# Patient Record
Sex: Female | Born: 1940 | Race: White | Hispanic: No | Smoking: Never smoker
Health system: Southern US, Community
[De-identification: ages and names within clinical notes are randomized; demographics above are authoritative.]

## PROBLEM LIST (undated history)

## (undated) DIAGNOSIS — Z170 Estrogen receptor positive status [ER+]: Principal | ICD-10-CM

## (undated) DIAGNOSIS — Z9189 Other specified personal risk factors, not elsewhere classified: Secondary | ICD-10-CM

## (undated) DIAGNOSIS — I639 Cerebral infarction, unspecified: Secondary | ICD-10-CM

## (undated) DIAGNOSIS — C50212 Malignant neoplasm of upper-inner quadrant of left female breast: Secondary | ICD-10-CM

## (undated) DIAGNOSIS — I10 Essential (primary) hypertension: Secondary | ICD-10-CM

## (undated) DIAGNOSIS — M1990 Unspecified osteoarthritis, unspecified site: Secondary | ICD-10-CM

## (undated) DIAGNOSIS — K219 Gastro-esophageal reflux disease without esophagitis: Secondary | ICD-10-CM

## (undated) DIAGNOSIS — E785 Hyperlipidemia, unspecified: Secondary | ICD-10-CM

## (undated) HISTORY — DX: Hyperlipidemia, unspecified: E78.5

## (undated) HISTORY — DX: Unspecified osteoarthritis, unspecified site: M19.90

## (undated) HISTORY — DX: Essential (primary) hypertension: I10

## (undated) HISTORY — DX: Other specified personal risk factors, not elsewhere classified: Z91.89

## (undated) HISTORY — DX: Cerebral infarction, unspecified: I63.9

## (undated) HISTORY — DX: Estrogen receptor positive status (ER+): Z17.0

## (undated) HISTORY — DX: Malignant neoplasm of upper-inner quadrant of left female breast: C50.212

---

## 1979-11-27 HISTORY — PX: CHOLECYSTECTOMY: SHX55

## 1998-11-26 LAB — HM MAMMOGRAPHY: HM Mammogram: NEGATIVE

## 2010-02-18 DIAGNOSIS — Z8673 Personal history of transient ischemic attack (TIA), and cerebral infarction without residual deficits: Secondary | ICD-10-CM | POA: Insufficient documentation

## 2010-02-20 ENCOUNTER — Encounter: Payer: Self-pay | Admitting: Internal Medicine

## 2010-02-20 ENCOUNTER — Inpatient Hospital Stay (HOSPITAL_COMMUNITY): Admission: EM | Admit: 2010-02-20 | Discharge: 2010-02-24 | Payer: Self-pay | Admitting: Emergency Medicine

## 2010-02-20 LAB — CONVERTED CEMR LAB: TSH: 1.943 microintl units/mL

## 2010-02-21 ENCOUNTER — Encounter (INDEPENDENT_AMBULATORY_CARE_PROVIDER_SITE_OTHER): Payer: Self-pay | Admitting: Internal Medicine

## 2010-02-21 ENCOUNTER — Ambulatory Visit: Payer: Self-pay | Admitting: Vascular Surgery

## 2010-02-21 ENCOUNTER — Encounter: Payer: Self-pay | Admitting: Internal Medicine

## 2010-02-21 LAB — CONVERTED CEMR LAB
BUN: 20 mg/dL
CO2: 28 meq/L
Calcium: 8.8 mg/dL
Chloride: 102 meq/L
Cholesterol: 295 mg/dL
Creatinine, Ser: 1.33 mg/dL
Glucose, Bld: 104 mg/dL
HCT: 36.9 %
HDL: 54 mg/dL
Hemoglobin: 13 g/dL
LDL Cholesterol: 203 mg/dL
MCV: 100.2 fL
Platelets: 172 10*3/uL
Potassium: 3.7 meq/L
RBC: 3.68 M/uL
RDW: 35.2 %
Sodium: 137 meq/L
Triglyceride fasting, serum: 188 mg/dL
WBC: 5.2 10*3/uL

## 2010-02-24 ENCOUNTER — Telehealth (INDEPENDENT_AMBULATORY_CARE_PROVIDER_SITE_OTHER): Payer: Self-pay | Admitting: *Deleted

## 2010-02-24 ENCOUNTER — Encounter: Payer: Self-pay | Admitting: Internal Medicine

## 2010-02-24 DIAGNOSIS — I639 Cerebral infarction, unspecified: Secondary | ICD-10-CM

## 2010-02-24 HISTORY — DX: Cerebral infarction, unspecified: I63.9

## 2010-02-24 LAB — CONVERTED CEMR LAB
ALT: 31 units/L
AST: 27 units/L
Albumin: 3.2 g/dL
Alkaline Phosphatase: 134 units/L
Total Bilirubin: 0.7 mg/dL
Total Protein: 6.2 g/dL

## 2010-03-01 ENCOUNTER — Encounter: Payer: Self-pay | Admitting: Interventional Radiology

## 2010-03-03 ENCOUNTER — Encounter: Payer: Self-pay | Admitting: Internal Medicine

## 2010-03-20 ENCOUNTER — Encounter: Payer: Self-pay | Admitting: Internal Medicine

## 2010-03-20 DIAGNOSIS — I10 Essential (primary) hypertension: Secondary | ICD-10-CM | POA: Insufficient documentation

## 2010-03-20 DIAGNOSIS — E785 Hyperlipidemia, unspecified: Secondary | ICD-10-CM | POA: Insufficient documentation

## 2010-03-21 ENCOUNTER — Ambulatory Visit: Payer: Self-pay | Admitting: Internal Medicine

## 2010-03-21 DIAGNOSIS — Z9189 Other specified personal risk factors, not elsewhere classified: Secondary | ICD-10-CM

## 2010-03-21 DIAGNOSIS — M129 Arthropathy, unspecified: Secondary | ICD-10-CM | POA: Insufficient documentation

## 2010-03-21 DIAGNOSIS — K279 Peptic ulcer, site unspecified, unspecified as acute or chronic, without hemorrhage or perforation: Secondary | ICD-10-CM | POA: Insufficient documentation

## 2010-03-21 HISTORY — DX: Other specified personal risk factors, not elsewhere classified: Z91.89

## 2010-04-21 ENCOUNTER — Ambulatory Visit: Payer: Self-pay | Admitting: Internal Medicine

## 2010-04-21 LAB — CONVERTED CEMR LAB
ALT: 132 units/L — ABNORMAL HIGH (ref 0–35)
AST: 93 units/L — ABNORMAL HIGH (ref 0–37)
Albumin: 3.8 g/dL (ref 3.5–5.2)
Alkaline Phosphatase: 185 units/L — ABNORMAL HIGH (ref 39–117)
Bilirubin, Direct: 0.1 mg/dL (ref 0.0–0.3)
Cholesterol: 224 mg/dL — ABNORMAL HIGH (ref 0–200)
Direct LDL: 69.9 mg/dL
HDL: 68.6 mg/dL (ref >39.00)
Total Bilirubin: 0.5 mg/dL (ref 0.3–1.2)
Total CHOL/HDL Ratio: 3
Total Protein: 6.8 g/dL (ref 6.0–8.3)
Triglycerides: 222 mg/dL — ABNORMAL HIGH (ref 0.0–149.0)
VLDL: 44.4 mg/dL — ABNORMAL HIGH (ref 0.0–40.0)

## 2010-05-11 ENCOUNTER — Ambulatory Visit: Payer: Self-pay | Admitting: Internal Medicine

## 2010-05-11 DIAGNOSIS — R740 Nonspecific elevation of levels of transaminase and lactic acid dehydrogenase [LDH]: Secondary | ICD-10-CM

## 2010-05-11 DIAGNOSIS — R7402 Elevation of levels of lactic acid dehydrogenase (LDH): Secondary | ICD-10-CM | POA: Insufficient documentation

## 2010-05-11 LAB — CONVERTED CEMR LAB
ALT: 77 units/L — ABNORMAL HIGH (ref 0–35)
AST: 46 units/L — ABNORMAL HIGH (ref 0–37)
Albumin: 4.2 g/dL (ref 3.5–5.2)
Alkaline Phosphatase: 187 units/L — ABNORMAL HIGH (ref 39–117)
Bilirubin, Direct: 0.1 mg/dL (ref 0.0–0.3)
Total Bilirubin: 0.5 mg/dL (ref 0.3–1.2)
Total Protein: 7 g/dL (ref 6.0–8.3)

## 2010-06-20 ENCOUNTER — Ambulatory Visit: Payer: Self-pay | Admitting: Internal Medicine

## 2010-06-20 ENCOUNTER — Encounter: Admission: RE | Admit: 2010-06-20 | Discharge: 2010-06-20 | Payer: Self-pay | Admitting: Internal Medicine

## 2010-06-20 LAB — CONVERTED CEMR LAB
ALT: 50 units/L — ABNORMAL HIGH (ref 0–35)
AST: 34 units/L (ref 0–37)
Albumin: 3.8 g/dL (ref 3.5–5.2)
Alkaline Phosphatase: 172 units/L — ABNORMAL HIGH (ref 39–117)
Bilirubin, Direct: 0.1 mg/dL (ref 0.0–0.3)
Cholesterol: 401 mg/dL — ABNORMAL HIGH (ref 0–200)
Direct LDL: 119.5 mg/dL
HDL: 66.2 mg/dL (ref >39.00)
Total Bilirubin: 0.6 mg/dL (ref 0.3–1.2)
Total CHOL/HDL Ratio: 6
Total Protein: 6.9 g/dL (ref 6.0–8.3)
Triglycerides: 424 mg/dL — ABNORMAL HIGH (ref 0.0–149.0)
VLDL: 84.8 mg/dL — ABNORMAL HIGH (ref 0.0–40.0)

## 2010-09-26 ENCOUNTER — Ambulatory Visit: Payer: Self-pay | Admitting: Internal Medicine

## 2010-09-26 DIAGNOSIS — R609 Edema, unspecified: Secondary | ICD-10-CM | POA: Insufficient documentation

## 2010-12-17 ENCOUNTER — Encounter: Payer: Self-pay | Admitting: Interventional Radiology

## 2010-12-26 NOTE — Assessment & Plan Note (Signed)
Summary: 3-4 MTH FU---STC   Vital Signs:  Patient profile:   70 year old female Height:      64.5 inches (163.83 cm) Weight:      157.4 pounds (71.55 kg) O2 Sat:      97 % on Room air Temp:     98.2 degrees F (36.78 degrees C) oral Pulse rate:   69 / minute BP sitting:   132 / 70  (left arm) Cuff size:   regular  Vitals Entered By: Tomma Lightning RMA (September 26, 2010 10:20 AM)  O2 Flow:  Room air CC: 3 month follow-up Is Patient Diabetic? No Pain Assessment Patient in pain? no      Comments Pt requesting a script for amlodipine 5mg    Primary Care Provider:  Rowe Clack MD  CC:  3 month follow-up.  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 reduced amlodipine due to new ankle edema in 05/2010 - improved but not resolved; otherwise, denies adverse side effects related to current therapy - does note freq nocturia (takes all meds at bedtime)  dyslipidemia -off medical treatment due to inc LFTs 03/2010 - see above - no GI or Mskel c/o  Clinical Review Panels:  Lipid Management   Cholesterol:  401 (06/20/2010)   LDL (bad choesterol):  203 (02/21/2010)   HDL (good cholesterol):  66.20 (06/20/2010)   Triglycerides:  188 (02/21/2010)  CBC  WBC:  5.2 (02/21/2010)   RBC:  3.68 (02/21/2010)   Hgb:  13.0 (02/21/2010)   Hct:  36.9 (02/21/2010)   Platelets:  172 (02/21/2010)   MCV  100.2 (02/21/2010)   RDW  35.2 (02/21/2010)  Complete Metabolic Panel   Glucose:  104 (02/21/2010)   Sodium:  137 (02/21/2010)   Potassium:  3.7 (02/21/2010)   Chloride:  102 (02/21/2010)   CO2:  28 (02/21/2010)   BUN:  20 (02/21/2010)   Creatinine:  1.33 (02/21/2010)   Albumin:  3.8 (06/20/2010)   Total Protein:  6.9 (06/20/2010)   Calcium:  8.8 (02/21/2010)   Total Bili:  0.6 (06/20/2010)   Alk Phos:  172 (06/20/2010)   SGPT (ALT):  50 (06/20/2010)   SGOT (AST):  34 (06/20/2010)   Current Medications (verified): 1)  Amlodipine Besylate 10 Mg Tabs (Amlodipine Besylate) .... Take 1/2 Tab By Mouth Once Daily 2)  Aspirin 81 Mg Tabs (Aspirin) .... Once Daily 3)  Plavix 75 Mg Tabs (Clopidogrel Bisulfate) .... Take 1 By Mouth Once Daily 4)  Diovan Hct 160-12.5 Mg Tabs (Valsartan-Hydrochlorothiazide) .... Take 1 By Mouth  Every Morning  Allergies (verified): 1)  ! 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    Physical Exam  General:  alert, well-developed, well-nourished, and cooperative to examination.   dtr at side Lungs:  normal respiratory effort, no intercostal retractions or use of accessory muscles; normal breath sounds bilaterally - no crackles and no wheezes.    Heart:  normal rate, regular rhythm, no murmur, and no rub. BLE with trace ankle/foot edema.     Impression & Recommendations:  Problem # 1:  EDEMA ([id number])  may be related to amlodipine - improved with reduced dose  ok to use furosemide as needed -new erx done -  also rec compression hose Her updated medication list for this problem includes:    Diovan Hct 160-12.5 Mg Tabs (Valsartan-hydrochlorothiazide) .Marland Kitchen... Take 1 by mouth  every morning    Furosemide 20 Mg Tabs (Furosemide) .Marland Kitchen... 1 by mouth once daily as needed for leg swelling  Discussed elevation of the legs, use of compression stockings, sodium restiction, and medication use.   Orders: Prescription Created Electronically [phone])  Problem # 2:  TRANSAMINASES, SERUM, ELEVATED (ICD-790.4)  Orders: TLB-Hepatic/Liver Function Pnl ([id number])  >3x rise in ast/alt between 02/24/10 and 04/21/10 recheck; improved 6/16 and 7/24 trend -  off simva since 04/21/10 due to same - no prior hx liver abn or symptoms - abd US7/2011: mild fatty liver, s/p chole, no other abn recheck labs next OV - offered and declines today if normal --rec to purse different statin..but pt feels she will decline med tx same. see next Time spent with patient/dtr 25 minutes, more than 50% of this time was spent counseling patient on lab trends, med effects and options for tx in light of CVA hx -  Problem # 3:  HYPERLIPIDEMIA (ICD-272.4)  plan recheck chol next OV reviewed change in lfts as above - off simva due to same - (note also on amlodipine in light of interval fda warnings since last OV) had marked improvement in LDL and total chol while on statin tx - reverse trend off meds reviewed. ?lovastatin or atorvastatin low dose trial warranted given recent CVA 02/2010 and need for aggressive secondary tx vs risk liver abn -  Labs Reviewed: SGOT: 27 (02/24/2010)   SGPT: 31 (02/24/2010)   HDL:54 (02/21/2010)  LDL:203 (02/21/2010)  Chol:295 (02/21/2010)  Trig:188 (02/21/2010)  Labs Reviewed: SGOT: 93 (04/21/2010)   SGPT: 132 (04/21/2010)   HDL:68.60 (04/21/2010), 54 (02/21/2010)  LDL: 68 (04/21/2010), 203 (02/21/2010),  Chol:224 (04/21/2010), 295 (02/21/2010)  Trig:222.0 (04/21/2010), 188 (02/21/2010)  Labs Reviewed: SGOT:  46 (05/11/2010)   SGPT: 77 (05/11/2010)   HDL:68.60 (04/21/2010), 54 (02/21/2010)  LDL:203 (02/21/2010)  Chol:224 (04/21/2010), 295 (02/21/2010)  Trig:222.0 (04/21/2010), 188 (02/21/2010)  Labs Reviewed: SGOT: 34 (06/20/2010)   SGPT: 50 (06/20/2010)   HDL:66.20 (06/20/2010), 68.60 (04/21/2010)  LDL:203 (02/21/2010)  Chol:401 (06/20/2010), 224 (04/21/2010)  Trig:424.0 Triglyceride is over 400; calculations on Lipids are invalid. mg/dL (06/20/2010), 222.0 (04/21/2010)  Problem # 4:  HYPERTENSION (ICD-401.9)  Her updated medication list for this problem includes:    Amlodipine Besylate 5 Mg Tabs (Amlodipine besylate) .Marland Kitchen... 1 by mouth once daily    Diovan Hct 160-12.5 Mg Tabs (Valsartan-hydrochlorothiazide) .Marland Kitchen... Take 1 by mouth  every morning    Furosemide 20 Mg Tabs (Furosemide) .Marland Kitchen... 1 by mouth once daily as needed for leg swelling  reduced amlodipine 05/2010 due to new edema -   BP today: 132/70 Prior BP: 128/62 (06/20/2010)  Labs Reviewed: K+: 3.7 (02/21/2010) Creat: : 1.33 (02/21/2010)   Chol: 401 (06/20/2010)   HDL: 66.20 (06/20/2010)   LDL: 203 (02/21/2010)   TG: 424.0 Triglyceride is over 400; calculations on Lipids are invalid. mg/dL (06/20/2010)  Complete Medication List: 1)  Amlodipine Besylate 5 Mg Tabs (Amlodipine besylate) .Marland Kitchen.. 1 by mouth once daily 2)  Aspirin 81 Mg Tabs (Aspirin) .... Once daily 3)  Plavix 75 Mg Tabs (Clopidogrel bisulfate) .... Take 1 by mouth once daily 4)  Diovan Hct 160-12.5 Mg Tabs (Valsartan-hydrochlorothiazide) .... Take 1 by mouth  every morning 5)  Claritin 10 Mg Tabs (Loratadine) .Marland Kitchen.. 1 by mouth once daily 6)  Furosemide 20 Mg Tabs (Furosemide) .Marland Kitchen.. 1 by mouth once daily as needed for leg swelling  Patient Instructions: 1)  it was good to see you today.  2)  tests and medications all reviewed today - copy of July labs given to you to revuiew cholesterol trend OFF cholesterol medications 3)  continue amlodipine 5mg  daily and use furosemide as  needed for ankle swelling - your prescriptions + refills have been electronically submitted to your pharmacy. Please take as directed. Contact our office if you believe you're having problems with the medication(s). 4)  also recommend using compression hose for leg and ankle swelling, especially during travel 5)  Please keep follow-up appointment 3-4 months, sooner if problems. will check liver tests and cholesterol  next visit so come fasting Prescriptions: PLAVIX 75 MG TABS (CLOPIDOGREL BISULFATE) take 1 by mouth once daily  #90 x 3   Entered and Authorized by:   Rowe Clack MD   Signed by:   Rowe Clack MD on 09/26/2010   Method used:   Electronically to        [facility] (retail)       [address] [place]  [address]       Ph: [phone]       Fax: [phone]   RxID:   [id number] FUROSEMIDE 20 MG TABS (FUROSEMIDE) 1 by mouth once daily as needed for leg swelling  #30 x 1   Entered and Authorized by:   Rowe Clack MD   Signed by:   Rowe Clack MD on 09/26/2010   Method used:   Electronically to        [facility] (retail)       [address] [place] [address]  [address]       Ph: [phone]       Fax: [phone]   RxID:   [id number] AMLODIPINE BESYLATE 5 MG TABS (AMLODIPINE BESYLATE) 1 by mouth once daily  #90 x 3   Entered and Authorized by:   Rowe Clack MD   Signed by:   Rowe Clack MD on 09/26/2010   Method used:   Electronically to        [facility] (retail)       [address] [place] [address]  [address]       Ph: [phone]       Fax: [phone]   RxID:   [id number]    Orders Added: 1)  Est. Patient Level IV [id number] 2)  Prescription Created Electronically [phone]

## 2010-12-26 NOTE — Assessment & Plan Note (Signed)
Summary: NEW MEDICARE PT--PKG--POST HOSP-DISCH'D 02/24/10-STROKE--STC   Vital Signs:  Patient profile:   70 year old female Height:      64.5 inches (163.83 cm) Weight:      152.2 pounds (69.18 kg) BMI:     25.81 O2 Sat:      95 % on Room air Temp:     97.4 degrees F (36.33 degrees C) oral Pulse rate:   70 / minute BP sitting:   136 / 68  (left arm) Cuff size:   regular  Vitals Entered By: Tomma Lightning (March 21, 2010 10:49 AM)  O2 Flow:  Room air CC: New patient/ D/C from hops 02/24/10 Is Patient Diabetic? No Pain Assessment Patient in pain? no        Primary Care Provider:  Rowe Clack MD  CC:  New patient/ D/C from hops 02/24/10.  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 denies adverse side effects related to current therapy does note freq nocturia (takes allmeds at bedtime).   3) dyslipidemia -reports compliance with ongoing medical treatment and no changes in medication dose or frequency. denies adverse side effects related to current therapy. no GI or Mskel c/o  Preventive Screening-Counseling & Management  Alcohol-Tobacco     Alcohol drinks/day: 0     Alcohol Counseling: not indicated; patient does not drink     Smoking Status: never     Tobacco Counseling: not indicated; no tobacco use  Caffeine-Diet-Exercise     Caffeine Counseling: not indicated; caffeine use is not excessive or problematic     Nutrition Referrals: no     Does Patient Exercise: yes     Type  of exercise: dance, gym, gardening     Exercise Counseling: not indicated; exercise is adequate     Depression Counseling: not indicated; screening negative for depression  Safety-Violence-Falls     Seat Belt Counseling: not indicated; patient wears seat belts     Helmet Counseling: not indicated; patient wears helmet when riding bicycle/motocycle     Firearms in the Home: no firearms in the home     Firearm Counseling: not applicable     Smoke Detector Counseling: no     Violence Counseling: not indicated; no violence risk noted     Fall Risk Counseling: not indicated; no significant falls noted  Clinical Review Panels:  Prevention   Last Mammogram:  No specific mammographic evidence of malignancy.   (11/26/1998)  Lipid Management   Cholesterol:  295 (02/21/2010)   LDL (bad choesterol):  203 (02/21/2010)   HDL (good cholesterol):  54 (02/21/2010)   Triglycerides:  188 (02/21/2010)  CBC   WBC:  5.2 (02/21/2010)   RBC:  3.68 (02/21/2010)   Hgb:  13.0 (02/21/2010)   Hct:  36.9 (02/21/2010)   Platelets:  172 (02/21/2010)   MCV  100.2 (02/21/2010)   RDW  35.2 (02/21/2010)  Complete Metabolic Panel   Glucose:  104 (02/21/2010)   Sodium:  137 (02/21/2010)   Potassium:  3.7 (02/21/2010)   Chloride:  102 (02/21/2010)   CO2:  28 (02/21/2010)   BUN:  20 (02/21/2010)   Creatinine:  1.33 (02/21/2010)   Albumin:  3.2 (02/24/2010)   Total Protein:  6.2 (02/24/2010)   Calcium:  8.8 (02/21/2010)   Total Bili:  0.7 (02/24/2010)   Alk Phos:  134 (02/24/2010)   SGPT (ALT):  31 (02/24/2010)   SGOT (AST):  27 (02/24/2010)   Current Medications (verified): 1)  Amlodipine Besylate 10 Mg Tabs (Amlodipine Besylate) .... Take 1 By Mouth Qd 2)  Aspirin 81 Mg Tabs (Aspirin) .... Once Daily 3)  Plavix 75 Mg Tabs (Clopidogrel Bisulfate) .... Take 1 By Mouth Qd 4)  Zocor 40 Mg Tabs (Simvastatin) .... Take 1 At Bedtime 5)  Diovan Hct 160-12.5 Mg Tabs (Valsartan-Hydrochlorothiazide) .... 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 I have reviewed all other systems and they were negative.   Physical Exam  General:  alert, well-developed, well-nourished, and cooperative to examination.   dtr and friend at side Eyes:  vision grossly intact; pupils equal, round and reactive to light.  conjunctiva and lids normal.   wears glasses Ears:  normal pinnae bilaterally, without erythema, swelling, or tenderness to palpation. TMs clear, without effusion, or cerumen impaction. Hearing grossly normal bilaterally  Mouth:  teeth and gums in good repair (upper partial); mucous membranes moist, without lesions or ulcers. oropharynx clear without exudate, no erythema.  Neck:  supple, full ROM, no masses, no thyromegaly; no thyroid nodules or tenderness. no JVD or carotid bruits.   Lungs:  normal respiratory effort, no intercostal retractions or use of accessory muscles; normal breath sounds bilaterally - no crackles and no wheezes.    Heart:  normal rate, regular rhythm, no murmur, and no rub. BLE without edema. normal DP pulses and normal cap refill in all 4 extremities    Abdomen:  soft, non-tender, normal bowel sounds, no distention; no masses and no appreciable hepatomegaly or splenomegaly.     Genitalia:  defer  Msk:  diffuse PIP OA changes of B hands - no acute effusions Neurologic:  alert & oriented X3 and cranial nerves II-XII symetrically intact.  strength normal in all extremities, sensation intact to light touch, and gait normal. speech fluent without dysarthria or aphasia; follows commands with good comprehension.  Skin:  no rashes, vesicles, ulcers, or erythema. No nodules or irregularity to palpation.  Psych:  Oriented X3, memory intact for recent and remote, normally interactive, good eye contact, not anxious appearing, not depressed appearing, and not agitated.      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 Once daily    Plavix 75 Mg Tabs (Clopidogrel bisulfate) .Marland Kitchen... Take 1 by mouth once daily  Problem # 2:  HYPERTENSION (ICD-401.9)  home BP log reviewed - good control change timing of diuretic containing med to AM due to nocturia symptoms -  Her updated medication list for this problem includes:    Amlodipine Besylate 10 Mg Tabs (Amlodipine besylate) .Marland Kitchen... Take 1 by mouth once daily    Diovan Hct 160-12.5 Mg Tabs (Valsartan-hydrochlorothiazide) .Marland Kitchen... Take 1 by mouth  every morning  BP today: 136/68  Labs Reviewed: K+: 3.7 (02/21/2010) Creat: : 1.33 (02/21/2010)   Chol: 295 (02/21/2010)   HDL: 54 (02/21/2010)   LDL: 203 (02/21/2010)   TG: 188 (02/21/2010)  Problem  # 3:  HYPERLIPIDEMIA (ICD-272.4) will recheck FLP and LFTs in 2-6 weeks given recent initiation of new meds to monitor for effects of tx hold fish oil at this time as discussed Her updated medication list for this problem includes:    Zocor 40 Mg Tabs (Simvastatin) .Marland Kitchen... Take 1 at bedtime  Labs Reviewed: SGOT: 27 (02/24/2010)   SGPT: 31 (02/24/2010)   HDL:54 (02/21/2010)  LDL:203 (02/21/2010)  Chol:295 (02/21/2010)  Trig:188 (02/21/2010)  Complete Medication List: 1)  Amlodipine Besylate 10 Mg Tabs (Amlodipine besylate) .... Take 1 by mouth once daily 2)  Aspirin 81 Mg Tabs (Aspirin) .... Once daily 3)  Plavix 75 Mg Tabs (Clopidogrel bisulfate) .... Take 1 by mouth once daily 4)  Zocor 40 Mg Tabs (Simvastatin) .... Take 1 at bedtime 5)  Diovan Hct 160-12.5 Mg Tabs (Valsartan-hydrochlorothiazide) .... Take 1 by mouth  every morning  Contraindications/Deferment of Procedures/Staging:    Test/Procedure: Colonoscopy    Reason for deferment: patient declined     Test/Procedure: Mammogram    Reason for deferment: patient declined     Test/Procedure: PAP Smear    Reason for deferment: patient declined     Test/Procedure: FLU VAX    Reason for deferment: patient declined     Test/Procedure: Pneumovax vaccine    Reason for deferment: patient declined     Test/Procedure: Zoster vaccine    Reason for deferment: declined   Patient Instructions: 1)  it was good to see you today.  2)  hospital course, tests and medications all reviewed today 3)  no medication changes but would take Diovan HCT in AM - 4)  90d refills sent on all medications (except diovan HCT) as requested 5)  Return in AM fasting for labs only in 4 weeks: lipids (272.4) and LFTs (v58.69) - your results will be posted on the phone tree for review in 48-72 hours from the time of test completion; call [phone] and enter your 9 digit MRN (listed above on this page, just below your name); if any changes need to be made or  there are abnormal results, you will be contacted directly.  6)  Please schedule a follow-up appointment in 3-4 months for recheck, sooner if problems.  Prescriptions: ZOCOR 40 MG TABS (SIMVASTATIN) take 1 at bedtime  #90 x 3   Entered and Authorized  by:   Rowe Clack MD   Signed by:   Rowe Clack MD on 03/21/2010   Method used:   Electronically to        [id number] * (retail)       [place]  [address]       Ph: [phone]       Fax: [phone]   RxID:   [id number] PLAVIX 75 MG TABS (CLOPIDOGREL BISULFATE) take 1 by mouth once daily  #90 x 3   Entered and Authorized by:   Rowe Clack MD   Signed by:   Rowe Clack MD on 03/21/2010   Method used:   Electronically to        [id number] * ([place]ail)       [place]  [address]       Ph: [phone]       Fax: [phone]   [id number] AMLODIPINE BESYLATE 10 MG TABS (AMLODIPINE BESYLATE) take 1 by mouth once daily  #90 x 3   Entered and Authorized by:   Rowe Clack MD   Signed by:   Rowe Clack MD on 03/21/2010   Method used:   Electronically to        [id number] * (retail)       [place]  [address]       Ph: [phone]       Fax: [phone]   [id number]    Mammogram  Procedure date:  11/26/1998  Findings:      No specific mammographic evidence of malignancy.

## 2010-12-26 NOTE — Assessment & Plan Note (Signed)
Summary: 3-4 MTH FU  STC   Vital Signs:  Patient profile:   70 year old female Height:      64.5 inches (163.83 cm) Weight:      155.4 pounds (70.64 kg) O2 Sat:      96 % on Room air Temp:     97.8 degrees F (36.56 degrees C) oral Pulse rate:   52 / minute BP sitting:   128 / 62  (left arm) Cuff size:   regular  Vitals Entered By: Tomma Lightning RMA (June 20, 2010 10:42 AM)  O2 Flow:  Room air CC: 3 month follow-up Is Patient Diabetic? No Pain Assessment Patient in pain? no        Primary Care Provider:  Rowe Clack MD  CC:  3 month follow-up.  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 reports new ankle edema in past 6 weeks - otherwise, denies adverse side effects related to current therapy - does note freq nocturia (takes all meds at bedtime).   dyslipidemia -off medical treatment due to inc LFTs - see above - no GI or Mskel c/o  Clinical Review Panels:  Lipid Management   Cholesterol:  224 (04/21/2010)   LDL (bad choesterol):  203 (02/21/2010)   HDL (good cholesterol):  68.60 (04/21/2010)   Triglycerides:  188 (02/21/2010)  CBC   WBC:  5.2 (02/21/2010)   RBC:  3.68 (02/21/2010)   Hgb:  13.0 (02/21/2010)   Hct:  36.9 (02/21/2010)   Platelets:  172 (02/21/2010)   MCV  100.2 (02/21/2010)   RDW  35.2  (02/21/2010)  Complete Metabolic Panel   Glucose:  104 (02/21/2010)   Sodium:  137 (02/21/2010)   Potassium:  3.7 (02/21/2010)   Chloride:  102 (02/21/2010)   CO2:  28 (02/21/2010)   BUN:  20 (02/21/2010)   Creatinine:  1.33 (02/21/2010)   Albumin:  4.2 (05/11/2010)   Total Protein:  7.0 (05/11/2010)   Calcium:  8.8 (02/21/2010)   Total Bili:  0.5 (05/11/2010)   Alk Phos:  187 (05/11/2010)   SGPT (ALT):  77 (05/11/2010)   SGOT (AST):  46 (05/11/2010)   Current Medications (verified): 1)  Amlodipine Besylate 10 Mg Tabs (Amlodipine Besylate) .... Take 1 By Mouth Once Daily 2)  Aspirin 81 Mg Tabs (Aspirin) .... Once Daily 3)  Plavix 75 Mg Tabs (Clopidogrel Bisulfate) .... Take 1 By Mouth Once Daily 4)  Diovan Hct 160-12.5 Mg Tabs (Valsartan-Hydrochlorothiazide) .... Take 1 By Mouth  Every Morning  Allergies (verified): 1)  ! Zocor (Simvastatin)  Past History:  Past Medical History: Hyperlipidemia Hypertension Peptic ulcer disease, remote Cerebrovascular accident, - R occipial 02/20/2010 - no residual deficits   MD roster: neuro - sethi  Review of Systems       The  patient complains of peripheral edema.  The patient denies vision loss, chest pain, and headaches.    Physical Exam  General:  alert, well-developed, well-nourished, and cooperative to examination.   dtr at side Lungs:  normal respiratory effort, no intercostal retractions or use of accessory muscles; normal breath sounds bilaterally - no crackles and no wheezes.    Heart:  normal rate, regular rhythm, no murmur, and no rub. BLE with 1+ ankle/foot edema.   Neurologic:  alert & oriented X3 and cranial nerves II-XII symetrically intact.  strength normal in all extremities, sensation intact to light touch, and gait normal. speech fluent without dysarthria or aphasia; follows commands with good comprehension.  Psych:  Oriented X3, memory intact for recent and remote, normally interactive, good eye contact, not  anxious appearing, not depressed appearing, and not agitated.      Impression & Recommendations:  Problem # 1:  TRANSAMINASES, SERUM, ELEVATED (ICD-790.4)  Orders: TLB-Hepatic/Liver Function Pnl ([id number])  >3x rise in ast/alt between 02/24/10 and 04/21/10 recheck; improved 6/16 trend -  off simva since 5/27 due to same - no prior hx liver abn or symptoms - abd [place] today - mild fatty liver, s/p chole, no other abn recheck labs today if normal -- likely purse different statin... see next Time spent with patient/dtr 25 minutes, more than 50% of this time was spent counseling patient on lab changes, med effects and options for tx in light of recent CVA -  Problem # 2:  HYPERLIPIDEMIA (ICD-272.4)  Orders: TLB-Lipid Panel (80061-LIPID)  change in lfts as above - off simva due to same - (note also on amlodipine in light of interval fda warnings since last OV) had marked improvement in LDL and total chol while on statin tx. ?lovastatin or atorvastatin low dose trial warranted given recent CVA 02/2010 and need for aggressive secondary tx vs risk liver abn -  Labs Reviewed: SGOT: 27 (02/24/2010)   SGPT: 31 (02/24/2010)   HDL:54 (02/21/2010)  LDL:203 (02/21/2010)  Chol:295 (02/21/2010)  Trig:188 (02/21/2010)  Labs Reviewed: SGOT: 93 (04/21/2010)   SGPT: 132 (04/21/2010)   HDL:68.60 (04/21/2010), 54 (02/21/2010)  LDL: 68 (04/21/2010), 203 (02/21/2010),  Chol:224 (04/21/2010), 295 (02/21/2010)  Trig:222.0 (04/21/2010), 188 (02/21/2010)  Labs Reviewed: SGOT: 46 (05/11/2010)   SGPT: 77 (05/11/2010)   HDL:68.60 (04/21/2010), 54 (02/21/2010)  LDL:203 (02/21/2010)  Chol:224 (04/21/2010), 295 (02/21/2010)  Trig:222.0 (04/21/2010), 188 (02/21/2010)  Problem # 3:  HYPERTENSION (ICD-401.9)  reduce amlodipine due to new edema -  Her updated medication list for this problem includes:    Amlodipine Besylate 10 Mg Tabs (Amlodipine besylate) .Marland Kitchen... Take 1/2 tab by mouth once daily    Diovan Hct  160-12.5 Mg Tabs (Valsartan-hydrochlorothiazide) .Marland Kitchen... Take 1 by mouth  every morning  BP today: 128/62 Prior BP: 120/62 (05/11/2010)  Labs Reviewed: K+: 3.7 (02/21/2010) Creat: : 1.33 (02/21/2010)   Chol: 224 (04/21/2010)   HDL: 68.60 (04/21/2010)   LDL: 203 (02/21/2010)   TG: 222.0 (04/21/2010)  Complete Medication List: 1)  Amlodipine Besylate 10 Mg Tabs (Amlodipine besylate) .... Take 1/2 tab by mouth once daily 2)  Aspirin 81 Mg Tabs (Aspirin) .... Once daily 3)  Plavix 75 Mg Tabs (Clopidogrel bisulfate) .... Take 1 by mouth once daily 4)  Diovan Hct 160-12.5 Mg Tabs (Valsartan-hydrochlorothiazide) .... Take 1 by mouth  every morning  Patient Instructions: 1)  it was good to see you today.  2)  tests and medications all reviewed today 3)  reduce amlodipine by 1/2 - take 5mg   daily to help reduce ankle swelling 4)  labs -liver tests and cholesterol today - you will be contacted directly with these results and plans for treatment advise based on these results.  5)  Please keep follow-up appointment 3-4 months, sooner if problems.  Prescriptions: DIOVAN HCT 160-12.5 MG TABS (VALSARTAN-HYDROCHLOROTHIAZIDE) take 1 by mouth  every morning  #90 x 2   Entered and Authorized by:   Rowe Clack MD   Signed by:   Rowe Clack MD on 06/20/2010   Method used:   Electronically to        [facility] (retail)       [address] [place]  [address]       Ph: [phone]       Fax: [phone]   RxID:   [id number]

## 2010-12-26 NOTE — Letter (Signed)
Summary: [facility]  [facility]   Imported By: Bubba Hales 03/23/2010 09:59:30  _____________________________________________________________________  External Attachment:    Type:   Image     Comment:   External Document

## 2010-12-26 NOTE — Progress Notes (Signed)
----   Converted from flag ---- ---- 02/24/2010 3:55 PM, Rowe Clack MD wrote: ok to work in sooner (week after next?) -  thanks Gave pt appt:  03/08/10 @ 1p w/Dr Leschber--phone/dau/Barbara ---- 02/24/2010 3:11 PM, Lucienne Capers wrote: Dr Asa Lente, June Leap is a new medicare/stroke- post hosp pt of yours that need to be seen in 2-3 wks.  I put  her in 04/13/10 which was your next availability.  Pt is requesting sooner because of the 2-3 wks post hosp fu.  Her hospital doctors referred her to you.  Do you want her work in sooner.  Thank you for your reply. ------------------------------

## 2010-12-26 NOTE — Assessment & Plan Note (Signed)
Summary: per dahlia sched-lab fu--stc   Vital Signs:  Patient profile:   70 year old female Height:      64.5 inches (163.83 cm) Weight:      152.8 pounds (69.45 kg) O2 Sat:      96 % on Room air Temp:     97.2 degrees F (36.22 degrees C) oral Pulse rate:   66 / minute BP sitting:   120 / 62  (left arm) Cuff size:   regular  Vitals Entered By: Tomma Lightning (May 11, 2010 10:53 AM)  O2 Flow:  Room air CC: f/u on labs Is Patient Diabetic? No Pain Assessment Patient in pain? no        Primary Care Provider:  Rowe Clack MD  CC:  f/u on labs.  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 denies adverse side effects related to current therapy does note freq nocturia (takes all meds at bedtime).   3) dyslipidemia -reports compliance with ongoing medical treatment and no changes in medication dose or frequency. denies adverse side effects related to current therapy. no GI or Mskel c/o  Clinical Review Panels:  Lipid Management   Cholesterol:  224 (04/21/2010)   LDL (bad choesterol):  203 (02/21/2010)   HDL (good cholesterol):  68.60 (04/21/2010)   Triglycerides:  188 (02/21/2010)  CBC   WBC:  5.2 (02/21/2010)   RBC:  3.68 (02/21/2010)   Hgb:  13.0 (02/21/2010)   Hct:  36.9 (02/21/2010)   Platelets:  172  (02/21/2010)   MCV  100.2 (02/21/2010)   RDW  35.2 (02/21/2010)  Complete Metabolic Panel   Glucose:  104 (02/21/2010)   Sodium:  137 (02/21/2010)   Potassium:  3.7 (02/21/2010)   Chloride:  102 (02/21/2010)   CO2:  28 (02/21/2010)   BUN:  20 (02/21/2010)   Creatinine:  1.33 (02/21/2010)   Albumin:  3.8 (04/21/2010)   Total Protein:  6.8 (04/21/2010)   Calcium:  8.8 (02/21/2010)   Total Bili:  0.5 (04/21/2010)   Alk Phos:  185 (04/21/2010)   SGPT (ALT):  132 (04/21/2010)   SGOT (AST):  93 (04/21/2010)   Current Medications (verified): 1)  Amlodipine Besylate 10 Mg Tabs (Amlodipine Besylate) .... Take 1 By Mouth Once Daily 2)  Aspirin 81 Mg Tabs (Aspirin) .... Once Daily 3)  Plavix 75 Mg Tabs (Clopidogrel Bisulfate) .... Take 1 By Mouth Once Daily 4)  Zocor 40 Mg Tabs (Simvastatin) .... Take 1 At Bedtime -Hold 5)  Diovan Hct 160-12.5 Mg Tabs (Valsartan-Hydrochlorothiazide) .... Take 1 By Mouth  Every Morning  Allergies (verified): 1)  ! Zocor (Simvastatin)  Past History:  Past Medical History: Hyperlipidemia Hypertension Peptic ulcer disease, remote Cerebrovascular accident, - R occipial 02/20/2010 - no residual deficits  MD roster: neuro -  sethi  Review of Systems  The patient denies anorexia, chest pain, headaches, abdominal pain, muscle weakness, and difficulty walking.    Physical Exam  General:  alert, well-developed, well-nourished, and cooperative to examination.   dtr at side Lungs:  normal respiratory effort, no intercostal retractions or use of accessory muscles; normal breath sounds bilaterally - no crackles and no wheezes.    Heart:  normal rate, regular rhythm, no murmur, and no rub. BLE without edema.   Abdomen:  soft, non-tender, normal bowel sounds, no distention; no masses and no appreciable hepatomegaly or splenomegaly.   Neurologic:  alert & oriented X3 and cranial nerves II-XII symetrically intact.  strength normal in all extremities, sensation  intact to light touch, and gait normal. speech fluent without dysarthria or aphasia; follows commands with good comprehension.    Impression & Recommendations:  Problem # 1:  TRANSAMINASES, SERUM, ELEVATED (ICD-790.4) >3x rise in ast/alt between 02/24/10 and 04/21/10 recheck -  now off simva due to same - no prior hx liver abn or symptoms  recheck today if elev, pursue further eval if normal -- likely purse different statin... 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 Once daily    Plavix 75 Mg Tabs (Clopidogrel bisulfate) .Marland Kitchen... 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 Take 1 at bedtime -hold  change in lfts as above - off simva - but had marked improvement in LDL and total chol while on statin tx. ?lovastatin or atorvastatin low dose trial warranted given recent CVA and need for aggressive secondary tx vs risk liver abn - ok to take omega 3 supplement at this time as discussed  Labs Reviewed: SGOT: 27 (02/24/2010)   SGPT: 31 (02/24/2010)    HDL:54 (02/21/2010)  LDL:203 (02/21/2010)  Chol:295 (02/21/2010)  Trig:188 (02/21/2010)  Labs Reviewed: SGOT: 93 (04/21/2010)   SGPT: 132 (04/21/2010)   HDL:68.60 (04/21/2010), 54 (02/21/2010)  LDL: 68 (04/21/2010), 203 (02/21/2010),  Chol:224 (04/21/2010), 295 (02/21/2010)  Trig:222.0 (04/21/2010), 188 (02/21/2010)  Complete Medication List: 1)  Amlodipine Besylate 10 Mg Tabs (Amlodipine besylate) .... Take 1 by mouth once daily 2)  Aspirin 81 Mg Tabs (Aspirin) .... Once daily 3)  Plavix 75 Mg Tabs (Clopidogrel bisulfate) .... Take 1 by mouth once daily 4)  Diovan Hct 160-12.5 Mg Tabs (Valsartan-hydrochlorothiazide) .... Take 1 by mouth  every morning  Patient Instructions: 1)  it was good to see you today.  2)  tests and medications all reviewed today 3)  stop simvastatin and do not take again  4)  labs -liver tests today - you will be contacted directly with these results and plans for treatment advise based on these results.  5)  Please keep follow-up appointmentas previously scheduled, sooner if problems.

## 2011-02-06 ENCOUNTER — Ambulatory Visit (INDEPENDENT_AMBULATORY_CARE_PROVIDER_SITE_OTHER): Payer: Medicare HMO | Admitting: Internal Medicine

## 2011-02-06 ENCOUNTER — Other Ambulatory Visit: Payer: Self-pay | Admitting: Internal Medicine

## 2011-02-06 ENCOUNTER — Encounter: Payer: Self-pay | Admitting: Internal Medicine

## 2011-02-06 ENCOUNTER — Other Ambulatory Visit: Payer: Medicare HMO

## 2011-02-06 DIAGNOSIS — R7402 Elevation of levels of lactic acid dehydrogenase (LDH): Secondary | ICD-10-CM

## 2011-02-06 DIAGNOSIS — E785 Hyperlipidemia, unspecified: Secondary | ICD-10-CM

## 2011-02-06 DIAGNOSIS — I10 Essential (primary) hypertension: Secondary | ICD-10-CM

## 2011-02-06 DIAGNOSIS — R740 Nonspecific elevation of levels of transaminase and lactic acid dehydrogenase [LDH]: Secondary | ICD-10-CM

## 2011-02-06 LAB — HEPATIC FUNCTION PANEL
ALT: 38 U/L — ABNORMAL HIGH (ref 0–35)
AST: 28 U/L (ref 0–37)
Albumin: 4 g/dL (ref 3.5–5.2)
Alkaline Phosphatase: 140 U/L — ABNORMAL HIGH (ref 39–117)
Bilirubin, Direct: 0 mg/dL (ref 0.0–0.3)
Total Bilirubin: 0.6 mg/dL (ref 0.3–1.2)
Total Protein: 6.9 g/dL (ref 6.0–8.3)

## 2011-02-06 LAB — LIPID PANEL
Cholesterol: 364 mg/dL — ABNORMAL HIGH (ref 0–200)
HDL: 61.5 mg/dL (ref >39.00)
Total CHOL/HDL Ratio: 6
Triglycerides: 265 mg/dL — ABNORMAL HIGH (ref 0.0–149.0)
VLDL: 53 mg/dL — ABNORMAL HIGH (ref 0.0–40.0)

## 2011-02-06 LAB — LDL CHOLESTEROL, DIRECT: Direct LDL: 149.9 mg/dL

## 2011-02-13 NOTE — Assessment & Plan Note (Signed)
Summary: 4 MTH FU/# CD   Vital Signs:  Patient profile:   70 year old female Height:      64.5 inches Weight:      150.75 pounds BMI:     25.57 O2 Sat:      96 % on Room air Temp:     97.6 degrees F oral Pulse rate:   62 / minute BP sitting:   118 / 78  (left arm) Cuff size:   regular  Vitals Entered By: Crissie Sickles, CMA (February 06, 2011 9:23 AM)  O2 Flow:  Room air CC: 4 mth follow up/DBD   Primary Care Provider:  Rowe Clack MD  CC:  4 mth follow up/DBD.  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 reduced amlodipine due to new ankle edema in 05/2010 - improved but not resolved; otherwise, denies adverse side effects related to current therapy - does note freq nocturia (takes all meds at bedtime)  dyslipidemia -off medical treatment due to inc LFTs 03/2010 - see above - no GI or Mskel c/o - working on diet, exercise and weight loss  Clinical Review Panels:  Lipid Management   Cholesterol:  401 (06/20/2010)   LDL (bad choesterol):  203 (02/21/2010)   HDL (good cholesterol):  66.20 (06/20/2010)   Triglycerides:  188 (02/21/2010)  CBC   WBC:  5.2 (02/21/2010)   RBC:  3.68 (02/21/2010)   Hgb:  13.0 (02/21/2010)   Hct:  36.9  (02/21/2010)   Platelets:  172 (02/21/2010)   MCV  100.2 (02/21/2010)   RDW  35.2 (02/21/2010)  Complete Metabolic Panel   Glucose:  104 (02/21/2010)   Sodium:  137 (02/21/2010)   Potassium:  3.7 (02/21/2010)   Chloride:  102 (02/21/2010)   CO2:  28 (02/21/2010)   BUN:  20 (02/21/2010)   Creatinine:  1.33 (02/21/2010)   Albumin:  3.8 (06/20/2010)   Total Protein:  6.9 (06/20/2010)   Calcium:  8.8 (02/21/2010)   Total Bili:  0.6 (06/20/2010)   Alk Phos:  172 (06/20/2010)   SGPT (ALT):  50 (06/20/2010)   SGOT (AST):  34 (06/20/2010)   Allergies: 1)  ! Zocor (Simvastatin)  Past History:  Past Medical History: Hyperlipidemia - inc LFTS on statin 03/2010 (normal 02/24/10 prior to statin tx) Hypertension Peptic ulcer disease, remote Cerebrovascular accident,  R occipial 02/20/2010 - no residual deficits   MD roster:  neuro - sethi   Review of Systems  The patient denies fever, weight gain, chest pain, syncope, and headaches.    Physical Exam  General:  alert, well-developed, well-nourished, and cooperative to examination.   dtr at side Lungs:  normal respiratory effort, no intercostal retractions or  use of accessory muscles; normal breath sounds bilaterally - no crackles and no wheezes.    Heart:  normal rate, regular rhythm, no murmur, and no rub. BLE with trace ankle/foot edema.   Neurologic:  alert & oriented X3 and cranial nerves II-XII symetrically intact.  strength normal in all extremities, sensation intact to light touch, and gait normal. speech fluent without dysarthria or aphasia; follows commands with good comprehension.    Impression & Recommendations:  Problem # 1:  HYPERLIPIDEMIA (ICD-272.4)  Orders: TLB-Lipid Panel (80061-LIPID)  reviewed change in lfts - off simva due to same - (note also on amlodipine in light of interval fda warnings since last OV) had marked improvement in LDL and total chol while on statin tx - reverse trend off meds  reviewed. ?lovastatin or atorvastatin low dose trial warranted given recent CVA 02/2010 and need for aggressive secondary tx vs risk liver abn -  Labs Reviewed: SGOT: 27 (02/24/2010)   SGPT: 31 (02/24/2010)   HDL:54 (02/21/2010)  LDL:203 (02/21/2010)  Chol:295 (02/21/2010)  Trig:188 (02/21/2010)  Labs Reviewed: SGOT: 93 (04/21/2010)   SGPT: 132 (04/21/2010)   HDL:68.60 (04/21/2010), 54 (02/21/2010)  LDL: 68 (04/21/2010), 203 (02/21/2010),  Chol:224 (04/21/2010), 295 (02/21/2010)  Trig:222.0 (04/21/2010), 188 (02/21/2010)  Labs Reviewed: SGOT: 46 (05/11/2010)   SGPT: 77 (05/11/2010)   HDL:68.60 (04/21/2010), 54 (02/21/2010)  LDL:203 (02/21/2010)  Chol:224 (04/21/2010), 295 (02/21/2010)  Trig:222.0 (04/21/2010), 188 (02/21/2010)  Labs Reviewed: SGOT: 34 (06/20/2010)   SGPT: 50 (06/20/2010)   HDL:66.20 (06/20/2010), 68.60 (04/21/2010)  LDL:203 (02/21/2010)  Chol:401 (06/20/2010), 224 (04/21/2010)  Trig:424.0 Triglyceride is over 400; calculations on Lipids are invalid. mg/dL (06/20/2010), 222.0 (04/21/2010)  Problem # 2:  TRANSAMINASES, SERUM, ELEVATED (ICD-790.4)  Orders: TLB-Hepatic/Liver Function Pnl (80076-HEPATIC)  >3x rise in ast/alt between 02/24/10 and 04/21/10 recheck; improved 6/16 and 7/24 trend -  off simva since 04/21/10 due to same - no prior hx liver abn or symptoms - abd US7/2011: mild fatty liver, s/p chole, no other abn recheck labs next OV - offered and declines today if normal --rec to purse different statin..but pt feels she will decline med tx same. see next Time spent with patient/dtr 25 minutes, more than 50% of this time was spent counseling patient on lab trends, med effects and options for tx in light of CVA hx -  Problem # 3:  HYPERTENSION (ICD-401.9)  Her updated medication list for this problem includes:    Amlodipine Besylate 5 Mg Tabs (Amlodipine besylate) .Marland Kitchen... 1 by mouth once daily    Diovan Hct 160-12.5 Mg Tabs (Valsartan-hydrochlorothiazide) .Marland Kitchen... Take 1  by mouth  every morning    Furosemide 20 Mg Tabs (Furosemide) .Marland Kitchen... 1 by mouth once daily as needed for leg swelling  BP today: 118/78 Prior BP: 132/70 (09/26/2010)  Labs Reviewed: K+: 3.7 (02/21/2010) Creat: : 1.33 (02/21/2010)   Chol: 401 (06/20/2010)   HDL: 66.20 (06/20/2010)   LDL: 203 (02/21/2010)   TG: 424.0 Triglyceride is over 400; calculations on Lipids are invalid. mg/dL (06/20/2010)  Complete Medication List: 1)  Amlodipine Besylate 5 Mg Tabs (Amlodipine besylate) .Marland Kitchen.. 1 by mouth once daily 2)  Aspirin 81 Mg Tabs (Aspirin) .... Once daily 3)  Plavix 75 Mg Tabs (Clopidogrel bisulfate) .... Take 1 by mouth once daily 4)  Diovan Hct 160-12.5 Mg Tabs (Valsartan-hydrochlorothiazide) .... Take 1 by mouth  every morning 5)  Claritin 10 Mg Tabs (Loratadine) .Marland Kitchen.. 1 by mouth once daily 6)  Furosemide 20 Mg Tabs (Furosemide) .Marland Kitchen.. 1 by mouth once  daily as needed for leg swelling  Patient Instructions: 1)  it was good to see you today. 2)  test(s) ordered today - your results will be called to you after review in 48-72 hours from the time of test completion 3)  no medication changes recommended today 4)  Please schedule a follow-up appointment in 6 months to monitor blood pressure and cholesterol, call sooner if problems.    Orders Added: 1)  Est. Patient Level IV [id number] 2)  TLB-Hepatic/Liver Function Pnl [80076-HEPATIC] 3)  TLB-Lipid Panel [80061-LIPID]

## 2011-02-14 LAB — HEPATIC FUNCTION PANEL
ALT: 31 U/L (ref 0–35)
AST: 27 U/L (ref 0–37)
Albumin: 3.2 g/dL — ABNORMAL LOW (ref 3.5–5.2)
Alkaline Phosphatase: 134 U/L — ABNORMAL HIGH (ref 39–117)
Bilirubin, Direct: 0.1 mg/dL (ref 0.0–0.3)
Indirect Bilirubin: 0.6 mg/dL (ref 0.3–0.9)
Total Bilirubin: 0.7 mg/dL (ref 0.3–1.2)
Total Protein: 6.2 g/dL (ref 6.0–8.3)

## 2011-02-19 LAB — URINALYSIS, ROUTINE W REFLEX MICROSCOPIC
Bilirubin Urine: NEGATIVE
Glucose, UA: NEGATIVE mg/dL
Ketones, ur: NEGATIVE mg/dL
Nitrite: POSITIVE — AB
Protein, ur: NEGATIVE mg/dL
Specific Gravity, Urine: 1.007 (ref 1.005–1.030)
Urobilinogen, UA: 0.2 mg/dL (ref 0.0–1.0)
pH: 5.5 (ref 5.0–8.0)

## 2011-02-19 LAB — LIPID PANEL
Cholesterol: 295 mg/dL — ABNORMAL HIGH (ref 0–200)
HDL: 54 mg/dL (ref >39)
LDL Cholesterol: 203 mg/dL — ABNORMAL HIGH (ref 0–99)
Total CHOL/HDL Ratio: 5.5 RATIO
Triglycerides: 188 mg/dL — ABNORMAL HIGH (ref <150)
VLDL: 38 mg/dL (ref 0–40)

## 2011-02-19 LAB — BASIC METABOLIC PANEL
BUN: 18 mg/dL (ref 6–23)
BUN: 20 mg/dL (ref 6–23)
BUN: 21 mg/dL (ref 6–23)
CO2: 25 mEq/L (ref 19–32)
CO2: 27 mEq/L (ref 19–32)
CO2: 28 mEq/L (ref 19–32)
Calcium: 8.6 mg/dL (ref 8.4–10.5)
Calcium: 8.8 mg/dL (ref 8.4–10.5)
Calcium: 9.4 mg/dL (ref 8.4–10.5)
Chloride: 102 mEq/L (ref 96–112)
Chloride: 104 mEq/L (ref 96–112)
Chloride: 109 mEq/L (ref 96–112)
Creatinine, Ser: 1.14 mg/dL (ref 0.4–1.2)
Creatinine, Ser: 1.28 mg/dL — ABNORMAL HIGH (ref 0.4–1.2)
Creatinine, Ser: 1.33 mg/dL — ABNORMAL HIGH (ref 0.4–1.2)
GFR calc Af Amer: 48 mL/min — ABNORMAL LOW (ref >60)
GFR calc Af Amer: 50 mL/min — ABNORMAL LOW (ref >60)
GFR calc Af Amer: 57 mL/min — ABNORMAL LOW (ref >60)
GFR calc non Af Amer: 40 mL/min — ABNORMAL LOW (ref >60)
GFR calc non Af Amer: 41 mL/min — ABNORMAL LOW (ref >60)
GFR calc non Af Amer: 47 mL/min — ABNORMAL LOW (ref >60)
Glucose, Bld: 100 mg/dL — ABNORMAL HIGH (ref 70–99)
Glucose, Bld: 104 mg/dL — ABNORMAL HIGH (ref 70–99)
Glucose, Bld: 95 mg/dL (ref 70–99)
Potassium: 3.5 mEq/L (ref 3.5–5.1)
Potassium: 3.7 mEq/L (ref 3.5–5.1)
Potassium: 3.8 mEq/L (ref 3.5–5.1)
Sodium: 137 mEq/L (ref 135–145)
Sodium: 140 mEq/L (ref 135–145)
Sodium: 142 mEq/L (ref 135–145)

## 2011-02-19 LAB — CBC
HCT: 36.9 % (ref 36.0–46.0)
HCT: 42.6 % (ref 36.0–46.0)
Hemoglobin: 13 g/dL (ref 12.0–15.0)
Hemoglobin: 14.7 g/dL (ref 12.0–15.0)
MCHC: 34.5 g/dL (ref 30.0–36.0)
MCHC: 35.2 g/dL (ref 30.0–36.0)
MCV: 100.2 fL — ABNORMAL HIGH (ref 78.0–100.0)
MCV: 99.7 fL (ref 78.0–100.0)
Platelets: 172 10*3/uL (ref 150–400)
Platelets: 199 10*3/uL (ref 150–400)
RBC: 3.68 MIL/uL — ABNORMAL LOW (ref 3.87–5.11)
RBC: 4.27 MIL/uL (ref 3.87–5.11)
RDW: 13 % (ref 11.5–15.5)
RDW: 13.2 % (ref 11.5–15.5)
WBC: 5.2 10*3/uL (ref 4.0–10.5)
WBC: 6.2 10*3/uL (ref 4.0–10.5)

## 2011-02-19 LAB — POCT I-STAT, CHEM 8
BUN: 21 mg/dL (ref 6–23)
Calcium, Ion: 1.04 mmol/L — ABNORMAL LOW (ref 1.12–1.32)
Chloride: 107 mEq/L (ref 96–112)
Creatinine, Ser: 1.1 mg/dL (ref 0.4–1.2)
Glucose, Bld: 92 mg/dL (ref 70–99)
HCT: 44 % (ref 36.0–46.0)
Hemoglobin: 15 g/dL (ref 12.0–15.0)
Potassium: 3.7 mEq/L (ref 3.5–5.1)
Sodium: 140 mEq/L (ref 135–145)
TCO2: 26 mmol/L (ref 0–100)

## 2011-02-19 LAB — URINE CULTURE
Colony Count: NO GROWTH
Culture: NO GROWTH

## 2011-02-19 LAB — DIFFERENTIAL
Basophils Absolute: 0.1 10*3/uL (ref 0.0–0.1)
Basophils Relative: 1 % (ref 0–1)
Eosinophils Absolute: 0.1 10*3/uL (ref 0.0–0.7)
Eosinophils Relative: 2 % (ref 0–5)
Lymphocytes Relative: 37 % (ref 12–46)
Lymphs Abs: 2.3 10*3/uL (ref 0.7–4.0)
Monocytes Absolute: 0.5 10*3/uL (ref 0.1–1.0)
Monocytes Relative: 8 % (ref 3–12)
Neutro Abs: 3.3 10*3/uL (ref 1.7–7.7)
Neutrophils Relative %: 53 % (ref 43–77)

## 2011-02-19 LAB — CARDIAC PANEL(CRET KIN+CKTOT+MB+TROPI)
CK, MB: 1.8 ng/mL (ref 0.3–4.0)
CK, MB: 1.8 ng/mL (ref 0.3–4.0)
Relative Index: 1.4 (ref 0.0–2.5)
Relative Index: 1.5 (ref 0.0–2.5)
Total CK: 118 U/L (ref 7–177)
Total CK: 133 U/L (ref 7–177)
Troponin I: 0.01 ng/mL (ref 0.00–0.06)
Troponin I: 0.03 ng/mL (ref 0.00–0.06)

## 2011-02-19 LAB — CK TOTAL AND CKMB (NOT AT ARMC)
CK, MB: 2.1 ng/mL (ref 0.3–4.0)
Relative Index: 1.4 (ref 0.0–2.5)
Total CK: 150 U/L (ref 7–177)

## 2011-02-19 LAB — URINE MICROSCOPIC-ADD ON

## 2011-02-19 LAB — TROPONIN I: Troponin I: 0.01 ng/mL (ref 0.00–0.06)

## 2011-02-19 LAB — TSH: TSH: 1.943 u[IU]/mL (ref 0.350–4.500)

## 2011-02-19 LAB — APTT: aPTT: 37 seconds (ref 24–37)

## 2011-02-19 LAB — PROTIME-INR
INR: 0.88 (ref 0.00–1.49)
Prothrombin Time: 11.9 seconds (ref 11.6–15.2)

## 2011-05-18 ENCOUNTER — Other Ambulatory Visit: Payer: Self-pay | Admitting: *Deleted

## 2011-05-18 MED ORDER — CLOPIDOGREL BISULFATE 75 MG PO TABS: 75.0000 mg | ORAL_TABLET | Freq: Every day | ORAL | Status: DC

## 2011-07-12 ENCOUNTER — Other Ambulatory Visit: Payer: Self-pay | Admitting: Internal Medicine

## 2011-08-28 ENCOUNTER — Encounter: Payer: Self-pay | Admitting: Internal Medicine

## 2011-08-28 ENCOUNTER — Other Ambulatory Visit (INDEPENDENT_AMBULATORY_CARE_PROVIDER_SITE_OTHER): Payer: Medicare HMO

## 2011-08-28 ENCOUNTER — Other Ambulatory Visit: Payer: Self-pay | Admitting: Internal Medicine

## 2011-08-28 ENCOUNTER — Ambulatory Visit (INDEPENDENT_AMBULATORY_CARE_PROVIDER_SITE_OTHER): Payer: Medicare HMO | Admitting: Internal Medicine

## 2011-08-28 DIAGNOSIS — R7402 Elevation of levels of lactic acid dehydrogenase (LDH): Secondary | ICD-10-CM

## 2011-08-28 DIAGNOSIS — I10 Essential (primary) hypertension: Secondary | ICD-10-CM

## 2011-08-28 DIAGNOSIS — Z79899 Other long term (current) drug therapy: Secondary | ICD-10-CM

## 2011-08-28 DIAGNOSIS — R5381 Other malaise: Secondary | ICD-10-CM

## 2011-08-28 DIAGNOSIS — E785 Hyperlipidemia, unspecified: Secondary | ICD-10-CM

## 2011-08-28 DIAGNOSIS — R5383 Other fatigue: Secondary | ICD-10-CM

## 2011-08-28 DIAGNOSIS — I6330 Cerebral infarction due to thrombosis of unspecified cerebral artery: Secondary | ICD-10-CM

## 2011-08-28 DIAGNOSIS — R7401 Elevation of levels of liver transaminase levels: Secondary | ICD-10-CM

## 2011-08-28 LAB — CBC WITH DIFFERENTIAL/PLATELET
Basophils Absolute: 0 10*3/uL (ref 0.0–0.1)
Basophils Relative: 0.7 % (ref 0.0–3.0)
Eosinophils Absolute: 0.2 10*3/uL (ref 0.0–0.7)
Eosinophils Relative: 3 % (ref 0.0–5.0)
HCT: 40.2 % (ref 36.0–46.0)
Hemoglobin: 13.8 g/dL (ref 12.0–15.0)
Lymphocytes Relative: 39.1 % (ref 12.0–46.0)
Lymphs Abs: 2.4 10*3/uL (ref 0.7–4.0)
MCHC: 34.4 g/dL (ref 30.0–36.0)
MCV: 97.5 fl (ref 78.0–100.0)
Monocytes Absolute: 0.5 10*3/uL (ref 0.1–1.0)
Monocytes Relative: 7.7 % (ref 3.0–12.0)
Neutro Abs: 3 10*3/uL (ref 1.4–7.7)
Neutrophils Relative %: 49.5 % (ref 43.0–77.0)
Platelets: 207 10*3/uL (ref 150.0–400.0)
RBC: 4.12 Mil/uL (ref 3.87–5.11)
RDW: 13.9 % (ref 11.5–14.6)
WBC: 6.1 10*3/uL (ref 4.5–10.5)

## 2011-08-28 LAB — LIPID PANEL
Cholesterol: 365 mg/dL — ABNORMAL HIGH (ref 0–200)
HDL: 62.4 mg/dL (ref >39.00)
Total CHOL/HDL Ratio: 6
Triglycerides: 216 mg/dL — ABNORMAL HIGH (ref 0.0–149.0)
VLDL: 43.2 mg/dL — ABNORMAL HIGH (ref 0.0–40.0)

## 2011-08-28 LAB — HEPATIC FUNCTION PANEL
ALT: 58 U/L — ABNORMAL HIGH (ref 0–35)
AST: 37 U/L (ref 0–37)
Albumin: 3.9 g/dL (ref 3.5–5.2)
Alkaline Phosphatase: 140 U/L — ABNORMAL HIGH (ref 39–117)
Bilirubin, Direct: 0 mg/dL (ref 0.0–0.3)
Total Bilirubin: 0.8 mg/dL (ref 0.3–1.2)
Total Protein: 7.5 g/dL (ref 6.0–8.3)

## 2011-08-28 LAB — BASIC METABOLIC PANEL
BUN: 23 mg/dL (ref 6–23)
CO2: 31 mEq/L (ref 19–32)
Calcium: 9 mg/dL (ref 8.4–10.5)
Chloride: 102 mEq/L (ref 96–112)
Creatinine, Ser: 1.4 mg/dL — ABNORMAL HIGH (ref 0.4–1.2)
GFR: 40.72 mL/min — ABNORMAL LOW (ref >60.00)
Glucose, Bld: 99 mg/dL (ref 70–99)
Potassium: 4.3 mEq/L (ref 3.5–5.1)
Sodium: 140 mEq/L (ref 135–145)

## 2011-08-28 LAB — VITAMIN B12: Vitamin B-12: 450 pg/mL (ref 211–911)

## 2011-08-28 LAB — LDL CHOLESTEROL, DIRECT: Direct LDL: 162.4 mg/dL

## 2011-08-28 LAB — TSH: TSH: 1.51 u[IU]/mL (ref 0.35–5.50)

## 2011-08-28 MED ORDER — CLOPIDOGREL BISULFATE 75 MG PO TABS: 75.0000 mg | ORAL_TABLET | Freq: Every day | ORAL | Status: DC

## 2011-08-28 MED ORDER — OMEGA-3 FATTY ACIDS 1000 MG PO CAPS: 2.0000 g | ORAL_CAPSULE | Freq: Every day | ORAL | Status: AC

## 2011-08-28 NOTE — Patient Instructions (Signed)
It was good to see you today. Test(s) ordered today. Your results will be called to you after review (48-72hours after test completion). If any changes need to be made, you will be notified at that time. Medications reviewed, no changes at this time. Refill on medication(s) as discussed today. Please schedule followup in 6 months to monitor cholesterol and blood pressure, call sooner if problems.

## 2011-08-28 NOTE — Progress Notes (Signed)
  Subjective:    Patient ID: Shirley Wells, female    DOB: September 08, 1941, 70 y.o.   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 reduced amlodipine due to ankle edema in 05/2010 - improved but not resolved; otherwise, denies adverse side effects related to current therapy -    dyslipidemia -off medical treatment (simva) due to >3x/increase LFTs 03/2010 - see above - no GI or Mskel - working on diet, exercise and weight loss - also omega 3   PMH reviewed   Review of Systems  Constitutional: Positive for fatigue. Negative for fever.  Respiratory: Negative for cough and shortness of breath.   Cardiovascular: Negative for chest pain and palpitations.       Objective:   Physical Exam BP 130/78  Pulse 60  Temp(Src) 97.9 F (36.6 C) (Oral)  Ht 5' 4.5" (1.638 m)  Wt 153 lb 6.4 oz (69.582 kg)  BMI 25.92 kg/m2  SpO2 95% Wt Readings from Last 3 Encounters:  08/28/11 153 lb 6.4 oz (69.582 kg)  02/06/11 150 lb 12 oz (68.38 kg)  09/26/10 157 lb 6.4 oz (71.396 kg)   Constitutional: She is fit - appears well-developed and well-nourished. No distress. dtr at side  Neck: Normal range of motion. Neck supple. No JVD present. No carotid bruits. No thyromegaly present.  Cardiovascular:  Normal rate, regular rhythm and normal heart sounds.  No murmur heard. No BLE edema. Pulmonary/Chest: Effort normal and breath sounds normal. No respiratory distress. She has no wheezes. Neurological: She is alert and oriented to person, place, and time. No cranial nerve deficit. Coordination, balance, gait and memory/recall normal. no residual deficits appreciated Skin: Skin is warm and dry. No rash noted. No erythema.  Psychiatric: She has a normal mood and affect. Her behavior is normal. Judgment and thought content normal.   Last labs reviewed      Assessment & Plan:  See problem list. Medications and labs reviewed today.

## 2011-08-29 ENCOUNTER — Encounter: Payer: Self-pay | Admitting: Internal Medicine

## 2011-08-29 NOTE — Assessment & Plan Note (Signed)
Onset 03/2010 following statin initiation 02/2010 - normal LFTs 02/2010 at time of CVA event Slow decrease trend summer 2011 off statin Recheck now to ensure resolution [place] abd 06/20/10 with mild fatty changes and s/p chole

## 2011-08-29 NOTE — Assessment & Plan Note (Signed)
BP Readings from Last 3 Encounters:  08/28/11 130/78  02/06/11 118/78  09/26/10 132/70   The current medical regimen is effective;  continue present plan and medications.

## 2011-08-29 NOTE — Assessment & Plan Note (Signed)
brief statin trial x 4 weeks following CVA 02/2010>> caused >3x LFT increase - off same since 03/2010 Diet, weight control and omega 3 - Declines repeat trial of alt statin despite significant dyslipidemia and CVA hx

## 2011-08-29 NOTE — Assessment & Plan Note (Signed)
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

## 2012-01-14 ENCOUNTER — Other Ambulatory Visit: Payer: Self-pay | Admitting: *Deleted

## 2012-01-14 ENCOUNTER — Telehealth: Payer: Self-pay

## 2012-01-14 MED ORDER — AMLODIPINE BESYLATE 5 MG PO TABS: 5.0000 mg | ORAL_TABLET | Freq: Every day | ORAL | Status: DC

## 2012-01-14 NOTE — Telephone Encounter (Signed)
A user error has taken place: encounter opened in error, closed for administrative reasons.

## 2012-01-15 ENCOUNTER — Ambulatory Visit (INDEPENDENT_AMBULATORY_CARE_PROVIDER_SITE_OTHER): Payer: Medicare HMO | Admitting: Endocrinology

## 2012-01-15 ENCOUNTER — Ambulatory Visit (INDEPENDENT_AMBULATORY_CARE_PROVIDER_SITE_OTHER)
Admission: RE | Admit: 2012-01-15 | Discharge: 2012-01-15 | Disposition: A | Discharge status: Home / Self Care | Payer: Medicare HMO | Source: Ambulatory Visit | Attending: Endocrinology | Admitting: Endocrinology

## 2012-01-15 ENCOUNTER — Encounter: Payer: Self-pay | Admitting: Endocrinology

## 2012-01-15 VITALS — BP 110/72 | HR 87 | Temp 98.1°F | Ht 64.0 in | Wt 154.0 lb

## 2012-01-15 DIAGNOSIS — R059 Cough, unspecified: Secondary | ICD-10-CM

## 2012-01-15 DIAGNOSIS — R05 Cough: Secondary | ICD-10-CM

## 2012-01-15 MED ORDER — AZITHROMYCIN 500 MG PO TABS: 500.0000 mg | ORAL_TABLET | Freq: Every day | ORAL | Status: DC

## 2012-01-15 MED ORDER — AMLODIPINE BESYLATE 5 MG PO TABS: 5.0000 mg | ORAL_TABLET | Freq: Every day | ORAL | Status: DC

## 2012-01-15 MED ORDER — CLOPIDOGREL BISULFATE 75 MG PO TABS: 75.0000 mg | ORAL_TABLET | Freq: Every day | ORAL | Status: DC

## 2012-01-15 NOTE — Patient Instructions (Addendum)
i have sent a prescription to your pharmacy, for an antibiotic.   A chest-x-ray is requested for you today.  please call [phone] to hear your test results.  You will be prompted to enter the 9-digit "MRN" number that appears at the top left of this page, followed by #.  Then you will hear the message. I hope you feel better soon.  If you don't feel better by next week, please call your doctor. Loratadine-d (non-prescription) will help your congestion. (update: i left message on phone-tree:  rx as we discussed)

## 2012-01-15 NOTE — Progress Notes (Signed)
  Subjective:    Patient ID: Shirley Wells, female    DOB: 1941-04-23, 71 y.o.   MRN: [id number]  HPI Pt states 6 weeks of slight prod-quality cough in the chest, and assoc nasal congestion.  Over this time period, she feels as though she is getting slightly better.   Past Medical History  Diagnosis Date  . Hypertension, essential   . 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 Dyslipidemia     intol of statin due to >3x increase LFTs  . Osteoarthritis     Past Surgical History  Procedure Date  . Cholecystectomy 1981    History   Social History  . Marital Status: Divorced    Spouse Name: N/A    Number of Children: N/A  . Years of Education: N/A   Occupational History  . Not on file.   Social History Main Topics  . Smoking status: Never Smoker   . Smokeless tobacco: Not on file  . Alcohol Use: Yes     Rare  . Drug Use: Not on file  . Sexually Active: Not on file   Other Topics Concern  . Not on file   Social History Narrative   Divorced  >31yrs, single and lives alone. Supportive daughters, 1 is a Therapist, sports at [employer]). She enjoys gardening, dancing and caring for her grand children.    Current Outpatient Prescriptions on File Prior to Visit  Medication Sig Dispense Refill  . amLODipine (NORVASC) 5 MG tablet Take 1 tablet (5 mg total) by mouth daily.  90 tablet  1  . aspirin 81 MG tablet Take 81 mg by mouth daily.        . clopidogrel (PLAVIX) 75 MG tablet Take 1 tablet (75 mg total) by mouth daily.  30 tablet  6  . DIOVAN HCT 160-12.5 MG per tablet TAKE 1 TABLET BY MOUTH EVERY MORNING  90 tablet  2  . fish oil-omega-3 fatty acids 1000 MG capsule Take 2 capsules (2 g total) by mouth daily.      . furosemide (LASIX) 20 MG tablet Take 20 mg by mouth daily as needed.        . loratadine (CLARITIN) 10 MG tablet Take 10 mg by mouth daily.          Allergies  Allergen Reactions    . Simvastatin     REACTION: inc LFT >3x baseline    Family History  Problem Relation Age of Onset  . Arthritis Other     BP 110/72  Pulse 87  Temp(Src) 98.1 F (36.7 C) (Oral)  Ht 5\' 4"  (1.626 m)  Wt 154 lb (69.854 kg)  BMI 26.43 kg/m2  SpO2 97%  Review of Systems Denies fever and earache.      Objective:   Physical Exam VITAL SIGNS:  See vs page GENERAL: no distress head: no deformity eyes: no periorbital swelling, no proptosis external nose and ears are normal mouth: no lesion seen Right tm is red.  Left is normal.   NECK: There is no palpable thyroid enlargement.  No thyroid nodule is palpable.  No palpable lymphadenopathy at the anterior neck. LUNGS:  Clear to auscultation.   Cxr: nad    Assessment & Plan:  Acute bronchitis, new

## 2012-01-21 ENCOUNTER — Telehealth: Payer: Self-pay | Admitting: *Deleted

## 2012-01-21 MED ORDER — AZITHROMYCIN 500 MG PO TABS: 500.0000 mg | ORAL_TABLET | Freq: Every day | ORAL | Status: AC

## 2012-01-21 NOTE — Telephone Encounter (Signed)
Okay to repeat Z-Pak - this was electronically submitted today and patient was notified of same

## 2012-01-21 NOTE — Telephone Encounter (Signed)
Pt states she saw Dr. Haywood Lasso and was rx z-pack. Still have chest & nasal congestion, coughing up yellowish phlegm. Denies fever or SOB. Requesting another round of z-pack... 01/21/12@3 :34pm/LMB

## 2012-04-01 ENCOUNTER — Ambulatory Visit: Payer: Medicare HMO | Admitting: Internal Medicine

## 2012-06-10 ENCOUNTER — Encounter: Payer: Self-pay | Admitting: Internal Medicine

## 2012-06-10 ENCOUNTER — Other Ambulatory Visit (INDEPENDENT_AMBULATORY_CARE_PROVIDER_SITE_OTHER): Payer: Medicare HMO

## 2012-06-10 ENCOUNTER — Ambulatory Visit (INDEPENDENT_AMBULATORY_CARE_PROVIDER_SITE_OTHER): Payer: Medicare HMO | Admitting: Internal Medicine

## 2012-06-10 VITALS — BP 122/68 | HR 63 | Temp 97.3°F | Ht 64.5 in | Wt 156.6 lb

## 2012-06-10 DIAGNOSIS — I10 Essential (primary) hypertension: Secondary | ICD-10-CM

## 2012-06-10 DIAGNOSIS — R7402 Elevation of levels of lactic acid dehydrogenase (LDH): Secondary | ICD-10-CM

## 2012-06-10 DIAGNOSIS — E785 Hyperlipidemia, unspecified: Secondary | ICD-10-CM

## 2012-06-10 LAB — LDL CHOLESTEROL, DIRECT: Direct LDL: 118.6 mg/dL

## 2012-06-10 LAB — HEPATIC FUNCTION PANEL
ALT: 30 U/L (ref 0–35)
AST: 29 U/L (ref 0–37)
Albumin: 3.7 g/dL (ref 3.5–5.2)
Alkaline Phosphatase: 121 U/L — ABNORMAL HIGH (ref 39–117)
Bilirubin, Direct: 0.1 mg/dL (ref 0.0–0.3)
Total Bilirubin: 0.7 mg/dL (ref 0.3–1.2)
Total Protein: 7.2 g/dL (ref 6.0–8.3)

## 2012-06-10 LAB — LIPID PANEL
Cholesterol: 359 mg/dL — ABNORMAL HIGH (ref 0–200)
HDL: 65.1 mg/dL (ref >39.00)
Total CHOL/HDL Ratio: 6
Triglycerides: 284 mg/dL — ABNORMAL HIGH (ref 0.0–149.0)
VLDL: 56.8 mg/dL — ABNORMAL HIGH (ref 0.0–40.0)

## 2012-06-10 MED ORDER — CLOPIDOGREL BISULFATE 75 MG PO TABS: 75.0000 mg | ORAL_TABLET | Freq: Every day | ORAL | Status: DC

## 2012-06-10 NOTE — Patient Instructions (Signed)
It was good to see you today. Test(s) ordered today. Your results will be called to you after review (48-72hours after test completion). If any changes need to be made, you will be notified at that time. Medications reviewed, no changes at this time. Refill on medication(s) as discussed today. Work on lifestyle changes as discussed (low fat, low carb, increased protein diet; improved exercise efforts; weight loss) to control sugar, blood pressure and cholesterol levels and/or reduce risk of developing other medical problems. Look into food journaling  to assist you in this process. Please schedule followup in 6-12 months to monitor cholesterol and blood pressure, call sooner if problems.

## 2012-06-10 NOTE — Assessment & Plan Note (Signed)
BP Readings from Last 3 Encounters:  06/10/12 122/68  01/15/12 110/72  08/28/11 130/78   The current medical regimen is effective;  continue present plan and medications.

## 2012-06-10 NOTE — Assessment & Plan Note (Signed)
Onset 03/2010 following statin initiation 02/2010 - normal LFTs 02/2010 at time of CVA event Slow decrease trend summer 2011 off statin Recheck now to ensure normal [place] abd 06/20/10 with mild fatty changes and s/p chole

## 2012-06-10 NOTE — Assessment & Plan Note (Signed)
brief statin trial x 4 weeks following CVA 02/2010>> caused >3x LFT increase - off same since 03/2010 Diet, weight control and omega 3 - Declines repeat trial of alt statin despite significant dyslipidemia and CVA hx

## 2012-06-10 NOTE — Progress Notes (Signed)
Subjective:    Patient ID: Shirley Wells, female    DOB: 1941-03-21, 71 y.o.   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 reduced amlodipine due to ankle edema in 05/2010 - improved but not resolved; otherwise, denies adverse side effects related to current therapy -    dyslipidemia -off medical treatment (simva) due to >3x/increase LFTs 03/2010 - see above - no GI or Mskel - working on diet, exercise and weight loss - also omega 3   Past Medical History  Diagnosis Date  . Hypertension, essential   . 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 Dyslipidemia     intol of statin due to >3x increase LFTs  . Osteoarthritis     Review of Systems  Constitutional: Positive for fatigue. Negative for fever.  Respiratory: Negative for cough and shortness of breath.   Cardiovascular: Negative for chest pain and palpitations.       Objective:   Physical Exam  BP 122/68  Pulse 63  Temp 97.3 F (36.3 C) (Oral)  Ht 5' 4.5" (1.638 m)  Wt 156 lb 9.6 oz (71.033 kg)  BMI 26.47 kg/m2  SpO2 97% Wt Readings from Last 3 Encounters:  06/10/12 156 lb 9.6 oz (71.033 kg)  01/15/12 154 lb (69.854 kg)    08/28/11 153 lb 6.4 oz (69.582 kg)   Constitutional: She is fit - appears well-developed and well-nourished. No distress. Neck: Normal range of motion. Neck supple. No JVD present. No carotid bruits. No thyromegaly present.  Cardiovascular: Normal rate, regular rhythm and normal heart sounds.  No murmur heard. No BLE edema. Pulmonary/Chest: Effort normal and breath sounds normal. No respiratory distress. She has no wheezes. Neurological: She is alert and oriented to person, place, and time. No cranial nerve deficit. Coordination, balance, gait and memory/recall normal. no residual deficits appreciated Skin: Skin is warm and dry. No rash noted. No erythema.  Psychiatric: She has a normal mood and affect. Her behavior is normal. Judgment and thought content normal.   Lab Results  Component Value Date   WBC 6.1 08/28/2011   HGB 13.8 08/28/2011   HCT 40.2 08/28/2011   PLT 207.0 08/28/2011   GLUCOSE 99 08/28/2011   CHOL 365* 08/28/2011   TRIG 216.0* 08/28/2011   HDL 62.40 08/28/2011   LDLDIRECT 162.4 08/28/2011   LDLCALC  Value: 203     02/21/2010   ALT 58* 08/28/2011   AST 37 08/28/2011   NA 140 08/28/2011   K 4.3 08/28/2011   CL 102 08/28/2011   CREATININE 1.4* 08/28/2011   BUN 23 08/28/2011   CO2 31 08/28/2011  TSH 1.51 08/28/2011   INR 0.88 02/20/2010       Assessment & Plan:  See problem list. Medications and labs reviewed today.

## 2012-07-01 ENCOUNTER — Other Ambulatory Visit: Payer: Self-pay | Admitting: *Deleted

## 2012-07-01 MED ORDER — VALSARTAN-HYDROCHLOROTHIAZIDE 160-12.5 MG PO TABS: 1.0000 | ORAL_TABLET | Freq: Every day | ORAL | Status: DC

## 2012-12-04 ENCOUNTER — Other Ambulatory Visit: Payer: Self-pay | Admitting: Internal Medicine

## 2012-12-11 ENCOUNTER — Other Ambulatory Visit: Payer: Self-pay | Admitting: Endocrinology

## 2013-03-04 ENCOUNTER — Other Ambulatory Visit: Payer: Self-pay | Admitting: Internal Medicine

## 2013-04-30 ENCOUNTER — Ambulatory Visit (INDEPENDENT_AMBULATORY_CARE_PROVIDER_SITE_OTHER): Payer: Medicare HMO | Admitting: Internal Medicine

## 2013-04-30 ENCOUNTER — Other Ambulatory Visit (INDEPENDENT_AMBULATORY_CARE_PROVIDER_SITE_OTHER): Payer: Medicare HMO

## 2013-04-30 ENCOUNTER — Encounter: Payer: Self-pay | Admitting: Internal Medicine

## 2013-04-30 VITALS — BP 128/72 | HR 46 | Temp 97.3°F | Wt 152.0 lb

## 2013-04-30 DIAGNOSIS — I6330 Cerebral infarction due to thrombosis of unspecified cerebral artery: Secondary | ICD-10-CM

## 2013-04-30 DIAGNOSIS — Z0000 Encounter for general adult medical examination without abnormal findings: Secondary | ICD-10-CM

## 2013-04-30 DIAGNOSIS — I10 Essential (primary) hypertension: Secondary | ICD-10-CM

## 2013-04-30 DIAGNOSIS — E785 Hyperlipidemia, unspecified: Secondary | ICD-10-CM

## 2013-04-30 LAB — URINALYSIS, ROUTINE W REFLEX MICROSCOPIC
Bilirubin Urine: NEGATIVE
Ketones, ur: NEGATIVE
Nitrite: NEGATIVE
Specific Gravity, Urine: 1.025 (ref 1.000–1.030)
Total Protein, Urine: NEGATIVE
Urine Glucose: NEGATIVE
Urobilinogen, UA: 0.2 (ref 0.0–1.0)
pH: 6 (ref 5.0–8.0)

## 2013-04-30 LAB — LIPID PANEL
Cholesterol: 292 mg/dL — ABNORMAL HIGH (ref 0–200)
HDL: 57.2 mg/dL (ref >39.00)
Total CHOL/HDL Ratio: 5
Triglycerides: 192 mg/dL — ABNORMAL HIGH (ref 0.0–149.0)
VLDL: 38.4 mg/dL (ref 0.0–40.0)

## 2013-04-30 LAB — HEPATIC FUNCTION PANEL
ALT: 31 U/L (ref 0–35)
AST: 27 U/L (ref 0–37)
Albumin: 3.6 g/dL (ref 3.5–5.2)
Alkaline Phosphatase: 128 U/L — ABNORMAL HIGH (ref 39–117)
Bilirubin, Direct: 0.1 mg/dL (ref 0.0–0.3)
Total Bilirubin: 0.7 mg/dL (ref 0.3–1.2)
Total Protein: 7 g/dL (ref 6.0–8.3)

## 2013-04-30 LAB — CBC WITH DIFFERENTIAL/PLATELET
Basophils Absolute: 0.1 10*3/uL (ref 0.0–0.1)
Basophils Relative: 2.6 % (ref 0.0–3.0)
Eosinophils Absolute: 0.2 10*3/uL (ref 0.0–0.7)
Eosinophils Relative: 3.6 % (ref 0.0–5.0)
HCT: 39.2 % (ref 36.0–46.0)
Hemoglobin: 13.3 g/dL (ref 12.0–15.0)
Lymphocytes Relative: 42.2 % (ref 12.0–46.0)
Lymphs Abs: 2.4 10*3/uL (ref 0.7–4.0)
MCHC: 33.9 g/dL (ref 30.0–36.0)
MCV: 100 fl (ref 78.0–100.0)
Monocytes Absolute: 0.5 10*3/uL (ref 0.1–1.0)
Monocytes Relative: 8.5 % (ref 3.0–12.0)
Neutro Abs: 2.4 10*3/uL (ref 1.4–7.7)
Neutrophils Relative %: 43.1 % (ref 43.0–77.0)
Platelets: 182 10*3/uL (ref 150.0–400.0)
RBC: 3.92 Mil/uL (ref 3.87–5.11)
RDW: 13.8 % (ref 11.5–14.6)
WBC: 5.7 10*3/uL (ref 4.5–10.5)

## 2013-04-30 LAB — BASIC METABOLIC PANEL
BUN: 19 mg/dL (ref 6–23)
CO2: 31 mEq/L (ref 19–32)
Calcium: 9.3 mg/dL (ref 8.4–10.5)
Chloride: 106 mEq/L (ref 96–112)
Creatinine, Ser: 1.3 mg/dL — ABNORMAL HIGH (ref 0.4–1.2)
GFR: 43.47 mL/min — ABNORMAL LOW (ref >60.00)
Glucose, Bld: 91 mg/dL (ref 70–99)
Potassium: 3.9 mEq/L (ref 3.5–5.1)
Sodium: 142 mEq/L (ref 135–145)

## 2013-04-30 LAB — LDL CHOLESTEROL, DIRECT: Direct LDL: 124.6 mg/dL

## 2013-04-30 LAB — TSH: TSH: 1.06 u[IU]/mL (ref 0.35–5.50)

## 2013-04-30 NOTE — Progress Notes (Signed)
Subjective:    Patient ID: Shirley Wells, female    DOB: 05/15/1941, 72 y.o.   MRN: [id number]  HPI   Here for medicare wellness  Diet: heart healthy Physical activity: active Depression/mood screen: negative Hearing: intact to whispered voice Visual acuity: grossly normal, performs annual eye exam  ADLs: capable Fall risk: none Home safety: good Cognitive evaluation: intact to orientation, naming, recall and repetition EOL planning: adv directives, full code/ I agree  I have personally reviewed and have noted 1. The patient's medical and social history 2. Their use of alcohol, tobacco or illicit drugs 3. Their current medications and supplements 4. The patient's functional ability including ADL's, fall risks, home safety risks and hearing or visual impairment. 5. Diet and physical activities 6. 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 reduced amlodipine due to ankle edema in 05/2010 - improved but not resolved; otherwise, denies adverse side effects related to current therapy -    dyslipidemia -off medical treatment (simva) due to >3x/increase LFTs 03/2010 - see above - no GI or Mskel - working on diet, exercise and weight loss - also omega 3   Past Medical  History  Diagnosis Date  . Hypertension, essential   . 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 Dyslipidemia     intol of statin due to >3x increase LFTs  . Osteoarthritis     Review of Systems  Constitutional: Positive for fatigue. Negative for fever.  Respiratory: Negative for cough and shortness of breath.   Cardiovascular: Negative for chest pain and palpitations.       Objective:   Physical Exam  BP 128/72  Pulse 46  Temp(Src) 97.3 F (36.3 C) (Oral)  Wt 152 lb (68.947 kg)  BMI 25.7 kg/m2  SpO2 97% Wt Readings from Last 3 Encounters:  04/30/13 152 lb (68.947 kg)  06/10/12 156 lb 9.6 oz (71.033 kg)  01/15/12 154 lb (69.854 kg)   Constitutional: She is fit - appears well-developed and well-nourished. No distress. Dtr at side Neck: Normal range of motion. Neck supple. No JVD present. No carotid bruits. No thyromegaly present.  Cardiovascular: Normal rate, regular rhythm and normal heart sounds.  No murmur heard. No BLE edema. Pulmonary/Chest: Effort normal and breath sounds normal. No respiratory distress. She has no wheezes. Neurological: She is alert and oriented to person, place, and time. No cranial nerve deficit. Coordination, balance, gait and memory/recall normal. no residual deficits appreciated Skin: Skin is warm and dry. No rash noted. No erythema.  Psychiatric: She has a normal mood and affect. Her behavior is normal. Judgment and thought content normal.   Lab Results  Component  Value Date   WBC 6.1 08/28/2011   HGB 13.8 08/28/2011   HCT 40.2 08/28/2011   PLT 207.0 08/28/2011   GLUCOSE 99 08/28/2011   CHOL 359* 06/10/2012   TRIG 284.0* 06/10/2012   HDL 65.10 06/10/2012   LDLDIRECT 118.6 06/10/2012   LDLCALC  Value: 203        Total Cholesterol/HDL:CHD Risk Coronary Heart Disease Risk Table                     Men   Women  1/2 Average Risk   3.4   3.3  Average Risk       5.0    4.4  2 X Average Risk   9.6   7.1  3 X Average Risk  23.4   11.0        Use the calculated Patient Ratio above and the CHD Risk Table to determine the patient's CHD Risk.        ATP III CLASSIFICATION (LDL):  <100     mg/dL   Optimal  100-129  mg/dL   Near or Above                    Optimal  130-159  mg/dL   Borderline  160-189  mg/dL   High  >190     mg/dL   Very High* 02/21/2010   ALT 30 06/10/2012   AST 29 06/10/2012   NA 140 08/28/2011   K 4.3 08/28/2011   CL 102 08/28/2011   CREATININE 1.4* 08/28/2011   BUN 23 08/28/2011   CO2 31 08/28/2011   TSH 1.51 08/28/2011   INR 0.88 02/20/2010       Assessment & Plan:  AWV/CPX/v70.0 - Today patient counseled on age appropriate routine health concerns for screening and prevention, each reviewed and up to date or declined. Immunizations reviewed and up to date or declined. Labs ordered and reviewed. Risk factors for depression reviewed and negative. Hearing function and visual acuity are intact. ADLs screened and addressed as needed. Functional ability and level of safety reviewed and appropriate. Education, counseling and referrals performed based on assessed risks today. Patient provided with a copy of personalized plan for preventive services.  Patient adamantly declines offer to refer for colonoscopy or mammography, also declines all immunizations and therefore updated allergy list with her intolerances to vaccination  Also see problem list. Medications and labs reviewed today.

## 2013-04-30 NOTE — Assessment & Plan Note (Signed)
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

## 2013-04-30 NOTE — Assessment & Plan Note (Signed)
brief statin trial x 4 weeks following CVA 02/2010>> caused >3x LFT increase - off same since 03/2010 Diet, weight control and omega 3 - Declines repeat trial of alt statin despite significant dyslipidemia and CVA hx

## 2013-04-30 NOTE — Assessment & Plan Note (Signed)
BP Readings from Last 3 Encounters:  04/30/13 128/72  06/10/12 122/68  01/15/12 110/72   The current medical regimen is effective;  continue present plan and medications.

## 2013-04-30 NOTE — Patient Instructions (Signed)
It was good to see you today. We have reviewed your prior records including labs and tests today Health Maintenance reviewed - all recommended immunizations and age-appropriate screenings are up-to-date or declined. Test(s) ordered today. Your results will be released to [place] (or called to you) after review, usually within 72hours after test completion. If any changes need to be made, you will be notified at that same time. Medications reviewed and updated, no changes recommended at this time. Refill on medication(s) as discussed today. Please schedule followup in 12 months, call sooner if problems.  Health Maintenance, Females A healthy lifestyle and preventative care can promote health and wellness.  Maintain regular health, dental, and eye exams.  Eat a healthy diet. Foods like vegetables, fruits, whole grains, low-fat dairy products, and lean protein foods contain the nutrients you need without too many calories. Decrease your intake of foods high in solid fats, added sugars, and salt. Get information about a proper diet from your caregiver, if necessary.  Regular physical exercise is one of the most important things you can do for your health. Most adults should get at least 150 minutes of moderate-intensity exercise (any activity that increases your heart rate and causes you to sweat) each week. In addition, most adults need muscle-strengthening exercises on 2 or more days a week.   Maintain a healthy weight. The body mass index (BMI) is a screening tool to identify possible weight problems. It provides an estimate of body fat based on height and weight. Your caregiver can help determine your BMI, and can help you achieve or maintain a healthy weight. For adults 20 years and older:  A BMI below 18.5 is considered underweight.  A BMI of 18.5 to 24.9 is normal.  A BMI of 25 to 29.9 is considered overweight.  A BMI of 30 and above is considered obese.  Maintain normal blood lipids  and cholesterol by exercising and minimizing your intake of saturated fat. Eat a balanced diet with plenty of fruits and vegetables. Blood tests for lipids and cholesterol should begin at age 18 and be repeated every 5 years. If your lipid or cholesterol levels are high, you are over 50, or you are a high risk for heart disease, you may need your cholesterol levels checked more frequently.Ongoing high lipid and cholesterol levels should be treated with medicines if diet and exercise are not effective.  If you smoke, find out from your caregiver how to quit. If you do not use tobacco, do not start.  If you are pregnant, do not drink alcohol. If you are breastfeeding, be very cautious about drinking alcohol. If you are not pregnant and choose to drink alcohol, do not exceed 1 drink per day. One drink is considered to be 12 ounces (355 mL) of beer, 5 ounces (148 mL) of wine, or 1.5 ounces (44 mL) of liquor.  Avoid use of street drugs. Do not share needles with anyone. Ask for help if you need support or instructions about stopping the use of drugs.  High blood pressure causes heart disease and increases the risk of stroke. Blood pressure should be checked at least every 1 to 2 years. Ongoing high blood pressure should be treated with medicines, if weight loss and exercise are not effective.  If you are 22 to 73 years old, ask your caregiver if you should take aspirin to prevent strokes.  Diabetes screening involves taking a blood sample to check your fasting blood sugar level. This should be done once every  3 years, after age 14, if you are within normal weight and without risk factors for diabetes. Testing should be considered at a younger age or be carried out more frequently if you are overweight and have at least 1 risk factor for diabetes.  Breast cancer screening is essential preventative care for women. You should practice "breast self-awareness." This means understanding the normal appearance and  feel of your breasts and may include breast self-examination. Any changes detected, no matter how small, should be reported to a caregiver. Women in their 88s and 30s should have a clinical breast exam (CBE) by a caregiver as part of a regular health exam every 1 to 3 years. After age 58, women should have a CBE every year. Starting at age 45, women should consider having a mammogram (breast X-ray) every year. Women who have a family history of breast cancer should talk to their caregiver about genetic screening. Women at a high risk of breast cancer should talk to their caregiver about having an MRI and a mammogram every year.  The Pap test is a screening test for cervical cancer. Women should have a Pap test starting at age 10. Between ages 11 and 25, Pap tests should be repeated every 2 years. Beginning at age 68, you should have a Pap test every 3 years as long as the past 3 Pap tests have been normal. If you had a hysterectomy for a problem that was not cancer or a condition that could lead to cancer, then you no longer need Pap tests. If you are between ages 39 and 68, and you have had normal Pap tests going back 10 years, you no longer need Pap tests. If you have had past treatment for cervical cancer or a condition that could lead to cancer, you need Pap tests and screening for cancer for at least 20 years after your treatment. If Pap tests have been discontinued, risk factors (such as a new sexual partner) need to be reassessed to determine if screening should be resumed. Some women have medical problems that increase the chance of getting cervical cancer. In these cases, your caregiver may recommend more frequent screening and Pap tests.  The human papillomavirus (HPV) test is an additional test that may be used for cervical cancer screening. The HPV test looks for the virus that can cause the cell changes on the cervix. The cells collected during the Pap test can be tested for HPV. The HPV test could  be used to screen women aged 48 years and older, and should be used in women of any age who have unclear Pap test results. After the age of 93, women should have HPV testing at the same frequency as a Pap test.  Colorectal cancer can be detected and often prevented. Most routine colorectal cancer screening begins at the age of 27 and continues through age 34. However, your caregiver may recommend screening at an earlier age if you have risk factors for colon cancer. On a yearly basis, your caregiver may provide home test kits to check for hidden blood in the stool. Use of a small camera at the end of a tube, to directly examine the colon (sigmoidoscopy or colonoscopy), can detect the earliest forms of colorectal cancer. Talk to your caregiver about this at age 13, when routine screening begins. Direct examination of the colon should be repeated every 5 to 10 years through age 39, unless early forms of pre-cancerous polyps or small growths are found.  Hepatitis C  blood testing is recommended for all people born from [birth year] through 1966 and any individual with known risks for hepatitis C.  Practice safe sex. Use condoms and avoid high-risk sexual practices to reduce the spread of sexually transmitted infections (STIs). Sexually active women aged 67 and younger should be checked for Chlamydia, which is a common sexually transmitted infection. Older women with new or multiple partners should also be tested for Chlamydia. Testing for other STIs is recommended if you are sexually active and at increased risk.  Osteoporosis is a disease in which the bones lose minerals and strength with aging. This can result in serious bone fractures. The risk of osteoporosis can be identified using a bone density scan. Women ages 86 and over and women at risk for fractures or osteoporosis should discuss screening with their caregivers. Ask your caregiver whether you should be taking a calcium supplement or vitamin D to reduce the  rate of osteoporosis.  Menopause can be associated with physical symptoms and risks. Hormone replacement therapy is available to decrease symptoms and risks. You should talk to your caregiver about whether hormone replacement therapy is right for you.  Use sunscreen with a sun protection factor (SPF) of 30 or greater. Apply sunscreen liberally and repeatedly throughout the day. You should seek shade when your shadow is shorter than you. Protect yourself by wearing long sleeves, pants, a wide-brimmed hat, and sunglasses year round, whenever you are outdoors.  Notify your caregiver of new moles or changes in moles, especially if there is a change in shape or color. Also notify your caregiver if a mole is larger than the size of a pencil eraser.  Stay current with your immunizations. Document Released: 05/28/2011 Document Revised: 02/04/2012 Document Reviewed: 05/28/2011 [facility] Patient Information 2014 [place].

## 2013-05-04 ENCOUNTER — Telehealth: Payer: Self-pay | Admitting: *Deleted

## 2013-05-04 NOTE — Telephone Encounter (Signed)
Spoke with pt daughter Holley Raring) concerning labs. See results note...lmb

## 2013-05-12 ENCOUNTER — Ambulatory Visit: Payer: Medicare HMO | Admitting: Internal Medicine

## 2013-05-14 ENCOUNTER — Other Ambulatory Visit: Payer: Self-pay | Admitting: Endocrinology

## 2013-05-14 ENCOUNTER — Other Ambulatory Visit: Payer: Self-pay | Admitting: *Deleted

## 2013-05-14 MED ORDER — AMLODIPINE BESYLATE 5 MG PO TABS: 5.0000 mg | ORAL_TABLET | Freq: Every day | ORAL | Status: DC

## 2013-07-13 ENCOUNTER — Other Ambulatory Visit: Payer: Self-pay | Admitting: Internal Medicine

## 2013-09-28 ENCOUNTER — Telehealth: Payer: Self-pay | Admitting: Internal Medicine

## 2013-09-28 NOTE — Telephone Encounter (Signed)
Patient would like for [place] to call in a new prescription for her Lisiniporil 5 mg # 60 1 bid to [facility] is leaving to go to [place] and wants to have a new rx on file at the pharmacy.

## 2013-09-28 NOTE — Telephone Encounter (Signed)
Returned call.  Pt stated she did not call today and is not taking lisinopril  Pt informed RN was calling b/c med not listed and no record of visit here.  Pt again denied calling office.

## 2013-09-28 NOTE — Telephone Encounter (Signed)
Paper chart requested.

## 2013-12-29 ENCOUNTER — Other Ambulatory Visit: Payer: Self-pay | Admitting: Internal Medicine

## 2014-04-30 ENCOUNTER — Other Ambulatory Visit (INDEPENDENT_AMBULATORY_CARE_PROVIDER_SITE_OTHER): Payer: Medicare HMO

## 2014-04-30 ENCOUNTER — Ambulatory Visit (INDEPENDENT_AMBULATORY_CARE_PROVIDER_SITE_OTHER): Payer: Medicare HMO | Admitting: Internal Medicine

## 2014-04-30 ENCOUNTER — Encounter: Payer: Self-pay | Admitting: Internal Medicine

## 2014-04-30 VITALS — BP 120/72 | HR 61 | Temp 97.7°F | Ht 64.0 in | Wt 154.4 lb

## 2014-04-30 DIAGNOSIS — I6330 Cerebral infarction due to thrombosis of unspecified cerebral artery: Secondary | ICD-10-CM

## 2014-04-30 DIAGNOSIS — I10 Essential (primary) hypertension: Secondary | ICD-10-CM

## 2014-04-30 DIAGNOSIS — M899 Disorder of bone, unspecified: Secondary | ICD-10-CM

## 2014-04-30 DIAGNOSIS — M949 Disorder of cartilage, unspecified: Secondary | ICD-10-CM

## 2014-04-30 DIAGNOSIS — M8580 Other specified disorders of bone density and structure, unspecified site: Secondary | ICD-10-CM

## 2014-04-30 DIAGNOSIS — E785 Hyperlipidemia, unspecified: Secondary | ICD-10-CM

## 2014-04-30 DIAGNOSIS — Z0000 Encounter for general adult medical examination without abnormal findings: Secondary | ICD-10-CM

## 2014-04-30 LAB — HEPATIC FUNCTION PANEL
ALT: 28 U/L (ref 0–35)
AST: 30 U/L (ref 0–37)
Albumin: 3.7 g/dL (ref 3.5–5.2)
Alkaline Phosphatase: 142 U/L — ABNORMAL HIGH (ref 39–117)
Bilirubin, Direct: 0.1 mg/dL (ref 0.0–0.3)
Total Bilirubin: 0.6 mg/dL (ref 0.2–1.2)
Total Protein: 7 g/dL (ref 6.0–8.3)

## 2014-04-30 LAB — LIPID PANEL
Cholesterol: 342 mg/dL — ABNORMAL HIGH (ref 0–200)
HDL: 57.8 mg/dL (ref >39.00)
LDL Cholesterol: 232 mg/dL — ABNORMAL HIGH (ref 0–99)
NonHDL: 284.2
Total CHOL/HDL Ratio: 6
Triglycerides: 261 mg/dL — ABNORMAL HIGH (ref 0.0–149.0)
VLDL: 52.2 mg/dL — ABNORMAL HIGH (ref 0.0–40.0)

## 2014-04-30 LAB — CBC WITH DIFFERENTIAL/PLATELET
Basophils Absolute: 0.1 10*3/uL (ref 0.0–0.1)
Basophils Relative: 0.9 % (ref 0.0–3.0)
Eosinophils Absolute: 0.2 10*3/uL (ref 0.0–0.7)
Eosinophils Relative: 3.9 % (ref 0.0–5.0)
HCT: 40.1 % (ref 36.0–46.0)
Hemoglobin: 13.6 g/dL (ref 12.0–15.0)
Lymphocytes Relative: 40.9 % (ref 12.0–46.0)
Lymphs Abs: 2.6 10*3/uL (ref 0.7–4.0)
MCHC: 34.1 g/dL (ref 30.0–36.0)
MCV: 97.8 fl (ref 78.0–100.0)
Monocytes Absolute: 0.5 10*3/uL (ref 0.1–1.0)
Monocytes Relative: 7.6 % (ref 3.0–12.0)
Neutro Abs: 3 10*3/uL (ref 1.4–7.7)
Neutrophils Relative %: 46.7 % (ref 43.0–77.0)
Platelets: 211 10*3/uL (ref 150.0–400.0)
RBC: 4.1 Mil/uL (ref 3.87–5.11)
RDW: 13.1 % (ref 11.5–15.5)
WBC: 6.4 10*3/uL (ref 4.0–10.5)

## 2014-04-30 LAB — TSH: TSH: 1.76 u[IU]/mL (ref 0.35–4.50)

## 2014-04-30 LAB — BASIC METABOLIC PANEL
BUN: 21 mg/dL (ref 6–23)
CO2: 31 mEq/L (ref 19–32)
Calcium: 9.4 mg/dL (ref 8.4–10.5)
Chloride: 107 mEq/L (ref 96–112)
Creatinine, Ser: 1.3 mg/dL — ABNORMAL HIGH (ref 0.4–1.2)
GFR: 41.47 mL/min — ABNORMAL LOW (ref >60.00)
Glucose, Bld: 98 mg/dL (ref 70–99)
Potassium: 4.4 mEq/L (ref 3.5–5.1)
Sodium: 143 mEq/L (ref 135–145)

## 2014-04-30 NOTE — Assessment & Plan Note (Signed)
brief statin trial x 4 weeks following CVA 02/2010>> caused >3x LFT increase - off same since 03/2010 Diet, weight control and omega 3 - Declines repeat trial of alt statin despite significant dyslipidemia and CVA hx

## 2014-04-30 NOTE — Progress Notes (Signed)
Pre visit review using our clinic review tool, if applicable. No additional management support is needed unless otherwise documented below in the visit note. 

## 2014-04-30 NOTE — Patient Instructions (Addendum)
It was good to see you today.  We have reviewed your prior records including labs and tests today  Health Maintenance reviewed - all recommended immunizations are up-to-date or declined.  Test(s) ordered today. Your results will be released to [place] (or called to you) after review, usually within 72hours after test completion. If any changes need to be made, you will be notified at that same time.  we'll make referral for bone density. Our office will contact you regarding appointment(s) once made.  We'll have you screened for colon cancer with COLOGAURD (stool DNA testing) - this packet will be sent to your home by the testing company. Complete instructions as in the box, and we will contact you with the results once available.  Medications reviewed and updated, no changes recommended at this time. Please take baby aspirin daily or every other day as discussed Refill on medication(s) as discussed today.  Please schedule followup in 12 months for annual exam and labs, call sooner if problems.   Health Maintenance, Female A healthy lifestyle and preventative care can promote health and wellness.  Maintain regular health, dental, and eye exams.  Eat a healthy diet. Foods like vegetables, fruits, whole grains, low-fat dairy products, and lean protein foods contain the nutrients you need without too many calories. Decrease your intake of foods high in solid fats, added sugars, and salt. Get information about a proper diet from your caregiver, if necessary.  Regular physical exercise is one of the most important things you can do for your health. Most adults should get at least 150 minutes of moderate-intensity exercise (any activity that increases your heart rate and causes you to sweat) each week. In addition, most adults need muscle-strengthening exercises on 2 or more days a week.   Maintain a healthy weight. The body mass index (BMI) is a screening tool to identify possible weight  problems. It provides an estimate of body fat based on height and weight. Your caregiver can help determine your BMI, and can help you achieve or maintain a healthy weight. For adults 20 years and older:  A BMI below 18.5 is considered underweight.  A BMI of 18.5 to 24.9 is normal.  A BMI of 25 to 29.9 is considered overweight.  A BMI of 30 and above is considered obese.  Maintain normal blood lipids and cholesterol by exercising and minimizing your intake of saturated fat. Eat a balanced diet with plenty of fruits and vegetables. Blood tests for lipids and cholesterol should begin at age 59 and be repeated every 5 years. If your lipid or cholesterol levels are high, you are over 50, or you are a high risk for heart disease, you may need your cholesterol levels checked more frequently.Ongoing high lipid and cholesterol levels should be treated with medicines if diet and exercise are not effective.  If you smoke, find out from your caregiver how to quit. If you do not use tobacco, do not start.  Lung cancer screening is recommended for adults aged 61 80 years who are at high risk for developing lung cancer because of a history of smoking. Yearly low-dose computed tomography (CT) is recommended for people who have at least a 30-pack-year history of smoking and are a current smoker or have quit within the past 15 years. A pack year of smoking is smoking an average of 1 pack of cigarettes a day for 1 year (for example: 1 pack a day for 30 years or 2 packs a day for 15 years).  Yearly screening should continue until the smoker has stopped smoking for at least 15 years. Yearly screening should also be stopped for people who develop a health problem that would prevent them from having lung cancer treatment.  If you are pregnant, do not drink alcohol. If you are breastfeeding, be very cautious about drinking alcohol. If you are not pregnant and choose to drink alcohol, do not exceed 1 drink per day. One  drink is considered to be 12 ounces (355 mL) of beer, 5 ounces (148 mL) of wine, or 1.5 ounces (44 mL) of liquor.  Avoid use of street drugs. Do not share needles with anyone. Ask for help if you need support or instructions about stopping the use of drugs.  High blood pressure causes heart disease and increases the risk of stroke. Blood pressure should be checked at least every 1 to 2 years. Ongoing high blood pressure should be treated with medicines, if weight loss and exercise are not effective.  If you are 66 to 74 years old, ask your caregiver if you should take aspirin to prevent strokes.  Diabetes screening involves taking a blood sample to check your fasting blood sugar level. This should be done once every 3 years, after age 23, if you are within normal weight and without risk factors for diabetes. Testing should be considered at a younger age or be carried out more frequently if you are overweight and have at least 1 risk factor for diabetes.  Breast cancer screening is essential preventative care for women. You should practice "breast self-awareness." This means understanding the normal appearance and feel of your breasts and may include breast self-examination. Any changes detected, no matter how small, should be reported to a caregiver. Women in their 4s and 30s should have a clinical breast exam (CBE) by a caregiver as part of a regular health exam every 1 to 3 years. After age 58, women should have a CBE every year. Starting at age 88, women should consider having a mammogram (breast X-ray) every year. Women who have a family history of breast cancer should talk to their caregiver about genetic screening. Women at a high risk of breast cancer should talk to their caregiver about having an MRI and a mammogram every year.  Breast cancer gene (BRCA)-related cancer risk assessment is recommended for women who have family members with BRCA-related cancers. BRCA-related cancers include breast,  ovarian, tubal, and peritoneal cancers. Having family members with these cancers may be associated with an increased risk for harmful changes (mutations) in the breast cancer genes BRCA1 and BRCA2. Results of the assessment will determine the need for genetic counseling and BRCA1 and BRCA2 testing.  The Pap test is a screening test for cervical cancer. Women should have a Pap test starting at age 26. Between ages 65 and 9, Pap tests should be repeated every 2 years. Beginning at age 5, you should have a Pap test every 3 years as long as the past 3 Pap tests have been normal. If you had a hysterectomy for a problem that was not cancer or a condition that could lead to cancer, then you no longer need Pap tests. If you are between ages 78 and 83, and you have had normal Pap tests going back 10 years, you no longer need Pap tests. If you have had past treatment for cervical cancer or a condition that could lead to cancer, you need Pap tests and screening for cancer for at least 20 years after your  treatment. If Pap tests have been discontinued, risk factors (such as a new sexual partner) need to be reassessed to determine if screening should be resumed. Some women have medical problems that increase the chance of getting cervical cancer. In these cases, your caregiver may recommend more frequent screening and Pap tests.  The human papillomavirus (HPV) test is an additional test that may be used for cervical cancer screening. The HPV test looks for the virus that can cause the cell changes on the cervix. The cells collected during the Pap test can be tested for HPV. The HPV test could be used to screen women aged 84 years and older, and should be used in women of any age who have unclear Pap test results. After the age of 49, women should have HPV testing at the same frequency as a Pap test.  Colorectal cancer can be detected and often prevented. Most routine colorectal cancer screening begins at the age of 18  and continues through age 80. However, your caregiver may recommend screening at an earlier age if you have risk factors for colon cancer. On a yearly basis, your caregiver may provide home test kits to check for hidden blood in the stool. Use of a small camera at the end of a tube, to directly examine the colon (sigmoidoscopy or colonoscopy), can detect the earliest forms of colorectal cancer. Talk to your caregiver about this at age 11, when routine screening begins. Direct examination of the colon should be repeated every 5 to 10 years through age 68, unless early forms of pre-cancerous polyps or small growths are found.  Hepatitis C blood testing is recommended for all people born from [birth year] through 1966 and any individual with known risks for hepatitis C.  Practice safe sex. Use condoms and avoid high-risk sexual practices to reduce the spread of sexually transmitted infections (STIs). Sexually active women aged 48 and younger should be checked for Chlamydia, which is a common sexually transmitted infection. Older women with new or multiple partners should also be tested for Chlamydia. Testing for other STIs is recommended if you are sexually active and at increased risk.  Osteoporosis is a disease in which the bones lose minerals and strength with aging. This can result in serious bone fractures. The risk of osteoporosis can be identified using a bone density scan. Women ages 53 and over and women at risk for fractures or osteoporosis should discuss screening with their caregivers. Ask your caregiver whether you should be taking a calcium supplement or vitamin D to reduce the rate of osteoporosis.  Menopause can be associated with physical symptoms and risks. Hormone replacement therapy is available to decrease symptoms and risks. You should talk to your caregiver about whether hormone replacement therapy is right for you.  Use sunscreen. Apply sunscreen liberally and repeatedly throughout the day.  You should seek shade when your shadow is shorter than you. Protect yourself by wearing long sleeves, pants, a wide-brimmed hat, and sunglasses year round, whenever you are outdoors.  Notify your caregiver of new moles or changes in moles, especially if there is a change in shape or color. Also notify your caregiver if a mole is larger than the size of a pencil eraser.  Stay current with your immunizations. Document Released: 05/28/2011 Document Revised: 03/09/2013 Document Reviewed: 05/28/2011 [facility] Patient Information 2014 [place].

## 2014-04-30 NOTE — Assessment & Plan Note (Signed)
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

## 2014-04-30 NOTE — Assessment & Plan Note (Signed)
BP Readings from Last 3 Encounters:  04/30/14 120/72  04/30/13 128/72  06/10/12 122/68   The current medical regimen is effective;  continue present plan and medications.

## 2014-04-30 NOTE — Progress Notes (Signed)
Subjective:    Patient ID: Shirley Wells, female    DOB: 1941/03/05, 73 y.o.   MRN: [id number]  HPI   Here for medicare wellness  Diet: heart healthy Physical activity: sedentary Depression/mood screen: negative Hearing: intact to whispered voice Visual acuity: grossly normal, performs annual eye exam  ADLs: capable Fall risk: none Home safety: good Cognitive evaluation: intact to orientation, naming, recall and repetition EOL planning: adv directives, full code/ I agree  I have personally reviewed and have noted 1. The patient's medical and social history 2. Their use of alcohol, tobacco or illicit drugs 3. Their current medications and supplements 4. The patient's functional ability including ADL's, fall risks, home safety risks and hearing or visual impairment. 5. Diet and physical activities 6. Evidence for depression or mood disorders  Also reviewed chronic medical issues and interval medical events  Past Medical History  Diagnosis Date  . Hypertension, essential   . 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 Dyslipidemia     intol of statin due to >3x increase LFTs  . Osteoarthritis    Family History  Problem Relation Age of Onset  . Arthritis Other    History  Substance Use Topics  . Smoking status: Never Smoker   . Smokeless tobacco: Not on file  . Alcohol Use: Yes     Comment: Rare    Review of Systems  Constitutional: Negative for fatigue and unexpected weight change.  Respiratory: Negative for cough, shortness of breath and wheezing.   Cardiovascular: Negative for chest pain, palpitations and leg swelling.  Gastrointestinal: Negative for nausea, abdominal pain and diarrhea.  Neurological: Negative for dizziness, weakness, light-headedness and headaches.  Psychiatric/Behavioral: Negative for dysphoric mood. The patient is not nervous/anxious.   All other systems  reviewed and are negative.      Objective:   Physical Exam  BP 120/72  Pulse 61  Temp(Src) 97.7 F (36.5 C) (Oral)  Wt 154 lb 6.4 oz (70.035 kg)  SpO2 97% Wt Readings from Last 3 Encounters:  04/30/14 154 lb 6.4 oz (70.035 kg)  04/30/13 152 lb (68.947 kg)  06/10/12 156 lb 9.6 oz (71.033 kg)   Constitutional: She appears well-developed and well-nourished. No distress. Dtr at side Neck: Normal range of motion. Neck supple. No LAD or JVD present. No bruit. No thyromegaly present.  Cardiovascular: Normal rate, regular rhythm and normal heart sounds.  No murmur heard. No BLE edema. Pulmonary/Chest: Effort normal and breath sounds normal. No respiratory distress. She has no wheezes.  Musculoskeletal: osteoarthritic changes bilateral hands, especially PIP Neurologic: Awake, alert, oriented x4. Cranial nerves II through XII symmetrically intact. Speech, gait, and recall are normal Psychiatric: She has a normal mood and affect. Her behavior is normal. Judgment and thought content normal.   Lab Results  Component Value Date   WBC 5.7 04/30/2013   HGB 13.3 04/30/2013   HCT 39.2 04/30/2013   PLT 182.0 04/30/2013   GLUCOSE 91 04/30/2013   CHOL 292* 04/30/2013   TRIG 192.0* 04/30/2013   HDL 57.20 04/30/2013   LDLDIRECT 124.6 04/30/2013   LDLCALC  Value: 203        Total Cholesterol/HDL:CHD Risk Coronary Heart Disease Risk Table                     Men   Women  1/2 Average Risk   3.4   3.3  Average Risk       5.0   4.4  2 X Average Risk   9.6   7.1  3 X Average Risk  23.4   11.0        Use the calculated Patient Ratio above and the CHD Risk Table to determine the patient's CHD Risk.        ATP III CLASSIFICATION (LDL):  <100     mg/dL   Optimal  100-129  mg/dL   Near or Above                    Optimal  130-159  mg/dL   Borderline  160-189  mg/dL   High  >190     mg/dL   Very High* 02/21/2010   ALT 31 04/30/2013   AST 27 04/30/2013   NA 142 04/30/2013   K 3.9 04/30/2013   CL 106 04/30/2013   CREATININE 1.3* 04/30/2013     BUN 19 04/30/2013   CO2 31 04/30/2013   TSH 1.06 04/30/2013   INR 0.88 02/20/2010    Dg Chest 2 View  01/15/2012   *RADIOLOGY REPORT*  Clinical Data: 73 year old female with cough.  CHEST - 2 VIEW  Comparison: 02/20/2010.  Findings: Larger lung volumes.  Cardiac size and mediastinal contours are within normal limits.  Visualized tracheal air column is within normal limits.  Stable mild biapical scarring.  No pneumothorax, pulmonary edema, pleural effusion or confluent pulmonary opacity. No acute osseous abnormality identified.  IMPRESSION: No acute cardiopulmonary abnormality.  Original Report Authenticated By: Randall An, M.D.      Assessment & Plan:   AWV/v70.0 - Today patient counseled on age appropriate routine health concerns for screening and prevention, each reviewed and up to date or declined. Refer for cologaurd and DEXA. Immunizations reviewed and up to date or declined. Labs ordered and reviewed. Risk factors for depression reviewed and negative. Hearing function and visual acuity are intact. ADLs screened and addressed as needed. Functional ability and level of safety reviewed and appropriate. Education, counseling and referrals performed based on assessed risks today. Patient provided with a copy of personalized plan for preventive services.  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 - declined IC stent as recommended by IR/neuro - Med mgmt as tolerated -  no statin due to inc LFTs >3x 03/2010 after starting simva x 4 weeks Encourage compliance with aspirin as prescribed    Relevant Orders      Basic metabolic panel      CBC with Differential      Hepatic function panel      Lipid panel      TSH   HYPERLIPIDEMIA     brief statin trial x 4 weeks following CVA 02/2010>> caused >3x LFT increase - off same  since 03/2010 Diet, weight control and omega 3 - Declines repeat trial of alt statin despite significant dyslipidemia and CVA hx    Relevant Orders      Lipid panel   HYPERTENSION      BP Readings from Last 3 Encounters:  04/30/14 120/72  04/30/13 128/72  06/10/12 122/68   The current medical regimen is effective;  continue present plan and medications.    Relevant Orders      Lipid panel   Osteopenia  Relevant Orders      TSH      DG Bone Density    Other Visit Diagnoses   Routine general medical examination at a health care facility    -  Primary    Relevant Orders       Basic metabolic panel       CBC with Differential       Hepatic function panel       Lipid panel       TSH

## 2014-08-01 ENCOUNTER — Other Ambulatory Visit: Payer: Self-pay | Admitting: Internal Medicine

## 2014-09-21 ENCOUNTER — Other Ambulatory Visit: Payer: Self-pay | Admitting: Internal Medicine

## 2014-11-11 ENCOUNTER — Other Ambulatory Visit: Payer: Self-pay | Admitting: Internal Medicine

## 2015-02-02 LAB — FECAL OCCULT BLOOD, GUAIAC: Fecal Occult Blood: NEGATIVE

## 2015-02-02 LAB — COLOGUARD

## 2015-02-08 ENCOUNTER — Other Ambulatory Visit: Payer: Self-pay | Admitting: Internal Medicine

## 2015-03-01 ENCOUNTER — Encounter: Payer: Self-pay | Admitting: Internal Medicine

## 2015-04-04 ENCOUNTER — Encounter: Payer: Self-pay | Admitting: Internal Medicine

## 2015-05-03 ENCOUNTER — Encounter: Payer: Medicare HMO | Admitting: Internal Medicine

## 2015-07-18 ENCOUNTER — Telehealth: Payer: Self-pay

## 2015-07-18 NOTE — Telephone Encounter (Signed)
Call to schedule for AWV; LVM to see if the patient can come in prior to 9/7 apt with Dr. Asa Lente;

## 2015-07-22 NOTE — Telephone Encounter (Signed)
Spoke to [employer] energy patient who stated she really is doing well; still working in her self owned business and and not issues;  Will plan to see Dr Asa Lente for CPE and will decline separate AWV at this time.

## 2015-08-03 ENCOUNTER — Ambulatory Visit (INDEPENDENT_AMBULATORY_CARE_PROVIDER_SITE_OTHER)
Admission: RE | Admit: 2015-08-03 | Discharge: 2015-08-03 | Disposition: A | Discharge status: Home / Self Care | Payer: Medicare FFS | Source: Ambulatory Visit | Attending: Internal Medicine | Admitting: Internal Medicine

## 2015-08-03 ENCOUNTER — Ambulatory Visit (HOSPITAL_COMMUNITY)
Admission: RE | Admit: 2015-08-03 | Discharge: 2015-08-03 | Disposition: A | Discharge status: Home / Self Care | Payer: Medicare FFS | Source: Ambulatory Visit | Attending: Urology | Admitting: Urology

## 2015-08-03 ENCOUNTER — Ambulatory Visit (INDEPENDENT_AMBULATORY_CARE_PROVIDER_SITE_OTHER): Payer: Medicare FFS | Admitting: Internal Medicine

## 2015-08-03 ENCOUNTER — Encounter: Payer: Self-pay | Admitting: Internal Medicine

## 2015-08-03 ENCOUNTER — Other Ambulatory Visit (INDEPENDENT_AMBULATORY_CARE_PROVIDER_SITE_OTHER): Payer: Medicare FFS

## 2015-08-03 VITALS — BP 138/70 | HR 49 | Temp 97.9°F | Ht 64.0 in | Wt 158.5 lb

## 2015-08-03 DIAGNOSIS — I10 Essential (primary) hypertension: Secondary | ICD-10-CM

## 2015-08-03 DIAGNOSIS — M8580 Other specified disorders of bone density and structure, unspecified site: Secondary | ICD-10-CM

## 2015-08-03 DIAGNOSIS — I6523 Occlusion and stenosis of bilateral carotid arteries: Secondary | ICD-10-CM | POA: Diagnosis not present

## 2015-08-03 DIAGNOSIS — Z0000 Encounter for general adult medical examination without abnormal findings: Secondary | ICD-10-CM | POA: Diagnosis not present

## 2015-08-03 DIAGNOSIS — E785 Hyperlipidemia, unspecified: Secondary | ICD-10-CM

## 2015-08-03 DIAGNOSIS — I6330 Cerebral infarction due to thrombosis of unspecified cerebral artery: Secondary | ICD-10-CM

## 2015-08-03 LAB — URINALYSIS, ROUTINE W REFLEX MICROSCOPIC
Bilirubin Urine: NEGATIVE
Ketones, ur: NEGATIVE
Nitrite: NEGATIVE
Specific Gravity, Urine: 1.02 (ref 1.000–1.030)
Total Protein, Urine: NEGATIVE
Urine Glucose: NEGATIVE
Urobilinogen, UA: 0.2 (ref 0.0–1.0)
pH: 5.5 (ref 5.0–8.0)

## 2015-08-03 LAB — CBC WITH DIFFERENTIAL/PLATELET
Basophils Absolute: 0.1 10*3/uL (ref 0.0–0.1)
Basophils Relative: 0.6 % (ref 0.0–3.0)
Eosinophils Absolute: 0.3 10*3/uL (ref 0.0–0.7)
Eosinophils Relative: 3.5 % (ref 0.0–5.0)
HCT: 42.2 % (ref 36.0–46.0)
Hemoglobin: 14.4 g/dL (ref 12.0–15.0)
Lymphocytes Relative: 38.5 % (ref 12.0–46.0)
Lymphs Abs: 3.5 10*3/uL (ref 0.7–4.0)
MCHC: 34.1 g/dL (ref 30.0–36.0)
MCV: 97.4 fl (ref 78.0–100.0)
Monocytes Absolute: 0.6 10*3/uL (ref 0.1–1.0)
Monocytes Relative: 6.3 % (ref 3.0–12.0)
Neutro Abs: 4.6 10*3/uL (ref 1.4–7.7)
Neutrophils Relative %: 51.1 % (ref 43.0–77.0)
Platelets: 207 10*3/uL (ref 150.0–400.0)
RBC: 4.33 Mil/uL (ref 3.87–5.11)
RDW: 14 % (ref 11.5–15.5)
WBC: 9 10*3/uL (ref 4.0–10.5)

## 2015-08-03 LAB — LIPID PANEL
Cholesterol: 348 mg/dL — ABNORMAL HIGH (ref 0–200)
HDL: 53 mg/dL (ref >39.00)
NonHDL: 295.09
Total CHOL/HDL Ratio: 7
Triglycerides: 298 mg/dL — ABNORMAL HIGH (ref 0.0–149.0)
VLDL: 59.6 mg/dL — ABNORMAL HIGH (ref 0.0–40.0)

## 2015-08-03 LAB — BASIC METABOLIC PANEL
BUN: 25 mg/dL — ABNORMAL HIGH (ref 6–23)
CO2: 30 mEq/L (ref 19–32)
Calcium: 9.3 mg/dL (ref 8.4–10.5)
Chloride: 103 mEq/L (ref 96–112)
Creatinine, Ser: 1.39 mg/dL — ABNORMAL HIGH (ref 0.40–1.20)
GFR: 39.28 mL/min — ABNORMAL LOW (ref >60.00)
Glucose, Bld: 85 mg/dL (ref 70–99)
Potassium: 3.5 mEq/L (ref 3.5–5.1)
Sodium: 141 mEq/L (ref 135–145)

## 2015-08-03 LAB — HEPATIC FUNCTION PANEL
ALT: 25 U/L (ref 0–35)
AST: 22 U/L (ref 0–37)
Albumin: 4.1 g/dL (ref 3.5–5.2)
Alkaline Phosphatase: 148 U/L — ABNORMAL HIGH (ref 39–117)
Bilirubin, Direct: 0.1 mg/dL (ref 0.0–0.3)
Total Bilirubin: 0.6 mg/dL (ref 0.2–1.2)
Total Protein: 7.6 g/dL (ref 6.0–8.3)

## 2015-08-03 LAB — TSH: TSH: 1.88 u[IU]/mL (ref 0.35–4.50)

## 2015-08-03 LAB — LDL CHOLESTEROL, DIRECT: Direct LDL: 102 mg/dL

## 2015-08-03 MED ORDER — CLOPIDOGREL BISULFATE 75 MG PO TABS: 75.0000 mg | ORAL_TABLET | Freq: Every day | ORAL | Status: DC

## 2015-08-03 MED ORDER — VALSARTAN-HYDROCHLOROTHIAZIDE 160-12.5 MG PO TABS: 1.0000 | ORAL_TABLET | Freq: Every day | ORAL | Status: DC

## 2015-08-03 MED ORDER — AMLODIPINE BESYLATE 5 MG PO TABS: 5.0000 mg | ORAL_TABLET | Freq: Every day | ORAL | Status: DC

## 2015-08-03 NOTE — Progress Notes (Signed)
Pre visit review using our clinic review tool, if applicable. No additional management support is needed unless otherwise documented below in the visit note. 

## 2015-08-03 NOTE — Progress Notes (Signed)
Subjective:    Patient ID: Shirley Wells, female    DOB: [birth year], 73 y.o.   MRN: [id number]  HPI   Here for medicare wellness  Diet: heart healthy  Physical activity: sedentary Depression/mood screen: negative Hearing: intact to whispered voice Visual acuity: grossly normal, performs annual eye exam  ADLs: capable Fall risk: none Home safety: good Cognitive evaluation: intact to orientation, naming, recall and repetition EOL planning: adv directives, full code/ I agree  I have personally reviewed and have noted 1. The patient's medical and social history 2. Their use of alcohol, tobacco or illicit drugs 3. Their current medications and supplements 4. The patient's functional ability including ADL's, fall risks, home safety risks and hearing or visual impairment. 5. Diet and physical activities 6. Evidence for depression or mood disorders  Also reviewed chronic medical conditions, interval events and current concerns   Past Medical History  Diagnosis Date  . Hypertension, essential   . 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 Dyslipidemia     intol of statin due to >3x increase LFTs  . Osteoarthritis    Family History  Problem Relation Age of Onset  . Arthritis Other   . Breast cancer Mother    Social History  Substance Use Topics  . Smoking status: Never Smoker   . Smokeless tobacco: None  . Alcohol Use: No    Review of Systems  Constitutional: Negative for fatigue and unexpected weight change.  Respiratory: Negative for cough, shortness of breath and wheezing.   Cardiovascular: Negative for chest pain, palpitations and leg swelling.  Gastrointestinal: Negative for nausea, abdominal pain and diarrhea.  Neurological: Negative for dizziness, weakness, light-headedness and headaches.  Psychiatric/Behavioral: Negative for dysphoric mood. The patient is not nervous/anxious.     All other systems reviewed and are negative.  Patient Care Team: Rowe Clack, MD as PCP - General Garvin Fila, MD as Consulting Physician (Neurology)      Objective:    Physical Exam  Constitutional: She is oriented to person, place, and time. She appears well-developed and well-nourished. No distress.  HENT:  Head: Normocephalic and atraumatic.  Right Ear: External ear normal.  Left Ear: External ear normal.  Nose: Nose normal.  Mouth/Throat: Oropharynx is clear and moist. No oropharyngeal exudate.  Eyes: EOM are normal. Pupils are equal, round, and reactive to light. Right eye exhibits no discharge. Left eye exhibits no discharge. No scleral icterus.  Neck: Normal range of motion. Neck supple. No JVD present. No tracheal deviation present. No thyromegaly present.  No carotid bruits  Cardiovascular: Normal rate, regular rhythm, normal heart sounds and intact distal pulses.  Exam reveals no friction rub.   No murmur heard. Pulmonary/Chest: Effort normal and breath sounds normal. No respiratory distress. She has no wheezes. She has no rales. She exhibits no tenderness.  Abdominal: Soft. Bowel sounds are normal. She exhibits no distension and no mass. There is no tenderness. There is no rebound and no guarding.  Genitourinary:  Defer to gyn  Musculoskeletal: Normal range of motion. She exhibits no edema.  No gross deformities  Lymphadenopathy:    She has no cervical adenopathy.  Neurological: She is alert and oriented to person, place, and time. She has normal reflexes. No cranial nerve deficit.  Skin: Skin is warm and dry. No rash noted. She is not diaphoretic. No erythema.  Psychiatric: She has a normal  mood and affect. Her behavior is normal. Judgment and thought content normal.  Nursing note and vitals reviewed.   BP 138/70 mmHg  Pulse 49  Temp(Src) 97.9 F (36.6 C) (Oral)  Ht 5\' 4"  (1.626 m)  Wt 158 lb 8 oz (71.895 kg)  BMI 27.19 kg/m2  SpO2 96% Wt Readings  from Last 3 Encounters:  08/03/15 158 lb 8 oz (71.895 kg)  04/30/14 154 lb 6.4 oz (70.035 kg)  04/30/13 152 lb (68.947 kg)    Lab Results  Component Value Date   WBC 6.4 04/30/2014   HGB 13.6 04/30/2014   HCT 40.1 04/30/2014   PLT 211.0 04/30/2014   GLUCOSE 98 04/30/2014   CHOL 342* 04/30/2014   TRIG 261.0* 04/30/2014   HDL 57.80 04/30/2014   LDLDIRECT 124.6 04/30/2013   LDLCALC 232* 04/30/2014   ALT 28 04/30/2014   AST 30 04/30/2014   NA 143 04/30/2014   K 4.4 04/30/2014   CL 107 04/30/2014   CREATININE 1.3* 04/30/2014   BUN 21 04/30/2014   CO2 31 04/30/2014   TSH 1.76 04/30/2014   INR 0.88 02/20/2010    Dg Chest 2 View  01/15/2012   *RADIOLOGY REPORT*  Clinical Data: 74 year old female with cough.  CHEST - 2 VIEW  Comparison: 02/20/2010.  Findings: Larger lung volumes.  Cardiac size and mediastinal contours are within normal limits.  Visualized tracheal air column is within normal limits.  Stable mild biapical scarring.  No pneumothorax, pulmonary edema, pleural effusion or confluent pulmonary opacity. No acute osseous abnormality identified.  IMPRESSION: No acute cardiopulmonary abnormality.  Original Report Authenticated By: Randall An, M.D.      Assessment & Plan:   AWV/z00.00 - Today patient counseled on age appropriate routine health concerns for screening and prevention, each reviewed and up to date or declined. Immunizations reviewed and up to date or declined. Labs ordered and reviewed. Risk factors for depression reviewed and negative. Hearing function and visual acuity are intact. ADLs screened and addressed as needed. Functional ability and level of safety reviewed and appropriate. Education, counseling and referrals performed based on assessed risks today. Patient provided with a copy of personalized plan for preventive services.  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 - declined IC stent as recommended by IR/neuro - Med mgmt as tolerated -  no statin due to inc LFTs >3x 03/2010 after starting simva x 4 weeks Encourage compliance with aspirin and Plavix as prescribed Check carotid dopplers now      Relevant Orders   Basic metabolic panel   CBC with Differential/Platelet   Hepatic function panel   Lipid panel   TSH   VAS US CAROTID   Dyslipidemia    brief statin trial x 4 weeks following CVA 02/2010>> caused >3x LFT increase - off same since 03/2010 Diet, weight control and omega 3 - Declines repeat trial of alt statin despite significant dyslipidemia and CVA hx - I support her educated decision on this      Relevant Orders   Lipid panel   VAS US CAROTID   Essential hypertension    BP Readings from Last 3 Encounters:  08/03/15 138/70  04/30/14 120/72  04/30/13 128/72   The current medical regimen is effective;  continue present plan and medications.      Relevant Orders  Lipid panel   Urinalysis, Routine w reflex microscopic (not at [facility])   EKG 12-Lead (Completed)   VAS US CAROTID   Osteopenia    Will arrange follow up DEXA now Continue Vit D and Ca and WB exercises      Relevant Orders   TSH   DG Bone Density    Other Visit Diagnoses    Routine general medical examination at a health care facility    -  Primary        Gwendolyn Grant, MD

## 2015-08-03 NOTE — Assessment & Plan Note (Signed)
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

## 2015-08-03 NOTE — Assessment & Plan Note (Signed)
Will arrange follow up DEXA now Continue Vit D and Ca and WB exercises

## 2015-08-03 NOTE — Assessment & Plan Note (Signed)
brief statin trial x 4 weeks following CVA 02/2010>> caused >3x LFT increase - off same since 03/2010 Diet, weight control and omega 3 - Declines repeat trial of alt statin despite significant dyslipidemia and CVA hx - I support her educated decision on this

## 2015-08-03 NOTE — Assessment & Plan Note (Signed)
BP Readings from Last 3 Encounters:  08/03/15 138/70  04/30/14 120/72  04/30/13 128/72   The current medical regimen is effective;  continue present plan and medications.

## 2015-08-03 NOTE — Patient Instructions (Addendum)
It was good to see you today.  We have reviewed your prior records including labs and tests today  Health Maintenance reviewed - let us know if you change your mind about any immunizations - all other recommended age-appropriate screenings are up-to-date.  Test(s) ordered today. Your results will be released to [place] (or called to you) after review, usually within 72hours after test completion. If any changes need to be made, you will be notified at that same time.  will refer for carotid ultrasound at [facility] as discussed  Medications reviewed and updated, no changes recommended at this time. Refill on medication(s) as discussed today.  Please schedule followup in 12 months for annual exam and labs, call sooner if problems.  Health Maintenance Adopting a healthy lifestyle and getting preventive care can go a long way to promote health and wellness. Talk with your health care provider about what schedule of regular examinations is right for you. This is a good chance for you to check in with your provider about disease prevention and staying healthy. In between checkups, there are plenty of things you can do on your own. Experts have done a lot of research about which lifestyle changes and preventive measures are most likely to keep you healthy. Ask your health care provider for more information. WEIGHT AND DIET  Eat a healthy diet  Be sure to include plenty of vegetables, fruits, low-fat dairy products, and lean protein.  Do not eat a lot of foods high in solid fats, added sugars, or salt.  Get regular exercise. This is one of the most important things you can do for your health.  Most adults should exercise for at least 150 minutes each week. The exercise should increase your heart rate and make you sweat (moderate-intensity exercise).  Most adults should also do strengthening exercises at least twice a week. This is in addition to the moderate-intensity exercise.  Maintain a  healthy weight  Body mass index (BMI) is a measurement that can be used to identify possible weight problems. It estimates body fat based on height and weight. Your health care provider can help determine your BMI and help you achieve or maintain a healthy weight.  For females 7 years of age and older:   A BMI below 18.5 is considered underweight.  A BMI of 18.5 to 24.9 is normal.  A BMI of 25 to 29.9 is considered overweight.  A BMI of 30 and above is considered obese.  Watch levels of cholesterol and blood lipids  You should start having your blood tested for lipids and cholesterol at 75 years of age, then have this test every 5 years.  You may need to have your cholesterol levels checked more often if:  Your lipid or cholesterol levels are high.  You are older than 75 years of age.  You are at high risk for heart disease.  CANCER SCREENING   Lung Cancer  Lung cancer screening is recommended for adults 6-2 years old who are at high risk for lung cancer because of a history of smoking.  A yearly low-dose CT scan of the lungs is recommended for people who:  Currently smoke.  Have quit within the past 15 years.  Have at least a 30-pack-year history of smoking. A pack year is smoking an average of one pack of cigarettes a day for 1 year.  Yearly screening should continue until it has been 15 years since you quit.  Yearly screening should stop if you develop  a health problem that would prevent you from having lung cancer treatment.  Breast Cancer  Practice breast self-awareness. This means understanding how your breasts normally appear and feel.  It also means doing regular breast self-exams. Let your health care provider know about any changes, no matter how small.  If you are in your 20s or 30s, you should have a clinical breast exam (CBE) by a health care provider every 1-3 years as part of a regular health exam.  If you are 33 or older, have a CBE every year.  Also consider having a breast X-ray (mammogram) every year.  If you have a family history of breast cancer, talk to your health care provider about genetic screening.  If you are at high risk for breast cancer, talk to your health care provider about having an MRI and a mammogram every year.  Breast cancer gene (BRCA) assessment is recommended for women who have family members with BRCA-related cancers. BRCA-related cancers include:  Breast.  Ovarian.  Tubal.  Peritoneal cancers.  Results of the assessment will determine the need for genetic counseling and BRCA1 and BRCA2 testing. Cervical Cancer Routine pelvic examinations to screen for cervical cancer are no longer recommended for nonpregnant women who are considered low risk for cancer of the pelvic organs (ovaries, uterus, and vagina) and who do not have symptoms. A pelvic examination may be necessary if you have symptoms including those associated with pelvic infections. Ask your health care provider if a screening pelvic exam is right for you.   The Pap test is the screening test for cervical cancer for women who are considered at risk.  If you had a hysterectomy for a problem that was not cancer or a condition that could lead to cancer, then you no longer need Pap tests.  If you are older than 65 years, and you have had normal Pap tests for the past 10 years, you no longer need to have Pap tests.  If you have had past treatment for cervical cancer or a condition that could lead to cancer, you need Pap tests and screening for cancer for at least 20 years after your treatment.  If you no longer get a Pap test, assess your risk factors if they change (such as having a new sexual partner). This can affect whether you should start being screened again.  Some women have medical problems that increase their chance of getting cervical cancer. If this is the case for you, your health care provider may recommend more frequent screening and  Pap tests.  The human papillomavirus (HPV) test is another test that may be used for cervical cancer screening. The HPV test looks for the virus that can cause cell changes in the cervix. The cells collected during the Pap test can be tested for HPV.  The HPV test can be used to screen women 87 years of age and older. Getting tested for HPV can extend the interval between normal Pap tests from three to five years.  An HPV test also should be used to screen women of any age who have unclear Pap test results.  After 75 years of age, women should have HPV testing as often as Pap tests.  Colorectal Cancer  This type of cancer can be detected and often prevented.  Routine colorectal cancer screening usually begins at 75 years of age and continues through 75 years of age.  Your health care provider may recommend screening at an earlier age if you have risk  factors for colon cancer.  Your health care provider may also recommend using home test kits to check for hidden blood in the stool.  A small camera at the end of a tube can be used to examine your colon directly (sigmoidoscopy or colonoscopy). This is done to check for the earliest forms of colorectal cancer.  Routine screening usually begins at age 30.  Direct examination of the colon should be repeated every 5-10 years through 75 years of age. However, you may need to be screened more often if early forms of precancerous polyps or small growths are found. Skin Cancer  Check your skin from head to toe regularly.  Tell your health care provider about any new moles or changes in moles, especially if there is a change in a mole's shape or color.  Also tell your health care provider if you have a mole that is larger than the size of a pencil eraser.  Always use sunscreen. Apply sunscreen liberally and repeatedly throughout the day.  Protect yourself by wearing long sleeves, pants, a wide-brimmed hat, and sunglasses whenever you are  outside. HEART DISEASE, DIABETES, AND HIGH BLOOD PRESSURE   Have your blood pressure checked at least every 1-2 years. High blood pressure causes heart disease and increases the risk of stroke.  If you are between 45 years and 52 years old, ask your health care provider if you should take aspirin to prevent strokes.  Have regular diabetes screenings. This involves taking a blood sample to check your fasting blood sugar level.  If you are at a normal weight and have a low risk for diabetes, have this test once every three years after 75 years of age.  If you are overweight and have a high risk for diabetes, consider being tested at a younger age or more often. PREVENTING INFECTION  Hepatitis B  If you have a higher risk for hepatitis B, you should be screened for this virus. You are considered at high risk for hepatitis B if:  You were born in a country where hepatitis B is common. Ask your health care provider which countries are considered high risk.  Your parents were born in a high-risk country, and you have not been immunized against hepatitis B (hepatitis B vaccine).  You have HIV or AIDS.  You use needles to inject street drugs.  You live with someone who has hepatitis B.  You have had sex with someone who has hepatitis B.  You get hemodialysis treatment.  You take certain medicines for conditions, including cancer, organ transplantation, and autoimmune conditions. Hepatitis C  Blood testing is recommended for:  Everyone born from [birth year] through 1966.  Anyone with known risk factors for hepatitis C. Sexually transmitted infections (STIs)  You should be screened for sexually transmitted infections (STIs) including gonorrhea and chlamydia if:  You are sexually active and are younger than 75 years of age.  You are older than 75 years of age and your health care provider tells you that you are at risk for this type of infection.  Your sexual activity has changed since  you were last screened and you are at an increased risk for chlamydia or gonorrhea. Ask your health care provider if you are at risk.  If you do not have HIV, but are at risk, it may be recommended that you take a prescription medicine daily to prevent HIV infection. This is called pre-exposure prophylaxis (PrEP). You are considered at risk if:  You are sexually active  and do not regularly use condoms or know the HIV status of your partner(s).  You take drugs by injection.  You are sexually active with a partner who has HIV. Talk with your health care provider about whether you are at high risk of being infected with HIV. If you choose to begin PrEP, you should first be tested for HIV. You should then be tested every 3 months for as long as you are taking PrEP.  PREGNANCY   If you are premenopausal and you may become pregnant, ask your health care provider about preconception counseling.  If you may become pregnant, take 400 to 800 micrograms (mcg) of folic acid every day.  If you want to prevent pregnancy, talk to your health care provider about birth control (contraception). OSTEOPOROSIS AND MENOPAUSE   Osteoporosis is a disease in which the bones lose minerals and strength with aging. This can result in serious bone fractures. Your risk for osteoporosis can be identified using a bone density scan.  If you are 58 years of age or older, or if you are at risk for osteoporosis and fractures, ask your health care provider if you should be screened.  Ask your health care provider whether you should take a calcium or vitamin D supplement to lower your risk for osteoporosis.  Menopause may have certain physical symptoms and risks.  Hormone replacement therapy may reduce some of these symptoms and risks. Talk to your health care provider about whether hormone replacement therapy is right for you.  HOME CARE INSTRUCTIONS   Schedule regular health, dental, and eye exams.  Stay current with  your immunizations.   Do not use any tobacco products including cigarettes, chewing tobacco, or electronic cigarettes.  If you are pregnant, do not drink alcohol.  If you are breastfeeding, limit how much and how often you drink alcohol.  Limit alcohol intake to no more than 1 drink per day for nonpregnant women. One drink equals 12 ounces of beer, 5 ounces of wine, or 1 ounces of hard liquor.  Do not use street drugs.  Do not share needles.  Ask your health care provider for help if you need support or information about quitting drugs.  Tell your health care provider if you often feel depressed.  Tell your health care provider if you have ever been abused or do not feel safe at home. Document Released: 05/28/2011 Document Revised: 03/29/2014 Document Reviewed: 10/14/2013 [facility] Patient Information 2015 [place], [place]. This information is not intended to replace advice given to you by your health care provider. Make sure you discuss any questions you have with your health care provider.

## 2016-09-05 ENCOUNTER — Other Ambulatory Visit: Payer: Self-pay | Admitting: Internal Medicine

## 2016-09-07 ENCOUNTER — Other Ambulatory Visit: Payer: Self-pay | Admitting: Emergency Medicine

## 2016-09-07 MED ORDER — VALSARTAN-HYDROCHLOROTHIAZIDE 160-12.5 MG PO TABS: 1.0000 | ORAL_TABLET | Freq: Every day | ORAL | 0 refills | Status: DC

## 2016-11-02 ENCOUNTER — Other Ambulatory Visit (INDEPENDENT_AMBULATORY_CARE_PROVIDER_SITE_OTHER): Payer: Medicare FFS

## 2016-11-02 ENCOUNTER — Ambulatory Visit (INDEPENDENT_AMBULATORY_CARE_PROVIDER_SITE_OTHER): Payer: Medicare FFS | Admitting: Internal Medicine

## 2016-11-02 VITALS — BP 134/78 | HR 63 | Temp 97.6°F | Resp 16 | Ht 64.0 in | Wt 156.0 lb

## 2016-11-02 DIAGNOSIS — I6330 Cerebral infarction due to thrombosis of unspecified cerebral artery: Secondary | ICD-10-CM

## 2016-11-02 DIAGNOSIS — N183 Chronic kidney disease, stage 3 unspecified: Secondary | ICD-10-CM | POA: Insufficient documentation

## 2016-11-02 DIAGNOSIS — I10 Essential (primary) hypertension: Secondary | ICD-10-CM

## 2016-11-02 DIAGNOSIS — M8580 Other specified disorders of bone density and structure, unspecified site: Secondary | ICD-10-CM

## 2016-11-02 DIAGNOSIS — R600 Localized edema: Secondary | ICD-10-CM

## 2016-11-02 DIAGNOSIS — E785 Hyperlipidemia, unspecified: Secondary | ICD-10-CM

## 2016-11-02 DIAGNOSIS — N184 Chronic kidney disease, stage 4 (severe): Secondary | ICD-10-CM | POA: Insufficient documentation

## 2016-11-02 LAB — COMPREHENSIVE METABOLIC PANEL
ALT: 21 U/L (ref 0–35)
AST: 19 U/L (ref 0–37)
Albumin: 4 g/dL (ref 3.5–5.2)
Alkaline Phosphatase: 140 U/L — ABNORMAL HIGH (ref 39–117)
BUN: 25 mg/dL — ABNORMAL HIGH (ref 6–23)
CO2: 30 mEq/L (ref 19–32)
Calcium: 9.7 mg/dL (ref 8.4–10.5)
Chloride: 105 mEq/L (ref 96–112)
Creatinine, Ser: 1.55 mg/dL — ABNORMAL HIGH (ref 0.40–1.20)
GFR: 34.52 mL/min — ABNORMAL LOW (ref >60.00)
Glucose, Bld: 100 mg/dL — ABNORMAL HIGH (ref 70–99)
Potassium: 4 mEq/L (ref 3.5–5.1)
Sodium: 142 mEq/L (ref 135–145)
Total Bilirubin: 0.6 mg/dL (ref 0.2–1.2)
Total Protein: 7.6 g/dL (ref 6.0–8.3)

## 2016-11-02 LAB — CBC WITH DIFFERENTIAL/PLATELET
Basophils Absolute: 0.1 10*3/uL (ref 0.0–0.1)
Basophils Relative: 0.7 % (ref 0.0–3.0)
Eosinophils Absolute: 0.2 10*3/uL (ref 0.0–0.7)
Eosinophils Relative: 2.9 % (ref 0.0–5.0)
HCT: 41.9 % (ref 36.0–46.0)
Hemoglobin: 14.5 g/dL (ref 12.0–15.0)
Lymphocytes Relative: 39.1 % (ref 12.0–46.0)
Lymphs Abs: 3.3 10*3/uL (ref 0.7–4.0)
MCHC: 34.6 g/dL (ref 30.0–36.0)
MCV: 96.5 fl (ref 78.0–100.0)
Monocytes Absolute: 0.5 10*3/uL (ref 0.1–1.0)
Monocytes Relative: 5.6 % (ref 3.0–12.0)
Neutro Abs: 4.4 10*3/uL (ref 1.4–7.7)
Neutrophils Relative %: 51.7 % (ref 43.0–77.0)
Platelets: 220 10*3/uL (ref 150.0–400.0)
RBC: 4.34 Mil/uL (ref 3.87–5.11)
RDW: 13.5 % (ref 11.5–15.5)
WBC: 8.5 10*3/uL (ref 4.0–10.5)

## 2016-11-02 LAB — LIPID PANEL
Cholesterol: 301 mg/dL — ABNORMAL HIGH (ref 0–200)
HDL: 56.9 mg/dL (ref >39.00)
NonHDL: 244.56
Total CHOL/HDL Ratio: 5
Triglycerides: 231 mg/dL — ABNORMAL HIGH (ref 0.0–149.0)
VLDL: 46.2 mg/dL — ABNORMAL HIGH (ref 0.0–40.0)

## 2016-11-02 LAB — VITAMIN D 25 HYDROXY (VIT D DEFICIENCY, FRACTURES): VITD: 41.02 ng/mL (ref 30.00–100.00)

## 2016-11-02 LAB — TSH: TSH: 1.67 u[IU]/mL (ref 0.35–4.50)

## 2016-11-02 LAB — LDL CHOLESTEROL, DIRECT: Direct LDL: 90 mg/dL

## 2016-11-02 MED ORDER — MULTIVITAMINS PO CAPS: 1.0000 | ORAL_CAPSULE | Freq: Every day | ORAL | Status: AC

## 2016-11-02 MED ORDER — VALSARTAN-HYDROCHLOROTHIAZIDE 160-12.5 MG PO TABS
1.0000 | ORAL_TABLET | Freq: Every day | ORAL | 3 refills | Status: DC
Start: 2016-11-02 — End: 2017-06-18

## 2016-11-02 MED ORDER — AMLODIPINE BESYLATE 5 MG PO TABS: 5.0000 mg | ORAL_TABLET | Freq: Every day | ORAL | 3 refills | Status: DC

## 2016-11-02 MED ORDER — CLOPIDOGREL BISULFATE 75 MG PO TABS: 75.0000 mg | ORAL_TABLET | Freq: Every day | ORAL | 3 refills | Status: DC

## 2016-11-02 NOTE — Patient Instructions (Addendum)
Increase your water intake.   Test(s) ordered today. Your results will be released to [place] (or called to you) after review, usually within 72hours after test completion. If any changes need to be made, you will be notified at that same time.  All other Health Maintenance issues reviewed.   All recommended immunizations and age-appropriate screenings are up-to-date or discussed.  No immunizations administered today.   Medications reviewed and updated.  Changes include start daily calcium - goal is 1000-1200 mg daily.  I will instruct you on how much vitamin D to take daily after your blood work  Your prescription(s) have been submitted to your pharmacy. Please take as directed and contact our office if you believe you are having problem(s) with the medication(s).   Please followup in one year for a physical/wellness visit.

## 2016-11-02 NOTE — Progress Notes (Signed)
Pre visit review using our clinic review tool, if applicable. No additional management support is needed unless otherwise documented below in the visit note. 

## 2016-11-02 NOTE — Progress Notes (Signed)
Subjective:    Patient ID: Shirley Wells, female    DOB: 06/05/1941, 75 y.o.   MRN: [id number]  HPI She is here to establish with a new pcp.    CVA:  She takes an ASA a couple of times a week - she had increased bruising when she took it daily and now only takes it a couple of times a week.  She has not taken the plavix for a couple of months - she states our office would not refill it, which I do not understand.   Hypertension: She is taking her medication daily. She is compliant with a low sodium diet.  She denies chest pain, palpitations, edema, shortness of breath and regular headaches. She is very active with house work and yard work and does not exercising regularly.  She does not monitor her blood pressure at home.    Edema:  She has leg swelling when she travels and her legs swell.  She then takes then, which is rare.   Medications and allergies reviewed with patient and updated if appropriate.  Patient Active Problem List   Diagnosis Date Noted  . Osteopenia 04/30/2014  . EDEMA 09/26/2010  . TRANSAMINASES, SERUM, ELEVATED 05/11/2010  . PEPTIC ULCER DISEASE 03/21/2010  . ARTHRITIS 03/21/2010  . CHICKENPOX, HX OF 03/21/2010  . Dyslipidemia 03/20/2010  . Essential hypertension 03/20/2010  . 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 amLODipine (NORVASC) 5 MG tablet Take 1 tablet (5 mg total) by mouth daily. 90 tablet 3  . aspirin 81 MG tablet Take 81 mg by mouth as needed.     . clopidogrel (PLAVIX) 75 MG tablet Take 1 tablet (75 mg total) by mouth daily. 90 tablet 3  . furosemide (LASIX) 20 MG tablet Take 20 mg by mouth daily as needed.      . loratadine (CLARITIN) 10 MG tablet Take 10 mg by mouth as needed.     . valsartan-hydrochlorothiazide (DIOVAN-HCT) 160-12.5 MG tablet Take 1 tablet by mouth daily. 90 tablet 0   No current facility-administered medications on file  prior to visit.     Past Medical History:  Diagnosis Date  . 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 Dyslipidemia    intol of statin due to >3x increase LFTs  . Hypertension, essential   . Osteoarthritis     Past Surgical History:  Procedure Laterality Date  . CHOLECYSTECTOMY  1981    Social History   Social History  . Marital status: Divorced    Spouse name: N/A  . Number of children: N/A  . Years of education: N/A   Social History Main Topics  . Smoking status: Never Smoker  . Smokeless tobacco: Not on file  . Alcohol use No  . Drug use: No  . Sexual activity: Not on file   Other Topics Concern  . Not on file   Social History Narrative   Divorced  >40 yrs, single and lives alone.    Supportive daughters, 1 is a Therapist, sports at [employer] [place]).    Patient enjoys gardening, dancing and caring for her grand children.    Family History  Problem Relation Age of Onset  . Arthritis Other   . Breast cancer Mother     Review of Systems  Constitutional: Negative for chills  and fever.  Respiratory: Negative for cough, shortness of breath and wheezing.   Cardiovascular: Negative for chest pain, palpitations and leg swelling (only when travels).  Gastrointestinal: Negative for abdominal pain, blood in stool, constipation, diarrhea and nausea.       No gerd  Neurological: Negative for dizziness, light-headedness and headaches.       Objective:   Vitals:   11/02/16 1039  BP: 134/78  Pulse: 63  Resp: 16  Temp: 97.6 F (36.4 C)   Filed Weights   11/02/16 1039  Weight: 156 lb (70.8 kg)   Body mass index is 26.78 kg/m.   Physical Exam Constitutional: Appears well-developed and well-nourished. No distress.  HENT:  Head: Normocephalic and atraumatic.  Neck: Neck supple. No tracheal deviation present. No thyromegaly present.  No cervical lymphadenopathy Cardiovascular:  Normal rate, regular rhythm and normal heart sounds.   No murmur heard. No carotid bruit .  No edema Pulmonary/Chest: Effort normal and breath sounds normal. No respiratory distress. No has no wheezes. No rales.  Skin: Skin is warm and dry. Not diaphoretic.  Psychiatric: Normal mood and affect. Behavior is normal.         Assessment & Plan:   See Problem List for Assessment and Plan of chronic medical problems.  F/u annually - does not want to come every 6 months, but advised that was best

## 2016-11-03 ENCOUNTER — Encounter: Payer: Self-pay | Admitting: Internal Medicine

## 2016-11-03 NOTE — Assessment & Plan Note (Signed)
cmp today Does not drink enough water - advised to increase water intake Does not take any nsaids other than ASA

## 2016-11-03 NOTE — Assessment & Plan Note (Signed)
Did not tolerate statin in past - had >3x LFT inc Did not want to repeat trial of statin Check lipids

## 2016-11-03 NOTE — Assessment & Plan Note (Signed)
Takes lasix rarely - only has leg swelling when she travels Continue same

## 2016-11-03 NOTE — Assessment & Plan Note (Addendum)
dexa up to date - due 07/2017 ? Taking enough vitamin d and calcium - alk phos elevated - likely due to bone resorption Check d level Taking a MVI daily - add calcium - goal 1000-1200 mg daily

## 2016-11-03 NOTE — Assessment & Plan Note (Addendum)
Stopped plavix - was not able to get a refill - will refill today - stressed taking it daily Does not tolerate ASA daily due to brusing - will try to take 2-3 times a week Did not tolerate statins in the past Check lipid panel, cbc

## 2016-11-03 NOTE — Assessment & Plan Note (Signed)
BP well controlled Current regimen effective and well tolerated Continue current medications at current doses Cmp, tsh 

## 2017-06-05 ENCOUNTER — Encounter: Payer: Self-pay | Admitting: Internal Medicine

## 2017-06-05 ENCOUNTER — Ambulatory Visit (INDEPENDENT_AMBULATORY_CARE_PROVIDER_SITE_OTHER): Payer: Medicare FFS | Admitting: Internal Medicine

## 2017-06-05 VITALS — BP 136/68 | HR 69 | Temp 97.7°F | Resp 16 | Wt 155.0 lb

## 2017-06-05 DIAGNOSIS — N6320 Unspecified lump in the left breast, unspecified quadrant: Secondary | ICD-10-CM

## 2017-06-05 NOTE — Assessment & Plan Note (Signed)
Lump in left breast 10:00-concerning for breast cancer She does have a history of breast cysts, but no other concerning breast history Last mammogram was several years ago-unknown where she had a mammogram, but around 30 years ago did have one [facility]ogram and left breast ultrasound ordered stat

## 2017-06-05 NOTE — Progress Notes (Signed)
Subjective:    Patient ID: Shirley Wells, female    DOB: [birth year], 76 y.o.   MRN: [id number]  HPI She is here for an acute visit.   Lump in left breast - She noticed a lump in her left breast around 2 weeks ago. She denies any pain. She noticed it because it caused an indentation in the breast. She denies any pain. She does have a history of breast cysts bilaterally. Years ago she had a mammogram at [facility] and the cysts were removed. She was advised to decrease her caffeine intake. Her last mammogram was years ago. She does not recall where she had that done.  She denies any changes in the skin or other concerns.  She does not drink excessive caffeine  Medications and allergies reviewed with patient and updated if appropriate.  Patient Active Problem List   Diagnosis Date Noted  . CKD (chronic kidney disease), stage III 11/02/2016  . Osteopenia 04/30/2014  . Edema 09/26/2010  . PEPTIC ULCER DISEASE 03/21/2010  . ARTHRITIS 03/21/2010  . Dyslipidemia 03/20/2010  . Essential hypertension 03/20/2010  . 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 amLODipine (NORVASC) 5 MG tablet Take 1 tablet (5 mg total) by mouth daily. 90 tablet 3  . aspirin 81 MG tablet Take 81 mg by mouth as needed.     . clopidogrel (PLAVIX) 75 MG tablet Take 1 tablet (75 mg total) by mouth daily. 90 tablet 3  . furosemide (LASIX) 20 MG tablet Take 20 mg by mouth daily as needed.      . loratadine (CLARITIN) 10 MG tablet Take 10 mg by mouth as needed.     . Multiple Vitamin (MULTIVITAMIN) capsule Take 1 capsule by mouth daily.    . valsartan-hydrochlorothiazide (DIOVAN-HCT) 160-12.5 MG tablet Take 1 tablet by mouth daily. 90 tablet 3   No current facility-administered medications on file prior to visit.     Past Medical History:  Diagnosis Date  . CHICKENPOX, HX OF 03/21/2010   Qualifier: Diagnosis of  By: Marca Ancona  RMA, Lucy    . 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 Dyslipidemia    intol of statin due to >3x increase LFTs  . Hypertension, essential   . Osteoarthritis     Past Surgical History:  Procedure Laterality Date  . CHOLECYSTECTOMY  1981    Social History   Social History  . Marital status: Divorced    Spouse name: N/A  . Number of children: N/A  . Years of education: N/A   Social History Main Topics  . Smoking status: Never Smoker  . Smokeless tobacco: Not on file  . Alcohol use No  . Drug use: No  . Sexual activity: Not on file   Other Topics Concern  . Not on file   Social History Narrative   Divorced  >40 yrs, single and lives alone.    Supportive daughters, 1 is a Therapist, sports at [employer] [place]).    Patient enjoys gardening, dancing and caring for her grand children.    Family History  Problem Relation Age of Onset  . Arthritis Other   . Breast cancer Mother     Review of Systems  Constitutional: Negative for fever.  Skin: Negative for color change and rash.  Neurological: Positive for numbness (Occasional  left arm usually associated with sleeping).       Objective:   Vitals:   06/05/17 1628  BP: 136/68  Pulse: 69  Resp: 16  Temp: 97.7 F (36.5 C)   Filed Weights   06/05/17 1628  Weight: 155 lb (70.3 kg)   Body mass index is 26.61 kg/m.  Wt Readings from Last 3 Encounters:  06/05/17 155 lb (70.3 kg)  11/02/16 156 lb (70.8 kg)  08/03/15 158 lb 8 oz (71.9 kg)     Physical Exam  Constitutional: She appears well-developed and well-nourished. No distress.  HENT:  Head: Normocephalic and atraumatic.  Pulmonary/Chest:  Left breast with visible and palpable lump at 10:00, nontender, approximately size of a quarter, no other palpable lumps, no skin changes, nipple discharge or axillary lymphadenopathy; right breast with increased breast tissue around 10:00,  but no discrete mass, no other lumps, no skin changes, nipple discharge or right axillary lymphadenopathy  Musculoskeletal: She exhibits no edema.  Skin: She is not diaphoretic.          Assessment & Plan:   See Problem List for Assessment and Plan of chronic medical problems.

## 2017-06-05 NOTE — Patient Instructions (Signed)
A mammogram and breast ultrasound was ordered.  Someone will call you to schedule this.

## 2017-06-17 ENCOUNTER — Ambulatory Visit
Admission: RE | Admit: 2017-06-17 | Discharge: 2017-06-17 | Disposition: A | Discharge status: Home / Self Care | Payer: Medicare FFS | Source: Ambulatory Visit | Attending: Internal Medicine | Admitting: Internal Medicine

## 2017-06-17 ENCOUNTER — Other Ambulatory Visit: Payer: Self-pay | Admitting: Internal Medicine

## 2017-06-17 DIAGNOSIS — N6320 Unspecified lump in the left breast, unspecified quadrant: Secondary | ICD-10-CM

## 2017-06-17 NOTE — Telephone Encounter (Signed)
Pt called and needs a refill of valsartan-hydrochlorothiazide (DIOVAN-HCT) 160-12.5 MG ta But she does not want that she would like something else called in   [place]

## 2017-06-18 ENCOUNTER — Ambulatory Visit
Admission: RE | Admit: 2017-06-18 | Discharge: 2017-06-18 | Disposition: A | Discharge status: Home / Self Care | Payer: Medicare FFS | Source: Ambulatory Visit | Attending: Internal Medicine | Admitting: Internal Medicine

## 2017-06-18 ENCOUNTER — Encounter (INDEPENDENT_AMBULATORY_CARE_PROVIDER_SITE_OTHER): Payer: Self-pay

## 2017-06-18 ENCOUNTER — Other Ambulatory Visit: Payer: Self-pay | Admitting: Internal Medicine

## 2017-06-18 DIAGNOSIS — N6320 Unspecified lump in the left breast, unspecified quadrant: Secondary | ICD-10-CM

## 2017-06-18 MED ORDER — LOSARTAN POTASSIUM-HCTZ 100-12.5 MG PO TABS: 1.0000 | ORAL_TABLET | Freq: Every day | ORAL | 1 refills | Status: DC

## 2017-06-18 NOTE — Telephone Encounter (Signed)
Spoke with pt, she would like it sent to [facility] in [place].

## 2017-06-18 NOTE — Telephone Encounter (Signed)
New med pending  -- there are [facility] in [place]

## 2017-06-20 ENCOUNTER — Encounter: Payer: Self-pay | Admitting: *Deleted

## 2017-06-20 ENCOUNTER — Other Ambulatory Visit: Payer: Self-pay | Admitting: Internal Medicine

## 2017-06-20 ENCOUNTER — Telehealth: Payer: Self-pay | Admitting: *Deleted

## 2017-06-20 DIAGNOSIS — C50212 Malignant neoplasm of upper-inner quadrant of left female breast: Secondary | ICD-10-CM

## 2017-06-20 DIAGNOSIS — Z170 Estrogen receptor positive status [ER+]: Secondary | ICD-10-CM | POA: Insufficient documentation

## 2017-06-20 HISTORY — DX: Estrogen receptor positive status (ER+): C50.212

## 2017-06-20 HISTORY — DX: Estrogen receptor positive status (ER+): Z17.0

## 2017-06-20 NOTE — Telephone Encounter (Signed)
MD is out of office will forward to her desktop and wait for her response whn she return on Monday 07/25/17...Shirley Wells

## 2017-06-20 NOTE — Telephone Encounter (Signed)
Confirmed [facility] for 06/26/17 at 0815 .  Instructions and contact information given.

## 2017-06-20 NOTE — Telephone Encounter (Signed)
Pt called in and said that the losartan-hydrochlorothiazide (HYZAAR) 100-12.5 MG tablet [[id number]  Has side effect that causes lupus and she dod not want to take anything that causes that.  Most of the women in her family have that.  She wants to be switched to something else ?

## 2017-06-21 MED ORDER — CARVEDILOL 6.25 MG PO TABS: 6.2500 mg | ORAL_TABLET | Freq: Two times a day (BID) | ORAL | 3 refills | Status: DC

## 2017-06-21 MED ORDER — AMLODIPINE BESYLATE 10 MG PO TABS: 10.0000 mg | ORAL_TABLET | Freq: Every day | ORAL | 3 refills | Status: DC

## 2017-06-21 NOTE — Telephone Encounter (Signed)
Ok but she was on valsartan-hctz which is the same type of medication.     Have her increase amlodipine to 10 mg .  This may cause some ankle swelling.   We also need to add another medication - start coreg 6.25 mg twice daily.  meds pending - if she is ok with these please send in.  She needs to come in and have her BP rechecked soon to make sure it is controlled on the new medication.

## 2017-06-21 NOTE — Telephone Encounter (Signed)
Notified pt w/MD response. Pt agreed to starting medication. She states she will call back next week once she find out when she will have her surgery. She has a consultation on Wednesday. Verified pharmacy [facility]...Johny Chess

## 2017-06-24 NOTE — Progress Notes (Signed)
[place]  Telephone:(336) [phone] Fax:([phone]) [place] Note   Patient Care Team: Binnie Rail, MD as PCP - General (Internal Medicine) Garvin Fila, MD as Consulting Physician (Neurology) Rolm Bookbinder, MD as Consulting Physician (General Surgery) Truitt Merle, MD as Consulting Physician (Hematology) Kyung Rudd, MD as Consulting Physician (Radiation Oncology) 06/26/2017  CHIEF COMPLAINTS/PURPOSE OF CONSULTATION:  Consultation for Malignant neoplasm of upper-inner quadrant of left breast in female, estrogen receptor positive  Oncology History   Cancer Staging Malignant neoplasm of upper-inner quadrant of left breast in female, estrogen receptor positive ([place]) Staging form: Breast, AJCC 8th Edition - Clinical stage from 06/18/2017: Stage IB (cT2, cN0, cM0, G2, ER: Positive, PR: Positive, HER2: Negative) - Signed by Truitt Merle, MD on 06/24/2017       Malignant neoplasm of upper-inner quadrant of left breast in female, estrogen receptor positive ([place])   06/17/2017 Mammogram    [place] and MM diagnostic Breast Tomo Bilateral  IMPRESSION: 1. 3.4 palpable mass in the 10 o'clock position of the left breast is highly suspicious for malignancy. 2. No evidence of malignancy in the right breast. 3. Ultrasound of the left axilla shows normal sized axillary lymph nodes.      06/18/2017 Initial Biopsy    Diagnosis 06/18/17 Breast, left, needle core biopsy, 10:00 o'clock - INVASIVE MAMMARY CARCINOMA. At least G2  The malignant cells are negative for E-Cadherin, supporting a lobular phenotype.      06/18/2017 Initial Diagnosis    Malignant neoplasm of upper-inner quadrant of left breast in female, estrogen receptor positive ([place])      06/18/2017 Receptors her2    ER 95%+, PR 25%+, both strong staining  HER2- Ki67 30%         HISTORY OF PRESENTING ILLNESS: 06/26/17 Shirley Wells 76 y.o. female is here because of newly diagnosed Malignant neoplasm  of upper-inner quadrant of left breast in female, estrogen receptor positive.  She presents to the breast clinic with her two daughters.  She felt her mass a few weeks ago. She did not have pain with it or any nipple discharge. She did see her a dimple in her skin. She had not had mammogram in several years. She had a mammogram 06/17/14 showing the mass ad negative lymph nodes from the [place]. She had a following biopsy 06/18/17 that shows evidence of invasive mammary carcinoma.   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 She drove to [place] but does not remember it at all and her BP was extremely high. Had to take time to regain normal speech and memory.   Today she wants to know what caused her cancer. She wants to know if her BP medication Losartan that has recently been recalled is the cause. She currently has no significant weight loss, back or hip pain. 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 She lives alone and takes care of herself. Her daughters live near by and sees her. She is a Public librarian. She is active with exercises and does yard work. She was bad on her menopause but took natural remedies and hot flashes stopped. She does not feel comfortable with taking more medication.     GYN HISTORY  Menarchal: 13 LMP: 42-44 Contraceptive: No HRT: No hormonal replacements  GP: G4P4   MEDICAL HISTORY:  Past Medical History:  Diagnosis Date  . CHICKENPOX, HX OF 03/21/2010   Qualifier: Diagnosis of  By: Marca Ancona  RMA, Lucy    . 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 Dyslipidemia    intol of statin due to >3x increase LFTs  . Hypertension, essential   . Malignant neoplasm of upper-inner quadrant of left breast in female, estrogen receptor positive ([place]) 06/20/2017  . Osteoarthritis   . Stroke [facility])     SURGICAL HISTORY: Past Surgical History:  Procedure Laterality Date   . CHOLECYSTECTOMY  1981    SOCIAL HISTORY: Social History   Social History  . Marital status: Divorced    Spouse name: N/A  . Number of children: N/A  . Years of education: N/A   Occupational History  . Not on file.   Social History Main Topics  . Smoking status: Never Smoker  . Smokeless tobacco: Never Used  . Alcohol use No  . Drug use: No  . Sexual activity: Not on file   Other Topics Concern  . Not on file   Social History Narrative   Divorced  >40 yrs, single and lives alone.    Supportive daughters, 1 is a Therapist, sports at [employer]).    Patient enjoys gardening, dancing and caring for her grand children.    FAMILY HISTORY: Family History  Problem Relation Age of Onset  . Breast cancer Mother 4  . Arthritis Other   . Breast cancer Maternal Aunt     ALLERGIES:  is allergic to influenza vaccines; pneumococcal vaccines; simvastatin; tetanus toxoids; and zoster vaccine live.  MEDICATIONS:  Current Outpatient Prescriptions  Medication Sig Dispense Refill  . amLODipine (NORVASC) 10 MG tablet Take 1 tablet (10 mg total) by mouth daily. (Patient taking differently: Take 5 mg by mouth daily. ) 30 tablet 3  . aspirin 81 MG tablet Take 81 mg by mouth as needed.     . carvedilol (COREG) 6.25 MG tablet Take 1 tablet (6.25 mg total) by mouth 2 (two) times daily with a meal. (Patient taking differently: Take 6.25 mg by mouth daily. ) 60 tablet 3  . clopidogrel (PLAVIX) 75 MG tablet Take 1 tablet (75 mg total) by mouth daily. 90 tablet 3  . furosemide (LASIX) 20 MG tablet Take 20 mg by mouth daily as needed.      . loratadine (CLARITIN) 10 MG tablet Take 10 mg by mouth as needed.     . Multiple Vitamin (MULTIVITAMIN) capsule Take 1 capsule by mouth daily.     No current facility-administered medications for this visit.     REVIEW OF SYSTEMS:   Constitutional: Denies fevers, chills or abnormal night sweats Eyes: Denies blurriness of vision, double vision or watery  eyes Ears, nose, mouth, throat, and face: Denies mucositis or sore throat Respiratory: Denies cough, dyspnea or wheezes Cardiovascular: Denies palpitation, chest discomfort or lower extremity swelling Gastrointestinal:  Denies nausea, heartburn or change in bowel habits Skin: Denies abnormal skin rashes Lymphatics: Denies new lymphadenopathy or easy bruising Neurological:Denies numbness, tingling or new weaknesses MSK: (+) Arthritis in hands Behavioral/Psych: Mood is stable, no new changes  Breast: (+) Palpable nodule in left breast  All other systems were reviewed with the patient and are negative.   PHYSICAL EXAMINATION: ECOG PERFORMANCE STATUS: 0 - Asymptomatic  Vitals:   06/26/17 0906  BP: (!) 180/78  Pulse: 67  Resp: 17  Temp: 97.8 F (36.6 C)   Filed Weights   06/26/17 0906  Weight: 157 lb 14.4 oz (71.6 kg)  GENERAL:alert, no distress and comfortable SKIN: skin color, texture, turgor are normal, no rashes or significant lesions EYES: normal, conjunctiva are pink and non-injected, sclera clear OROPHARYNX:no exudate, no erythema and lips, buccal mucosa, and tongue normal  NECK: supple, thyroid normal size, non-tender, without nodularity LYMPH:  no palpable lymphadenopathy in the cervical, axillary or inguinal LUNGS: clear to auscultation and percussion with normal breathing effort HEART: regular rate & rhythm and no murmurs (+) swelling in feet L>R ABDOMEN:abdomen soft, non-tender and normal bowel sounds Musculoskeletal:no cyanosis of digits and no clubbing  PSYCH: alert & oriented x 3 with fluent speech NEURO: no focal motor/sensory deficits BREAST: (+) 3 x 2.5 cm mass in UIQ in left breast, firm, ecchymosis from biopsy. No other palpable mass or adenopathy.   LABORATORY DATA:  I have reviewed the data as listed CBC Latest Ref Rng & Units 06/26/2017 11/02/2016 08/03/2015  WBC 3.9 - 10.3 10e3/uL 6.4 8.5 9.0  Hemoglobin 11.6 - 15.9 g/dL 12.9 14.5 14.4  Hematocrit 34.8  - 46.6 % 39.1 41.9 42.2  Platelets 145 - 400 10e3/uL 195 220.0 207.0    CMP Latest Ref Rng & Units 06/26/2017 11/02/2016 08/03/2015  Glucose 70 - 140 mg/dl 81 100(H) 85  BUN 7.0 - 26.0 mg/dL 23.6 25(H) 25(H)  Creatinine 0.6 - 1.1 mg/dL 1.4(H) 1.55(H) 1.39(H)  Sodium 136 - 145 mEq/L 142 142 141  Potassium 3.5 - 5.1 mEq/L 4.3 4.0 3.5  Chloride 96 - 112 mEq/L - 105 103  CO2 22 - 29 mEq/L 25 30 30   Calcium 8.4 - 10.4 mg/dL 9.5 9.7 9.3  Total Protein 6.4 - 8.3 g/dL 7.2 7.6 7.6  Total Bilirubin 0.20 - 1.20 mg/dL 0.41 0.6 0.6  Alkaline Phos 40 - 150 U/L 144 140(H) 148(H)  AST 5 - 34 U/L 27 19 22   ALT 0 - 55 U/L 31 21 25    PATHOLOGY  Diagnosis 06/18/17 Breast, left, needle core biopsy, 10:00 o'clock - INVASIVE MAMMARY CARCINOMA. - SEE COMMENT. Microscopic Comment The carcinoma appears at least grade 2. An E-Cadherin stain and a breast prognostic profile will be performed and the results reported separately. The results were called to [facility] on 06/19/17. (JBK:gt, 06/19/17) Results: HER2 - NEGATIVE RATIO OF HER2/CEP17 SIGNALS 1.72 AVERAGE HER2 COPY NUMBER PER CELL 3.88 Results: IMMUNOHISTOCHEMICAL AND MORPHOMETRIC ANALYSIS PERFORMED MANUALLY Estrogen Receptor: 95%, POSITIVE, STRONG STAINING INTENSITY Progesterone Receptor: 25%, POSITIVE, STRONG STAINING INTENSITY Proliferation Marker Ki67: 30%   RADIOGRAPHIC STUDIES: I have personally reviewed the radiological images as listed and agreed with the findings in the report. US Breast Ltd Uni Left Inc Axilla  Result Date: 06/17/2017 CLINICAL DATA:  75 year old patient presents for evaluation of a firm palpable mass in the upper inner quadrant of the left breast. Per her report, she has not had a mammogram in approximately 25 years. There are no prior available mammograms. EXAM: 2D DIGITAL DIAGNOSTIC BILATERAL MAMMOGRAM WITH CAD AND ADJUNCT TOMO ULTRASOUND LEFT BREAST COMPARISON:  None available ACR Breast Density Category  c: The breast tissue is heterogeneously dense, which may obscure small masses. FINDINGS: Images are of the best to the technologist stability. The patient declined the standard amount of compression due to pain. There is an irregular mass in the upper inner quadrant of the left breast, the posterior margin of which is deep to the image. This mass corresponds to the palpable area of concern, as denoted by the overlying metallic skin marker. There are scattered calcified fibroadenomas in the left breast.  No suspicious masses identified in the right breast. No suspicious mass or distortion is identified in either breast. Mammographic images were processed with CAD. On physical exam, there is a firm, fixed mass extending very superficially in the upper inner quadrant left breast, centered at 10 o'clock position 6 cm from the nipple. The mass on physical exam feels to be approximately 4 cm. The overlying skin is intact. No mass is palpated in the left axilla. Targeted ultrasound is performed, showing a hypoechoic markedly irregular angular mass at 10 o'clock position 6 cm from nipple that contains internal vascular flow. This superficial margin of the mass is very close to, and may abut the dermis. The overlying skin is normal in thickness. The deep margin of the mass is adjacent to the pectoralis muscle. The mass measures 3.4 x 3.2 x 2.8 cm on ultrasound. The surrounding breast parenchyma is negative. Ultrasound of the left axilla shows normal sized axillary lymph nodes. IMPRESSION: 3.4 palpable mass in the 10 o'clock position of the left breast is highly suspicious for malignancy. No evidence of malignancy in the right breast. The findings were discussed in detail with the patient and one of her daughters who was present for the ultrasound. RECOMMENDATION: Ultrasound-guided core needle biopsy of the palpable mass is recommended and has been scheduled for June 18, 2017. I have discussed the findings and recommendations  with the patient. Results were also provided in writing at the conclusion of the visit. If applicable, a reminder letter will be sent to the patient regarding the next appointment. BI-RADS CATEGORY  5: Highly suggestive of malignancy. Electronically Signed   By: Curlene Dolphin M.D.   On: 06/17/2017 13:02   Mm Diag Breast Tomo Bilateral  Result Date: 06/17/2017 CLINICAL DATA:  76 year old patient presents for evaluation of a firm palpable mass in the upper inner quadrant of the left breast. Per her report, she has not had a mammogram in approximately 25 years. There are no prior available mammograms. EXAM: 2D DIGITAL DIAGNOSTIC BILATERAL MAMMOGRAM WITH CAD AND ADJUNCT TOMO ULTRASOUND LEFT BREAST COMPARISON:  None available ACR Breast Density Category c: The breast tissue is heterogeneously dense, which may obscure small masses. FINDINGS: Images are of the best to the technologist stability. The patient declined the standard amount of compression due to pain. There is an irregular mass in the upper inner quadrant of the left breast, the posterior margin of which is deep to the image. This mass corresponds to the palpable area of concern, as denoted by the overlying metallic skin marker. There are scattered calcified fibroadenomas in the left breast. No suspicious masses identified in the right breast. No suspicious mass or distortion is identified in either breast. Mammographic images were processed with CAD. On physical exam, there is a firm, fixed mass extending very superficially in the upper inner quadrant left breast, centered at 10 o'clock position 6 cm from the nipple. The mass on physical exam feels to be approximately 4 cm. The overlying skin is intact. No mass is palpated in the left axilla. Targeted ultrasound is performed, showing a hypoechoic markedly irregular angular mass at 10 o'clock position 6 cm from nipple that contains internal vascular flow. This superficial margin of the mass is very close  to, and may abut the dermis. The overlying skin is normal in thickness. The deep margin of the mass is adjacent to the pectoralis muscle. The mass measures 3.4 x 3.2 x 2.8 cm on ultrasound. The surrounding breast parenchyma is negative. Ultrasound of  the left axilla shows normal sized axillary lymph nodes. IMPRESSION: 3.4 palpable mass in the 10 o'clock position of the left breast is highly suspicious for malignancy. No evidence of malignancy in the right breast. The findings were discussed in detail with the patient and one of her daughters who was present for the ultrasound. RECOMMENDATION: Ultrasound-guided core needle biopsy of the palpable mass is recommended and has been scheduled for June 18, 2017. I have discussed the findings and recommendations with the patient. Results were also provided in writing at the conclusion of the visit. If applicable, a reminder letter will be sent to the patient regarding the next appointment. BI-RADS CATEGORY  5: Highly suggestive of malignancy. Electronically Signed   By: Curlene Dolphin M.D.   On: 06/17/2017 13:02   Mm Clip Placement Left  Result Date: 06/18/2017 CLINICAL DATA:  Post ultrasound-guided biopsy of a mass in the left breast at the 10 o'clock position. EXAM: DIAGNOSTIC LEFT MAMMOGRAM POST ULTRASOUND BIOPSY COMPARISON:  Previous exam(s). FINDINGS: Mammographic images were obtained following ultrasound guided biopsy of a mass in the left breast at the 10 o'clock position. A ribbon shaped biopsy marking clip is present at the site of the biopsied mass in the upper inner left breast. IMPRESSION: Ribbon shaped biopsy marking clip appropriately positioned at site of biopsied mass in the upper inner left breast at 10 o'clock. Final Assessment: Post Procedure Mammograms for Marker Placement Electronically Signed   By: Everlean Alstrom M.D.   On: 06/18/2017 16:59   [place] Lt Breast Bx W Loc Dev 1st Lesion Img Bx Spec US Guide  Addendum Date: 06/19/2017   ADDENDUM  REPORT: 06/19/2017 15:19 ADDENDUM: Pathology revealed GRADE II INVASIVE MAMMARY CARCINOMA of the Left breast, 10:00 o'clock. This was found to be concordant by Dr. Everlean Alstrom. Pathology results were discussed with the patient by telephone. The patient reported doing well after the biopsy with tenderness at the site. Post biopsy instructions and care were reviewed and questions were answered. The patient was encouraged to call [facility] for any additional concerns. The patient was referred to [facility] on June 26, 2017. Pathology results reported by Terie Purser, RN on 06/19/2017. Electronically Signed   By: Everlean Alstrom M.D.   On: 06/19/2017 15:19   Result Date: 06/19/2017 CLINICAL DATA:  76 year old female with a highly suspicious mass in the left breast at the 10 o'clock position. EXAM: ULTRASOUND GUIDED LEFT BREAST CORE NEEDLE BIOPSY COMPARISON:  Previous exam(s). FINDINGS: I met with the patient and we discussed the procedure of ultrasound-guided biopsy, including benefits and alternatives. We discussed the high likelihood of a successful procedure. We discussed the risks of the procedure, including infection, bleeding, tissue injury, clip migration, and inadequate sampling. Informed written consent was given. The usual time-out protocol was performed immediately prior to the procedure. Lesion quadrant: Upper inner Using sterile technique and 1% Lidocaine as local anesthetic, under direct ultrasound visualization, a 14 gauge spring-loaded device was used to perform biopsy of the mass in the left breast at the 10 o'clock position using a medial to lateral approach. At the conclusion of the procedure a ribbon shaped tissue marker clip was deployed into the biopsy cavity. Follow up 2 view mammogram was performed and dictated separately. IMPRESSION: Ultrasound guided biopsy of the mass in the left  breast the 10 o'clock position. No apparent complications. Electronically Signed: By: Everlean Alstrom M.D. On: 06/18/2017 16:58  ASSESSMENT & PLAN:  Shirley Wells is a 76 y.o. caucasian female with a history of osteoarthritis, HTN, Dyslipidemia and CVA, presented with a palpable left breast mass.  1. Malignant neoplasm of upper-inner quadrant of left breast in female, invasive lobular carcinoma, cT2N0M0, Stage 1b, ER/PR positive, HER2 negative, Grade 2 --We discussed her imaging findings and the biopsy results in great details. -Giving the size and location of her breast cancer, she likely need a mastectomy and sentinel lymph node biopsy. She is agreeable with that. She was seen by Dr. Donne Hazel today and likely will proceed with surgery soon.  -I recommend a Oncotype Dx test on the surgical sample and we'll make a decision about adjuvant chemotherapy based on the Oncotype result. Written material of this test was given to her. She is elderly but fit, and may need chemotherapy if her Oncotype recurrence score is high. -If her surgical sentinel lymph node node positive, I recommend mammaprint for further risk stratification and guide adjuvant chemotherapy. -Giving the strong ER and PR expression and her postmenopausal status, I recommend adjuvant endocrine therapy with aromatase inhibitor for a total of 5-10 years to reduce the risk of cancer recurrence. Giving lobular histology, I'll recommend 7-10 years aromatase inhibitor if she tolerates well. Potential benefits and side effects were discussed with patient and she is interested. -She was also seen by radiation oncologist Dr. Lisbeth Renshaw today. If her surgical sentinel lymph nodes were negative, she may or may not need post mastectomy radiation depends on the tumor size.  -We also discussed the breast cancer surveillance after her surgery. She will continue annual screening mammogram, self exam, and a routine office visit with lab and exam with [place]. -I  encouraged her to have healthy diet and exercise regularly.  -She will proceed with surgery and  oncotype/mammaprint testing to see if she needs chemotherapy, then possible radiation. She will then go on aromatase inhibitor.  -F/u after surgery   2. History of Stroke -prior major blockage in basal artery, patient declined surgical intervention. -She is on baby aspirin and Plavix     3. Genetics -Due to her strong familial history of breast cancer I recommend she gets tested and her daughters to know -Her daughters would like her to get tested, pt agrees -Will refer to genetics  4. HTN -Continue medication. She will follow-up with her primary care physician  PLAN:  -Refer to Genetics -She will proceed with left mastectomy and sentinel lymph node biopsy by Dr. Donne Hazel soon -We plan to send Oncotype if node negative, or mammaprint if node positive, on her surgical tumor tissue. -Follow up after surgery to review the test results and finalize her antiestrogen therapy.    No orders of the defined types were placed in this encounter.   All questions were answered. The patient knows to call the clinic with any problems, questions or concerns. I spent 55 minutes counseling the patient face to face. The total time spent in the appointment was 60 minutes and more than 50% was on counseling.  This document serves as a record of services personally performed by Truitt Merle, MD. It was created on her behalf by Joslyn Devon, a trained medical scribe. The creation of this record is based on the scribe's personal observations and the provider's statements to them. This document has been checked and approved by the attending provider.     Truitt Merle, MD 06/26/2017 6:28 PM

## 2017-06-26 ENCOUNTER — Ambulatory Visit: Payer: Medicare FFS | Attending: General Surgery | Admitting: Physical Therapy

## 2017-06-26 ENCOUNTER — Encounter: Payer: Self-pay | Admitting: *Deleted

## 2017-06-26 ENCOUNTER — Ambulatory Visit (HOSPITAL_BASED_OUTPATIENT_CLINIC_OR_DEPARTMENT_OTHER): Payer: Medicare FFS | Admitting: Hematology

## 2017-06-26 ENCOUNTER — Ambulatory Visit
Admission: RE | Admit: 2017-06-26 | Discharge: 2017-06-26 | Disposition: A | Discharge status: Home / Self Care | Payer: Medicare FFS | Source: Ambulatory Visit | Attending: Radiation Oncology | Admitting: Radiation Oncology

## 2017-06-26 ENCOUNTER — Encounter: Payer: Self-pay | Admitting: Hematology

## 2017-06-26 ENCOUNTER — Encounter: Payer: Self-pay | Admitting: Physical Therapy

## 2017-06-26 ENCOUNTER — Other Ambulatory Visit (HOSPITAL_BASED_OUTPATIENT_CLINIC_OR_DEPARTMENT_OTHER): Payer: Medicare FFS

## 2017-06-26 ENCOUNTER — Other Ambulatory Visit: Payer: Self-pay | Admitting: General Surgery

## 2017-06-26 VITALS — BP 180/78 | HR 67 | Temp 97.8°F | Resp 17 | Ht 64.0 in | Wt 157.9 lb

## 2017-06-26 DIAGNOSIS — Z170 Estrogen receptor positive status [ER+]: Secondary | ICD-10-CM | POA: Insufficient documentation

## 2017-06-26 DIAGNOSIS — C50212 Malignant neoplasm of upper-inner quadrant of left female breast: Secondary | ICD-10-CM

## 2017-06-26 DIAGNOSIS — I10 Essential (primary) hypertension: Secondary | ICD-10-CM

## 2017-06-26 DIAGNOSIS — R293 Abnormal posture: Secondary | ICD-10-CM | POA: Diagnosis present

## 2017-06-26 DIAGNOSIS — Z8673 Personal history of transient ischemic attack (TIA), and cerebral infarction without residual deficits: Secondary | ICD-10-CM | POA: Diagnosis not present

## 2017-06-26 LAB — CBC WITH DIFFERENTIAL/PLATELET
BASO%: 0.9 % (ref 0.0–2.0)
Basophils Absolute: 0.1 10*3/uL (ref 0.0–0.1)
EOS%: 6.7 % (ref 0.0–7.0)
Eosinophils Absolute: 0.4 10*3/uL (ref 0.0–0.5)
HCT: 39.1 % (ref 34.8–46.6)
HGB: 12.9 g/dL (ref 11.6–15.9)
LYMPH%: 32.9 % (ref 14.0–49.7)
MCH: 32.9 pg (ref 25.1–34.0)
MCHC: 33 g/dL (ref 31.5–36.0)
MCV: 99.7 fL (ref 79.5–101.0)
MONO#: 0.7 10*3/uL (ref 0.1–0.9)
MONO%: 10.3 % (ref 0.0–14.0)
NEUT#: 3.1 10*3/uL (ref 1.5–6.5)
NEUT%: 49.2 % (ref 38.4–76.8)
Platelets: 195 10*3/uL (ref 145–400)
RBC: 3.92 10*6/uL (ref 3.70–5.45)
RDW: 13.5 % (ref 11.2–14.5)
WBC: 6.4 10*3/uL (ref 3.9–10.3)
lymph#: 2.1 10*3/uL (ref 0.9–3.3)

## 2017-06-26 LAB — COMPREHENSIVE METABOLIC PANEL
ALT: 31 U/L (ref 0–55)
AST: 27 U/L (ref 5–34)
Albumin: 3.5 g/dL (ref 3.5–5.0)
Alkaline Phosphatase: 144 U/L (ref 40–150)
Anion Gap: 9 mEq/L (ref 3–11)
BUN: 23.6 mg/dL (ref 7.0–26.0)
CO2: 25 mEq/L (ref 22–29)
Calcium: 9.5 mg/dL (ref 8.4–10.4)
Chloride: 108 mEq/L (ref 98–109)
Creatinine: 1.4 mg/dL — ABNORMAL HIGH (ref 0.6–1.1)
EGFR: 36 mL/min/{1.73_m2} — ABNORMAL LOW (ref >90)
Glucose: 81 mg/dl (ref 70–140)
Potassium: 4.3 mEq/L (ref 3.5–5.1)
Sodium: 142 mEq/L (ref 136–145)
Total Bilirubin: 0.41 mg/dL (ref 0.20–1.20)
Total Protein: 7.2 g/dL (ref 6.4–8.3)

## 2017-06-26 NOTE — Progress Notes (Signed)
Clinical Social Work [place] Psychosocial Distress Screening [place]  Patient completed distress screening protocol and scored a 5 on the Psychosocial Distress Thermometer which indicates moderate distress. Clinical Social Worker met with patient and patients family in [facility] to assess for distress and other psychosocial needs. Patient stated she was feeling overwhelmed but felt "better" after meeting with the treatment team and getting more information on her treatment plan. CSW and patient discussed common feeling and emotions when being diagnosed with cancer, and the importance of support during treatment. CSW informed patient of the support team and support services at [facility]. CSW provided contact information and encouraged patient to call with any questions or concerns.  ONCBCN DISTRESS SCREENING 06/26/2017  Screening Type Initial Screening  Distress experienced in past week (1-10) 5  Information Concerns Type Lack of info about diagnosis;Lack of info about treatment  Physician notified of physical symptoms Yes     Johnnye Lana, MSW, LCSW, OSW-C Clinical Social Worker [facility] [phone]

## 2017-06-26 NOTE — Therapy (Signed)
[place], [place], [address] Phone: [phone]   Fax:  [phone]  Physical Therapy Evaluation  Patient Details  Name: Shirley Wells MRN: [id number] Date of Birth: [birth year] Referring Provider: Dr. Rolm Bookbinder  Encounter Date: 06/26/2017      PT End of Session - 06/26/17 1057    Visit Number 1   Number of Visits 1   PT Start Time 0956   PT Stop Time 1020   PT Time Calculation (min) 24 min   Activity Tolerance Patient tolerated treatment well   Behavior During Therapy [facility] for tasks assessed/performed      Past Medical History:  Diagnosis Date  . CH[place]EN[place], HX OF 03/21/2010   Qualifier: Diagnosis of  By: Marca Ancona RMA, Lucy    . 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 Dyslipidemia    intol of statin due to >3x increase LFTs  . Hypertension, essential   . Malignant neoplasm of upper-inner quadrant of left breast in female, estrogen receptor positive ([place]) 06/20/2017  . Osteoarthritis   . Stroke [facility])     Past Surgical History:  Procedure Laterality Date  . CHOLECYSTECTOMY  1981    There were no vitals filed for this visit.       Subjective Assessment - 06/26/17 1049    Subjective Patient reports she is here today to meet her medical team for her newly diagnosed left breast cancer.   Patient is accompained by: Family member   Pertinent History Patient was diagnosed on 06/17/17 with left grade 2 invasive ductal carcinoma breast cancer. It measures 3.4 cm and is located in the upper inner quadrant of her left breast. It is ER/PR positive and HER2 negative with a ki67 of 30%. She has a history of a CVA and hypertension.   Patient Stated Goals Reduce lymphedema risk and learn post op shoulder ROM HEP.   Currently in Pain? No/denies            [facility] PT Assessment - 06/26/17 0001      Assessment    Medical Diagnosis Left breast cancer   Referring Provider Dr. Rolm Bookbinder   Onset Date/Surgical Date 06/17/17   Hand Dominance Right   Prior Therapy none     Precautions   Precautions Other (comment)   Precaution Comments active cancer     Restrictions   Weight Bearing Restrictions No     Balance Screen   Has the patient fallen in the past 6 months No   Has the patient had a decrease in activity level because of a fear of falling?  No   Is the patient reluctant to leave their home because of a fear of falling?  No     Home Environment   Living Environment Private residence   Living Arrangements Alone   Available Help at Discharge Family     Prior Function   Level of Independence Independent   Vocation Part time employment   Vocation Requirements [employer]   Leisure She does gardening but no formal exercise     Cognition   Overall Cognitive Status Within Functional Limits for tasks assessed     Posture/Postural Control   Posture/Postural Control Postural limitations   Postural Limitations Forward head;Rounded Shoulders;Increased thoracic kyphosis     ROM / Strength   AROM / PROM / Strength AROM;Strength  AROM   AROM Assessment Site Shoulder;Cervical   Right/Left Shoulder Right;Left   Right Shoulder Extension 53 Degrees   Right Shoulder Flexion 161 Degrees   Right Shoulder ABduction 165 Degrees   Right Shoulder Internal Rotation 75 Degrees   Right Shoulder External Rotation 80 Degrees   Left Shoulder Extension 58 Degrees   Left Shoulder Flexion 149 Degrees   Left Shoulder ABduction 156 Degrees   Left Shoulder Internal Rotation 75 Degrees   Left Shoulder External Rotation 76 Degrees   Cervical Flexion WNL   Cervical Extension WNL   Cervical - Right Side Bend WNL   Cervical - Left Side Bend WNL   Cervical - Right Rotation WNL   Cervical - Left Rotation WNL     Strength   Overall Strength Within functional limits for tasks performed            LYMPHEDEMA/ONCOLOGY QUESTIONNAIRE - 06/26/17 1055      Type   Cancer Type Left breast cancer     Lymphedema Assessments   Lymphedema Assessments Upper extremities     Right Upper Extremity Lymphedema   10 cm Proximal to Olecranon Process 28.5 cm   Olecranon Process 23.8 cm   10 cm Proximal to Ulnar Styloid Process 23.3 cm   Just Proximal to Ulnar Styloid Process 15.6 cm   Across Hand at [employer] 18.2 cm   At [place] of 2nd Digit 6 cm     Left Upper Extremity Lymphedema   10 cm Proximal to Olecranon Process 29.5 cm   Olecranon Process 23.8 cm   10 cm Proximal to Ulnar Styloid Process 23.1 cm   Just Proximal to Ulnar Styloid Process 15.2 cm   Across Hand at [employer] 17.6 cm   At [place] of 2nd Digit 5.5 cm         Objective measurements completed on examination: See above findings.      Patient was instructed today in a home exercise program today for post op shoulder range of motion. These included active assist shoulder flexion in sitting, scapular retraction, wall walking with shoulder abduction, and hands behind head external rotation.  She was encouraged to do these twice a day, holding 3 seconds and repeating 5 times when permitted by her physician.          PT Education - 06/26/17 1056    Education provided Yes   Education Details Lymphedema risk reduction and post op shoulder ROM HEP   Person(s) Educated Patient;Child(ren)   Methods Explanation;Demonstration;Handout   Comprehension Returned demonstration;Verbalized understanding              Breast Clinic Goals - 06/26/17 1119      Patient will be able to verbalize understanding of pertinent lymphedema risk reduction practices relevant to her diagnosis specifically related to skin care.   Time 1   Period Days   Status Achieved     Patient will be able to return demonstrate and/or verbalize understanding of the post-op home exercise program related to regaining shoulder range  of motion.   Time 1   Period Days   Status Achieved     Patient will be able to verbalize understanding of the importance of attending the postoperative After Breast Cancer Class for further lymphedema risk reduction education and therapeutic exercise.   Time 1   Period Days   Status Achieved               Plan - 06/26/17 1058  Clinical Impression Statement Patient was diagnosed on 06/17/17 with left grade 2 invasive ductal carcinoma breast cancer. It measures 3.4 cm and is located in the upper inner quadrant of her left breast. It is ER/PR positive and HER2 negative with a ki67 of 30%. She has a history of a CVA and hypertension. Her multidisciplinary medical team met prior to her assessments to determine a recommended treatment plan. She is planning to have a left mastectomy and sentinel node biopsy followed by Oncotype testing, radiation if final pathology shows mass is > 5 cm, and anti-estrogen therapy. She may benefit from post op PT to regain shoulder ROM and reduce lymphedema risk.   History and Personal Factors relevant to plan of care: History of CVA   Clinical Presentation Stable   Clinical Presentation due to: Condition is stable   Clinical Decision Making Low   Rehab Potential Excellent   Clinical Impairments Affecting Rehab Potential None   PT Frequency One time visit   PT Treatment/Interventions Patient/family education;Therapeutic exercise   PT Next Visit Plan Will f/u after surgery to determine PT needs   PT Home Exercise Plan Post op shoulder ROM HEP   Consulted and Agree with Plan of Care Patient;Family member/caregiver      Patient will benefit from skilled therapeutic intervention in order to improve the following deficits and impairments:  Postural dysfunction, Decreased knowledge of precautions, Pain, Impaired UE functional use, Decreased range of motion  Visit Diagnosis: Carcinoma of upper-inner quadrant of left breast in female, estrogen receptor  positive ([place]) - Plan: PT plan of care cert/re-cert  Abnormal posture - Plan: PT plan of care cert/re-cert      G-Codes - 52/77/82 1121    Functional Assessment Tool Used (Outpatient Only) Clinical Judgement   Functional Limitation Other PT primary   Other PT Primary Current Status (U2353) At least 1 percent but less than 20 percent impaired, limited or restricted   Other PT Primary Goal Status (I1443) At least 1 percent but less than 20 percent impaired, limited or restricted   Other PT Primary Discharge Status (X5400) At least 1 percent but less than 20 percent impaired, limited or restricted     Patient will follow up at outpatient cancer rehab if needed following surgery.  If the patient requires physical therapy at that time, a specific plan will be dictated and sent to the referring physician for approval. The patient was educated today on appropriate basic range of motion exercises to begin post operatively and the importance of attending the After Breast Cancer class following surgery.  Patient was educated today on lymphedema risk reduction practices as it pertains to recommendations that will benefit the patient immediately following surgery.  She verbalized good understanding.  No additional physical therapy is indicated at this time.     Problem List Patient Active Problem List   Diagnosis Date Noted  . Malignant neoplasm of upper-inner quadrant of left breast in female, estrogen receptor positive ([place]) 06/20/2017  . Left breast lump 06/05/2017  . CKD (chronic kidney disease), stage III 11/02/2016  . Osteopenia 04/30/2014  . Edema 09/26/2010  . PEPTIC ULCER DISEASE 03/21/2010  . ARTHRITIS 03/21/2010  . Dyslipidemia 03/20/2010  . Essential hypertension 03/20/2010  . CEREBRAL THROMBOSIS, WITH INFARCTION 02/18/2010    Annia Friendly, PT 06/26/17 11:23 AM  [place], [place],  [address] Phone: [phone]   Fax:  [phone]  Name: Shirley Wells MRN: [id number] Date of Birth:  09/08/1941  

## 2017-06-26 NOTE — Patient Instructions (Signed)

## 2017-06-26 NOTE — Progress Notes (Signed)
Radiation Oncology         [phone] ________________________________  Name: Shirley Wells        MRN: [id number]  Date of Service: 06/26/2017 DOB: [birth year]  CC:Binnie Rail, MD  Rolm Bookbinder, MD     REFERRING PHYSICIAN: Rolm Bookbinder, MD   DIAGNOSIS: The encounter diagnosis was Malignant neoplasm of upper-inner quadrant of left breast in female, estrogen receptor positive ([place]).   HISTORY OF PRESENT ILLNESS: Shirley Wells is a 76 y.o. female seen in the multidisciplinary breast conference for a new diagnosis of left  breast cancer. The patient palpabed a left breast mass and underwent diagnostic imaging including mammogram and ultrasound which revealed a 3.4 x 3.2 x 2.8 cm mass at 10:00 of the left breast. The axilla was negative for adenopathy on the left. A biopsy on 06/18/17 revealed a grade 2 invasive lobular carcinoma, ER/PR positive, HER2 negative, and a Ki 67 of 30%. She comes today to discuss options of treatment for her cancer.    PREVIOUS RADIATION THERAPY: No   PAST MEDICAL HISTORY:  Past Medical History:  Diagnosis Date  . CHICKENPOX, HX OF 03/21/2010   Qualifier: Diagnosis of  By: Marca Ancona RMA, Lucy    . 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 Dyslipidemia    intol of statin due to >3x increase LFTs  . Hypertension, essential   . Malignant neoplasm of upper-inner quadrant of left breast in female, estrogen receptor positive ([place]) 06/20/2017  . Osteoarthritis        PAST SURGICAL HISTORY: Past Surgical History:  Procedure Laterality Date  . CHOLECYSTECTOMY  1981     FAMILY HISTORY:  Family History  Problem Relation Age of Onset  . Breast cancer Mother   . Arthritis Other      SOCIAL HISTORY:  reports that she has never smoked. She has never used smokeless tobacco. She reports that she does not drink alcohol or use drugs. The patient is divorced and lives in  [place], [place].   ALLERGIES: Influenza vaccines; Pneumococcal vaccines; Simvastatin; Tetanus toxoids; and Zoster vaccine live   MEDICATIONS:  Current Outpatient Prescriptions  Medication Sig Dispense Refill  . amLODipine (NORVASC) 10 MG tablet Take 1 tablet (10 mg total) by mouth daily. 30 tablet 3  . aspirin 81 MG tablet Take 81 mg by mouth as needed.     . carvedilol (COREG) 6.25 MG tablet Take 1 tablet (6.25 mg total) by mouth 2 (two) times daily with a meal. 60 tablet 3  . clopidogrel (PLAVIX) 75 MG tablet Take 1 tablet (75 mg total) by mouth daily. 90 tablet 3  . furosemide (LASIX) 20 MG tablet Take 20 mg by mouth daily as needed.      . loratadine (CLARITIN) 10 MG tablet Take 10 mg by mouth as needed.     . Multiple Vitamin (MULTIVITAMIN) capsule Take 1 capsule by mouth daily.     No current facility-administered medications for this encounter.      REVIEW OF SYSTEMS: On review of systems, the patient reports that she is doing well overall. She denies any chest pain, shortness of breath, cough, fevers, chills, night sweats, unintended weight changes. She denies any bowel or bladder disturbances, and denies abdominal pain, nausea or vomiting. She denies any new musculoskeletal or joint aches or pains. A complete review of systems is obtained and is otherwise negative.  PHYSICAL EXAM:  Wt Readings from Last 3 Encounters:  06/05/17 155 lb (70.3 kg)  11/02/16 156 lb (70.8 kg)  08/03/15 158 lb 8 oz (71.9 kg)   Temp Readings from Last 3 Encounters:  06/05/17 97.7 F (36.5 C) (Oral)  11/02/16 97.6 F (36.4 C) (Oral)  08/03/15 97.9 F (36.6 C) (Oral)   BP Readings from Last 3 Encounters:  06/05/17 136/68  11/02/16 134/78  08/03/15 138/70   Pulse Readings from Last 3 Encounters:  06/05/17 69  11/02/16 63  08/03/15 (!) 49    In general this is a well appearing caucasian female in no acute distress. She is alert and oriented x4 and appropriate throughout the examination.  HEENT reveals that the patient is normocephalic, atraumatic. EOMs are intact. PERRLA. Skin is intact without any evidence of gross lesions.  Cardiopulmonary assessment is negative for acute distress and she exhibits normal effort. The remainder of the exam is deferred.   ECOG = 1  0 - Asymptomatic (Fully active, able to carry on all predisease activities without restriction)  1 - Symptomatic but completely ambulatory (Restricted in physically strenuous activity but ambulatory and able to carry out work of a light or sedentary nature. For example, light housework, office work)  2 - Symptomatic, <50% in bed during the day (Ambulatory and capable of all self care but unable to carry out any work activities. Up and about more than 50% of waking hours)  3 - Symptomatic, >50% in bed, but not bedbound (Capable of only limited self-care, confined to bed or chair 50% or more of waking hours)  4 - Bedbound (Completely disabled. Cannot carry on any self-care. Totally confined to bed or chair)  5 - Death   Eustace Pen MM, Creech RH, Tormey DC, et al. [phone]). "Toxicity and response criteria of the [facility]". [place] Oncol. 5 (6): 649-55    LABORATORY DATA:  Lab Results  Component Value Date   WBC 6.4 06/26/2017   HGB 12.9 06/26/2017   HCT 39.1 06/26/2017   MCV 99.7 06/26/2017   PLT 195 06/26/2017   Lab Results  Component Value Date   NA 142 11/02/2016   K 4.0 11/02/2016   CL 105 11/02/2016   CO2 30 11/02/2016   Lab Results  Component Value Date   ALT 21 11/02/2016   AST 19 11/02/2016   ALKPHOS 140 (H) 11/02/2016   BILITOT 0.6 11/02/2016      RADIOGRAPHY: US Breast Ltd Uni Left Inc Axilla  Result Date: 06/17/2017 CLINICAL DATA:  76 year old patient presents for evaluation of a firm palpable mass in the upper inner quadrant of the left breast. Per her report, she has not had a mammogram in approximately 25 years. There are no prior available mammograms. EXAM:  2D DIGITAL DIAGNOSTIC BILATERAL MAMMOGRAM WITH CAD AND ADJUNCT TOMO ULTRASOUND LEFT BREAST COMPARISON:  None available ACR Breast Density Category c: The breast tissue is heterogeneously dense, which may obscure small masses. FINDINGS: Images are of the best to the technologist stability. The patient declined the standard amount of compression due to pain. There is an irregular mass in the upper inner quadrant of the left breast, the posterior margin of which is deep to the image. This mass corresponds to the palpable area of concern, as denoted by the overlying metallic skin marker. There are scattered calcified fibroadenomas in the left breast. No suspicious masses identified in the right breast. No suspicious mass or distortion is identified in either breast. Mammographic  images were processed with CAD. On physical exam, there is a firm, fixed mass extending very superficially in the upper inner quadrant left breast, centered at 10 o'clock position 6 cm from the nipple. The mass on physical exam feels to be approximately 4 cm. The overlying skin is intact. No mass is palpated in the left axilla. Targeted ultrasound is performed, showing a hypoechoic markedly irregular angular mass at 10 o'clock position 6 cm from nipple that contains internal vascular flow. This superficial margin of the mass is very close to, and may abut the dermis. The overlying skin is normal in thickness. The deep margin of the mass is adjacent to the pectoralis muscle. The mass measures 3.4 x 3.2 x 2.8 cm on ultrasound. The surrounding breast parenchyma is negative. Ultrasound of the left axilla shows normal sized axillary lymph nodes. IMPRESSION: 3.4 palpable mass in the 10 o'clock position of the left breast is highly suspicious for malignancy. No evidence of malignancy in the right breast. The findings were discussed in detail with the patient and one of her daughters who was present for the ultrasound. RECOMMENDATION: Ultrasound-guided  core needle biopsy of the palpable mass is recommended and has been scheduled for June 18, 2017. I have discussed the findings and recommendations with the patient. Results were also provided in writing at the conclusion of the visit. If applicable, a reminder letter will be sent to the patient regarding the next appointment. BI-RADS CATEGORY  5: Highly suggestive of malignancy. Electronically Signed   By: Curlene Dolphin M.D.   On: 06/17/2017 13:02   Mm Diag Breast Tomo Bilateral  Result Date: 06/17/2017 CLINICAL DATA:  76 year old patient presents for evaluation of a firm palpable mass in the upper inner quadrant of the left breast. Per her report, she has not had a mammogram in approximately 25 years. There are no prior available mammograms. EXAM: 2D DIGITAL DIAGNOSTIC BILATERAL MAMMOGRAM WITH CAD AND ADJUNCT TOMO ULTRASOUND LEFT BREAST COMPARISON:  None available ACR Breast Density Category c: The breast tissue is heterogeneously dense, which may obscure small masses. FINDINGS: Images are of the best to the technologist stability. The patient declined the standard amount of compression due to pain. There is an irregular mass in the upper inner quadrant of the left breast, the posterior margin of which is deep to the image. This mass corresponds to the palpable area of concern, as denoted by the overlying metallic skin marker. There are scattered calcified fibroadenomas in the left breast. No suspicious masses identified in the right breast. No suspicious mass or distortion is identified in either breast. Mammographic images were processed with CAD. On physical exam, there is a firm, fixed mass extending very superficially in the upper inner quadrant left breast, centered at 10 o'clock position 6 cm from the nipple. The mass on physical exam feels to be approximately 4 cm. The overlying skin is intact. No mass is palpated in the left axilla. Targeted ultrasound is performed, showing a hypoechoic markedly  irregular angular mass at 10 o'clock position 6 cm from nipple that contains internal vascular flow. This superficial margin of the mass is very close to, and may abut the dermis. The overlying skin is normal in thickness. The deep margin of the mass is adjacent to the pectoralis muscle. The mass measures 3.4 x 3.2 x 2.8 cm on ultrasound. The surrounding breast parenchyma is negative. Ultrasound of the left axilla shows normal sized axillary lymph nodes. IMPRESSION: 3.4 palpable mass in the 10 o'clock position of  the left breast is highly suspicious for malignancy. No evidence of malignancy in the right breast. The findings were discussed in detail with the patient and one of her daughters who was present for the ultrasound. RECOMMENDATION: Ultrasound-guided core needle biopsy of the palpable mass is recommended and has been scheduled for June 18, 2017. I have discussed the findings and recommendations with the patient. Results were also provided in writing at the conclusion of the visit. If applicable, a reminder letter will be sent to the patient regarding the next appointment. BI-RADS CATEGORY  5: Highly suggestive of malignancy. Electronically Signed   By: Curlene Dolphin M.D.   On: 06/17/2017 13:02   Mm Clip Placement Left  Result Date: 06/18/2017 CLINICAL DATA:  Post ultrasound-guided biopsy of a mass in the left breast at the 10 o'clock position. EXAM: DIAGNOSTIC LEFT MAMMOGRAM POST ULTRASOUND BIOPSY COMPARISON:  Previous exam(s). FINDINGS: Mammographic images were obtained following ultrasound guided biopsy of a mass in the left breast at the 10 o'clock position. A ribbon shaped biopsy marking clip is present at the site of the biopsied mass in the upper inner left breast. IMPRESSION: Ribbon shaped biopsy marking clip appropriately positioned at site of biopsied mass in the upper inner left breast at 10 o'clock. Final Assessment: Post Procedure Mammograms for Marker Placement Electronically Signed   By:  Everlean Alstrom M.D.   On: 06/18/2017 16:59   [place] Lt Breast Bx W Loc Dev 1st Lesion Img Bx Spec US Guide  Addendum Date: 06/19/2017   ADDENDUM REPORT: 06/19/2017 15:19 ADDENDUM: Pathology revealed GRADE II INVASIVE MAMMARY CARCINOMA of the Left breast, 10:00 o'clock. This was found to be concordant by Dr. Everlean Alstrom. Pathology results were discussed with the patient by telephone. The patient reported doing well after the biopsy with tenderness at the site. Post biopsy instructions and care were reviewed and questions were answered. The patient was encouraged to call [facility] for any additional concerns. The patient was referred to [facility] on June 26, 2017. Pathology results reported by Terie Purser, RN on 06/19/2017. Electronically Signed   By: Everlean Alstrom M.D.   On: 06/19/2017 15:19   Result Date: 06/19/2017 CLINICAL DATA:  76 year old female with a highly suspicious mass in the left breast at the 10 o'clock position. EXAM: ULTRASOUND GUIDED LEFT BREAST CORE NEEDLE BIOPSY COMPARISON:  Previous exam(s). FINDINGS: I met with the patient and we discussed the procedure of ultrasound-guided biopsy, including benefits and alternatives. We discussed the high likelihood of a successful procedure. We discussed the risks of the procedure, including infection, bleeding, tissue injury, clip migration, and inadequate sampling. Informed written consent was given. The usual time-out protocol was performed immediately prior to the procedure. Lesion quadrant: Upper inner Using sterile technique and 1% Lidocaine as local anesthetic, under direct ultrasound visualization, a 14 gauge spring-loaded device was used to perform biopsy of the mass in the left breast at the 10 o'clock position using a medial to lateral approach. At the conclusion of the procedure a ribbon shaped tissue marker clip was deployed into  the biopsy cavity. Follow up 2 view mammogram was performed and dictated separately. IMPRESSION: Ultrasound guided biopsy of the mass in the left breast the 10 o'clock position. No apparent complications. Electronically Signed: By: Everlean Alstrom M.D. On: 06/18/2017 16:58       IMPRESSION/PLAN: 1. 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  Dr. Lisbeth Renshaw discusses the pathology findings and reviews the nature of invasive lobular disease. The consensus from the breast conference includes mastectomy given the size and location of her tumor as well as sentinel mapping. Oncotype testing will be performed to determine if there is a role for chemotherapy. Provided that chemotherapy is not indicated, the patient's course would then be followed by antiestrogen therapy. We will follow up with the results of her final pathology but at this point would not anticipate a course of radiotherapy if she proceed with mastectomy. Dr. Lisbeth Renshaw outlined scenarios if radiotherapy would be recommended. We discussed the risks, benefits, short, and long term effects of radiotherapy, and logistics of radiotherapy. We will see her back if there was a role following surgery. She states agreement and understanding of this plan.  The above documentation reflects my direct findings during this shared patient visit. Please see the separate note by Dr. Lisbeth Renshaw on this date for the remainder of the patient's plan of care.    Carola Rhine, PAC

## 2017-06-26 NOTE — Progress Notes (Unsigned)
Nutrition Assessment  Reason for Assessment:  Pt seen in Breast Clinic  ASSESSMENT:   76 year old female with left breast cancer.  Past medical history of HTN, stroke.    Patient reports normal appetite  Medications:  reviewed  Labs: creatinine 1.4  Anthropometrics:   Height: 64 inches Weight: 157 lb 14.4 oz BMI: 27.1   NUTRITION DIAGNOSIS: Food and nutrition related knowledge deficit related to new diagnosis of breast cancer as evidenced by no prior need for nutrition related information.  INTERVENTION:   Discussed and provided packet of information regarding nutritional tips for breast cancer patients.  Questions answered.  Teachback method used.  Contact information provided and patient knows to contact me with questions/concerns.    MONITORING, EVALUATION, and GOAL: Pt will consume a healthy plant based diet to maintain lean body mass throughout treatment.   Shirley Wells, [place], [place] Registered Dietitian [phone] (pager)

## 2017-07-01 ENCOUNTER — Telehealth: Payer: Self-pay | Admitting: *Deleted

## 2017-07-01 ENCOUNTER — Telehealth: Payer: Self-pay | Admitting: Emergency Medicine

## 2017-07-01 NOTE — Telephone Encounter (Signed)
Restart plavix to prevent stroke - will stop just before surgery

## 2017-07-01 NOTE — Telephone Encounter (Signed)
Received surgical clearance form from [facility]. Needing directions for Plavix. Spoke with pt to schedule surgical clearance appt. Pt states she has not taken the plavix since Saturday. Advised pt that she should not stop taking a medication without the drs approval. Please advise.

## 2017-07-01 NOTE — Telephone Encounter (Signed)
Spoke with patient to follow up from [facility] 06/26/17.  She states she is doing well with no complaints.  Encouraged her to call with any needs or concerns.

## 2017-07-01 NOTE — Telephone Encounter (Signed)
Spoke with pt to inform appt needs to be kept and to start back on the Plavix. Loretha Brasil at [facility] to inform.

## 2017-07-03 ENCOUNTER — Encounter: Payer: Self-pay | Admitting: Internal Medicine

## 2017-07-03 ENCOUNTER — Ambulatory Visit (INDEPENDENT_AMBULATORY_CARE_PROVIDER_SITE_OTHER): Payer: Medicare FFS | Admitting: Internal Medicine

## 2017-07-03 ENCOUNTER — Telehealth: Payer: Self-pay | Admitting: Internal Medicine

## 2017-07-03 VITALS — BP 144/68 | HR 70 | Temp 97.6°F | Resp 16 | Wt 157.0 lb

## 2017-07-03 DIAGNOSIS — I10 Essential (primary) hypertension: Secondary | ICD-10-CM | POA: Diagnosis not present

## 2017-07-03 DIAGNOSIS — Z01818 Encounter for other preprocedural examination: Secondary | ICD-10-CM | POA: Diagnosis not present

## 2017-07-03 MED ORDER — LISINOPRIL 10 MG PO TABS: 10.0000 mg | ORAL_TABLET | Freq: Every day | ORAL | 3 refills | Status: DC

## 2017-07-03 NOTE — Telephone Encounter (Signed)
Please fax note from yesterday to dr wakefield at [facility] - medical clearance.    Let patient know she can continue her aspirin per dr Donne Hazel.  She just needs to stop the plavix and vitamins/supplements.    Depending on when she has surgery I would like her to have her kidney function recheck since we changed her BP medication -- recheck bmp early next week. (ordered)

## 2017-07-03 NOTE — Assessment & Plan Note (Signed)
Her medical problems are stable.  She has no known coronary artery disease and has no symptoms suggestive of CAD.   EKG today shows sinus bradycardia at 53 bpm, low voltage, otherwise normal, no change from prior EKG from 2016  She will stop plavix 5 days prior to surgery.  She will stop all supplements/vitamins 5 days prior to surgery as well.  Per Dr.Wakefield he will continue her ASA.  plavix restarted per surgery when possible.   She is medically stable for surgery and is medically cleared.  She does not need further evaluation or testing.

## 2017-07-03 NOTE — Progress Notes (Signed)
[phone]   Subjective:    Patient ID: Shirley Wells, female    DOB: 1941/01/31, 76 y.o.   MRN: [id number]  HPI She is here for preoperative clearance. She was diagnosed with left-sided breast cancer and will be having a left total mastectomy. Surgical clearance was requested by her surgeon, Dr Donne Hazel.   She has had surgery in the past, cholecystectomy, she denies any difficulty with anesthesia. She denies family history of allergic reactions to anesthesia. She denies a family history of bleeding problems or blood clots.  She does have history of a stroke and is on plavix daily and aspirin which she takes 2-3 times a week.  She doe snot take it daily due to excessive bruising with the plavix.   She is not currently exercising regularly. She is active with yard work and house work.  She denies any chest pain, palpitations, shortness of breath or lightheadedness with her daily activities.  Last week she did stop the Plavix in anticipation of surgery, but I advised her to restart it.  Hypertension: She is taking her medication daily. She was on valsartan and we stopped that and started coreg.  She would prefer not to take that and would like a different medication.  She is compliant with a low sodium diet.  She does have some edema.  She denies chest pain, palpitations, shortness of breath and regular headaches. She is active with yard work and housework but is not exercising regularly.  She does monitor her blood pressure at home - 128/66 today and it typically runs similar.     Medications and allergies reviewed with patient and updated if appropriate.  Patient Active Problem List   Diagnosis Date Noted  . Preoperative clearance 07/03/2017  . Malignant neoplasm of upper-inner quadrant of left breast in female, estrogen receptor positive ([place]) 06/20/2017  . CKD (chronic kidney disease), stage III 11/02/2016  . Osteopenia 04/30/2014  . Edema 09/26/2010  . PEPTIC ULCER DISEASE 03/21/2010  .  ARTHRITIS 03/21/2010  . Dyslipidemia 03/20/2010  . Essential hypertension 03/20/2010  . 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 amLODipine (NORVASC) 10 MG tablet Take 1 tablet (10 mg total) by mouth daily. (Patient taking differently: Take 5 mg by mouth daily. ) 30 tablet 3  . aspirin 81 MG tablet Take 81 mg by mouth as needed.     . carvedilol (COREG) 6.25 MG tablet Take 1 tablet (6.25 mg total) by mouth 2 (two) times daily with a meal. (Patient taking differently: Take 6.25 mg by mouth daily. ) 60 tablet 3  . clopidogrel (PLAVIX) 75 MG tablet Take 1 tablet (75 mg total) by mouth daily. 90 tablet 3  . furosemide (LASIX) 20 MG tablet Take 20 mg by mouth daily as needed.      . loratadine (CLARITIN) 10 MG tablet Take 10 mg by mouth as needed.     . Multiple Vitamin (MULTIVITAMIN) capsule Take 1 capsule by mouth daily.     No current facility-administered medications on file prior to visit.     Past Medical History:  Diagnosis Date  . CHICKENPOX, HX OF 03/21/2010   Qualifier: Diagnosis of  By: Marca Ancona RMA, Lucy    . 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 Dyslipidemia    intol of statin due to >3x increase  LFTs  . Hypertension, essential   . Malignant neoplasm of upper-inner quadrant of left breast in female, estrogen receptor positive ([place]) 06/20/2017  . Osteoarthritis   . Stroke [facility])     Past Surgical History:  Procedure Laterality Date  . CHOLECYSTECTOMY  1981    Social History   Social History  . Marital status: Divorced    Spouse name: N/A  . Number of children: N/A  . Years of education: N/A   Social History Main Topics  . Smoking status: Never Smoker  . Smokeless tobacco: Never Used  . Alcohol use No  . Drug use: No  . Sexual activity: Not on file   Other Topics Concern  . Not  on file   Social History Narrative   Divorced  >40 yrs, single and lives alone.    Supportive daughters, 1 is a Therapist, sports at [employer]).    Patient enjoys gardening, dancing and caring for her grand children.    Family History  Problem Relation Age of Onset  . Breast cancer Mother 96  . Arthritis Other   . Breast cancer Maternal Aunt     Review of Systems  Constitutional: Negative for chills and fever.  HENT: Positive for rhinorrhea.   Eyes: Negative for visual disturbance.  Respiratory: Negative for cough, shortness of breath and wheezing.   Cardiovascular: Positive for leg swelling (mild). Negative for chest pain and palpitations.  Gastrointestinal: Negative for abdominal pain, blood in stool, constipation, diarrhea and nausea.       No gerd  Genitourinary: Negative for dysuria and hematuria.  Musculoskeletal: Positive for arthralgias.  Neurological: Negative for dizziness, light-headedness and headaches.       Objective:   Vitals:   07/03/17 1314  BP: (!) 144/68  Pulse: 70  Resp: 16  Temp: 97.6 F (36.4 C)   Filed Weights   07/03/17 1314  Weight: 157 lb (71.2 kg)   Body mass index is 26.95 kg/m.  Wt Readings from Last 3 Encounters:  07/03/17 157 lb (71.2 kg)  06/26/17 157 lb 14.4 oz (71.6 kg)  06/05/17 155 lb (70.3 kg)     Physical Exam Constitutional: She appears well-developed and well-nourished. No distress.  HENT:  Head: Normocephalic and atraumatic.  Right Ear: External ear normal. Normal ear canal and TM Left Ear: External ear normal.  Normal ear canal and TM Mouth/Throat: Oropharynx is clear and moist.  Eyes: Conjunctivae and EOM are normal.  Neck: Neck supple. No tracheal deviation present. No thyromegaly present.  No carotid bruit  Cardiovascular: Normal rate, regular rhythm and normal heart sounds.   No murmur heard.  No edema. Pulmonary/Chest: Effort normal and breath sounds normal. No respiratory distress. She has no wheezes. She  has no rales.  Abdominal: Soft. She exhibits no distension. There is no tenderness.  Lymphadenopathy: She has no cervical adenopathy.  Skin: Skin is warm and dry. She is not diaphoretic.  Psychiatric: She has a normal mood and affect. Her behavior is normal.         Assessment & Plan:   See Problem List for Assessment and Plan of chronic medical problems.

## 2017-07-03 NOTE — Patient Instructions (Addendum)
  An EKG was done today.   Medications reviewed and updated.  Changes include stopping the carvedilol and starting lisinopril for your blood pressure.  Monitor your BP at home and call if it is not controlled.    Your prescription(s) have been submitted to your pharmacy. Please take as directed and contact our office if you believe you are having problem(s) with the medication(s).  Stop plavix 5 days prior to your surgery.  Stop all vitamins and supplements 5 days prior to surgery.    Please followup in 6 months

## 2017-07-03 NOTE — Assessment & Plan Note (Signed)
BP controlled at home She would prefer not to be on coreg - will d/c Start lisinopril 10 mg daily, discussed possibility of cough Continue amlodipine 10 mg daily - if edema persists or worsens may need to decreased amlodipine back to 5 mg daily and increase lisinopril

## 2017-07-04 ENCOUNTER — Other Ambulatory Visit: Payer: Self-pay | Admitting: General Surgery

## 2017-07-04 NOTE — Telephone Encounter (Signed)
Clearance faxed to Dr Melissa Montane office. Spoke with pt to inform, she stated that she had not started her BP meds yet. Advised her that she needed to start new med and come in for repeat labs.

## 2017-07-19 NOTE — Pre-Procedure Instructions (Addendum)
Shirley Wells  07/19/2017      [facility] [place], [place] - [address] [place] [place] [address] Phone: [phone] Fax: [phone]  [id number] [address], [place]. [place] [address] Phone: [phone] Fax: [phone]  [facility] [address] [place] - [place] [address] [place] [address] Phone: [phone] Fax: [phone]    Your procedure is scheduled on 07/30/17.  Report to [facility] Admitting at [phone] A.M.  Call this number if you have problems the morning of surgery:  [phone]   Remember:  Do not eat food or drink liquids after midnight.  Take these medicines the morning of surgery with A SIP OF WATER    Amlodipine(norvasc),   Aspirin per dr Donne Hazel plavix per dr burns/[place]   STOP all herbel meds, nsaids (aleve,naproxen,advil,ibuprofen) 7 days prior to surgery starting 07/23/17 including all vitamins/supplements,shark cartilage   Do not wear jewelry, make-up or nail polish.  Do not wear lotions, powders, or perfumes, or deoderant.  Do not shave 48 hours prior to surgery.  Men may shave face and neck.  Do not bring valuables to the hospital.  [facility] is not responsible for any belongings or valuables.  Contacts, dentures or bridgework may not be worn into surgery.  Leave your suitcase in the car.  After surgery it may be brought to your room.  For patients admitted to the hospital, discharge time will be determined by your treatment team.  Patients discharged the day of surgery will not be allowed to drive home.   Special instructions: Special Instructions: [facility] - Preparing for Surgery  Before surgery, you can play an important role.  Because skin is not sterile, your skin needs to be as free of germs as possible.  You can reduce the number of germs on you skin by washing with CHG (chlorahexidine  gluconate) soap before surgery.  CHG is an antiseptic cleaner which kills germs and bonds with the skin to continue killing germs even after washing.  Please DO NOT use if you have an allergy to CHG or antibacterial soaps.  If your skin becomes reddened/irritated stop using the CHG and inform your nurse when you arrive at Short Stay.  Do not shave (including legs and underarms) for at least 48 hours prior to the first CHG shower.  You may shave your face.  Please follow these instructions carefully:   1.  Shower with CHG Soap the night before surgery and the morning of Surgery.  2.  If you choose to wash your hair, wash your hair first as usual with your normal shampoo.  3.  After you shampoo, rinse your hair and body thoroughly to remove the Shampoo.  4.  Use CHG as you would any other liquid soap.  You can apply chg directly  to the skin and wash gently with scrungie or a clean washcloth.  5.  Apply the CHG Soap to your body ONLY FROM THE NECK DOWN.  Do not use on open wounds or open sores.  Avoid contact with your eyes ears, mouth and genitals (private parts).  Wash genitals (private parts)       with your normal soap.  6.  Wash thoroughly, paying special attention to the area where your surgery will be performed.  7.  Thoroughly rinse your body with warm  water from the neck down.  8.  DO NOT shower/wash with your normal soap after using and rinsing off the CHG Soap.  9.  Pat yourself dry with a clean towel.            10.  Wear clean pajamas.            11.  Place clean sheets on your bed the night of your first shower and do not sleep with pets.  Day of Surgery  Do not apply any lotions/deodorants the morning of surgery.  Please wear clean clothes to the hospital/surgery center.  Please read over the  fact sheets that you were given.

## 2017-07-22 ENCOUNTER — Encounter (HOSPITAL_COMMUNITY): Payer: Self-pay

## 2017-07-22 ENCOUNTER — Encounter (HOSPITAL_COMMUNITY)
Admission: RE | Admit: 2017-07-22 | Discharge: 2017-07-22 | Disposition: A | Discharge status: Home / Self Care | Payer: Medicare FFS | Source: Ambulatory Visit | Attending: General Surgery | Admitting: General Surgery

## 2017-07-22 DIAGNOSIS — Z01812 Encounter for preprocedural laboratory examination: Secondary | ICD-10-CM | POA: Diagnosis not present

## 2017-07-22 HISTORY — DX: Gastro-esophageal reflux disease without esophagitis: K21.9

## 2017-07-22 LAB — CBC
HCT: 40.5 % (ref 36.0–46.0)
Hemoglobin: 13.2 g/dL (ref 12.0–15.0)
MCH: 32 pg (ref 26.0–34.0)
MCHC: 32.6 g/dL (ref 30.0–36.0)
MCV: 98.3 fL (ref 78.0–100.0)
Platelets: 210 10*3/uL (ref 150–400)
RBC: 4.12 MIL/uL (ref 3.87–5.11)
RDW: 13.7 % (ref 11.5–15.5)
WBC: 7.2 10*3/uL (ref 4.0–10.5)

## 2017-07-22 LAB — BASIC METABOLIC PANEL
Anion gap: 7 (ref 5–15)
BUN: 21 mg/dL — ABNORMAL HIGH (ref 6–20)
CO2: 25 mmol/L (ref 22–32)
Calcium: 9.4 mg/dL (ref 8.9–10.3)
Chloride: 108 mmol/L (ref 101–111)
Creatinine, Ser: 1.52 mg/dL — ABNORMAL HIGH (ref 0.44–1.00)
GFR calc Af Amer: 37 mL/min — ABNORMAL LOW (ref >60)
GFR calc non Af Amer: 32 mL/min — ABNORMAL LOW (ref >60)
Glucose, Bld: 83 mg/dL (ref 65–99)
Potassium: 4.3 mmol/L (ref 3.5–5.1)
Sodium: 140 mmol/L (ref 135–145)

## 2017-07-23 NOTE — Progress Notes (Signed)
Anesthesia Chart Review:  Pt is a 76 year old female scheduled for L mastectomy with sentinel lymph node biopsy on 07/30/2017 with Rolm Bookbinder, M.D.  - PCP is Billey Gosling, MD who cleared patient for surgery at last office visit 07/03/17.  PMH includes: HTN, stroke (2011), dyslipidemia, breast cancer, GERD. Never smoker. BMI 27.  Medications include: Amlodipine, ASA 81 mg, Plavix, Lasix, lisinopril. Pt to stop plavix 07/24/17 and continue ASA perioperatively.   BP (!) 151/61   Pulse 66   Temp 36.5 C   Resp 18   Ht 5\' 3"  (1.6 m)   Wt 154 lb 9.6 oz (70.1 kg)   SpO2 96%   BMI 27.39 kg/m   Preoperative labs reviewed.   - Cr 1.52, BUN 21 - PT will be obtained DOS  EKG 07/03/17: Sinus  Bradycardia (53 bpm). Low voltage -possible pulmonary disease.   Carotid duplex 08/03/15:  - Heterogeneous plaque, bilaterally. - 1-39% bilateral ICA stenosis. - Normal subclavian arteries, bilaterally. - Patent vertebral arteries with antegrade flow.  If no changes, I anticipate pt can proceed with surgery as scheduled.   Willeen Cass, FNP-BC [facility] Short Stay Surgical Center/Anesthesiology Phone: [phone] 07/23/2017 2:05 PM

## 2017-07-30 ENCOUNTER — Encounter (HOSPITAL_COMMUNITY): Payer: Self-pay

## 2017-07-30 ENCOUNTER — Observation Stay (HOSPITAL_COMMUNITY)
Admission: RE | Admit: 2017-07-30 | Discharge: 2017-07-31 | Disposition: A | Discharge status: Home / Self Care | Payer: Medicare FFS | Source: Ambulatory Visit | Attending: General Surgery | Admitting: General Surgery

## 2017-07-30 ENCOUNTER — Encounter (HOSPITAL_COMMUNITY)
Admission: RE | Disposition: A | Discharge status: Home / Self Care | Payer: Self-pay | Source: Ambulatory Visit | Attending: General Surgery

## 2017-07-30 ENCOUNTER — Ambulatory Visit (HOSPITAL_COMMUNITY): Payer: Medicare FFS | Admitting: Emergency Medicine

## 2017-07-30 ENCOUNTER — Ambulatory Visit (HOSPITAL_COMMUNITY): Payer: Medicare FFS

## 2017-07-30 ENCOUNTER — Ambulatory Visit (HOSPITAL_COMMUNITY): Payer: Medicare FFS | Admitting: Certified Registered"

## 2017-07-30 ENCOUNTER — Ambulatory Visit (HOSPITAL_COMMUNITY)
Admission: RE | Admit: 2017-07-30 | Discharge: 2017-07-30 | Disposition: A | Discharge status: Home / Self Care | Payer: Medicare FFS | Source: Ambulatory Visit | Attending: General Surgery | Admitting: General Surgery

## 2017-07-30 DIAGNOSIS — C50912 Malignant neoplasm of unspecified site of left female breast: Secondary | ICD-10-CM | POA: Diagnosis present

## 2017-07-30 DIAGNOSIS — Z8673 Personal history of transient ischemic attack (TIA), and cerebral infarction without residual deficits: Secondary | ICD-10-CM | POA: Diagnosis not present

## 2017-07-30 DIAGNOSIS — C50212 Malignant neoplasm of upper-inner quadrant of left female breast: Principal | ICD-10-CM | POA: Insufficient documentation

## 2017-07-30 DIAGNOSIS — Z170 Estrogen receptor positive status [ER+]: Secondary | ICD-10-CM

## 2017-07-30 DIAGNOSIS — Z887 Allergy status to serum and vaccine status: Secondary | ICD-10-CM | POA: Diagnosis not present

## 2017-07-30 DIAGNOSIS — Z79899 Other long term (current) drug therapy: Secondary | ICD-10-CM | POA: Diagnosis not present

## 2017-07-30 DIAGNOSIS — K219 Gastro-esophageal reflux disease without esophagitis: Secondary | ICD-10-CM | POA: Insufficient documentation

## 2017-07-30 DIAGNOSIS — Z888 Allergy status to other drugs, medicaments and biological substances status: Secondary | ICD-10-CM | POA: Insufficient documentation

## 2017-07-30 DIAGNOSIS — I10 Essential (primary) hypertension: Secondary | ICD-10-CM | POA: Insufficient documentation

## 2017-07-30 DIAGNOSIS — Z7982 Long term (current) use of aspirin: Secondary | ICD-10-CM | POA: Insufficient documentation

## 2017-07-30 DIAGNOSIS — M1990 Unspecified osteoarthritis, unspecified site: Secondary | ICD-10-CM | POA: Insufficient documentation

## 2017-07-30 HISTORY — PX: MASTECTOMY W/ SENTINEL NODE BIOPSY: SHX2001

## 2017-07-30 LAB — PROTIME-INR
INR: 0.95
Prothrombin Time: 12.5 seconds (ref 11.4–15.2)

## 2017-07-30 SURGERY — MASTECTOMY WITH SENTINEL LYMPH NODE BIOPSY
Anesthesia: Regional | Site: Breast | Laterality: Left

## 2017-07-30 MED ORDER — CEFAZOLIN SODIUM-DEXTROSE 2-4 GM/100ML-% IV SOLN
2.0000 g | INTRAVENOUS | Status: AC
  Administered 2017-07-30: 2 g via INTRAVENOUS
  Filled 2017-07-30: qty 100

## 2017-07-30 MED ORDER — EPHEDRINE 5 MG/ML INJ
INTRAVENOUS | Status: AC
  Filled 2017-07-30: qty 10

## 2017-07-30 MED ORDER — ACETAMINOPHEN 160 MG/5ML PO SOLN: 325.0000 mg | ORAL | Status: DC | PRN

## 2017-07-30 MED ORDER — SODIUM CHLORIDE 0.9 % IV SOLN
INTRAVENOUS | Status: DC
  Administered 2017-07-30: 16:00:00 via INTRAVENOUS

## 2017-07-30 MED ORDER — ACETAMINOPHEN 500 MG PO TABS
1000.0000 mg | ORAL_TABLET | Freq: Four times a day (QID) | ORAL | Status: DC
  Administered 2017-07-30 – 2017-07-31 (×2): 1000 mg via ORAL
  Filled 2017-07-30 (×2): qty 2

## 2017-07-30 MED ORDER — ONDANSETRON HCL 4 MG/2ML IJ SOLN
INTRAMUSCULAR | Status: DC | PRN
  Administered 2017-07-30: 4 mg via INTRAVENOUS

## 2017-07-30 MED ORDER — EPHEDRINE SULFATE 50 MG/ML IJ SOLN
INTRAMUSCULAR | Status: DC | PRN
  Administered 2017-07-30 (×4): 10 mg via INTRAVENOUS

## 2017-07-30 MED ORDER — FUROSEMIDE 20 MG PO TABS: 20.0000 mg | ORAL_TABLET | Freq: Every day | ORAL | Status: DC | PRN

## 2017-07-30 MED ORDER — LACTATED RINGERS IV SOLN
INTRAVENOUS | Status: DC
  Administered 2017-07-30 (×2): via INTRAVENOUS

## 2017-07-30 MED ORDER — FENTANYL CITRATE (PF) 100 MCG/2ML IJ SOLN: 25.0000 ug | INTRAMUSCULAR | Status: DC | PRN

## 2017-07-30 MED ORDER — SIMETHICONE 80 MG PO CHEW: 40.0000 mg | CHEWABLE_TABLET | Freq: Four times a day (QID) | ORAL | Status: DC | PRN

## 2017-07-30 MED ORDER — FENTANYL CITRATE (PF) 100 MCG/2ML IJ SOLN
INTRAMUSCULAR | Status: DC | PRN
  Administered 2017-07-30 (×3): 50 ug via INTRAVENOUS

## 2017-07-30 MED ORDER — METHOCARBAMOL 500 MG PO TABS: 500.0000 mg | ORAL_TABLET | Freq: Four times a day (QID) | ORAL | Status: DC | PRN

## 2017-07-30 MED ORDER — PROPOFOL 10 MG/ML IV BOLUS
INTRAVENOUS | Status: DC | PRN
  Administered 2017-07-30: 160 mg via INTRAVENOUS

## 2017-07-30 MED ORDER — GABAPENTIN 300 MG PO CAPS
300.0000 mg | ORAL_CAPSULE | ORAL | Status: AC
  Administered 2017-07-30: 300 mg via ORAL
  Filled 2017-07-30: qty 1

## 2017-07-30 MED ORDER — TECHNETIUM TC 99M SULFUR COLLOID FILTERED
1.0000 | Freq: Once | INTRAVENOUS | Status: AC | PRN
  Administered 2017-07-30: 1 via INTRADERMAL

## 2017-07-30 MED ORDER — 0.9 % SODIUM CHLORIDE (POUR BTL) OPTIME
TOPICAL | Status: DC | PRN
  Administered 2017-07-30: 2000 mL

## 2017-07-30 MED ORDER — OXYCODONE HCL 5 MG PO TABS: 5.0000 mg | ORAL_TABLET | ORAL | Status: DC | PRN

## 2017-07-30 MED ORDER — DEXAMETHASONE SODIUM PHOSPHATE 10 MG/ML IJ SOLN
INTRAMUSCULAR | Status: DC | PRN
  Administered 2017-07-30 (×2): 10 mg via INTRAVENOUS

## 2017-07-30 MED ORDER — MORPHINE SULFATE (PF) 4 MG/ML IV SOLN
2.0000 mg | INTRAVENOUS | Status: DC | PRN
Start: 2017-07-30 — End: 2017-07-31

## 2017-07-30 MED ORDER — AMLODIPINE BESYLATE 5 MG PO TABS: 5.0000 mg | ORAL_TABLET | Freq: Every day | ORAL | Status: DC

## 2017-07-30 MED ORDER — PROPOFOL 10 MG/ML IV BOLUS
INTRAVENOUS | Status: AC
  Filled 2017-07-30: qty 20

## 2017-07-30 MED ORDER — LIDOCAINE 2% (20 MG/ML) 5 ML SYRINGE
INTRAMUSCULAR | Status: AC
  Filled 2017-07-30: qty 5

## 2017-07-30 MED ORDER — ACETAMINOPHEN 325 MG PO TABS: 325.0000 mg | ORAL_TABLET | ORAL | Status: DC | PRN

## 2017-07-30 MED ORDER — ROPIVACAINE HCL 7.5 MG/ML IJ SOLN
INTRAMUSCULAR | Status: DC | PRN
  Administered 2017-07-30: 20 mL via PERINEURAL

## 2017-07-30 MED ORDER — ONDANSETRON 4 MG PO TBDP: 4.0000 mg | ORAL_TABLET | Freq: Four times a day (QID) | ORAL | Status: DC | PRN

## 2017-07-30 MED ORDER — FENTANYL CITRATE (PF) 250 MCG/5ML IJ SOLN
INTRAMUSCULAR | Status: AC
  Filled 2017-07-30: qty 5

## 2017-07-30 MED ORDER — ASPIRIN EC 81 MG PO TBEC: 81.0000 mg | DELAYED_RELEASE_TABLET | ORAL | Status: DC

## 2017-07-30 MED ORDER — OXYCODONE HCL 5 MG PO TABS: 5.0000 mg | ORAL_TABLET | Freq: Once | ORAL | Status: DC | PRN

## 2017-07-30 MED ORDER — OXYCODONE HCL 5 MG/5ML PO SOLN: 5.0000 mg | Freq: Once | ORAL | Status: DC | PRN

## 2017-07-30 MED ORDER — ACETAMINOPHEN 500 MG PO TABS
1000.0000 mg | ORAL_TABLET | ORAL | Status: AC
  Administered 2017-07-30: 1000 mg via ORAL
  Filled 2017-07-30: qty 2

## 2017-07-30 MED ORDER — PROTAMINE SULFATE 10 MG/ML IV SOLN
INTRAVENOUS | Status: AC
  Filled 2017-07-30: qty 5

## 2017-07-30 MED ORDER — ONDANSETRON HCL 4 MG/2ML IJ SOLN: 4.0000 mg | Freq: Four times a day (QID) | INTRAMUSCULAR | Status: DC | PRN

## 2017-07-30 MED ORDER — METHYLENE BLUE 0.5 % INJ SOLN
INTRAVENOUS | Status: AC
  Filled 2017-07-30: qty 10

## 2017-07-30 MED ORDER — PHENYLEPHRINE HCL 10 MG/ML IJ SOLN
INTRAMUSCULAR | Status: DC | PRN
  Administered 2017-07-30: 80 ug via INTRAVENOUS

## 2017-07-30 MED ORDER — MIDAZOLAM HCL 2 MG/2ML IJ SOLN
2.0000 mg | Freq: Once | INTRAMUSCULAR | Status: AC
  Administered 2017-07-30: 2 mg via INTRAVENOUS
  Filled 2017-07-30: qty 2

## 2017-07-30 MED ORDER — FENTANYL CITRATE (PF) 100 MCG/2ML IJ SOLN
100.0000 ug | Freq: Once | INTRAMUSCULAR | Status: AC
  Administered 2017-07-30: 100 ug via INTRAVENOUS
  Filled 2017-07-30: qty 2

## 2017-07-30 SURGICAL SUPPLY — 57 items
APPLIER CLIP 9.375 MED OPEN (MISCELLANEOUS)
BINDER BREAST LRG (GAUZE/BANDAGES/DRESSINGS) IMPLANT
BINDER BREAST XLRG (GAUZE/BANDAGES/DRESSINGS) IMPLANT
BIOPATCH RED 1 DISK 7.0 (GAUZE/BANDAGES/DRESSINGS) ×2 IMPLANT
CANISTER SUCT 3000ML PPV (MISCELLANEOUS) ×2 IMPLANT
CHLORAPREP W/TINT 26ML (MISCELLANEOUS) ×2 IMPLANT
CLIP APPLIE 9.375 MED OPEN (MISCELLANEOUS) IMPLANT
CLSR STERI-STRIP ANTIMIC 1/2X4 (GAUZE/BANDAGES/DRESSINGS) ×2 IMPLANT
CONT SPEC 4OZ CLIKSEAL STRL BL (MISCELLANEOUS) ×4 IMPLANT
COVER PROBE W GEL 5X96 (DRAPES) ×2 IMPLANT
COVER SURGICAL LIGHT HANDLE (MISCELLANEOUS) ×2 IMPLANT
DERMABOND ADVANCED (GAUZE/BANDAGES/DRESSINGS) ×1
DERMABOND ADVANCED .7 DNX12 (GAUZE/BANDAGES/DRESSINGS) ×1 IMPLANT
DRAIN CHANNEL 19F RND (DRAIN) ×2 IMPLANT
DRAPE LAPAROSCOPIC ABDOMINAL (DRAPES) ×2 IMPLANT
DRSG TEGADERM 4X4.75 (GAUZE/BANDAGES/DRESSINGS) ×2 IMPLANT
ELECT BLADE 4.0 EZ CLEAN MEGAD (MISCELLANEOUS) ×2
ELECT CAUTERY BLADE 6.4 (BLADE) ×2 IMPLANT
ELECT REM PT RETURN 9FT ADLT (ELECTROSURGICAL) ×2
ELECTRODE BLDE 4.0 EZ CLN MEGD (MISCELLANEOUS) ×1 IMPLANT
ELECTRODE REM PT RTRN 9FT ADLT (ELECTROSURGICAL) ×1 IMPLANT
EVACUATOR SILICONE 100CC (DRAIN) ×2 IMPLANT
GAUZE SPONGE 4X4 12PLY STRL (GAUZE/BANDAGES/DRESSINGS) ×2 IMPLANT
GLOVE BIO SURGEON STRL SZ 6.5 (GLOVE) ×2 IMPLANT
GLOVE BIO SURGEON STRL SZ7 (GLOVE) ×2 IMPLANT
GLOVE BIOGEL PI IND STRL 7.5 (GLOVE) ×1 IMPLANT
GLOVE BIOGEL PI INDICATOR 7.5 (GLOVE) ×1
GOWN STRL REUS W/ TWL LRG LVL3 (GOWN DISPOSABLE) ×3 IMPLANT
GOWN STRL REUS W/TWL LRG LVL3 (GOWN DISPOSABLE) ×3
KIT BASIN OR (CUSTOM PROCEDURE TRAY) ×2 IMPLANT
KIT ROOM TURNOVER OR (KITS) ×2 IMPLANT
MARKER SKIN DUAL TIP RULER LAB (MISCELLANEOUS) ×2 IMPLANT
NEEDLE 18GX1X1/2 (RX/OR ONLY) (NEEDLE) IMPLANT
NEEDLE FILTER BLUNT 18X 1/2SAF (NEEDLE)
NEEDLE FILTER BLUNT 18X1 1/2 (NEEDLE) IMPLANT
NEEDLE HYPO 25GX1X1/2 BEV (NEEDLE) IMPLANT
NS IRRIG 1000ML POUR BTL (IV SOLUTION) ×2 IMPLANT
PACK GENERAL/GYN (CUSTOM PROCEDURE TRAY) ×2 IMPLANT
PAD ARMBOARD 7.5X6 YLW CONV (MISCELLANEOUS) ×2 IMPLANT
PIN SAFETY STERILE (MISCELLANEOUS) ×2 IMPLANT
SPECIMEN JAR X LARGE (MISCELLANEOUS) ×2 IMPLANT
STAPLER VISISTAT 35W (STAPLE) IMPLANT
STRIP CLOSURE SKIN 1/2X4 (GAUZE/BANDAGES/DRESSINGS) ×2 IMPLANT
SUT ETHILON 2 0 FS 18 (SUTURE) ×4 IMPLANT
SUT ETHILON 3 0 PS 1 (SUTURE) ×2 IMPLANT
SUT MNCRL AB 4-0 PS2 18 (SUTURE) ×2 IMPLANT
SUT MON AB 4-0 PC3 18 (SUTURE) IMPLANT
SUT SILK 2 0 SH (SUTURE) ×2 IMPLANT
SUT VIC AB 2-0 CT1 27 (SUTURE) ×1
SUT VIC AB 2-0 CT1 TAPERPNT 27 (SUTURE) ×1 IMPLANT
SUT VIC AB 3-0 54X BRD REEL (SUTURE) IMPLANT
SUT VIC AB 3-0 BRD 54 (SUTURE)
SUT VIC AB 3-0 SH 18 (SUTURE) ×2 IMPLANT
SUT VIC AB 3-0 SH 8-18 (SUTURE) ×4 IMPLANT
SYR CONTROL 10ML LL (SYRINGE) IMPLANT
TOWEL OR 17X24 6PK STRL BLUE (TOWEL DISPOSABLE) IMPLANT
TOWEL OR 17X26 10 PK STRL BLUE (TOWEL DISPOSABLE) ×2 IMPLANT

## 2017-07-30 NOTE — Anesthesia Procedure Notes (Signed)
Anesthesia Regional Block: Pectoralis block   Pre-Anesthetic Checklist: ,, timeout performed, Correct Patient, Correct Site, Correct Laterality, Correct Procedure, Correct Position, site marked, Risks and benefits discussed,  Surgical consent,  Pre-op evaluation,  At surgeon's request and post-op pain management  Laterality: Left  Prep: chloraprep       Needles:  Injection technique: Single-shot  Needle Type: Echogenic Stimulator Needle          Additional Needles:   Procedures: ultrasound guided,,,,,,,,  Narrative:  Start time: 07/30/2017 11:39 AM End time: 07/30/2017 11:46 AM Injection made incrementally with aspirations every 5 mL.  Performed by: Personally  Anesthesiologist: Aviyana Sonntag  Additional Notes: H+P and labs reviewed, risks and benefits discussed with patient, procedure tolerated well without complications

## 2017-07-30 NOTE — Discharge Instructions (Signed)
[facility] [place] °[phone] ° °MASTECTOMY: POST OP INSTRUCTIONS ° °Always review your discharge instruction sheet given to you by the facility where your surgery was performed. °IF YOU HAVE DISABILITY OR FAMILY LEAVE FORMS, YOU MUST BRING THEM TO THE OFFICE FOR PROCESSING.   °DO NOT GIVE THEM TO YOUR DOCTOR. °A prescription for pain medication may be given to you upon discharge.  Take your pain medication as prescribed, if needed.  If narcotic pain medicine is not needed, then you may take acetaminophen (Tylenol), naprosyn (Alleve) or ibuprofen (Advil) as needed. °1. Take your usually prescribed medications unless otherwise directed. °2. If you need a refill on your pain medication, please contact your pharmacy.  They will contact our office to request authorization.  Prescriptions will not be filled after 5pm or on week-ends. °3. You should follow a light diet the first few days after arrival home, such as soup and crackers, etc.  Resume your normal diet the day after surgery. °4. Most patients will experience some swelling and bruising on the chest and underarm.  Ice packs will help.  Swelling and bruising can take several days to resolve. Wear the binder day and night until you return to the office.  °5. It is common to experience some constipation if taking pain medication after surgery.  Increasing fluid intake and taking a stool softener (such as Colace) will usually help or prevent this problem from occurring.  A mild laxative (Milk of Magnesia or Miralax) should be taken according to package instructions if there are no bowel movements after 48 hours. °6. Unless discharge instructions indicate otherwise, leave your bandage dry and in place until your next appointment in 3-5 days.  You may take a limited sponge bath.  No tube baths or showers until the drains are removed.  You may have steri-strips (small skin tapes) in place directly over the incision.  These strips should be left on the  skin for 7-10 days. If you have glue it will come off in next couple week.  Any sutures will be removed at an office visit °7. DRAINS:  If you have drains in place, it is important to keep a list of the amount of drainage produced each day in your drains.  Before leaving the hospital, you should be instructed on drain care.  Call our office if you have any questions about your drains. I will remove your drains when they put out less than 30 cc or ml for 2 consecutive days. °8. ACTIVITIES:  You may resume regular (light) daily activities beginning the next day--such as daily self-care, walking, climbing stairs--gradually increasing activities as tolerated.  You may have sexual intercourse when it is comfortable.  Refrain from any heavy lifting or straining until approved by your doctor. °a. You may drive when you are no longer taking prescription pain medication, you can comfortably wear a seatbelt, and you can safely maneuver your car and apply brakes. °b. RETURN TO WORK:  __________________________________________________________ °9. You should see your doctor in the office for a follow-up appointment approximately 3-5 days after your surgery.  Your doctor’s nurse will typically make your follow-up appointment when she calls you with your pathology report.  Expect your pathology report 3-4business days after surgery. °10. OTHER INSTRUCTIONS: ______________________________________________________________________________________________ ____________________________________________________________________________________________ °WHEN TO CALL YOUR DR Korban Shearer: °1. Fever over 101.0 °2. Nausea and/or vomiting °3. Extreme swelling or bruising °4. Continued bleeding from incision. °5. Increased pain, redness, or drainage from the incision. °The clinic staff is available   to answer your questions during regular business hours.  Please don’t hesitate to call and ask to speak to one of the nurses for clinical concerns.  If  you have a medical emergency, go to the nearest emergency room or call 911.  A surgeon from [facility] is always on call at the hospital. °[address], [address], [place], [place]  [address] ? [address] [address], [place], [place]   [address] °[phone] ? [phone] ? [phone] [phone] °Web site: [email or web address] ° °

## 2017-07-30 NOTE — Anesthesia Preprocedure Evaluation (Signed)
Anesthesia Evaluation  Patient identified by MRN, date of birth, ID band Patient awake    Reviewed: Allergy & Precautions, NPO status , Patient's Chart, lab work & pertinent test results  History of Anesthesia Complications Negative for: history of anesthetic complications  Airway Mallampati: III  TM Distance: <3 FB Neck ROM: Full    Dental  (+) Teeth Intact   Pulmonary    breath sounds clear to auscultation       Cardiovascular hypertension, Pt. on medications (-) angina(-) Past MI and (-) CHF (-) dysrhythmias  Rhythm:Regular     Neuro/Psych CVA, No Residual Symptoms negative psych ROS   GI/Hepatic Neg liver ROS, PUD, GERD  Controlled,  Endo/Other  negative endocrine ROS  Renal/GU Renal InsufficiencyRenal disease     Musculoskeletal  (+) Arthritis ,   Abdominal   Peds  Hematology negative hematology ROS (+)   Anesthesia Other Findings   Reproductive/Obstetrics                             Anesthesia Physical Anesthesia Plan  ASA: II  Anesthesia Plan: General and Regional   Post-op Pain Management:  Regional for Post-op pain   Induction: Intravenous  PONV Risk Score and Plan: 3 and Ondansetron, Dexamethasone and Propofol infusion  Airway Management Planned: LMA  Additional Equipment: None  Intra-op Plan:   Post-operative Plan: Extubation in OR  Informed Consent: I have reviewed the patients History and Physical, chart, labs and discussed the procedure including the risks, benefits and alternatives for the proposed anesthesia with the patient or authorized representative who has indicated his/her understanding and acceptance.   Dental advisory given  Plan Discussed with: CRNA and Surgeon  Anesthesia Plan Comments:         Anesthesia Quick Evaluation

## 2017-07-30 NOTE — Transfer of Care (Signed)
Immediate Anesthesia Transfer of Care Note  Patient: Shirley Wells  Procedure(s) Performed: Procedure(s): LEFT TOTAL MASTECTOMY WITH AXILLARY SENTINEL LYMPH NODE BIOPSY (Left)  Patient Location: PACU  Anesthesia Type:General  Level of Consciousness: awake and patient cooperative  Airway & Oxygen Therapy: Patient Spontanous Breathing and Patient connected to face mask oxygen  Post-op Assessment: Report given to RN and Post -op Vital signs reviewed and stable  Post vital signs: Reviewed and stable  Last Vitals:  Vitals:   07/30/17 0937  BP: (!) 189/71  Pulse: 88  Resp: 18  Temp: 36.4 C  SpO2: 100%    Last Pain:  Vitals:   07/30/17 0937  TempSrc: Oral      Patients Stated Pain Goal: 8 (44/92/01 0071)  Complications: No apparent anesthesia complications

## 2017-07-30 NOTE — H&P (Signed)
76 yof referred by Dr Billey Gosling for new left breast cancer. she has had for some time a left breast mass with some of what describes as an indentation. she has no pain, no nipple dc. she has not had a mm in years. she has history of drainage of left breast cyst years ago but no other history. she has fh in her mother. 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 she has no issues since then and is on plavix/asa. she underwent eval with mm and [place]. she has c density breasts. she on [place] has a 3.4x3.2x2.8 cm mass. US of the axilla is negative. 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 she is here with two of her four daughters today.   Past Surgical History  Gallbladder Surgery - Open   Diagnostic Studies History  Colonoscopy  1-5 years ago Mammogram  within last year Pap Smear  1-5 years ago  Medication History  Medications Reconciled  Social History  Caffeine use  Carbonated beverages, Coffee, Tea. No alcohol use  No drug use  Tobacco use  Never smoker.  Family History  Breast Cancer  Mother.  Pregnancy / Birth History  Age at menarche  76 years. Age of menopause  62-50 Contraceptive History  Oral contraceptives. Gravida  4 Length (months) of breastfeeding  3-6 Maternal age  81-25 Para  4 Regular periods   Other Problems  Arthritis  Cholelithiasis  Gastric Ulcer  High blood pressure  Lump In Breast   Review of Systems  General Not Present- Appetite Loss, Chills, Fatigue, Fever, Night Sweats, Weight Gain and Weight Loss. Skin Not Present- Change in Wart/Mole, Dryness, Hives, Jaundice, New Lesions, Non-Healing Wounds, Rash and Ulcer. HEENT Present- Wears glasses/contact lenses. Not Present- Earache, Hearing Loss, Hoarseness, Nose Bleed, Oral Ulcers, Ringing in the Ears, Seasonal Allergies, Sinus Pain, Sore Throat, Visual Disturbances and Yellow Eyes. Respiratory Not Present-  Bloody sputum, Chronic Cough, Difficulty Breathing, Snoring and Wheezing. Breast Not Present- Breast Mass, Breast Pain, Nipple Discharge and Skin Changes. Cardiovascular Present- Swelling of Extremities. Not Present- Chest Pain, Difficulty Breathing Lying Down, Leg Cramps, Palpitations, Rapid Heart Rate and Shortness of Breath. Gastrointestinal Not Present- Abdominal Pain, Bloating, Bloody Stool, Change in Bowel Habits, Chronic diarrhea, Constipation, Difficulty Swallowing, Excessive gas, Gets full quickly at meals, Hemorrhoids, Indigestion, Nausea, Rectal Pain and Vomiting. Female Genitourinary Not Present- Frequency, Nocturia, Painful Urination, Pelvic Pain and Urgency. Musculoskeletal Not Present- Back Pain, Joint Pain, Joint Stiffness, Muscle Pain, Muscle Weakness and Swelling of Extremities. Neurological Not Present- Decreased Memory, Fainting, Headaches, Numbness, Seizures, Tingling, Tremor, Trouble walking and Weakness. Psychiatric Not Present- Anxiety, Bipolar, Change in Sleep Pattern, Depression, Fearful and Frequent crying. Endocrine Not Present- Cold Intolerance, Excessive Hunger, Hair Changes, Heat Intolerance, Hot flashes and New Diabetes. Hematology Not Present- Blood Thinners, Easy Bruising, Excessive bleeding, Gland problems, HIV and Persistent Infections.   Physical Exam General Mental Status-Alert. Orientation-Oriented X3. Head and Neck Trachea-midline. Thyroid Gland Characteristics - normal size and consistency. Eye Sclera/Conjunctiva - Bilateral-No scleral icterus. Pupil - Bilateral-Equal. Chest and Lung Exam Chest and lung exam reveals -quiet, even and easy respiratory effort with no use of accessory muscles and on auscultation, normal breath sounds, no adventitious sounds and normal vocal resonance. Breast Nipples-No Discharge. Note: 3-4 cm upper inner quadrant left breast mass, mobile Cardiovascular Examination of related systems reveals  -well-developed, well-nourished and in no acute distress; alert and  oriented x 3. Note: 1+ bilateral le edema Cardiovascular examination reveals -normal heart sounds, regular rate and rhythm with no murmurs and normal pedal pulses bilaterally. Abdomen Note: soft nt/nd Neurologic Mental Status-Normal. Motor-Normal. Lymphatic Head & Neck General Head & Neck Lymphatics: Bilateral - Description - Normal. Axillary General Axillary Region: Bilateral - Description - Normal. 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 We discussed all of the different options for treatment for breast cancer including surgery, chemotherapy, radiation therapy, Herceptin, and antiestrogen therapy. We discussed a sentinel lymph node biopsy as she does not appear to having lymph node involvement right now. We discussed the performance of that with injection of radioactive tracer. We discussed that there is a chance of having a positive node with a sentinel lymph node biopsy and we will await the permanent pathology to make any other first further decisions in terms of her treatment. One of these options might be to return to the operating room to perform an axillary lymph node dissection. We discussed up to a 5% risk lifetime of chronic shoulder pain as well as lymphedema associated with a sentinel lymph node biopsy. We discussed the options for treatment of the breast cancer which included lumpectomy versus a mastectomy. due to location, tumor size and her breast size I do not think she is lumpectomy candidate now. we discussed possibility of downstaging with antiestrogens but she would prefer to proceed with mastectomy. she is not interested in reconstruction. We discussed the mastectomy (removal of whole breast) and the  postoperative care for that as well. Most mastectomy patients will not need radiation therapy. we discusse d overnight hospital stay and drain placement. she will get oncotype likely pending surgery. We discussed the risks of operation including bleeding, infection, possible reoperation. She understands her further therapy will be based on what her stages at the time of her operation. will touch base with Dr Quay Burow office. will need to be off plavix. I can keep on 81 mg asa.

## 2017-07-30 NOTE — Op Note (Signed)
Preoperative diagnosis: clinical stage II left breast cancer Postoperative diagnosis: same as above Procedure: 1. Left total mastectomy 2. Left deep axillary sentinel node biopsy Surgeon: Dr Serita Grammes  Anesthesia: general with pectoral block Complications none Drains 19 Fr Blake Specimens:  1. Left mastectomy short superior long lateral 2. Left axillary nodes with highest count of 321 Sponge and needle count correct at completion dispo to recovery stable.  Indications: This is a 76 year old female with a clinical stage II left breast cancer. We discussed options and decided to proceed with a left total mastectomy and left axillary area here so I told sentinel lymph node biopsy.  Procedure: After informed consent was obtained the patient was taken to the operating room. She underwent a pectoral block. She was injected with technetium in the standard fashion. She was in place under general anesthesia without complication. She was prepped and draped in the standard sterile surgical fashion. A surgical timeout was then performed.  I made a large elliptical incision encompassing the nipple areolar complex. I then carried flaps to the clavicle, parasternal area, inframammary crease, and latissimus laterally. I then removed the breast and the overlying skin and nipple/and he is to areola including the pectoralis fascia from the pectoralis muscle. This was stuck to the muscle were at the site of the tumor and I did remove some of the muscle at that location. I then marked this as above and passed off the table as a specimen. I obtained hemostasis.  I then used the neoprobe to locate several deep axillary sentinel lymph nodes. I removed these. The highest count was as above. There was no more background radioactivity and there were no more large lymph nodes. Hemostasis is observed in the axilla. I closed the axilla with 2-0 Vicryl suture. I then irrigated. Hemostasis was observed. I inserted a 26  [place] Blake drain and secured this with a 2-0 nylon suture. I then closed the skin with multiple 3-0 interrupted Vicryls. I used 4 Monocryl to close the skin. I placed one 3-0 nylon suture in mattress suture in the middle to take some tension off the flaps. I then used Dermabond and Steri-Strips. She tolerated as well as estimated transferred to recovery stable.

## 2017-07-30 NOTE — Interval H&P Note (Signed)
History and Physical Interval Note:  07/30/2017 11:32 AM  Shirley Wells  has presented today for surgery, with the diagnosis of LEFT BREAST CANCER  The various methods of treatment have been discussed with the patient and family. After consideration of risks, benefits and other options for treatment, the patient has consented to  Procedure(s): LEFT TOTAL MASTECTOMY WITH SENTINEL LYMPH NODE BIOPSY (Left) as a surgical intervention .  The patient's history has been reviewed, patient examined, no change in status, stable for surgery.  I have reviewed the patient's chart and labs.  Questions were answered to the patient's satisfaction.     Anuel Sitter

## 2017-07-30 NOTE — Anesthesia Procedure Notes (Signed)
Procedure Name: LMA Insertion Date/Time: 07/30/2017 11:39 AM Performed by: Lance Coon Pre-anesthesia Checklist: Patient identified, Emergency Drugs available, Suction available, Patient being monitored and Timeout performed Patient Re-evaluated:Patient Re-evaluated prior to induction Oxygen Delivery Method: Circle system utilized Preoxygenation: Pre-oxygenation with 100% oxygen Induction Type: IV induction LMA: LMA inserted LMA Size: 4.0 Number of attempts: 1 Placement Confirmation: positive ETCO2 and breath sounds checked- equal and bilateral Tube secured with: Tape Dental Injury: Teeth and Oropharynx as per pre-operative assessment

## 2017-07-30 NOTE — Anesthesia Postprocedure Evaluation (Signed)
Anesthesia Post Note  Patient: Shirley Wells  Procedure(s) Performed: Procedure(s) (LRB): LEFT TOTAL MASTECTOMY WITH AXILLARY SENTINEL LYMPH NODE BIOPSY (Left)     Patient location during evaluation: PACU Anesthesia Type: Regional and General Level of consciousness: awake and alert Pain management: pain level controlled Vital Signs Assessment: post-procedure vital signs reviewed and stable Respiratory status: spontaneous breathing, nonlabored ventilation, respiratory function stable and patient connected to nasal cannula oxygen Cardiovascular status: blood pressure returned to baseline and stable Postop Assessment: no signs of nausea or vomiting Anesthetic complications: no    Last Vitals:  Vitals:   07/30/17 1435 07/30/17 1450  BP: 129/66 (!) 123/54  Pulse: 83 80  Resp: 18 17  Temp:    SpO2: 95% 99%    Last Pain:  Vitals:   07/30/17 1305  TempSrc:   PainSc: Asleep                 Glee Lashomb

## 2017-07-31 ENCOUNTER — Encounter (HOSPITAL_COMMUNITY): Payer: Self-pay | Admitting: General Surgery

## 2017-07-31 ENCOUNTER — Telehealth: Payer: Self-pay | Admitting: *Deleted

## 2017-07-31 MED ORDER — OXYCODONE HCL 5 MG PO TABS: 5.0000 mg | ORAL_TABLET | ORAL | 0 refills | Status: DC | PRN

## 2017-07-31 NOTE — Progress Notes (Signed)
Shirley Wells to be D/C'd  per MD order. Discussed with the patient and all questions fully answered.  VSS, Skin clean, dry and intact without evidence of skin break down, no evidence of skin tears noted.  IV catheter discontinued intact. Site without signs and symptoms of complications. Dressing and pressure applied.  An After Visit Summary was printed and given to the patient. Patient received prescription.  D/c education completed with patient/family including follow up instructions, medication list, d/c activities limitations if indicated, with other d/c instructions as indicated by MD - patient able to verbalize understanding, all questions fully answered.   Patient instructed to return to ED, call 911, or call MD for any changes in condition.   Patient to be escorted via [place], and D/C home via private auto.

## 2017-07-31 NOTE — Telephone Encounter (Signed)
Pt was on TCM list admitted 07/30/17 for Breast cancer. Pt underwent a left total mastectomy and sn biopsy for left breast cancer. Pt was d/c 9/5, and will be follow-up w/ general surgeon  Rolm Bookbinder, MD In 1 week.   Specialty:  General Surgery

## 2017-07-31 NOTE — Discharge Summary (Signed)
Physician Discharge Summary  Patient ID: Shirley Wells MRN: [id number] DOB/AGE: 1941-10-16 76 y.o.  Admit date: 07/30/2017 Discharge date: 07/31/2017  Admission Diagnoses: Left breast cancer htn History stroke gerd  Discharge Diagnoses:  Active Problems:   Breast cancer [facility])   Discharged Condition: good  Hospital Course: 30 yof underwent left total mastectomy and sn biopsy for left breast cancer. She is doing well without complications the following morning.   Consults: None  Significant Diagnostic Studies: none  Treatments: surgery: left tm/sn biopsy  Discharge Exam: Blood pressure (!) 129/52, pulse 66, temperature 98.1 F (36.7 C), temperature source Oral, resp. rate 16, height 5\' 3"  (1.6 m), weight 69.9 kg (154 lb), SpO2 95 %. left mastectomy incision without infection, no hematoma drain as expected  Disposition:    Allergies as of 07/31/2017      Reactions   Influenza Vaccines Other (See Comments)   Patient preference   Pneumococcal Vaccines Other (See Comments)   Patient preference   Simvastatin Other (See Comments)   REACTION: inc LFT >3x baseline   Tetanus Toxoids Other (See Comments)   Patient preference   Zoster Vaccine Live Other (See Comments)   Patient preference      Medication List    TAKE these medications   amLODipine 5 MG tablet Commonly known as:  NORVASC Take 5 mg by mouth daily. What changed:  Another medication with the same name was changed. Make sure you understand how and when to take each.   amLODipine 10 MG tablet Commonly known as:  NORVASC Take 1 tablet (10 mg total) by mouth daily. What changed:  how much to take   aspirin 81 MG tablet Take 81 mg by mouth 2 (two) times a week.   b complex vitamins tablet Take 1 tablet by mouth daily.   BIOFREEZE EX Apply 1 application topically daily as needed (muscle pain).   CHLORELLA PO Take 3 tablets by mouth daily.   clopidogrel 75 MG tablet Commonly known as:  PLAVIX Take 1  tablet (75 mg total) by mouth daily.   furosemide 20 MG tablet Commonly known as:  LASIX Take 20 mg by mouth daily as needed for fluid.   lisinopril 10 MG tablet Commonly known as:  PRINIVIL,ZESTRIL Take 1 tablet (10 mg total) by mouth daily.   loratadine 10 MG tablet Commonly known as:  CLARITIN Take 10 mg by mouth daily as needed for allergies.   multivitamin capsule Take 1 capsule by mouth daily.   naproxen sodium 220 MG tablet Commonly known as:  ANAPROX Take 440 mg by mouth daily as needed (pain).   oxyCODONE 5 MG immediate release tablet Commonly known as:  Oxy IR/ROXICODONE Take 1 tablet (5 mg total) by mouth every 4 (four) hours as needed for moderate pain.   SHARK CARTILAGE PO Take 100 mg by mouth daily.   vitamin C 1000 MG tablet Take 1,000 mg by mouth daily.   Vitamin D3 1000 UNIT/SPRAY Liqd Take 2 sprays by mouth daily.            Discharge Care Instructions        Start     Ordered   07/31/17 0000  oxyCODONE (OXY IR/ROXICODONE) 5 MG immediate release tablet  Every 4 hours PRN     07/31/17 0277     Follow-up Information    Rolm Bookbinder, MD In 1 week.   Specialty:  General Surgery Contact information: [address] [place] [address] [phone]  SignedRolm Bookbinder 07/31/2017, 7:54 AM

## 2017-08-02 ENCOUNTER — Telehealth: Payer: Self-pay | Admitting: *Deleted

## 2017-08-02 NOTE — Telephone Encounter (Signed)
Ordered oncotype per Dr. Burr Medico.  Faxed requisition to pathology and confirmed receipt.

## 2017-08-04 ENCOUNTER — Encounter: Payer: Self-pay | Admitting: General Surgery

## 2017-08-04 NOTE — Progress Notes (Signed)
Pt called stating drain dressing was saturated with clear fluid. Recommended they remove the dressing carefully and replace with dry dressing and then strip JP tubing.

## 2017-08-13 ENCOUNTER — Encounter: Payer: Self-pay | Admitting: Hematology

## 2017-08-13 ENCOUNTER — Telehealth: Payer: Self-pay | Admitting: *Deleted

## 2017-08-13 NOTE — Telephone Encounter (Signed)
Received oncotype score of 23/15%. Physician team notified. Called pt with results. Scheduled and confirmed f/u appt with Dr. Burr Medico on 9/28. Denies further needs.

## 2017-08-16 ENCOUNTER — Encounter (HOSPITAL_COMMUNITY): Payer: Self-pay

## 2017-08-23 ENCOUNTER — Encounter: Payer: Self-pay | Admitting: Hematology

## 2017-08-23 ENCOUNTER — Encounter: Payer: Medicare FFS | Admitting: Hematology

## 2017-08-23 ENCOUNTER — Telehealth: Payer: Self-pay | Admitting: Hematology

## 2017-08-23 ENCOUNTER — Ambulatory Visit (HOSPITAL_BASED_OUTPATIENT_CLINIC_OR_DEPARTMENT_OTHER): Payer: Medicare FFS | Admitting: Hematology

## 2017-08-23 VITALS — BP 149/67 | HR 77 | Temp 97.7°F | Resp 18 | Ht 63.0 in | Wt 153.1 lb

## 2017-08-23 DIAGNOSIS — Z170 Estrogen receptor positive status [ER+]: Secondary | ICD-10-CM

## 2017-08-23 DIAGNOSIS — C50212 Malignant neoplasm of upper-inner quadrant of left female breast: Secondary | ICD-10-CM

## 2017-08-23 DIAGNOSIS — N183 Chronic kidney disease, stage 3 unspecified: Secondary | ICD-10-CM

## 2017-08-23 DIAGNOSIS — I6330 Cerebral infarction due to thrombosis of unspecified cerebral artery: Secondary | ICD-10-CM

## 2017-08-23 NOTE — Progress Notes (Signed)
This encounter was created in error - please disregard.

## 2017-08-23 NOTE — Telephone Encounter (Signed)
Gave avs and calendar for December  °

## 2017-08-23 NOTE — Progress Notes (Signed)
[place] [facility]  [phone]) [phone] Fax:(336) [phone]  Clinic Follow Up Note   Patient Care Team: Binnie Rail, MD as PCP - General (Internal Medicine) Garvin Fila, MD as Consulting Physician (Neurology) Rolm Bookbinder, MD as Consulting Physician (General Surgery) Truitt Merle, MD as Consulting Physician (Hematology) Kyung Rudd, MD as Consulting Physician (Radiation Oncology) 08/23/2017  CHIEF COMPLAINTS/PURPOSE OF CONSULTATION:  Follow up for Malignant neoplasm of upper-inner quadrant of left breast in female, estrogen receptor positive  Oncology History   Cancer Staging Malignant neoplasm of upper-inner quadrant of left breast in female, estrogen receptor positive ([place]) Staging form: Breast, AJCC 8th Edition - Clinical stage from 06/18/2017: Stage IB (cT2, cN0, cM0, G2, ER: Positive, PR: Positive, HER2: Negative) - Signed by Truitt Merle, MD on 06/24/2017 - Pathologic stage from 07/30/2017: Stage IA (pT2, pN0, cM0, G2, ER: Positive, PR: Positive, HER2: Negative, Oncotype DX score: 23) - Signed by Truitt Merle, MD on 08/23/2017       Malignant neoplasm of upper-inner quadrant of left breast in female, estrogen receptor positive ([place])   06/17/2017 Mammogram    [place] and MM diagnostic Breast Tomo Bilateral  IMPRESSION: 1. 3.4 palpable mass in the 10 o'clock position of the left breast is highly suspicious for malignancy. 2. No evidence of malignancy in the right breast. 3. Ultrasound of the left axilla shows normal sized axillary lymph nodes.      06/18/2017 Initial Biopsy    Diagnosis 06/18/17 Breast, left, needle core biopsy, 10:00 o'clock - INVASIVE MAMMARY CARCINOMA. At least G2  The malignant cells are negative for E-Cadherin, supporting a lobular phenotype.      06/18/2017 Initial Diagnosis    Malignant neoplasm of upper-inner quadrant of left breast in female, estrogen receptor positive ([place])      06/18/2017 Receptors her2    ER 95%+, PR 25%+, both  strong staining  HER2- Ki67 30%       07/30/2017 Surgery    LEFT TOTAL MASTECTOMY WITH AXILLARY SENTINEL LYMPH NODE BIOPSY by Dr. Donne Hazel on 07/30/17      07/30/2017 Pathology Results    Diagnosis 07/30/17 1. Breast, simple mastectomy, Left Total - INVASIVE LOBULAR CARCINOMA, GRADE 2, SPANNING 3.5 CM. - LOBULAR CARCINOMA IN SITU. - INVASIVE CARCINOMA IS BROADLY 0.1 CM FROM POSTERIOR MARGIN. - PERINEURAL INVASION PRESENT. - SEE ONCOLOGY TABLE. 2. Lymph node, sentinel, biopsy, Left Axillary - ONE OF ONE LYMPH NODES NEGATIVE FOR CARCINOMA (0/1). 3. Lymph node, sentinel, biopsy, Left - ONE OF ONE LYMPH NODES NEGATIVE FOR CARCINOMA (0/1).      07/30/2017 Oncotype testing    Oncotype 07/30/17 Recurrence score is 23, an intermediate risk With a 10-year risk of recurrence at 15% with tamoxifen alone.          HISTORY OF PRESENTING ILLNESS: 06/26/17 Shirley Wells 76 y.o. female is here because of newly diagnosed Malignant neoplasm of upper-inner quadrant of left breast in female, estrogen receptor positive.  She presents to the breast clinic with her two daughters.  She felt her mass a few weeks ago. She did not have pain with it or any nipple discharge. She did see her a dimple in her skin. She had not had mammogram in several years. She had a mammogram 06/17/14 showing the mass ad negative lymph nodes from the [place]. She had a following biopsy 06/18/17 that shows evidence of invasive mammary carcinoma.   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 She  drove to [place] but does not remember it at all and her BP was extremely high. Had to take time to regain normal speech and memory.   Today she wants to know what caused her cancer. She wants to know if her BP medication Losartan that has recently been recalled is the cause. She currently has no significant weight loss, back or hip pain. 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 She lives alone and takes care of herself. Her  daughters live near by and sees her. She is a Public librarian. She is active with exercises and does yard work. She was bad on her menopause but took natural remedies and hot flashes stopped. She does not feel comfortable with taking more medication.     GYN HISTORY  Menarchal: 13 LMP: 42-44 Contraceptive: No HRT: No hormonal replacements  GP: G4P4   CURRENT THERAPY: PENDING Anastrozole 1 mg daily   INTERVAL HISTORY:  [name] is here for a follow up. She presents to the clinic today post mastectomy accompanied by her daughter.  She saw her surgeon this morning and she has no pain and she does not plan to do breast reconstruction.  She reported her menopause went well for her with use of vitamins and herbs/supplements. She knows to stay away from estrogen supplements or replacements.  She expressed her concerns with taking more medication because of possible side effects. I answered all her questions to her satisfaction.    MEDICAL HISTORY:  Past Medical History:  Diagnosis Date  . CHICKENPOX, HX OF 03/21/2010   Qualifier: Diagnosis of  By: Marca Ancona RMA, Lucy    . 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 Dyslipidemia    intol of statin due to >3x increase LFTs  . GERD (gastroesophageal reflux disease)    hx  . Hypertension, essential   . Malignant neoplasm of upper-inner quadrant of left breast in female, estrogen receptor positive ([place]) 06/20/2017  . Osteoarthritis   . Stroke [facility])     SURGICAL HISTORY: Past Surgical History:  Procedure Laterality Date  . CHOLECYSTECTOMY  1981  . MASTECTOMY W/ SENTINEL NODE BIOPSY Left 07/30/2017   Procedure: LEFT TOTAL MASTECTOMY WITH AXILLARY SENTINEL LYMPH NODE BIOPSY;  Surgeon: Rolm Bookbinder, MD;  Location: [facility];  Service: General;  Laterality: Left;    SOCIAL HISTORY: Social History   Social History  . Marital status: Divorced     Spouse name: Shirley Wells  . Number of children: Shirley Wells  . Years of education: Shirley Wells   Occupational History  . Not on file.   Social History Main Topics  . Smoking status: Never Smoker  . Smokeless tobacco: Never Used  . Alcohol use No  . Drug use: No  . Sexual activity: Not on file   Other Topics Concern  . Not on file   Social History Narrative   Divorced  >40 yrs, single and lives alone.    Supportive daughters, 1 is a Therapist, sports at [employer]).    Patient enjoys gardening, dancing and caring for her grand children.    FAMILY HISTORY: Family History  Problem Relation Age of Onset  . Breast cancer Mother 42  . Arthritis Other   . Breast cancer Maternal Aunt     ALLERGIES:  is allergic to influenza vaccines; pneumococcal vaccines; simvastatin; tetanus toxoids; and zoster vaccine live.  MEDICATIONS:  Current Outpatient Prescriptions  Medication Sig  Dispense Refill  . amLODipine (NORVASC) 10 MG tablet Take 1 tablet (10 mg total) by mouth daily. (Patient taking differently: Take 5 mg by mouth daily. ) 30 tablet 3  . amLODipine (NORVASC) 5 MG tablet Take 5 mg by mouth daily.    . Ascorbic Acid (VITAMIN C) 1000 MG tablet Take 1,000 mg by mouth daily.    Marland Kitchen aspirin 81 MG tablet Take 81 mg by mouth 2 (two) times a week.     Marland Kitchen b complex vitamins tablet Take 1 tablet by mouth daily.    . Cholecalciferol (VITAMIN D3) 1000 UNIT/SPRAY LIQD Take 2 sprays by mouth daily.    . clopidogrel (PLAVIX) 75 MG tablet Take 1 tablet (75 mg total) by mouth daily. 90 tablet 3  . furosemide (LASIX) 20 MG tablet Take 20 mg by mouth daily as needed for fluid.     Marland Kitchen lisinopril (PRINIVIL,ZESTRIL) 10 MG tablet Take 1 tablet (10 mg total) by mouth daily. 90 tablet 3  . loratadine (CLARITIN) 10 MG tablet Take 10 mg by mouth daily as needed for allergies.     . Menthol, Topical Analgesic, (BIOFREEZE EX) Apply 1 application topically daily as needed (muscle pain).    . Multiple Vitamin (MULTIVITAMIN) capsule Take  1 capsule by mouth daily.    . Multiple Vitamins-Iron (CHLORELLA PO) Take 3 tablets by mouth daily.    . naproxen sodium (ANAPROX) 220 MG tablet Take 440 mg by mouth daily as needed (pain).    Marland Kitchen oxyCODONE (OXY IR/ROXICODONE) 5 MG immediate release tablet Take 1 tablet (5 mg total) by mouth every 4 (four) hours as needed for moderate pain. 10 tablet 0  . SHARK CARTILAGE PO Take 100 mg by mouth daily.     No current facility-administered medications for this visit.     REVIEW OF SYSTEMS:   Constitutional: Denies fevers, chills or abnormal night sweats Eyes: Denies blurriness of vision, double vision or watery eyes Ears, nose, mouth, throat, and face: Denies mucositis or sore throat Respiratory: Denies cough, dyspnea or wheezes Cardiovascular: Denies palpitation, chest discomfort or lower extremity swelling Gastrointestinal:  Denies nausea, heartburn or change in bowel habits Skin: Denies abnormal skin rashes Lymphatics: Denies new lymphadenopathy or easy bruising Neurological:Denies numbness, tingling or new weaknesses MSK: (+) Arthritis in hands Behavioral/Psych: Mood is stable, no new changes  Breast: (+) s/p left total mastectomy  All other systems were reviewed with the patient and are negative.   PHYSICAL EXAMINATION: ECOG PERFORMANCE STATUS: 0 - Asymptomatic  Vitals:   08/23/17 1404  BP: (!) 149/67  Pulse: 77  Resp: 18  Temp: 97.7 F (36.5 C)  SpO2: 99%   Filed Weights   08/23/17 1404  Weight: 153 lb 1.6 oz (69.4 kg)    GENERAL:alert, no distress and comfortable SKIN: skin color, texture, turgor are normal, no rashes or significant lesions EYES: normal, conjunctiva are pink and non-injected, sclera clear OROPHARYNX:no exudate, no erythema and lips, buccal mucosa, and tongue normal  NECK: supple, thyroid normal size, non-tender, without nodularity LYMPH:  no palpable lymphadenopathy in the cervical, axillary or inguinal LUNGS: clear to auscultation and percussion  with normal breathing effort HEART: regular rate & rhythm and no murmurs (+) swelling in feet L>R ABDOMEN:abdomen soft, non-tender and normal bowel sounds Musculoskeletal:no cyanosis of digits and no clubbing  PSYCH: alert & oriented x 3 with fluent speech NEURO: no focal motor/sensory deficits BREAST:  (+) S/p left total mastectomy, incision in mid- left chest healing well, no discharge  and no skin erythema.    LABORATORY DATA:  I have reviewed the data as listed CBC Latest Ref Rng & Units 07/22/2017 06/26/2017 11/02/2016  WBC 4.0 - 10.5 K/uL 7.2 6.4 8.5  Hemoglobin 12.0 - 15.0 g/dL 13.2 12.9 14.5  Hematocrit 36.0 - 46.0 % 40.5 39.1 41.9  Platelets 150 - 400 K/uL 210 195 220.0    CMP Latest Ref Rng & Units 07/22/2017 06/26/2017 11/02/2016  Glucose 65 - 99 mg/dL 83 81 100(H)  BUN 6 - 20 mg/dL 21(H) 23.6 25(H)  Creatinine 0.44 - 1.00 mg/dL 1.52(H) 1.4(H) 1.55(H)  Sodium 135 - 145 mmol/L 140 142 142  Potassium 3.5 - 5.1 mmol/L 4.3 4.3 4.0  Chloride 101 - 111 mmol/L 108 - 105  CO2 22 - 32 mmol/L 25 25 30   Calcium 8.9 - 10.3 mg/dL 9.4 9.5 9.7  Total Protein 6.4 - 8.3 g/dL - 7.2 7.6  Total Bilirubin 0.20 - 1.20 mg/dL - 0.41 0.6  Alkaline Phos 40 - 150 U/L - 144 140(H)  AST 5 - 34 U/L - 27 19  ALT 0 - 55 U/L - 31 21   PATHOLOGY  Diagnosis 07/30/17 1. Breast, simple mastectomy, Left Total - INVASIVE LOBULAR CARCINOMA, GRADE 2, SPANNING 3.5 CM. - LOBULAR CARCINOMA IN SITU. - INVASIVE CARCINOMA IS BROADLY 0.1 CM FROM POSTERIOR MARGIN. - PERINEURAL INVASION PRESENT. - SEE ONCOLOGY TABLE. 2. Lymph node, sentinel, biopsy, Left Axillary - ONE OF ONE LYMPH NODES NEGATIVE FOR CARCINOMA (0/1). 3. Lymph node, sentinel, biopsy, Left - ONE OF ONE LYMPH NODES NEGATIVE FOR CARCINOMA (0/1). Microscopic Comment 1. BREAST, INVASIVE TUMOR Procedure: Left simple mastectomy with left axillary lymph node biopsies. Laterality: Left. Tumor Size: 3.5 cm. Histologic Type: Invasive lobular carcinoma. Grade:  2 Tubular Differentiation: 2 Nuclear Pleomorphism: 3 Mitotic Count: 2 Ductal Carcinoma in Situ (DCIS): Not identified. Extent of Tumor: Confined to breast parenchyma. Margins: Invasive carcinoma, distance from closest margin: 0.1 cm to posterior broadly. DCIS, distance from closest margin: Shirley Wells. Regional Lymph Nodes: Number of Lymph Nodes Examined: 2 Number of Sentinel Lymph Nodes Examined: 2 Lymph Nodes with Macrometastases: 0 Lymph Nodes with Micrometastases: 0 Lymph Nodes with Isolated Tumor Cells: 0 1 of 3 FINAL for Kirley, Kalis M ([id number]) Microscopic Comment(continued) Breast Prognostic Profile: Performed on biopsy [id number], see below. Will not be repeated. Estrogen Receptor: Positive, 95% strong staining. Progesterone Receptor: Positive, 25% strong staining. Her2: Negative (ratio 1.72). Ki-67: 30% Best tumor block for sendout testing: 1B Pathologic Stage Classification (pTNM, AJCC 8th Edition): Primary Tumor (pT): pT2 Regional Lymph Nodes (pN): pN0 Distant Metastases (pM): pMX Comments: There are focal areas with tubular formation, however, e-cadherin is negative in the entire tumor. Pancytokeratin is negative on the lymph nodes.    Diagnosis 06/18/17 Breast, left, needle core biopsy, 10:00 o'clock - INVASIVE MAMMARY CARCINOMA. - SEE COMMENT. Microscopic Comment The carcinoma appears at least grade 2. An E-Cadherin stain and a breast prognostic profile will be performed and the results reported separately. The results were called to [facility] on 06/19/17. (JBK:gt, 06/19/17) Results: HER2 - NEGATIVE RATIO OF HER2/CEP17 SIGNALS 1.72 AVERAGE HER2 COPY NUMBER PER CELL 3.88 Results: IMMUNOHISTOCHEMICAL AND MORPHOMETRIC ANALYSIS PERFORMED MANUALLY Estrogen Receptor: 95%, POSITIVE, STRONG STAINING INTENSITY Progesterone Receptor: 25%, POSITIVE, STRONG STAINING INTENSITY Proliferation Marker Ki67: 30%   ONCOTYPE 07/30/17    RADIOGRAPHIC  STUDIES: I have personally reviewed the radiological images as listed and agreed with the findings in the report. No results found.  ASSESSMENT & PLAN:  Shirley Wells is a 76 y.o. caucasian female with a history of osteoarthritis, HTN, Dyslipidemia and CVA, presented with a palpable left breast mass.  1. Malignant neoplasm of upper-inner quadrant of left breast in female, invasive lobular carcinoma, cT2N0M0, Stage 1b, ER/PR positive, HER2 negative, Grade 2 ---I discussed her surgical path result in details -the Oncotype Dx result was reviewed with her in details. She has intermedia risk based on the recurrence score, which predicts 10 year distant recurrence after 5 years of tamoxifen 15%. The benefit of chemotherapy in the intermedia risk group is small and controversial. Given her relatively low score in the intermedia group and advanced age, I did not recommend adjuvant chemotherapy. She agrees with the plan -Giving the strong ER and PR expression in her tumor and her postmenopausal status, I recommend adjuvant endocrine therapy with aromatase inhibitor or tamoxifen for 5-10 years. Giving lobular histology, I'll recommend 7-10 years aromatase inhibitor if she tolerates well. Potential benefits and side effects were discussed with patient and she is interested. --The potential benefit and side effects form AI, which includes but not limited to, hot flash, skin and vaginal dryness, metabolic changes ( increased blood glucose, cholesterol, weight, etc.), slightly in increased risk of cardiovascular disease, cataracts, muscular and joint discomfort, osteopenia and osteoporosis, etc, were discussed with her in great details.  ---The potential side effects from Tamoxifen, which includes but not limited to, hot flash, skin and vaginal dryness, slightly increased risk of cardiovascular disease and cataract, small risk of thrombosis and endometrial cancer, were discussed with her in great details. Preventive  strategies for thrombosis, such as being physically active, using compression stocks, avoid cigarette smoking, etc., were reviewed with her. I also recommend her to follow-up with her gynecologist once a year, and watch for vaginal spotting or bleeding, as a clinically sign of endometrial cancer, etc. She voiced good understanding -Her last bone density scan 2 years ago showed osteopenia, I recommend her to have a repeated bone density scan in the next month, then the tumor if she will take AI or tamoxifen based on the scan result -She does not need postmastectomy radiation -F/u in 2-3 months   2. History of Stroke -prior major blockage in basal artery, patient declined surgical intervention. -She is on baby aspirin and Plavix     3. Genetics -Due to her strong familial history of breast cancer I recommend she gets tested and her daughters to know -Her daughters would like her to get tested, pt agrees -Will refer to genetics  4. HTN -Continue medication. She will follow-up with her primary care physician  5. Bone Health, Osteoporosis  -I discussed that anastrozole can weaken her bones.  -She had bone density in 2016 which showed she has osteoporosis with a risk fractured hip of 3.5%.  -I suggest she take calcium and vitamin D again -Will repeat bone density scan before starting anastrozole.   PLAN:  -Will copy note to Dr. Quay Burow to order bone density scan at her office  -pt will call me after her DEXA scan, and we will decided if she will start anastrozole or Tamoxifen  -lab and f/u in 2-3 months     No orders of the defined types were placed in this encounter.   All questions were answered. The patient knows to call the clinic with any problems, questions or concerns. I spent 25 minutes counseling the patient face to face. The total time spent in the appointment was 30 minutes and more than 50%  was on counseling.  This document serves as a record of services personally performed  by Truitt Merle, MD. It was created on her behalf by Joslyn Devon, a trained medical scribe. The creation of this record is based on the scribe's personal observations and the provider's statements to them. This document has been checked and approved by the attending provider.     Truitt Merle, MD 08/23/2017

## 2017-08-23 NOTE — Telephone Encounter (Signed)
Patient missed earlier appointment called back Dr. Burr Medico approved today at 2pm

## 2017-08-24 ENCOUNTER — Other Ambulatory Visit: Payer: Self-pay | Admitting: Internal Medicine

## 2017-08-24 DIAGNOSIS — Z13820 Encounter for screening for osteoporosis: Secondary | ICD-10-CM

## 2017-08-24 DIAGNOSIS — M85851 Other specified disorders of bone density and structure, right thigh: Secondary | ICD-10-CM

## 2017-09-13 ENCOUNTER — Ambulatory Visit (INDEPENDENT_AMBULATORY_CARE_PROVIDER_SITE_OTHER)
Admission: RE | Admit: 2017-09-13 | Discharge: 2017-09-13 | Disposition: A | Discharge status: Home / Self Care | Payer: Medicare FFS | Source: Ambulatory Visit | Attending: Internal Medicine | Admitting: Internal Medicine

## 2017-09-13 DIAGNOSIS — M85851 Other specified disorders of bone density and structure, right thigh: Secondary | ICD-10-CM

## 2017-09-13 DIAGNOSIS — Z13820 Encounter for screening for osteoporosis: Secondary | ICD-10-CM

## 2017-09-19 ENCOUNTER — Other Ambulatory Visit: Payer: Self-pay | Admitting: Hematology

## 2017-09-19 ENCOUNTER — Telehealth: Payer: Self-pay | Admitting: Hematology

## 2017-09-19 MED ORDER — TAMOXIFEN CITRATE 20 MG PO TABS: 20.0000 mg | ORAL_TABLET | Freq: Every day | ORAL | 2 refills | Status: DC

## 2017-09-19 NOTE — Telephone Encounter (Signed)
I called patient and discussed her bone density scan results, which showed osteopenia with high risk of hip fracture 93.7% in 10 years). I again reviewed with the benefits and potential side effects from tamoxifen and anastrozole, I recommend her to take tamoxifen. She agrees. I called into her pharmacy.  Shirley Wells  09/19/2017

## 2017-09-25 ENCOUNTER — Encounter: Payer: Self-pay | Admitting: *Deleted

## 2017-11-08 ENCOUNTER — Encounter: Payer: Medicare FFS | Admitting: Internal Medicine

## 2017-11-21 ENCOUNTER — Telehealth: Payer: Self-pay | Admitting: *Deleted

## 2017-11-21 NOTE — Telephone Encounter (Signed)
FYI "Cancel my appointments for 11-25-2017.  I'm leaving today for [place].  My sister is very sick.  I do not know how this will turn out or when I'll return.  I'm tolerating Tamoxifen just fine without any problems.  Don't believe I need a refill but will call my drugist if I do.  Appointments cancelled as requested by patient.Marland Kitchen

## 2017-11-25 ENCOUNTER — Other Ambulatory Visit: Payer: Medicare FFS

## 2017-11-25 ENCOUNTER — Ambulatory Visit: Payer: Medicare FFS | Admitting: Hematology

## 2018-02-09 NOTE — Patient Instructions (Addendum)

## 2018-02-09 NOTE — Progress Notes (Signed)
Subjective:    Patient ID: Shirley Wells, female    DOB: 1941-07-13, 77 y.o.   MRN: [id number]  HPI The patient is here for follow up.  Hypertension: She is taking her medication daily. She is compliant with a low sodium diet.  She denies chest pain, palpitations, daily edema, shortness of breath and regular headaches. She is exercising regularly.  She does monitor her blood pressure at home - it is well controlled.    Ckd, stage 3: She rarely take aleve -  Maybe twice a month.  She drinks plenty of water.  Leg edema:  She She is taking lasix prn -- she takes it a couple of times a year.    History of stroke:  She is taking the plavix daily.  She is intolerant to statins. She does not want to try any other cholesterol medication.  Breast cancer:  She is following with oncology.  She is not taking tamoxifen daily. She took it for one month and denies side effects.  She is concerned about possible side effects.    Medications and allergies reviewed with patient and updated if appropriate.  Patient Active Problem List   Diagnosis Date Noted  . Malignant neoplasm of upper-inner quadrant of left breast in female, estrogen receptor positive ([place]) 06/20/2017  . CKD (chronic kidney disease), stage III ([place]) 11/02/2016  . Osteopenia 04/30/2014  . Edema 09/26/2010  . PEPTIC ULCER DISEASE 03/21/2010  . ARTHRITIS 03/21/2010  . Dyslipidemia 03/20/2010  . Essential hypertension 03/20/2010  . History of CVA (cerebrovascular accident) 02/18/2010    Current Outpatient Medications on File Prior to Visit  Medication Sig Dispense Refill  . amLODipine (NORVASC) 5 MG tablet Take 5 mg by mouth daily.    . Ascorbic Acid (VITAMIN C) 1000 MG tablet Take 1,000 mg by mouth daily.    Marland Kitchen aspirin 81 MG tablet Take 81 mg by mouth 2 (two) times a week.     Marland Kitchen b complex vitamins tablet Take 1 tablet by mouth daily.    . Cholecalciferol (VITAMIN D3) 1000 UNIT/SPRAY LIQD Take 2 sprays by mouth daily.    .  clopidogrel (PLAVIX) 75 MG tablet Take 1 tablet (75 mg total) by mouth daily. 90 tablet 3  . furosemide (LASIX) 20 MG tablet Take 20 mg by mouth daily as needed for fluid.     Marland Kitchen lisinopril (PRINIVIL,ZESTRIL) 10 MG tablet Take 1 tablet (10 mg total) by mouth daily. 90 tablet 3  . loratadine (CLARITIN) 10 MG tablet Take 10 mg by mouth daily as needed for allergies.     . Menthol, Topical Analgesic, (BIOFREEZE EX) Apply 1 application topically daily as needed (muscle pain).    . Multiple Vitamin (MULTIVITAMIN) capsule Take 1 capsule by mouth daily.    Marland Kitchen SHARK CARTILAGE PO Take 100 mg by mouth daily.    . tamoxifen (NOLVADEX) 20 MG tablet Take 1 tablet (20 mg total) by mouth daily. 30 tablet 2   No current facility-administered medications on file prior to visit.     Past Medical History:  Diagnosis Date  . CHICKENPOX, HX OF 03/21/2010   Qualifier: Diagnosis of  By: Marca Ancona RMA, Lucy    . 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 Dyslipidemia    intol of statin due to >3x increase LFTs  . GERD (gastroesophageal reflux disease)    hx  . Hypertension, essential   .  Malignant neoplasm of upper-inner quadrant of left breast in female, estrogen receptor positive ([place]) 06/20/2017  . Osteoarthritis   . Stroke [facility])     Past Surgical History:  Procedure Laterality Date  . CHOLECYSTECTOMY  1981  . MASTECTOMY W/ SENTINEL NODE BIOPSY Left 07/30/2017   Procedure: LEFT TOTAL MASTECTOMY WITH AXILLARY SENTINEL LYMPH NODE BIOPSY;  Surgeon: Rolm Bookbinder, MD;  Location: [facility];  Service: General;  Laterality: Left;    Social History   Socioeconomic History  . Marital status: Divorced    Spouse name: Not on file  . Number of children: Not on file  . Years of education: Not on file  . Highest education level: Not on file  Social Needs  . Financial resource strain: Not on file  . Food insecurity - worry: Not on file   . Food insecurity - inability: Not on file  . Transportation needs - medical: Not on file  . Transportation needs - non-medical: Not on file  Occupational History  . Not on file  Tobacco Use  . Smoking status: Never Smoker  . Smokeless tobacco: Never Used  Substance and Sexual Activity  . Alcohol use: No    Alcohol/week: 0.0 oz  . Drug use: No  . Sexual activity: Not on file  Other Topics Concern  . Not on file  Social History Narrative   Divorced  >40 yrs, single and lives alone.    Supportive daughters, 1 is a Therapist, sports at [employer] [place]).    Patient enjoys gardening, dancing and caring for her grand children.    Family History  Problem Relation Age of Onset  . Breast cancer Mother 30  . Arthritis Other   . Breast cancer Maternal Aunt     Review of Systems  Constitutional: Negative for chills and fever.  Respiratory: Negative for cough, shortness of breath and wheezing.   Cardiovascular: Positive for leg swelling (ankles with travel). Negative for chest pain and palpitations.  Neurological: Negative for dizziness, light-headedness and headaches.       Objective:   Vitals:   02/10/18 1352  BP: 132/72  Pulse: 64  Resp: 16  Temp: 97.9 F (36.6 C)  SpO2: 96%   BP Readings from Last 3 Encounters:  02/10/18 132/72  08/23/17 (!) 149/67  07/31/17 (!) 129/52   Wt Readings from Last 3 Encounters:  02/10/18 156 lb (70.8 kg)  08/23/17 153 lb 1.6 oz (69.4 kg)  07/30/17 154 lb (69.9 kg)   Body mass index is 27.63 kg/m.   Physical Exam    Constitutional: Appears well-developed and well-nourished. No distress.  HENT:  Head: Normocephalic and atraumatic.  Neck: Neck supple. No tracheal deviation present. No thyromegaly present.  No cervical lymphadenopathy Cardiovascular: Normal rate, regular rhythm and normal heart sounds.   No murmur heard. No carotid bruit .  No edema Pulmonary/Chest: Effort normal and breath sounds normal. No respiratory distress. No has  no wheezes. No rales.  Skin: Skin is warm and dry. Not diaphoretic.  Psychiatric: Normal mood and affect. Behavior is normal.      Assessment & Plan:    See Problem List for Assessment and Plan of chronic medical problems.

## 2018-02-10 ENCOUNTER — Encounter: Payer: Self-pay | Admitting: Internal Medicine

## 2018-02-10 ENCOUNTER — Ambulatory Visit: Payer: Medicare FFS | Admitting: Internal Medicine

## 2018-02-10 ENCOUNTER — Other Ambulatory Visit (INDEPENDENT_AMBULATORY_CARE_PROVIDER_SITE_OTHER): Payer: Medicare FFS

## 2018-02-10 VITALS — BP 132/72 | HR 64 | Temp 97.9°F | Resp 16 | Wt 156.0 lb

## 2018-02-10 DIAGNOSIS — N183 Chronic kidney disease, stage 3 unspecified: Secondary | ICD-10-CM

## 2018-02-10 DIAGNOSIS — Z8673 Personal history of transient ischemic attack (TIA), and cerebral infarction without residual deficits: Secondary | ICD-10-CM | POA: Diagnosis not present

## 2018-02-10 DIAGNOSIS — R600 Localized edema: Secondary | ICD-10-CM

## 2018-02-10 DIAGNOSIS — I10 Essential (primary) hypertension: Secondary | ICD-10-CM | POA: Diagnosis not present

## 2018-02-10 DIAGNOSIS — E785 Hyperlipidemia, unspecified: Secondary | ICD-10-CM

## 2018-02-10 LAB — CBC WITH DIFFERENTIAL/PLATELET
Basophils Absolute: 0 10*3/uL (ref 0.0–0.1)
Basophils Relative: 0.3 % (ref 0.0–3.0)
Eosinophils Absolute: 0.3 10*3/uL (ref 0.0–0.7)
Eosinophils Relative: 4.1 % (ref 0.0–5.0)
HCT: 38.4 % (ref 36.0–46.0)
Hemoglobin: 13.3 g/dL (ref 12.0–15.0)
Lymphocytes Relative: 38.3 % (ref 12.0–46.0)
Lymphs Abs: 3.1 10*3/uL (ref 0.7–4.0)
MCHC: 34.7 g/dL (ref 30.0–36.0)
MCV: 99.1 fl (ref 78.0–100.0)
Monocytes Absolute: 0.5 10*3/uL (ref 0.1–1.0)
Monocytes Relative: 6 % (ref 3.0–12.0)
Neutro Abs: 4.1 10*3/uL (ref 1.4–7.7)
Neutrophils Relative %: 51.3 % (ref 43.0–77.0)
Platelets: 192 10*3/uL (ref 150.0–400.0)
RBC: 3.87 Mil/uL (ref 3.87–5.11)
RDW: 13.5 % (ref 11.5–15.5)
WBC: 8 10*3/uL (ref 4.0–10.5)

## 2018-02-10 LAB — COMPREHENSIVE METABOLIC PANEL
ALT: 28 U/L (ref 0–35)
AST: 24 U/L (ref 0–37)
Albumin: 3.9 g/dL (ref 3.5–5.2)
Alkaline Phosphatase: 141 U/L — ABNORMAL HIGH (ref 39–117)
BUN: 20 mg/dL (ref 6–23)
CO2: 25 mEq/L (ref 19–32)
Calcium: 9.6 mg/dL (ref 8.4–10.5)
Chloride: 108 mEq/L (ref 96–112)
Creatinine, Ser: 1.6 mg/dL — ABNORMAL HIGH (ref 0.40–1.20)
GFR: 33.17 mL/min — ABNORMAL LOW (ref >60.00)
Glucose, Bld: 92 mg/dL (ref 70–99)
Potassium: 4.6 mEq/L (ref 3.5–5.1)
Sodium: 143 mEq/L (ref 135–145)
Total Bilirubin: 0.5 mg/dL (ref 0.2–1.2)
Total Protein: 7.6 g/dL (ref 6.0–8.3)

## 2018-02-10 MED ORDER — LISINOPRIL 10 MG PO TABS: 10.0000 mg | ORAL_TABLET | Freq: Every day | ORAL | 3 refills | Status: DC

## 2018-02-10 MED ORDER — CLOPIDOGREL BISULFATE 75 MG PO TABS: 75.0000 mg | ORAL_TABLET | Freq: Every day | ORAL | 3 refills | Status: DC

## 2018-02-10 MED ORDER — AMLODIPINE BESYLATE 5 MG PO TABS: 5.0000 mg | ORAL_TABLET | Freq: Every day | ORAL | 3 refills | Status: DC

## 2018-02-10 NOTE — Assessment & Plan Note (Signed)
cmp today Stable Avoid nsaids Continue increased water intake

## 2018-02-10 NOTE — Assessment & Plan Note (Signed)
Continue plavix Statin intolerant Will hold off on checking lipids - she refuses to try any other cholesterol medication

## 2018-02-10 NOTE — Assessment & Plan Note (Signed)
Rare edema with travel only Rarely takes lasix

## 2018-02-10 NOTE — Assessment & Plan Note (Signed)
Will not check lipid panel - refuses to try any other cholesterol medication

## 2018-02-10 NOTE — Assessment & Plan Note (Signed)
BP well controlled Current regimen effective and well tolerated Continue current medications at current doses cmp  

## 2018-08-03 NOTE — Patient Instructions (Addendum)
Test(s) ordered today. Your results will be released to [place] (or called to you) after review, usually within 72hours after test completion. If any changes need to be made, you will be notified at that same time.  Medications reviewed and updated.   No changes recommended at this time.   Please followup in 6 months     Back Exercises The following exercises strengthen the muscles that help to support the back. They also help to keep the lower back flexible. Doing these exercises can help to prevent back pain or lessen existing pain. If you have back pain or discomfort, try doing these exercises 2-3 times each day or as told by your health care provider. When the pain goes away, do them once each day, but increase the number of times that you repeat the steps for each exercise (do more repetitions). If you do not have back pain or discomfort, do these exercises once each day or as told by your health care provider. Exercises Single Knee to Chest  Repeat these steps 3-5 times for each leg: 1. Lie on your back on a firm bed or the floor with your legs extended. 2. Bring one knee to your chest. Your other leg should stay extended and in contact with the floor. 3. Hold your knee in place by grabbing your knee or thigh. 4. Pull on your knee until you feel a gentle stretch in your lower back. 5. Hold the stretch for 10-30 seconds. 6. Slowly release and straighten your leg.  Pelvic Tilt  Repeat these steps 5-10 times: 1. Lie on your back on a firm bed or the floor with your legs extended. 2. Bend your knees so they are pointing toward the ceiling and your feet are flat on the floor. 3. Tighten your lower abdominal muscles to press your lower back against the floor. This motion will tilt your pelvis so your tailbone points up toward the ceiling instead of pointing to your feet or the floor. 4. With gentle tension and even breathing, hold this position for 5-10 seconds.  Cat-Cow  Repeat  these steps until your lower back becomes more flexible: 1. Get into a hands-and-knees position on a firm surface. Keep your hands under your shoulders, and keep your knees under your hips. You may place padding under your knees for comfort. 2. Let your head hang down, and point your tailbone toward the floor so your lower back becomes rounded like the back of a cat. 3. Hold this position for 5 seconds. 4. Slowly lift your head and point your tailbone up toward the ceiling so your back forms a sagging arch like the back of a cow. 5. Hold this position for 5 seconds.  Press-Ups  Repeat these steps 5-10 times: 1. Lie on your abdomen (face-down) on the floor. 2. Place your palms near your head, about shoulder-width apart. 3. While you keep your back as relaxed as possible and keep your hips on the floor, slowly straighten your arms to raise the top half of your body and lift your shoulders. Do not use your back muscles to raise your upper torso. You may adjust the placement of your hands to make yourself more comfortable. 4. Hold this position for 5 seconds while you keep your back relaxed. 5. Slowly return to lying flat on the floor.  Bridges  Repeat these steps 10 times: 1. Lie on your back on a firm surface. 2. Bend your knees so they are pointing toward the ceiling and  your feet are flat on the floor. 3. Tighten your buttocks muscles and lift your buttocks off of the floor until your waist is at almost the same height as your knees. You should feel the muscles working in your buttocks and the back of your thighs. If you do not feel these muscles, slide your feet 1-2 inches farther away from your buttocks. 4. Hold this position for 3-5 seconds. 5. Slowly lower your hips to the starting position, and allow your buttocks muscles to relax completely.  If this exercise is too easy, try doing it with your arms crossed over your chest. Abdominal Crunches  Repeat these steps 5-10 times: 1. Lie  on your back on a firm bed or the floor with your legs extended. 2. Bend your knees so they are pointing toward the ceiling and your feet are flat on the floor. 3. Cross your arms over your chest. 4. Tip your chin slightly toward your chest without bending your neck. 5. Tighten your abdominal muscles and slowly raise your trunk (torso) high enough to lift your shoulder blades a tiny bit off of the floor. Avoid raising your torso higher than that, because it can put too much stress on your low back and it does not help to strengthen your abdominal muscles. 6. Slowly return to your starting position.  Back Lifts Repeat these steps 5-10 times: 1. Lie on your abdomen (face-down) with your arms at your sides, and rest your forehead on the floor. 2. Tighten the muscles in your legs and your buttocks. 3. Slowly lift your chest off of the floor while you keep your hips pressed to the floor. Keep the back of your head in line with the curve in your back. Your eyes should be looking at the floor. 4. Hold this position for 3-5 seconds. 5. Slowly return to your starting position.  Contact a health care provider if:  Your back pain or discomfort gets much worse when you do an exercise.  Your back pain or discomfort does not lessen within 2 hours after you exercise. If you have any of these problems, stop doing these exercises right away. Do not do them again unless your health care provider says that you can. Get help right away if:  You develop sudden, severe back pain. If this happens, stop doing the exercises right away. Do not do them again unless your health care provider says that you can. This information is not intended to replace advice given to you by your health care provider. Make sure you discuss any questions you have with your health care provider. Document Released: 12/20/2004 Document Revised: 03/21/2016 Document Reviewed: 01/06/2015 Elsevier Interactive Patient Education  2017  [employer].

## 2018-08-03 NOTE — Progress Notes (Signed)
Subjective:    Patient ID: Shirley Wells, female    DOB: Apr 06, 1941, 77 y.o.   MRN: [id number]  HPI The patient is here for follow up.  Hypertension: She takes her medication daily, but admits that she did forget to take them yesterday. She is compliant with a low sodium diet.  She denies chest pain, palpitations, shortness of breath and regular headaches. She is very active, but not exercising regularly.  She does monitor her blood pressure at home - she thinks it was well controlled.    CKD, stage 3:  She does not take any nsaids.  She drinks a good amount of water a day.  She has not seen a kidney doctor in the past.    Leg edema:  She takes lasix only as needed, she only takes it a couple of times a year if that.  She would typically only have edema with travel.  She has had increased edema recently. She has swelling daily but it does go down overnight.  She elevates her legs when she is sitting.  She has tried compression socks, but they do not work and does not want to use them.  She denies medication or diet changes.   H/o stroke:  She takes plavix daily.  She takes aspirin a couple of times a week.  She does not tolerate statins.    Back pain in right lower back and radiates down to knee posteriorly.  She has not taken anything.  She does not want to do PT at this time and does not want any medications.    Medications and allergies reviewed with patient and updated if appropriate.  Patient Active Problem List   Diagnosis Date Noted  . Malignant neoplasm of upper-inner quadrant of left breast in female, estrogen receptor positive ([place]) 06/20/2017  . CKD (chronic kidney disease), stage III ([place]) 11/02/2016  . Osteopenia 04/30/2014  . Edema 09/26/2010  . PEPTIC ULCER DISEASE 03/21/2010  . ARTHRITIS 03/21/2010  . Dyslipidemia 03/20/2010  . Essential hypertension 03/20/2010  . History of CVA (cerebrovascular accident) 02/18/2010    Current Outpatient Medications on File Prior to  Visit  Medication Sig Dispense Refill  . amLODipine (NORVASC) 5 MG tablet Take 1 tablet (5 mg total) by mouth daily. 90 tablet 3  . Ascorbic Acid (VITAMIN C) 1000 MG tablet Take 1,000 mg by mouth daily.    Marland Kitchen aspirin 81 MG tablet Take 81 mg by mouth 2 (two) times a week.     Marland Kitchen b complex vitamins tablet Take 1 tablet by mouth daily.    . Cholecalciferol (VITAMIN D3) 1000 UNIT/SPRAY LIQD Take 2 sprays by mouth daily.    . clopidogrel (PLAVIX) 75 MG tablet Take 1 tablet (75 mg total) by mouth daily. 90 tablet 3  . furosemide (LASIX) 20 MG tablet Take 20 mg by mouth daily as needed for fluid.     Marland Kitchen lisinopril (PRINIVIL,ZESTRIL) 10 MG tablet Take 1 tablet (10 mg total) by mouth daily. 90 tablet 3  . loratadine (CLARITIN) 10 MG tablet Take 10 mg by mouth daily as needed for allergies.     . Menthol, Topical Analgesic, (BIOFREEZE EX) Apply 1 application topically daily as needed (muscle pain).    . Multiple Vitamin (MULTIVITAMIN) capsule Take 1 capsule by mouth daily.    Marland Kitchen SHARK CARTILAGE PO Take 100 mg by mouth daily.    . tamoxifen (NOLVADEX) 20 MG tablet Take 1 tablet (20 mg total) by mouth daily.  30 tablet 2   No current facility-administered medications on file prior to visit.     Past Medical History:  Diagnosis Date  . CHICKENPOX, HX OF 03/21/2010   Qualifier: Diagnosis of  By: Marca Ancona RMA, Lucy    . 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 Dyslipidemia    intol of statin due to >3x increase LFTs  . GERD (gastroesophageal reflux disease)    hx  . Hypertension, essential   . Malignant neoplasm of upper-inner quadrant of left breast in female, estrogen receptor positive ([place]) 06/20/2017  . Osteoarthritis   . Stroke [facility])     Past Surgical History:  Procedure Laterality Date  . CHOLECYSTECTOMY  1981  . MASTECTOMY W/ SENTINEL NODE BIOPSY Left 07/30/2017   Procedure: LEFT TOTAL MASTECTOMY WITH AXILLARY  SENTINEL LYMPH NODE BIOPSY;  Surgeon: Rolm Bookbinder, MD;  Location: [place] [facility];  Service: General;  Laterality: Left;    Social History   Socioeconomic History  . Marital status: Divorced    Spouse name: Not on file  . Number of children: Not on file  . Years of education: Not on file  . Highest education level: Not on file  Occupational History  . Not on file  Social Needs  . Financial resource strain: Not on file  . Food insecurity:    Worry: Not on file    Inability: Not on file  . Transportation needs:    Medical: Not on file    Non-medical: Not on file  Tobacco Use  . Smoking status: Never Smoker  . Smokeless tobacco: Never Used  Substance and Sexual Activity  . Alcohol use: No    Alcohol/week: 0.0 standard drinks  . Drug use: No  . Sexual activity: Not on file  Lifestyle  . Physical activity:    Days per week: Not on file    Minutes per session: Not on file  . Stress: Not on file  Relationships  . Social connections:    Talks on phone: Not on file    Gets together: Not on file    Attends religious service: Not on file    Active member of club or organization: Not on file    Attends meetings of clubs or organizations: Not on file    Relationship status: Not on file  Other Topics Concern  . Not on file  Social History Narrative   Divorced  >40 yrs, single and lives alone.    Supportive daughters, 1 is a Therapist, sports at [employer] [place]).    Patient enjoys gardening, dancing and caring for her grand children.    Family History  Problem Relation Age of Onset  . Breast cancer Mother 68  . Arthritis Other   . Breast cancer Maternal Aunt     Review of Systems  Constitutional: Negative for fever.  Respiratory: Negative for cough, shortness of breath and wheezing.   Cardiovascular: Positive for leg swelling. Negative for chest pain and palpitations.  Musculoskeletal: Positive for back pain (to right buttock and down to knee posteriorly).  Neurological:  Negative for weakness, light-headedness, numbness and headaches.       Objective:   Vitals:   08/05/18 0811  BP: (!) 152/68  Pulse: (!) 53  Resp: 16  Temp: 98.1 F (36.7 C)  SpO2: 96%   BP Readings from Last 3 Encounters:  08/05/18 (!) 152/68  02/10/18 132/72  08/23/17 (!) 149/67  Wt Readings from Last 3 Encounters:  08/05/18 156 lb (70.8 kg)  02/10/18 156 lb (70.8 kg)  08/23/17 153 lb 1.6 oz (69.4 kg)   Body mass index is 27.63 kg/m.   Physical Exam    Constitutional: Appears well-developed and well-nourished. No distress.  HENT:  Head: Normocephalic and atraumatic.  Neck: Neck supple. No tracheal deviation present. No thyromegaly present.  No cervical lymphadenopathy Cardiovascular: Normal rate, regular rhythm and normal heart sounds.   No murmur heard. No carotid bruit .  Mild non-pitting edema LLE > RLE Pulmonary/Chest: Effort normal and breath sounds normal. No respiratory distress. No has no wheezes. No rales.  MSK: no lumbar spine or sacrum pain with palpation - no deformity Skin: Skin is warm and dry. Not diaphoretic.  Psychiatric: Normal mood and affect. Behavior is normal.      Assessment & Plan:    See Problem List for Assessment and Plan of chronic medical problems.

## 2018-08-05 ENCOUNTER — Other Ambulatory Visit (INDEPENDENT_AMBULATORY_CARE_PROVIDER_SITE_OTHER): Payer: Medicare FFS

## 2018-08-05 ENCOUNTER — Encounter: Payer: Self-pay | Admitting: Internal Medicine

## 2018-08-05 ENCOUNTER — Ambulatory Visit: Payer: Medicare FFS | Admitting: Internal Medicine

## 2018-08-05 VITALS — BP 152/68 | HR 53 | Temp 98.1°F | Resp 16 | Ht 63.0 in | Wt 156.0 lb

## 2018-08-05 DIAGNOSIS — N183 Chronic kidney disease, stage 3 unspecified: Secondary | ICD-10-CM

## 2018-08-05 DIAGNOSIS — E785 Hyperlipidemia, unspecified: Secondary | ICD-10-CM

## 2018-08-05 DIAGNOSIS — Z8673 Personal history of transient ischemic attack (TIA), and cerebral infarction without residual deficits: Secondary | ICD-10-CM | POA: Diagnosis not present

## 2018-08-05 DIAGNOSIS — R600 Localized edema: Secondary | ICD-10-CM

## 2018-08-05 DIAGNOSIS — I10 Essential (primary) hypertension: Secondary | ICD-10-CM

## 2018-08-05 LAB — CBC WITH DIFFERENTIAL/PLATELET
Basophils Absolute: 0 10*3/uL (ref 0.0–0.1)
Basophils Relative: 0.8 % (ref 0.0–3.0)
Eosinophils Absolute: 0.2 10*3/uL (ref 0.0–0.7)
Eosinophils Relative: 3.5 % (ref 0.0–5.0)
HCT: 39.4 % (ref 36.0–46.0)
Hemoglobin: 13.5 g/dL (ref 12.0–15.0)
Lymphocytes Relative: 42.5 % (ref 12.0–46.0)
Lymphs Abs: 2.8 10*3/uL (ref 0.7–4.0)
MCHC: 34.2 g/dL (ref 30.0–36.0)
MCV: 99 fl (ref 78.0–100.0)
Monocytes Absolute: 0.5 10*3/uL (ref 0.1–1.0)
Monocytes Relative: 6.9 % (ref 3.0–12.0)
Neutro Abs: 3 10*3/uL (ref 1.4–7.7)
Neutrophils Relative %: 46.3 % (ref 43.0–77.0)
Platelets: 195 10*3/uL (ref 150.0–400.0)
RBC: 3.99 Mil/uL (ref 3.87–5.11)
RDW: 13.5 % (ref 11.5–15.5)
WBC: 6.5 10*3/uL (ref 4.0–10.5)

## 2018-08-05 LAB — LIPID PANEL
Cholesterol: 302 mg/dL — ABNORMAL HIGH (ref 0–200)
HDL: 51.1 mg/dL (ref >39.00)
NonHDL: 250.78
Total CHOL/HDL Ratio: 6
Triglycerides: 255 mg/dL — ABNORMAL HIGH (ref 0.0–149.0)
VLDL: 51 mg/dL — ABNORMAL HIGH (ref 0.0–40.0)

## 2018-08-05 LAB — COMPREHENSIVE METABOLIC PANEL
ALT: 22 U/L (ref 0–35)
AST: 20 U/L (ref 0–37)
Albumin: 3.9 g/dL (ref 3.5–5.2)
Alkaline Phosphatase: 134 U/L — ABNORMAL HIGH (ref 39–117)
BUN: 24 mg/dL — ABNORMAL HIGH (ref 6–23)
CO2: 26 mEq/L (ref 19–32)
Calcium: 9.4 mg/dL (ref 8.4–10.5)
Chloride: 108 mEq/L (ref 96–112)
Creatinine, Ser: 1.59 mg/dL — ABNORMAL HIGH (ref 0.40–1.20)
GFR: 33.37 mL/min — ABNORMAL LOW (ref >60.00)
Glucose, Bld: 98 mg/dL (ref 70–99)
Potassium: 4.5 mEq/L (ref 3.5–5.1)
Sodium: 142 mEq/L (ref 135–145)
Total Bilirubin: 0.6 mg/dL (ref 0.2–1.2)
Total Protein: 7.3 g/dL (ref 6.0–8.3)

## 2018-08-05 LAB — LDL CHOLESTEROL, DIRECT: Direct LDL: 93 mg/dL

## 2018-08-05 NOTE — Assessment & Plan Note (Signed)
Continue plavix daily, intolerant to statins Stressed good BP control - monitor it at home - do not forget medications

## 2018-08-05 NOTE — Assessment & Plan Note (Signed)
Cmp, cbc today Stressed good BP control Advised we may need to change some of her medications I would like her to see a kidney specialist, but she deferred referral at this time - she would like to wait No nsaids Continue increased water intake

## 2018-08-05 NOTE — Assessment & Plan Note (Signed)
Intolerant to statins Will check lipids, cmp

## 2018-08-05 NOTE — Assessment & Plan Note (Signed)
BP high today but she did not take her medication yesterday - she states she does take it every day BP at home controlled Stressed the importance of keeping her BP well controlled and taking medication Cmp, cbc

## 2018-08-05 NOTE — Assessment & Plan Note (Signed)
Does not like taking lasix and rarely takes it - typically only if she travels Has had mild edema more consistently encouraged retrying compression socks Elevate legs when sitting Avoid salt cmp She deferred nephrology referral - discussed worsening edema may be from worsening kidney function

## 2018-08-07 ENCOUNTER — Encounter: Payer: Self-pay | Admitting: Internal Medicine

## 2018-08-13 ENCOUNTER — Ambulatory Visit: Payer: Medicare FFS | Admitting: Internal Medicine

## 2019-02-02 NOTE — Progress Notes (Signed)
Subjective:    Patient ID: Shirley Wells, female    DOB: 12-18-1940, 78 y.o.   MRN: [id number]  HPI The patient is here for follow up.  She is here with her daughter.    She eats healthy - fruits and veges.  She is not exercising.    Hypertension: She is taking her medication daily, but admits that on occasion she forgets. She is compliant with a low sodium diet, but does eat out a couple of times a week.  She denies chest pain, palpitations, edema, shortness of breath and regular headaches. She is not exercising regularly.  She does not monitor her blood pressure at home.    CKD, stage 3:  She drinks water.  She does not take nsaids.    Leg edema:  She takes lasix as needed.  She only takes it a couple of times a year.  She only has swelling with travel.    H/o CVA:  She takes plavix daily and states she never forgets to take it.  She is statin intolerant and does not want to try any other cholesterol medication.    Medications and allergies reviewed with patient and updated if appropriate.  Patient Active Problem List   Diagnosis Date Noted  . Malignant neoplasm of upper-inner quadrant of left breast in female, estrogen receptor positive ([place]) 06/20/2017  . CKD (chronic kidney disease), stage III ([place]) 11/02/2016  . Osteopenia 04/30/2014  . Edema 09/26/2010  . PEPTIC ULCER DISEASE 03/21/2010  . ARTHRITIS 03/21/2010  . Dyslipidemia 03/20/2010  . Essential hypertension 03/20/2010  . History of CVA (cerebrovascular accident) 02/18/2010    Current Outpatient Medications on File Prior to Visit  Medication Sig Dispense Refill  . amLODipine (NORVASC) 5 MG tablet Take 1 tablet (5 mg total) by mouth daily. 90 tablet 3  . Ascorbic Acid (VITAMIN C) 1000 MG tablet Take 1,000 mg by mouth daily.    Marland Kitchen aspirin 81 MG tablet Take 81 mg by mouth 2 (two) times a week.     Marland Kitchen b complex vitamins tablet Take 1 tablet by mouth daily.    . Cholecalciferol (VITAMIN D3) 1000 UNIT/SPRAY LIQD Take 2  sprays by mouth daily.    . clopidogrel (PLAVIX) 75 MG tablet Take 1 tablet (75 mg total) by mouth daily. 90 tablet 3  . furosemide (LASIX) 20 MG tablet Take 20 mg by mouth daily as needed for fluid.     Marland Kitchen lisinopril (PRINIVIL,ZESTRIL) 10 MG tablet Take 1 tablet (10 mg total) by mouth daily. 90 tablet 3  . loratadine (CLARITIN) 10 MG tablet Take 10 mg by mouth daily as needed for allergies.     . Menthol, Topical Analgesic, (BIOFREEZE EX) Apply 1 application topically daily as needed (muscle pain).    . Multiple Vitamin (MULTIVITAMIN) capsule Take 1 capsule by mouth daily.    Marland Kitchen SHARK CARTILAGE PO Take 100 mg by mouth daily.    . tamoxifen (NOLVADEX) 20 MG tablet Take 1 tablet (20 mg total) by mouth daily. 30 tablet 2   No current facility-administered medications on file prior to visit.     Past Medical History:  Diagnosis Date  . CHICKENPOX, HX OF 03/21/2010   Qualifier: Diagnosis of  By: Marca Ancona RMA, Lucy    . 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 Dyslipidemia    intol of statin due to >3x increase LFTs  .  GERD (gastroesophageal reflux disease)    hx  . Hypertension, essential   . Malignant neoplasm of upper-inner quadrant of left breast in female, estrogen receptor positive ([place]) 06/20/2017  . Osteoarthritis   . Stroke [facility])     Past Surgical History:  Procedure Laterality Date  . CHOLECYSTECTOMY  1981  . MASTECTOMY W/ SENTINEL NODE BIOPSY Left 07/30/2017   Procedure: LEFT TOTAL MASTECTOMY WITH AXILLARY SENTINEL LYMPH NODE BIOPSY;  Surgeon: Rolm Bookbinder, MD;  Location: [facility];  Service: General;  Laterality: Left;    Social History   Socioeconomic History  . Marital status: Divorced    Spouse name: Not on file  . Number of children: Not on file  . Years of education: Not on file  . Highest education level: Not on file  Occupational History  . Not on file  Social Needs  . Financial  resource strain: Not on file  . Food insecurity:    Worry: Not on file    Inability: Not on file  . Transportation needs:    Medical: Not on file    Non-medical: Not on file  Tobacco Use  . Smoking status: Never Smoker  . Smokeless tobacco: Never Used  Substance and Sexual Activity  . Alcohol use: No    Alcohol/week: 0.0 standard drinks  . Drug use: No  . Sexual activity: Not on file  Lifestyle  . Physical activity:    Days per week: Not on file    Minutes per session: Not on file  . Stress: Not on file  Relationships  . Social connections:    Talks on phone: Not on file    Gets together: Not on file    Attends religious service: Not on file    Active member of club or organization: Not on file    Attends meetings of clubs or organizations: Not on file    Relationship status: Not on file  Other Topics Concern  . Not on file  Social History Narrative   Divorced  >40 yrs, single and lives alone.    Supportive daughters, 1 is a Therapist, sports at [employer]).    Patient enjoys gardening, dancing and caring for her grand children.    Family History  Problem Relation Age of Onset  . Breast cancer Mother 1  . Arthritis Other   . Breast cancer Maternal Aunt     Review of Systems  Constitutional: Negative for appetite change, chills and fever.  Respiratory: Negative for cough, shortness of breath and wheezing.   Cardiovascular: Negative for chest pain, palpitations and leg swelling (only when traveling).  Gastrointestinal: Negative for abdominal pain and nausea.  Neurological: Negative for light-headedness and headaches.  Psychiatric/Behavioral: Negative for dysphoric mood and sleep disturbance. The patient is not nervous/anxious.        Objective:   Vitals:   02/03/19 1107  BP: (!) 148/74  Pulse: (!) 58  Resp: 16  Temp: 97.7 F (36.5 C)  SpO2: 97%   BP Readings from Last 3 Encounters:  02/03/19 (!) 148/74  08/05/18 (!) 152/68  02/10/18 132/72   Wt  Readings from Last 3 Encounters:  02/03/19 156 lb 12.8 oz (71.1 kg)  08/05/18 156 lb (70.8 kg)  02/10/18 156 lb (70.8 kg)   Body mass index is 27.78 kg/m.   Physical Exam    Constitutional: Appears well-developed and well-nourished. No distress.  HENT:  Head: Normocephalic and atraumatic.  Neck: Neck supple. No tracheal deviation present. No thyromegaly present.  No cervical lymphadenopathy Cardiovascular: Normal rate, regular rhythm and normal heart sounds.   No murmur heard. No carotid bruit .  No edema Pulmonary/Chest: Effort normal and breath sounds normal. No respiratory distress. No has no wheezes. No rales.  Skin: Skin is warm and dry. Not diaphoretic.  Psychiatric: Normal mood and affect. Behavior is normal.      Assessment & Plan:    See Problem List for Assessment and Plan of chronic medical problems.

## 2019-02-03 ENCOUNTER — Ambulatory Visit: Payer: Medicare FFS | Admitting: Internal Medicine

## 2019-02-03 ENCOUNTER — Encounter: Payer: Self-pay | Admitting: Internal Medicine

## 2019-02-03 ENCOUNTER — Other Ambulatory Visit (INDEPENDENT_AMBULATORY_CARE_PROVIDER_SITE_OTHER): Payer: Medicare FFS

## 2019-02-03 VITALS — BP 148/74 | HR 58 | Temp 97.7°F | Resp 16 | Ht 63.0 in | Wt 156.8 lb

## 2019-02-03 DIAGNOSIS — R739 Hyperglycemia, unspecified: Secondary | ICD-10-CM

## 2019-02-03 DIAGNOSIS — R7303 Prediabetes: Secondary | ICD-10-CM | POA: Insufficient documentation

## 2019-02-03 DIAGNOSIS — E785 Hyperlipidemia, unspecified: Secondary | ICD-10-CM

## 2019-02-03 DIAGNOSIS — Z8673 Personal history of transient ischemic attack (TIA), and cerebral infarction without residual deficits: Secondary | ICD-10-CM

## 2019-02-03 DIAGNOSIS — R600 Localized edema: Secondary | ICD-10-CM

## 2019-02-03 DIAGNOSIS — N183 Chronic kidney disease, stage 3 unspecified: Secondary | ICD-10-CM

## 2019-02-03 DIAGNOSIS — I10 Essential (primary) hypertension: Secondary | ICD-10-CM

## 2019-02-03 LAB — COMPREHENSIVE METABOLIC PANEL
ALT: 20 U/L (ref 0–35)
AST: 19 U/L (ref 0–37)
Albumin: 4.1 g/dL (ref 3.5–5.2)
Alkaline Phosphatase: 151 U/L — ABNORMAL HIGH (ref 39–117)
BUN: 32 mg/dL — ABNORMAL HIGH (ref 6–23)
CO2: 27 mEq/L (ref 19–32)
Calcium: 9.5 mg/dL (ref 8.4–10.5)
Chloride: 105 mEq/L (ref 96–112)
Creatinine, Ser: 1.56 mg/dL — ABNORMAL HIGH (ref 0.40–1.20)
GFR: 32.05 mL/min — ABNORMAL LOW (ref >60.00)
Glucose, Bld: 91 mg/dL (ref 70–99)
Potassium: 5 mEq/L (ref 3.5–5.1)
Sodium: 140 mEq/L (ref 135–145)
Total Bilirubin: 0.5 mg/dL (ref 0.2–1.2)
Total Protein: 7.5 g/dL (ref 6.0–8.3)

## 2019-02-03 LAB — LIPID PANEL
Cholesterol: 332 mg/dL — ABNORMAL HIGH (ref 0–200)
HDL: 55.8 mg/dL (ref >39.00)
NonHDL: 275.99
Total CHOL/HDL Ratio: 6
Triglycerides: 264 mg/dL — ABNORMAL HIGH (ref 0.0–149.0)
VLDL: 52.8 mg/dL — ABNORMAL HIGH (ref 0.0–40.0)

## 2019-02-03 LAB — CBC WITH DIFFERENTIAL/PLATELET
Basophils Absolute: 0.1 10*3/uL (ref 0.0–0.1)
Basophils Relative: 1.1 % (ref 0.0–3.0)
Eosinophils Absolute: 0.3 10*3/uL (ref 0.0–0.7)
Eosinophils Relative: 5.2 % — ABNORMAL HIGH (ref 0.0–5.0)
HCT: 41.6 % (ref 36.0–46.0)
Hemoglobin: 14.2 g/dL (ref 12.0–15.0)
Lymphocytes Relative: 45.3 % (ref 12.0–46.0)
Lymphs Abs: 3 10*3/uL (ref 0.7–4.0)
MCHC: 34.2 g/dL (ref 30.0–36.0)
MCV: 98.9 fl (ref 78.0–100.0)
Monocytes Absolute: 0.4 10*3/uL (ref 0.1–1.0)
Monocytes Relative: 6.7 % (ref 3.0–12.0)
Neutro Abs: 2.7 10*3/uL (ref 1.4–7.7)
Neutrophils Relative %: 41.7 % — ABNORMAL LOW (ref 43.0–77.0)
Platelets: 218 10*3/uL (ref 150.0–400.0)
RBC: 4.21 Mil/uL (ref 3.87–5.11)
RDW: 13.2 % (ref 11.5–15.5)
WBC: 6.6 10*3/uL (ref 4.0–10.5)

## 2019-02-03 LAB — LDL CHOLESTEROL, DIRECT: Direct LDL: 112 mg/dL

## 2019-02-03 LAB — HEMOGLOBIN A1C: Hgb A1c MFr Bld: 5.8 % (ref 4.6–6.5)

## 2019-02-03 MED ORDER — LISINOPRIL 10 MG PO TABS: 10.0000 mg | ORAL_TABLET | Freq: Every day | ORAL | 3 refills | Status: DC

## 2019-02-03 MED ORDER — CLOPIDOGREL BISULFATE 75 MG PO TABS: 75.0000 mg | ORAL_TABLET | Freq: Every day | ORAL | 3 refills | Status: DC

## 2019-02-03 MED ORDER — AMLODIPINE BESYLATE 5 MG PO TABS: 5.0000 mg | ORAL_TABLET | Freq: Every day | ORAL | 3 refills | Status: DC

## 2019-02-03 NOTE — Assessment & Plan Note (Signed)
Has remained stable over the past several years Discussed to continue increased water intake and avoiding NSAIDs CMP

## 2019-02-03 NOTE — Assessment & Plan Note (Signed)
Rarely has edema with travel  Takes Lasix maybe twice a year

## 2019-02-03 NOTE — Assessment & Plan Note (Signed)
Taking Plavix daily Takes aspirin on occasion Statin intolerant and does not want to take anything else for cholesterol No concerning strokelike symptoms Lipid panel, CMP, CBC

## 2019-02-03 NOTE — Patient Instructions (Addendum)
  Tests ordered today. Your results will be released to [facility] (or called to you) after review, usually within 72hours after test completion. If any changes need to be made, you will be notified at that same time.   Medications reviewed and updated.  Changes include :  none   Your prescription(s) have been submitted to your pharmacy. Please take as directed and contact our office if you believe you are having problem(s) with the medication(s).   Please followup in 7 months

## 2019-02-03 NOTE — Assessment & Plan Note (Signed)
BP slightly elevated here -occasionally she forgets her medication Stressed the importance of taking her medication on a daily basis CMP-May need to discontinue lisinopril depending on GFR Stressed the importance of regular exercise Low-sodium diet

## 2019-02-03 NOTE — Assessment & Plan Note (Signed)
Statin intolerant She does not want to take any other medication for her cholesterol Lipid panel, CMP

## 2019-02-03 NOTE — Assessment & Plan Note (Signed)
History of hyperglycemia ?Check A1c ?

## 2019-02-04 ENCOUNTER — Telehealth: Payer: Self-pay

## 2019-02-04 NOTE — Telephone Encounter (Signed)
Faxed signed order back to [facility] sent to HIM for scanning to chart.

## 2019-02-05 ENCOUNTER — Encounter: Payer: Self-pay | Admitting: Internal Medicine

## 2019-05-14 ENCOUNTER — Other Ambulatory Visit: Payer: Self-pay | Admitting: Internal Medicine

## 2019-06-05 ENCOUNTER — Other Ambulatory Visit: Payer: Self-pay | Admitting: Internal Medicine

## 2019-09-13 NOTE — Progress Notes (Signed)
Virtual Visit via telephone note  I connected with Shirley Wells on 09/14/19 at 11:15 AM EDT by a telephone and verified that I am speaking with the correct person using two identifiers.   I discussed the limitations of evaluation and management by telemedicine and the availability of in person appointments. The patient expressed understanding and agreed to proceed.  The patient is currently at home and I am in the office.    No referring provider.    History of Present Illness: She is here for follow up of her chronic medical conditions.   Overall she feels well and feels she is doing well.  She does not want to come to the doctor's office because of the Covid pandemic.  She is exercising regularly - walking.    Hypertension: She is taking her medication daily. She is compliant with a low sodium diet.  She denies chest pain, palpitations, edema, shortness of breath and regular headaches. She does monitor her blood pressure at home - this morning it was 133/67.    CKD, stage 3, leg edema:  She takes the lasix as needed and only take it rarely.  She drinks water during the day and does not take nsaids.   H/o CVA, dyslipidemia:  She takes plavix daily.  She is statin intolerant and does not want to be on cholesterol medication.    Osteopenia:  Her dexa is due.  She is not exercising regularly.  She is taking a multivitamin and vitamin D daily.   Review of Systems  Constitutional: Negative for chills and fever.  Respiratory: Negative for cough, shortness of breath and wheezing.   Cardiovascular: Negative for chest pain, palpitations and leg swelling.  Neurological: Negative for dizziness and headaches.     Social History   Socioeconomic History  . Marital status: Divorced    Spouse name: Not on file  . Number of children: Not on file  . Years of education: Not on file  . Highest education level: Not on file  Occupational History  . Not on file  Social Needs  . Financial  resource strain: Not on file  . Food insecurity    Worry: Not on file    Inability: Not on file  . Transportation needs    Medical: Not on file    Non-medical: Not on file  Tobacco Use  . Smoking status: Never Smoker  . Smokeless tobacco: Never Used  Substance and Sexual Activity  . Alcohol use: No    Alcohol/week: 0.0 standard drinks  . Drug use: No  . Sexual activity: Not on file  Lifestyle  . Physical activity    Days per week: Not on file    Minutes per session: Not on file  . Stress: Not on file  Relationships  . Social Herbalist on phone: Not on file    Gets together: Not on file    Attends religious service: Not on file    Active member of club or organization: Not on file    Attends meetings of clubs or organizations: Not on file    Relationship status: Not on file  Other Topics Concern  . Not on file  Social History Narrative   Divorced  >40 yrs, single and lives alone.    Supportive daughters, 1 is a Therapist, sports at [employer]).    Patient enjoys gardening, dancing and caring for her grand children.       Assessment and Plan:  See  Problem List for Assessment and Plan of chronic medical problems.   Follow Up Instructions:    I discussed the assessment and treatment plan with the patient. The patient was provided an opportunity to ask questions and all were answered. The patient agreed with the plan and demonstrated an understanding of the instructions.   The patient was advised to call back or seek an in-person evaluation if the symptoms worsen or if the condition fails to improve as anticipated.  Follow-up in 6 months with blood work at that time  Telephone call: 12 minutes  Binnie Rail, MD

## 2019-09-14 ENCOUNTER — Encounter: Payer: Self-pay | Admitting: Internal Medicine

## 2019-09-14 ENCOUNTER — Ambulatory Visit (INDEPENDENT_AMBULATORY_CARE_PROVIDER_SITE_OTHER): Payer: Medicare PPO | Admitting: Internal Medicine

## 2019-09-14 DIAGNOSIS — R600 Localized edema: Secondary | ICD-10-CM

## 2019-09-14 DIAGNOSIS — N1832 Chronic kidney disease, stage 3b: Secondary | ICD-10-CM

## 2019-09-14 DIAGNOSIS — E785 Hyperlipidemia, unspecified: Secondary | ICD-10-CM

## 2019-09-14 DIAGNOSIS — Z8673 Personal history of transient ischemic attack (TIA), and cerebral infarction without residual deficits: Secondary | ICD-10-CM | POA: Diagnosis not present

## 2019-09-14 DIAGNOSIS — I129 Hypertensive chronic kidney disease with stage 1 through stage 4 chronic kidney disease, or unspecified chronic kidney disease: Secondary | ICD-10-CM | POA: Diagnosis not present

## 2019-09-14 DIAGNOSIS — C50212 Malignant neoplasm of upper-inner quadrant of left female breast: Secondary | ICD-10-CM

## 2019-09-14 DIAGNOSIS — R739 Hyperglycemia, unspecified: Secondary | ICD-10-CM

## 2019-09-14 DIAGNOSIS — M85851 Other specified disorders of bone density and structure, right thigh: Secondary | ICD-10-CM

## 2019-09-14 DIAGNOSIS — Z170 Estrogen receptor positive status [ER+]: Secondary | ICD-10-CM

## 2019-09-14 DIAGNOSIS — I10 Essential (primary) hypertension: Secondary | ICD-10-CM

## 2019-09-14 NOTE — Assessment & Plan Note (Signed)
She is eating well and walking regularly She has gained weight Will check a1c at her next visit

## 2019-09-14 NOTE — Assessment & Plan Note (Signed)
Drinking water throughout day Does not take NSAIDs She does not want to come to the office due to the Covid pandemic so we will need to hold off on blood work until her next visit Her kidney function has been stable, so okay to hold off her now

## 2019-09-14 NOTE — Assessment & Plan Note (Signed)
Has not seen Oncology in > 1 year Not taking tamoxifen

## 2019-09-14 NOTE — Assessment & Plan Note (Signed)
She is walking regularly She is taking her vitamin D on a daily basis along with a multivitamin Will discuss DEXA and do blood work at her next visit

## 2019-09-14 NOTE — Assessment & Plan Note (Signed)
On takes lasix when she travels - has not traveling therefore has not had any edema Can continue prn lasix

## 2019-09-14 NOTE — Assessment & Plan Note (Addendum)
Taking plavix, statin intolerant BP controlled at home

## 2019-09-14 NOTE — Assessment & Plan Note (Signed)
Blood pressure well controlled at home Continue current medications Will need to follow-up in 6 months that we can do blood work at that time

## 2019-09-14 NOTE — Assessment & Plan Note (Signed)
Statin intolerant - does not want to retry Will recheck lipids at next visit

## 2020-03-13 NOTE — Progress Notes (Signed)
Subjective:    Patient ID: Shirley Wells, female    DOB: 07-24-1941, 79 y.o.   MRN: [id number]  HPI The patient is here for follow up of their chronic medical problems, including hypertension, prediabetes, CKD, stage 3, leg edema, h/o CVA, dyslipidemia, osteopenia.  Her daughter is here with her.  She is taking all of her medications as prescribed.   She is not exercising regularly.   She does some yard work and walks on occasion.   She does check her blood pressure at home on occasion and feels it is much better than here and is well controlled.    Medications and allergies reviewed with patient and updated if appropriate.  Patient Active Problem List   Diagnosis Date Noted  . Hyperglycemia 02/03/2019  . Malignant neoplasm of upper-inner quadrant of left breast in female, estrogen receptor positive ([place]) 06/20/2017  . CKD (chronic kidney disease), stage III 11/02/2016  . Osteopenia 04/30/2014  . Edema 09/26/2010  . PEPTIC ULCER DISEASE 03/21/2010  . ARTHRITIS 03/21/2010  . Dyslipidemia 03/20/2010  . Essential hypertension 03/20/2010  . History of CVA (cerebrovascular accident) 02/18/2010    Current Outpatient Medications on File Prior to Visit  Medication Sig Dispense Refill  . amLODipine (NORVASC) 5 MG tablet Take 1 tablet (5 mg total) by mouth daily. 90 tablet 3  . Ascorbic Acid (VITAMIN C) 1000 MG tablet Take 1,000 mg by mouth daily.    Marland Kitchen aspirin 81 MG tablet Take 81 mg by mouth 2 (two) times a week.     Marland Kitchen b complex vitamins tablet Take 1 tablet by mouth daily.    . Cholecalciferol (VITAMIN D3) 1000 UNIT/SPRAY LIQD Take 2 sprays by mouth daily.    . clopidogrel (PLAVIX) 75 MG tablet Take 1 tablet (75 mg total) by mouth daily. 90 tablet 3  . furosemide (LASIX) 20 MG tablet Take 20 mg by mouth daily as needed for fluid.     Marland Kitchen lisinopril (ZESTRIL) 10 MG tablet TAKE 1 TABLET(10 MG) BY MOUTH DAILY 90 tablet 0  . loratadine (CLARITIN) 10 MG tablet Take 10 mg by mouth daily as  needed for allergies.     . Menthol, Topical Analgesic, (BIOFREEZE EX) Apply 1 application topically daily as needed (muscle pain).    . Multiple Vitamin (MULTIVITAMIN) capsule Take 1 capsule by mouth daily.    Marland Kitchen SHARK CARTILAGE PO Take 100 mg by mouth daily.     No current facility-administered medications on file prior to visit.    Past Medical History:  Diagnosis Date  . CHICKENPOX, HX OF 03/21/2010   Qualifier: Diagnosis of  By: Marca Ancona RMA, Lucy    . 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 Dyslipidemia    intol of statin due to >3x increase LFTs  . GERD (gastroesophageal reflux disease)    hx  . Hypertension, essential   . Malignant neoplasm of upper-inner quadrant of left breast in female, estrogen receptor positive ([place]) 06/20/2017  . Osteoarthritis   . Stroke [facility])     Past Surgical History:  Procedure Laterality Date  . CHOLECYSTECTOMY  1981  . MASTECTOMY W/ SENTINEL NODE BIOPSY Left 07/30/2017   Procedure: LEFT TOTAL MASTECTOMY WITH AXILLARY SENTINEL LYMPH NODE BIOPSY;  Surgeon: Rolm Bookbinder, MD;  Location: [place];  Service: General;  Laterality: Left;    Social History   Socioeconomic History  . Marital status: Divorced  Spouse name: Not on file  . Number of children: Not on file  . Years of education: Not on file  . Highest education level: Not on file  Occupational History  . Not on file  Tobacco Use  . Smoking status: Never Smoker  . Smokeless tobacco: Never Used  Substance and Sexual Activity  . Alcohol use: No    Alcohol/week: 0.0 standard drinks  . Drug use: No  . Sexual activity: Not on file  Other Topics Concern  . Not on file  Social History Narrative   Divorced  >40 yrs, single and lives alone.    Supportive daughters, 1 is a Therapist, sports at [employer]).    Patient enjoys gardening, dancing and caring for her grand children.   Social Determinants of  Health   Financial Resource Strain:   . Difficulty of Paying Living Expenses:   Food Insecurity:   . Worried About Charity fundraiser in the Last Year:   . Arboriculturist in the Last Year:   Transportation Needs:   . Film/video editor (Medical):   Marland Kitchen Lack of Transportation (Non-Medical):   Physical Activity:   . Days of Exercise per Week:   . Minutes of Exercise per Session:   Stress:   . Feeling of Stress :   Social Connections:   . Frequency of Communication with Friends and Family:   . Frequency of Social Gatherings with Friends and Family:   . Attends Religious Services:   . Active Member of Clubs or Organizations:   . Attends Archivist Meetings:   Marland Kitchen Marital Status:     Family History  Problem Relation Age of Onset  . Breast cancer Mother 33  . Arthritis Other   . Breast cancer Maternal Aunt     Review of Systems  Constitutional: Negative for chills and fever.  Respiratory: Negative for cough, shortness of breath and wheezing.   Cardiovascular: Negative for chest pain, palpitations and leg swelling.  Neurological: Negative for weakness, light-headedness, numbness and headaches.       Objective:   Vitals:   03/14/20 1100  BP: (!) 146/74  Pulse: 69  Resp: 16  Temp: 98.5 F (36.9 C)  SpO2: 96%   BP Readings from Last 3 Encounters:  03/14/20 (!) 146/74  02/03/19 (!) 148/74  08/05/18 (!) 152/68   Wt Readings from Last 3 Encounters:  03/14/20 156 lb (70.8 kg)  02/03/19 156 lb 12.8 oz (71.1 kg)  08/05/18 156 lb (70.8 kg)   Body mass index is 27.63 kg/m.   Physical Exam    Constitutional: Appears well-developed and well-nourished. No distress.  HENT:  Head: Normocephalic and atraumatic.  Neck: Neck supple. No tracheal deviation present. No thyromegaly present.  No cervical lymphadenopathy Cardiovascular: Normal rate, regular rhythm and normal heart sounds.   No murmur heard. No carotid bruit .  No edema Pulmonary/Chest: Effort normal  and breath sounds normal. No respiratory distress. No has no wheezes. No rales.  Skin: Skin is warm and dry. Not diaphoretic.  Psychiatric: Normal mood and affect. Behavior is normal.      Assessment & Plan:    See Problem List for Assessment and Plan of chronic medical problems.    This visit occurred during the SARS-CoV-2 public health emergency.  Safety protocols were in place, including screening questions prior to the visit, additional usage of staff PPE, and extensive cleaning of exam room while observing appropriate contact time as indicated for disinfecting  solutions.

## 2020-03-13 NOTE — Patient Instructions (Addendum)
  Blood work was ordered.     Medications reviewed and updated.  Changes include :   none  Your prescription(s) have been submitted to your pharmacy. Please take as directed and contact our office if you believe you are having problem(s) with the medication(s).   Please followup in 6 months   

## 2020-03-14 ENCOUNTER — Encounter: Payer: Self-pay | Admitting: Internal Medicine

## 2020-03-14 ENCOUNTER — Ambulatory Visit: Payer: Medicare FFS | Admitting: Internal Medicine

## 2020-03-14 ENCOUNTER — Other Ambulatory Visit: Payer: Self-pay

## 2020-03-14 VITALS — BP 146/74 | HR 69 | Temp 98.5°F | Resp 16 | Ht 63.0 in | Wt 156.0 lb

## 2020-03-14 DIAGNOSIS — I10 Essential (primary) hypertension: Secondary | ICD-10-CM | POA: Diagnosis not present

## 2020-03-14 DIAGNOSIS — M85851 Other specified disorders of bone density and structure, right thigh: Secondary | ICD-10-CM

## 2020-03-14 DIAGNOSIS — R739 Hyperglycemia, unspecified: Secondary | ICD-10-CM | POA: Diagnosis not present

## 2020-03-14 DIAGNOSIS — R600 Localized edema: Secondary | ICD-10-CM

## 2020-03-14 DIAGNOSIS — E785 Hyperlipidemia, unspecified: Secondary | ICD-10-CM

## 2020-03-14 DIAGNOSIS — N1832 Chronic kidney disease, stage 3b: Secondary | ICD-10-CM

## 2020-03-14 DIAGNOSIS — Z8673 Personal history of transient ischemic attack (TIA), and cerebral infarction without residual deficits: Secondary | ICD-10-CM | POA: Diagnosis not present

## 2020-03-14 LAB — CBC WITH DIFFERENTIAL/PLATELET
Basophils Absolute: 0 10*3/uL (ref 0.0–0.1)
Basophils Relative: 0.4 % (ref 0.0–3.0)
Eosinophils Absolute: 0.2 10*3/uL (ref 0.0–0.7)
Eosinophils Relative: 2.7 % (ref 0.0–5.0)
HCT: 39.7 % (ref 36.0–46.0)
Hemoglobin: 13.4 g/dL (ref 12.0–15.0)
Lymphocytes Relative: 35.3 % (ref 12.0–46.0)
Lymphs Abs: 2.4 10*3/uL (ref 0.7–4.0)
MCHC: 33.7 g/dL (ref 30.0–36.0)
MCV: 100.2 fl — ABNORMAL HIGH (ref 78.0–100.0)
Monocytes Absolute: 0.5 10*3/uL (ref 0.1–1.0)
Monocytes Relative: 7.3 % (ref 3.0–12.0)
Neutro Abs: 3.8 10*3/uL (ref 1.4–7.7)
Neutrophils Relative %: 54.3 % (ref 43.0–77.0)
Platelets: 212 10*3/uL (ref 150.0–400.0)
RBC: 3.97 Mil/uL (ref 3.87–5.11)
RDW: 13.6 % (ref 11.5–15.5)
WBC: 6.9 10*3/uL (ref 4.0–10.5)

## 2020-03-14 LAB — LIPID PANEL
Cholesterol: 298 mg/dL — ABNORMAL HIGH (ref 0–200)
HDL: 53.5 mg/dL (ref >39.00)
NonHDL: 244.83
Total CHOL/HDL Ratio: 6
Triglycerides: 261 mg/dL — ABNORMAL HIGH (ref 0.0–149.0)
VLDL: 52.2 mg/dL — ABNORMAL HIGH (ref 0.0–40.0)

## 2020-03-14 LAB — COMPREHENSIVE METABOLIC PANEL
ALT: 21 U/L (ref 0–35)
AST: 31 U/L (ref 0–37)
Albumin: 4 g/dL (ref 3.5–5.2)
Alkaline Phosphatase: 210 U/L — ABNORMAL HIGH (ref 39–117)
BUN: 24 mg/dL — ABNORMAL HIGH (ref 6–23)
CO2: 28 mEq/L (ref 19–32)
Calcium: 9.7 mg/dL (ref 8.4–10.5)
Chloride: 105 mEq/L (ref 96–112)
Creatinine, Ser: 1.71 mg/dL — ABNORMAL HIGH (ref 0.40–1.20)
GFR: 28.75 mL/min — ABNORMAL LOW (ref >60.00)
Glucose, Bld: 96 mg/dL (ref 70–99)
Potassium: 5.2 mEq/L — ABNORMAL HIGH (ref 3.5–5.1)
Sodium: 140 mEq/L (ref 135–145)
Total Bilirubin: 0.5 mg/dL (ref 0.2–1.2)
Total Protein: 7.5 g/dL (ref 6.0–8.3)

## 2020-03-14 LAB — LDL CHOLESTEROL, DIRECT: Direct LDL: 96 mg/dL

## 2020-03-14 LAB — HEMOGLOBIN A1C: Hgb A1c MFr Bld: 5.8 % (ref 4.6–6.5)

## 2020-03-14 MED ORDER — LISINOPRIL 10 MG PO TABS: ORAL_TABLET | ORAL | 1 refills | Status: DC

## 2020-03-14 MED ORDER — CLOPIDOGREL BISULFATE 75 MG PO TABS: 75.0000 mg | ORAL_TABLET | Freq: Every day | ORAL | 1 refills | Status: DC

## 2020-03-14 NOTE — Assessment & Plan Note (Signed)
Chronic Check a1c Low sugar / carb diet Stressed regular exercise  

## 2020-03-14 NOTE — Assessment & Plan Note (Signed)
Chronic Statin intolerant She does not want to consider trying Zetia Discussed that having high cholesterol does increase her risk of another stroke Check lipid panel

## 2020-03-14 NOTE — Assessment & Plan Note (Signed)
Chronic Taking Plavix, asked with 81 mg daily Statin intolerant and does not want to try Zetia BP elevated here, but she states it is controlled at home Reviewed risk factors for CVA and discussed importance of keeping them controlled CBC, CMP, lipids

## 2020-03-14 NOTE — Assessment & Plan Note (Signed)
Chronic Blood pressure slightly elevated here today, but is better controlled at home.  She does not check her blood pressure regularly, but when she does check it it is better-typically in the 130s Encouraged her to check her blood pressure regularly to make sure it is well controlled-discussed that her blood pressure is a risk factor for stroke CMP today

## 2020-03-14 NOTE — Assessment & Plan Note (Addendum)
Chronic, intermittent-typically only occurs with travel She elevates her feet when she is sitting during the day She denies any recent edema and has none on exam Continue Lasix 20 mg as needed

## 2020-03-14 NOTE — Assessment & Plan Note (Signed)
Chronic She states she is drinking plenty fluids Does not take any NSAIDs CMP today May need to discontinue lisinopril depending on GFR Advised monitoring BP at home to make sure it is well controlled

## 2020-03-14 NOTE — Assessment & Plan Note (Addendum)
Chronic Discussed that she is due for a DEXA and she does have an increased risk of having a fracture Defer DEXA Taking vitamin D daily, multivitamin No regular exercise

## 2020-03-15 ENCOUNTER — Other Ambulatory Visit: Payer: Self-pay | Admitting: Internal Medicine

## 2020-03-15 MED ORDER — HYDRALAZINE HCL 25 MG PO TABS: 25.0000 mg | ORAL_TABLET | Freq: Two times a day (BID) | ORAL | 5 refills | Status: DC

## 2020-09-07 ENCOUNTER — Telehealth: Payer: Self-pay | Admitting: Internal Medicine

## 2020-09-07 NOTE — Telephone Encounter (Signed)
LVM for pt to rtn my call to schedule AWV with [facility].  

## 2020-09-11 NOTE — Progress Notes (Signed)
Subjective:    Patient ID: Shirley Wells, female    DOB: 1941/02/23, 79 y.o.   MRN: [id number]  HPI The patient is here for follow up of their chronic medical problems, including htn, prediabetes, CKD stage 3, leg edema, h/o CVA, dyslipidemia, osteopenia.  Her daughter is with her.     She moved a picnic table in the beginning of the summer and since then she has had pain in her lower back and down her left buttock, sometimes down her upper left leg.  The pain has never gone past her knee.  Sometimes it feels like her left leg wants to give out.  The pain has improved.  She takes Tylenol, which does help.  She denies any numbness or tingling in the leg.    She has no other concerns.   Medications and allergies reviewed with patient and updated if appropriate.  Patient Active Problem List   Diagnosis Date Noted  . Hyperglycemia 02/03/2019  . Malignant neoplasm of upper-inner quadrant of left breast in female, estrogen receptor positive ([place]) 06/20/2017  . CKD (chronic kidney disease), stage III ([place]) 11/02/2016  . Osteopenia 04/30/2014  . Edema 09/26/2010  . PEPTIC ULCER DISEASE 03/21/2010  . ARTHRITIS 03/21/2010  . Dyslipidemia 03/20/2010  . Essential hypertension 03/20/2010  . History of CVA (cerebrovascular accident) 02/18/2010    Current Outpatient Medications on File Prior to Visit  Medication Sig Dispense Refill  . amLODipine (NORVASC) 5 MG tablet Take 1 tablet (5 mg total) by mouth daily. 90 tablet 3  . amoxicillin (AMOXIL) 500 MG tablet     . Ascorbic Acid (VITAMIN C) 1000 MG tablet Take 1,000 mg by mouth daily.    Marland Kitchen aspirin 81 MG tablet Take 81 mg by mouth 2 (two) times a week.     Marland Kitchen b complex vitamins tablet Take 1 tablet by mouth daily.    . Cholecalciferol (VITAMIN D3) 1000 UNIT/SPRAY LIQD Take 2 sprays by mouth daily.    . clopidogrel (PLAVIX) 75 MG tablet Take 1 tablet (75 mg total) by mouth daily. 90 tablet 1  . furosemide (LASIX) 20 MG tablet Take 20 mg by mouth  daily as needed for fluid.     . hydrALAZINE (APRESOLINE) 25 MG tablet Take 1 tablet (25 mg total) by mouth in the morning and at bedtime. 60 tablet 5  . lisinopril (ZESTRIL) 10 MG tablet     . loratadine (CLARITIN) 10 MG tablet Take 10 mg by mouth daily as needed for allergies.     . Menthol, Topical Analgesic, (BIOFREEZE EX) Apply 1 application topically daily as needed (muscle pain).    . Multiple Vitamin (MULTIVITAMIN) capsule Take 1 capsule by mouth daily.    Marland Kitchen SHARK CARTILAGE PO Take 100 mg by mouth daily.     No current facility-administered medications on file prior to visit.    Past Medical History:  Diagnosis Date  . CHICKENPOX, HX OF 03/21/2010   Qualifier: Diagnosis of  By: Marca Ancona RMA, Lucy    . 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 Dyslipidemia    intol of statin due to >3x increase LFTs  . GERD (gastroesophageal reflux disease)    hx  . Hypertension, essential   . Malignant neoplasm of upper-inner quadrant of left breast in female, estrogen receptor positive ([place]) 06/20/2017  . Osteoarthritis   . Stroke [facility])  Past Surgical History:  Procedure Laterality Date  . CHOLECYSTECTOMY  1981  . MASTECTOMY W/ SENTINEL NODE BIOPSY Left 07/30/2017   Procedure: LEFT TOTAL MASTECTOMY WITH AXILLARY SENTINEL LYMPH NODE BIOPSY;  Surgeon: Rolm Bookbinder, MD;  Location: [facility];  Service: General;  Laterality: Left;    Social History   Socioeconomic History  . Marital status: Divorced    Spouse name: Not on file  . Number of children: Not on file  . Years of education: Not on file  . Highest education level: Not on file  Occupational History  . Not on file  Tobacco Use  . Smoking status: Never Smoker  . Smokeless tobacco: Never Used  Substance and Sexual Activity  . Alcohol use: No    Alcohol/week: 0.0 standard drinks  . Drug use: No  . Sexual activity: Not on file  Other Topics  Concern  . Not on file  Social History Narrative   Divorced  >40 yrs, single and lives alone.    Supportive daughters, 1 is a Therapist, sports at [employer]).    Patient enjoys gardening, dancing and caring for her grand children.   Social Determinants of Health   Financial Resource Strain:   . Difficulty of Paying Living Expenses: Not on file  Food Insecurity:   . Worried About Charity fundraiser in the Last Year: Not on file  . Ran Out of Food in the Last Year: Not on file  Transportation Needs:   . Lack of Transportation (Medical): Not on file  . Lack of Transportation (Non-Medical): Not on file  Physical Activity:   . Days of Exercise per Week: Not on file  . Minutes of Exercise per Session: Not on file  Stress:   . Feeling of Stress : Not on file  Social Connections:   . Frequency of Communication with Friends and Family: Not on file  . Frequency of Social Gatherings with Friends and Family: Not on file  . Attends Religious Services: Not on file  . Active Member of Clubs or Organizations: Not on file  . Attends Archivist Meetings: Not on file  . Marital Status: Not on file    Family History  Problem Relation Age of Onset  . Breast cancer Mother 67  . Arthritis Other   . Breast cancer Maternal Aunt     Review of Systems  Constitutional: Negative for chills and fever.  Respiratory: Negative for cough, shortness of breath and wheezing.   Cardiovascular: Negative for chest pain, palpitations and leg swelling.  Genitourinary:       No change in urination  Neurological: Negative for light-headedness and headaches.       Objective:   Vitals:   09/12/20 1120  BP: 138/72  Pulse: 81  Temp: 98 F (36.7 C)  SpO2: 97%   BP Readings from Last 3 Encounters:  09/12/20 138/72  03/14/20 (!) 146/74  02/03/19 (!) 148/74   Wt Readings from Last 3 Encounters:  09/12/20 144 lb (65.3 kg)  03/14/20 156 lb (70.8 kg)  02/03/19 156 lb 12.8 oz (71.1 kg)   Body  mass index is 25.51 kg/m.   Physical Exam    Constitutional: Appears well-developed and well-nourished. No distress.  HENT:  Head: Normocephalic and atraumatic.  Neck: Neck supple. No tracheal deviation present. No thyromegaly present.  No cervical lymphadenopathy Cardiovascular: Normal rate, regular rhythm and normal heart sounds.   No murmur heard. No carotid bruit .  No edema Pulmonary/Chest: Effort normal  and breath sounds normal. No respiratory distress. No has no wheezes. No rales.  Skin: Skin is warm and dry. Not diaphoretic.  Psychiatric: Normal mood and affect. Behavior is normal.      Assessment & Plan:    See Problem List for Assessment and Plan of chronic medical problems.    This visit occurred during the SARS-CoV-2 public health emergency.  Safety protocols were in place, including screening questions prior to the visit, additional usage of staff PPE, and extensive cleaning of exam room while observing appropriate contact time as indicated for disinfecting solutions.

## 2020-09-12 ENCOUNTER — Ambulatory Visit: Payer: Medicare FFS | Admitting: Internal Medicine

## 2020-09-12 ENCOUNTER — Ambulatory Visit (INDEPENDENT_AMBULATORY_CARE_PROVIDER_SITE_OTHER)
Admission: RE | Admit: 2020-09-12 | Discharge: 2020-09-12 | Disposition: A | Discharge status: Home / Self Care | Payer: Medicare FFS | Source: Ambulatory Visit | Attending: Internal Medicine | Admitting: Internal Medicine

## 2020-09-12 ENCOUNTER — Encounter: Payer: Self-pay | Admitting: Internal Medicine

## 2020-09-12 ENCOUNTER — Other Ambulatory Visit: Payer: Self-pay

## 2020-09-12 VITALS — BP 138/72 | HR 81 | Temp 98.0°F | Ht 63.0 in | Wt 144.0 lb

## 2020-09-12 DIAGNOSIS — M85851 Other specified disorders of bone density and structure, right thigh: Secondary | ICD-10-CM

## 2020-09-12 DIAGNOSIS — Z170 Estrogen receptor positive status [ER+]: Secondary | ICD-10-CM

## 2020-09-12 DIAGNOSIS — Z8673 Personal history of transient ischemic attack (TIA), and cerebral infarction without residual deficits: Secondary | ICD-10-CM | POA: Diagnosis not present

## 2020-09-12 DIAGNOSIS — R600 Localized edema: Secondary | ICD-10-CM

## 2020-09-12 DIAGNOSIS — E785 Hyperlipidemia, unspecified: Secondary | ICD-10-CM | POA: Diagnosis not present

## 2020-09-12 DIAGNOSIS — I10 Essential (primary) hypertension: Secondary | ICD-10-CM

## 2020-09-12 DIAGNOSIS — M545 Low back pain, unspecified: Secondary | ICD-10-CM

## 2020-09-12 DIAGNOSIS — C50212 Malignant neoplasm of upper-inner quadrant of left female breast: Secondary | ICD-10-CM

## 2020-09-12 DIAGNOSIS — Z1231 Encounter for screening mammogram for malignant neoplasm of breast: Secondary | ICD-10-CM

## 2020-09-12 DIAGNOSIS — G8929 Other chronic pain: Secondary | ICD-10-CM

## 2020-09-12 DIAGNOSIS — N1832 Chronic kidney disease, stage 3b: Secondary | ICD-10-CM

## 2020-09-12 DIAGNOSIS — R739 Hyperglycemia, unspecified: Secondary | ICD-10-CM

## 2020-09-12 LAB — COMPREHENSIVE METABOLIC PANEL
ALT: 33 U/L (ref 0–35)
AST: 46 U/L — ABNORMAL HIGH (ref 0–37)
Albumin: 3.9 g/dL (ref 3.5–5.2)
Alkaline Phosphatase: 384 U/L — ABNORMAL HIGH (ref 39–117)
BUN: 25 mg/dL — ABNORMAL HIGH (ref 6–23)
CO2: 25 mEq/L (ref 19–32)
Calcium: 11 mg/dL — ABNORMAL HIGH (ref 8.4–10.5)
Chloride: 108 mEq/L (ref 96–112)
Creatinine, Ser: 1.74 mg/dL — ABNORMAL HIGH (ref 0.40–1.20)
GFR: 27.18 mL/min — ABNORMAL LOW (ref >60.00)
Glucose, Bld: 88 mg/dL (ref 70–99)
Potassium: 5.3 mEq/L — ABNORMAL HIGH (ref 3.5–5.1)
Sodium: 142 mEq/L (ref 135–145)
Total Bilirubin: 0.4 mg/dL (ref 0.2–1.2)
Total Protein: 7.6 g/dL (ref 6.0–8.3)

## 2020-09-12 LAB — CBC WITH DIFFERENTIAL/PLATELET
Basophils Absolute: 0.1 10*3/uL (ref 0.0–0.1)
Basophils Relative: 1.4 % (ref 0.0–3.0)
Eosinophils Absolute: 0.3 10*3/uL (ref 0.0–0.7)
Eosinophils Relative: 3.2 % (ref 0.0–5.0)
HCT: 35.1 % — ABNORMAL LOW (ref 36.0–46.0)
Hemoglobin: 11.7 g/dL — ABNORMAL LOW (ref 12.0–15.0)
Lymphocytes Relative: 35 % (ref 12.0–46.0)
Lymphs Abs: 3.2 10*3/uL (ref 0.7–4.0)
MCHC: 33.3 g/dL (ref 30.0–36.0)
MCV: 100.1 fl — ABNORMAL HIGH (ref 78.0–100.0)
Monocytes Absolute: 0.7 10*3/uL (ref 0.1–1.0)
Monocytes Relative: 7.2 % (ref 3.0–12.0)
Neutro Abs: 4.9 10*3/uL (ref 1.4–7.7)
Neutrophils Relative %: 53.2 % (ref 43.0–77.0)
Platelets: 279 10*3/uL (ref 150.0–400.0)
RBC: 3.51 Mil/uL — ABNORMAL LOW (ref 3.87–5.11)
RDW: 14.6 % (ref 11.5–15.5)
WBC: 9.2 10*3/uL (ref 4.0–10.5)

## 2020-09-12 LAB — LIPID PANEL
Cholesterol: 304 mg/dL — ABNORMAL HIGH (ref 0–200)
HDL: 47.7 mg/dL (ref >39.00)
Total CHOL/HDL Ratio: 6
Triglycerides: 462 mg/dL — ABNORMAL HIGH (ref 0.0–149.0)

## 2020-09-12 LAB — LDL CHOLESTEROL, DIRECT: Direct LDL: 58 mg/dL

## 2020-09-12 LAB — VITAMIN D 25 HYDROXY (VIT D DEFICIENCY, FRACTURES): VITD: 42.6 ng/mL (ref 30.00–100.00)

## 2020-09-12 LAB — HEMOGLOBIN A1C: Hgb A1c MFr Bld: 6.4 % (ref 4.6–6.5)

## 2020-09-12 NOTE — Assessment & Plan Note (Signed)
Chronic Does not take any NSAIDs-only Tylenol She is drinking plenty of water She has been taking "a kidney vitamin" Has not wanted to see nephrology and defers referral at this time CMP-if kidney function is the same or worse I will order referral

## 2020-09-12 NOTE — Assessment & Plan Note (Signed)
Chronic, intermittent Typically only gets edema with travel Has not needed Lasix in a while, but will continue 20 mg of Lasix as needed CMP

## 2020-09-12 NOTE — Assessment & Plan Note (Signed)
Chronic Check lipid panel  Has refused cholesterol-lowering medications and did not tolerate statins in the past Regular exercise and healthy diet encouraged

## 2020-09-12 NOTE — Assessment & Plan Note (Signed)
Chronic DEXA due-scheduled Taking vitamin D and multivitamin-unsure how much We will check vitamin D level Encouraged regular exercise

## 2020-09-12 NOTE — Assessment & Plan Note (Signed)
New problem-somewhat chronic since it started several months ago Pain across lower back radiating into left buttock and upper leg X-ray lumbar spine today Referred to physical therapy If no improvement she may need to see orthopedics

## 2020-09-12 NOTE — Assessment & Plan Note (Signed)
Chronic Blood pressure today well controlled Continue amlodipine 5 mg daily, hydralazine 25 mg twice daily and lisinopril 10 mg daily CMP

## 2020-09-12 NOTE — Assessment & Plan Note (Signed)
Chronic States she is compliant with a low sugar/carb diet We will check A1c Encouraged regular exercise

## 2020-09-12 NOTE — Assessment & Plan Note (Signed)
History of left-sided breast cancer status postmastectomy Has not had a mammogram and is not currently following with oncology Mammogram of right breast ordered

## 2020-09-12 NOTE — Assessment & Plan Note (Signed)
Chronic Continue aspirin 81 mg twice a week and Plavix 75 mg daily Not on a statin-intolerant and deferred other cholesterol-lowering medications Blood pressure currently controlled

## 2020-09-12 NOTE — Patient Instructions (Addendum)
  Blood work and a lower back xray was ordered.     Medications reviewed and updated.  Changes include :   none   A referral was ordered for  Physical therapy.      Someone from their office will call you to schedule an appointment.   A mammogram and bone density was ordered.    Please followup in 6 months

## 2020-09-14 ENCOUNTER — Telehealth: Payer: Self-pay | Admitting: Internal Medicine

## 2020-09-14 ENCOUNTER — Encounter: Payer: Self-pay | Admitting: Internal Medicine

## 2020-09-14 DIAGNOSIS — N184 Chronic kidney disease, stage 4 (severe): Secondary | ICD-10-CM

## 2020-09-14 DIAGNOSIS — S32020A Wedge compression fracture of second lumbar vertebra, initial encounter for closed fracture: Secondary | ICD-10-CM | POA: Insufficient documentation

## 2020-09-14 NOTE — Telephone Encounter (Signed)
Spoke with patient and info given.  She has gotten a call from the orthopedic office she said but states she couldn't see them until after November 1st.  She said she is feeling fine and will hold off for the referral for now but will call them if she changes her mind.

## 2020-09-14 NOTE — Telephone Encounter (Signed)
Call her with her results -   lower back xray shows a fracture or broken bone.  This is likely what is causing her pain.  I can have her see an orthopedic - sometimes there is a procedure they can do to help improve the pain.  It has already healed for the most part so they may not be able to do much.   I did order a bone density when she was here - she should have that done  - she is at risk for other broken bones and we can discuss treatment options after the bone density results are back.   Her kidney function is about the same.  I did order a referral to a kidney specialist - they will call her to schedule.  She needs to see them.   Cholesterol is ok.  Sugars are in prediabetic range but she is very close to being a diabetic.  Vitamin d level is good.  She has mild anemia.

## 2020-10-12 ENCOUNTER — Other Ambulatory Visit: Payer: Self-pay | Admitting: Internal Medicine

## 2020-10-12 ENCOUNTER — Other Ambulatory Visit (HOSPITAL_COMMUNITY): Payer: Self-pay | Admitting: Internal Medicine

## 2020-10-12 DIAGNOSIS — R7989 Other specified abnormal findings of blood chemistry: Secondary | ICD-10-CM

## 2020-10-12 DIAGNOSIS — R945 Abnormal results of liver function studies: Secondary | ICD-10-CM

## 2020-10-24 ENCOUNTER — Ambulatory Visit (HOSPITAL_COMMUNITY)
Admission: RE | Admit: 2020-10-24 | Discharge: 2020-10-24 | Disposition: A | Discharge status: Home / Self Care | Payer: Medicare FFS | Source: Ambulatory Visit | Attending: Internal Medicine | Admitting: Internal Medicine

## 2020-10-24 ENCOUNTER — Other Ambulatory Visit: Payer: Self-pay

## 2020-10-24 DIAGNOSIS — R945 Abnormal results of liver function studies: Secondary | ICD-10-CM | POA: Insufficient documentation

## 2020-10-24 DIAGNOSIS — R7989 Other specified abnormal findings of blood chemistry: Secondary | ICD-10-CM

## 2020-10-31 ENCOUNTER — Telehealth: Payer: Self-pay | Admitting: Internal Medicine

## 2020-10-31 NOTE — Telephone Encounter (Signed)
Patient called and was wondering if Dr.Burns could give her a call. She has some questions about her recent tests. She can be reached at [phone]

## 2020-11-01 NOTE — Telephone Encounter (Signed)
Ok to schedule phone call - I just want to make sure I have her most recent tests = these may be the ones from the kidney doctor and I do not think I have all of the tests

## 2020-11-01 NOTE — Telephone Encounter (Signed)
Message left for patient to return call to clinic regarding her questions. If she calls back please see what questions she had specifically so we can get them addressed.

## 2020-11-02 NOTE — Telephone Encounter (Signed)
Spoke with patient and sent over request to Santiago Bumpers with [facility] to see if they could send over labs and test results. She understands when they come in I will give to Dr. Quay Burow for review and call her to set up telephone call.

## 2020-11-07 ENCOUNTER — Telehealth: Payer: Self-pay | Admitting: Internal Medicine

## 2020-11-07 DIAGNOSIS — R748 Abnormal levels of other serum enzymes: Secondary | ICD-10-CM | POA: Insufficient documentation

## 2020-11-07 NOTE — Progress Notes (Signed)
Virtual Visit via telephone Note  I connected with Shirley Wells on 11/07/20 at 10:45 AM EST by telephone and verified that I am speaking with the correct person using two identifiers.   I discussed the limitations of evaluation and management by telemedicine and the availability of in person appointments. The patient expressed understanding and agreed to proceed.  Present for the visit:  Myself, Dr Billey Gosling, June Leap.  The patient is currently at home and I am in the office.    No referring provider.    History of Present Illness: She is here for follow up.  She did see nephrology and had an [place] of her abdomen.  She wanted to know how her [place] was.    She is still having lower back pain - it is worse than what it was before.  She did not see ortho.  She is taking tylenol which helps minimally  No radiating pain into legs.  Pain in lower back.     Discussed - Stage 4 kidney disease. Elevated Ca, alk phos   She denies fever, CP, SOB, HA and dizziness.  No radiation of pain or N/T.  No leg weakness.    Sitting more due to back pain.    Social History   Socioeconomic History  . Marital status: Divorced    Spouse name: Not on file  . Number of children: Not on file  . Years of education: Not on file  . Highest education level: Not on file  Occupational History  . Not on file  Tobacco Use  . Smoking status: Never Smoker  . Smokeless tobacco: Never Used  Substance and Sexual Activity  . Alcohol use: No    Alcohol/week: 0.0 standard drinks  . Drug use: No  . Sexual activity: Not on file  Other Topics Concern  . Not on file  Social History Narrative   Divorced  >40 yrs, single and lives alone.    Supportive daughters, 1 is a Therapist, sports at [employer]).    Patient enjoys gardening, dancing and caring for her grand children.   Social Determinants of Health   Financial Resource Strain: Not on file  Food Insecurity: Not on file  Transportation Needs: Not on file   Physical Activity: Not on file  Stress: Not on file  Social Connections: Not on file     Observations/Objective: Appears well in NAD    US Abdomen Complete CLINICAL DATA:  Elevated liver function tests. Prior cholecystectomy. Chronic kidney disease.  EXAM: ABDOMEN ULTRASOUND COMPLETE  COMPARISON:  06/20/2010 from [place]: Gallbladder: Surgically absent.  Common bile duct: Diameter: 7 mm, within normal limits post cholecystectomy.  Liver: Diffusely increased echogenicity of the hepatic parenchyma is similar to prior exam, consistent with hepatic steatosis. No hepatic mass identified. Portal vein is patent on color Doppler imaging with normal direction of blood flow towards the liver.  IVC: No abnormality visualized.  Pancreas: Visualized portion unremarkable.  Spleen: Size and appearance within normal limits.  Right Kidney: Length: 7.9 cm. Echogenicity within normal limits. Diffuse renal parenchymal thinning noted. No mass or hydronephrosis visualized.  Left Kidney: Length: 9.0 cm. Echogenicity within normal limits. Two small cysts are noted which measure 2.6 cm in 2.5 cm in maximum diameter. No mass or hydronephrosis visualized.  Abdominal aorta: No aneurysm visualized.  Other findings: None.  IMPRESSION: Prior cholecystectomy. No evidence of biliary ductal dilatation or other acute findings.  Diffuse hepatic steatosis.  Small kidneys, consistent with  chronic medical renal disease. No evidence of hydronephrosis.  Electronically Signed   By: Marlaine Hind M.D.   On: 10/24/2020 13:46    Lab Results  Component Value Date   WBC 9.2 09/12/2020   HGB 11.7 (L) 09/12/2020   HCT 35.1 (L) 09/12/2020   PLT 279.0 09/12/2020   GLUCOSE 88 09/12/2020   CHOL 304 (H) 09/12/2020   TRIG (H) 09/12/2020    462.0 Triglyceride is over 400; calculations on Lipids are invalid.   HDL 47.70 09/12/2020   LDLDIRECT 58.0 09/12/2020   LDLCALC 232 (H)  04/30/2014   ALT 33 09/12/2020   AST 46 (H) 09/12/2020   NA 142 09/12/2020   K 5.3 No hemolysis seen (H) 09/12/2020   CL 108 09/12/2020   CREATININE 1.74 (H) 09/12/2020   BUN 25 (H) 09/12/2020   CO2 25 09/12/2020   TSH 1.67 11/02/2016   INR 0.95 07/30/2017   HGBA1C 6.4 09/12/2020     Assessment and Plan:  See Problem List for Assessment and Plan of chronic medical problems.   Follow Up Instructions:    I discussed the assessment and treatment plan with the patient. The patient was provided an opportunity to ask questions and all were answered. The patient agreed with the plan and demonstrated an understanding of the instructions.   The patient was advised to call back or seek an in-person evaluation if the symptoms worsen or if the condition fails to improve as anticipated.  Time spent on telephone call - 12 minutes  Binnie Rail, MD

## 2020-11-07 NOTE — Telephone Encounter (Signed)
Patient is still having back pain. Has been really bad over the last few days  Please call the patient at [phone]

## 2020-11-07 NOTE — Telephone Encounter (Signed)
Spoke with patient. She preferred telephone call for tomorrow.

## 2020-11-07 NOTE — Telephone Encounter (Signed)
I have not seen anything from [facility].  He did order an abdominal ultrasound and I do see those results.  It may be best if she makes an appointment.

## 2020-11-08 ENCOUNTER — Telehealth (INDEPENDENT_AMBULATORY_CARE_PROVIDER_SITE_OTHER): Payer: Medicare FFS | Admitting: Internal Medicine

## 2020-11-08 ENCOUNTER — Other Ambulatory Visit: Payer: Self-pay

## 2020-11-08 ENCOUNTER — Encounter: Payer: Self-pay | Admitting: Internal Medicine

## 2020-11-08 DIAGNOSIS — N184 Chronic kidney disease, stage 4 (severe): Secondary | ICD-10-CM

## 2020-11-08 DIAGNOSIS — S32020D Wedge compression fracture of second lumbar vertebra, subsequent encounter for fracture with routine healing: Secondary | ICD-10-CM | POA: Diagnosis not present

## 2020-11-08 DIAGNOSIS — R748 Abnormal levels of other serum enzymes: Secondary | ICD-10-CM

## 2020-11-08 NOTE — Assessment & Plan Note (Signed)
Check labs - cbc, cmp, spep, upep, vitamin d level, pth

## 2020-11-08 NOTE — Assessment & Plan Note (Signed)
Chronic Following with nephrology cmp 

## 2020-11-08 NOTE — Assessment & Plan Note (Signed)
Chronic Has xray 08/2020- L 2 compression fracture - not acute Had pain - but pain is getting worse I had advised to see ortho, but she has not seen anyone Continue tylenol Will get MRI ? Alk phos elevated, Ca elevated Check labs - cbc, cmp, spep, upep, vitamin d level, pth Will need to see ortho - will get above first

## 2020-11-09 ENCOUNTER — Telehealth: Payer: Self-pay | Admitting: Internal Medicine

## 2020-11-09 NOTE — Telephone Encounter (Signed)
Spoke with patient today. 

## 2020-11-09 NOTE — Telephone Encounter (Signed)
Can you call her - when she goes for her MRI  - have her also go to the [facility] for bloodwork (ordered).  Thank you.

## 2020-11-11 ENCOUNTER — Other Ambulatory Visit (INDEPENDENT_AMBULATORY_CARE_PROVIDER_SITE_OTHER): Payer: Medicare FFS

## 2020-11-11 ENCOUNTER — Ambulatory Visit
Admission: RE | Admit: 2020-11-11 | Discharge: 2020-11-11 | Disposition: A | Discharge status: Home / Self Care | Payer: Medicare FFS | Source: Ambulatory Visit | Attending: Internal Medicine | Admitting: Internal Medicine

## 2020-11-11 ENCOUNTER — Other Ambulatory Visit: Payer: Self-pay

## 2020-11-11 ENCOUNTER — Telehealth: Payer: Self-pay | Admitting: Internal Medicine

## 2020-11-11 DIAGNOSIS — S32020D Wedge compression fracture of second lumbar vertebra, subsequent encounter for fracture with routine healing: Secondary | ICD-10-CM | POA: Diagnosis not present

## 2020-11-11 DIAGNOSIS — N184 Chronic kidney disease, stage 4 (severe): Secondary | ICD-10-CM | POA: Diagnosis not present

## 2020-11-11 DIAGNOSIS — R748 Abnormal levels of other serum enzymes: Secondary | ICD-10-CM | POA: Diagnosis not present

## 2020-11-11 LAB — VITAMIN D 25 HYDROXY (VIT D DEFICIENCY, FRACTURES): VITD: 59.58 ng/mL (ref 30.00–100.00)

## 2020-11-11 LAB — CBC WITH DIFFERENTIAL/PLATELET
Basophils Absolute: 0.1 10*3/uL (ref 0.0–0.1)
Basophils Relative: 1.3 % (ref 0.0–3.0)
Eosinophils Absolute: 0.2 10*3/uL (ref 0.0–0.7)
Eosinophils Relative: 3.3 % (ref 0.0–5.0)
HCT: 30.8 % — ABNORMAL LOW (ref 36.0–46.0)
Hemoglobin: 10.3 g/dL — ABNORMAL LOW (ref 12.0–15.0)
Lymphocytes Relative: 31.2 % (ref 12.0–46.0)
Lymphs Abs: 2.2 10*3/uL (ref 0.7–4.0)
MCHC: 33.5 g/dL (ref 30.0–36.0)
MCV: 98.9 fl (ref 78.0–100.0)
Monocytes Absolute: 0.5 10*3/uL (ref 0.1–1.0)
Monocytes Relative: 6.8 % (ref 3.0–12.0)
Neutro Abs: 4 10*3/uL (ref 1.4–7.7)
Neutrophils Relative %: 57.4 % (ref 43.0–77.0)
Platelets: 298 10*3/uL (ref 150.0–400.0)
RBC: 3.11 Mil/uL — ABNORMAL LOW (ref 3.87–5.11)
RDW: 15.5 % (ref 11.5–15.5)
WBC: 7 10*3/uL (ref 4.0–10.5)

## 2020-11-11 LAB — COMPREHENSIVE METABOLIC PANEL
ALT: 21 U/L (ref 0–35)
AST: 32 U/L (ref 0–37)
Albumin: 3.8 g/dL (ref 3.5–5.2)
Alkaline Phosphatase: 397 U/L — ABNORMAL HIGH (ref 39–117)
BUN: 49 mg/dL — ABNORMAL HIGH (ref 6–23)
CO2: 22 mEq/L (ref 19–32)
Calcium: 11 mg/dL — ABNORMAL HIGH (ref 8.4–10.5)
Chloride: 109 mEq/L (ref 96–112)
Creatinine, Ser: 1.67 mg/dL — ABNORMAL HIGH (ref 0.40–1.20)
GFR: 28.79 mL/min — ABNORMAL LOW (ref >60.00)
Glucose, Bld: 98 mg/dL (ref 70–99)
Potassium: 4.7 mEq/L (ref 3.5–5.1)
Sodium: 140 mEq/L (ref 135–145)
Total Bilirubin: 0.3 mg/dL (ref 0.2–1.2)
Total Protein: 7.6 g/dL (ref 6.0–8.3)

## 2020-11-11 MED ORDER — HYDROCODONE-ACETAMINOPHEN 5-325 MG PO TABS: 1.0000 | ORAL_TABLET | ORAL | 0 refills | Status: DC | PRN

## 2020-11-11 NOTE — Telephone Encounter (Signed)
Pain medication sent into [facility].  She had an MRI today that unfortunately showed metastatic disease which she is not aware of.  I did try to call her home phone, cell phone and her daughter cell phone and it was not able to get in touch with anyone.  I did leave a message on her home phone to call the office on Monday morning to discuss the results

## 2020-11-11 NOTE — Telephone Encounter (Signed)
   Patient requesting pain medication for back pain Pharmacy:[id number] - [place], [address] AT [address]

## 2020-11-14 ENCOUNTER — Other Ambulatory Visit: Payer: Self-pay | Admitting: Internal Medicine

## 2020-11-14 ENCOUNTER — Telehealth: Payer: Self-pay | Admitting: Nurse Practitioner

## 2020-11-14 DIAGNOSIS — C7951 Secondary malignant neoplasm of bone: Secondary | ICD-10-CM

## 2020-11-14 NOTE — Telephone Encounter (Signed)
   Please return call to daughter Hulen Luster at [phone] to discuss MRI

## 2020-11-14 NOTE — Telephone Encounter (Signed)
Received a referral from Dr. Quay Burow for [name] to re-establish care for metastatic breast cancer. [name] has been scheduled to see Shirley Wells on 12/22 at 145pm. Appt date and time has been given to the pt's daughter. Pt aware to arrive 30 minutes early.

## 2020-11-15 LAB — PROTEIN ELECTROPHORESIS, URINE REFLEX
Albumin ELP, Urine: 16.3 %
Alpha-1-Globulin, U: 3.1 %
Alpha-2-Globulin, U: 18 %
Beta Globulin, U: 33.4 %
Gamma Globulin, U: 29.2 %
Protein, Ur: 11.1 mg/dL

## 2020-11-15 LAB — PTH, INTACT AND CALCIUM
Calcium: 10.4 mg/dL (ref 8.6–10.4)
PTH: 7 pg/mL — ABNORMAL LOW (ref 14–64)

## 2020-11-16 ENCOUNTER — Other Ambulatory Visit: Payer: Self-pay

## 2020-11-16 ENCOUNTER — Encounter: Payer: Self-pay | Admitting: Nurse Practitioner

## 2020-11-16 ENCOUNTER — Inpatient Hospital Stay: Payer: Medicare FFS | Attending: Nurse Practitioner | Admitting: Nurse Practitioner

## 2020-11-16 VITALS — BP 159/77 | HR 84 | Temp 98.0°F | Resp 17 | Ht 63.0 in | Wt 139.5 lb

## 2020-11-16 DIAGNOSIS — Z170 Estrogen receptor positive status [ER+]: Secondary | ICD-10-CM | POA: Diagnosis not present

## 2020-11-16 DIAGNOSIS — R42 Dizziness and giddiness: Secondary | ICD-10-CM | POA: Insufficient documentation

## 2020-11-16 DIAGNOSIS — E785 Hyperlipidemia, unspecified: Secondary | ICD-10-CM | POA: Diagnosis not present

## 2020-11-16 DIAGNOSIS — R112 Nausea with vomiting, unspecified: Secondary | ICD-10-CM | POA: Diagnosis not present

## 2020-11-16 DIAGNOSIS — K219 Gastro-esophageal reflux disease without esophagitis: Secondary | ICD-10-CM | POA: Insufficient documentation

## 2020-11-16 DIAGNOSIS — C50212 Malignant neoplasm of upper-inner quadrant of left female breast: Secondary | ICD-10-CM | POA: Insufficient documentation

## 2020-11-16 DIAGNOSIS — R519 Headache, unspecified: Secondary | ICD-10-CM | POA: Diagnosis not present

## 2020-11-16 DIAGNOSIS — Z7982 Long term (current) use of aspirin: Secondary | ICD-10-CM | POA: Insufficient documentation

## 2020-11-16 DIAGNOSIS — Z9012 Acquired absence of left breast and nipple: Secondary | ICD-10-CM | POA: Insufficient documentation

## 2020-11-16 DIAGNOSIS — M545 Low back pain, unspecified: Secondary | ICD-10-CM | POA: Insufficient documentation

## 2020-11-16 DIAGNOSIS — Z8673 Personal history of transient ischemic attack (TIA), and cerebral infarction without residual deficits: Secondary | ICD-10-CM | POA: Insufficient documentation

## 2020-11-16 DIAGNOSIS — N189 Chronic kidney disease, unspecified: Secondary | ICD-10-CM | POA: Insufficient documentation

## 2020-11-16 DIAGNOSIS — I129 Hypertensive chronic kidney disease with stage 1 through stage 4 chronic kidney disease, or unspecified chronic kidney disease: Secondary | ICD-10-CM | POA: Diagnosis not present

## 2020-11-16 DIAGNOSIS — Z7902 Long term (current) use of antithrombotics/antiplatelets: Secondary | ICD-10-CM | POA: Insufficient documentation

## 2020-11-16 DIAGNOSIS — R531 Weakness: Secondary | ICD-10-CM | POA: Diagnosis not present

## 2020-11-16 DIAGNOSIS — R630 Anorexia: Secondary | ICD-10-CM | POA: Insufficient documentation

## 2020-11-16 DIAGNOSIS — Z79899 Other long term (current) drug therapy: Secondary | ICD-10-CM | POA: Insufficient documentation

## 2020-11-16 DIAGNOSIS — M1990 Unspecified osteoarthritis, unspecified site: Secondary | ICD-10-CM | POA: Diagnosis not present

## 2020-11-16 DIAGNOSIS — M8580 Other specified disorders of bone density and structure, unspecified site: Secondary | ICD-10-CM | POA: Diagnosis not present

## 2020-11-16 DIAGNOSIS — R269 Unspecified abnormalities of gait and mobility: Secondary | ICD-10-CM | POA: Insufficient documentation

## 2020-11-16 DIAGNOSIS — R634 Abnormal weight loss: Secondary | ICD-10-CM | POA: Diagnosis not present

## 2020-11-16 DIAGNOSIS — M25552 Pain in left hip: Secondary | ICD-10-CM | POA: Insufficient documentation

## 2020-11-16 MED ORDER — MIRTAZAPINE 7.5 MG PO TABS: 7.5000 mg | ORAL_TABLET | Freq: Every day | ORAL | 3 refills | Status: DC

## 2020-11-16 MED ORDER — ONDANSETRON HCL 8 MG PO TABS: 8.0000 mg | ORAL_TABLET | Freq: Three times a day (TID) | ORAL | 3 refills | Status: DC | PRN

## 2020-11-16 NOTE — Progress Notes (Signed)
[place]   [phone] [phone]   Clinic Follow up Note   Patient Care Team: Binnie Rail, MD as PCP - General (Internal Medicine) Garvin Fila, MD as Consulting Physician (Neurology) Rolm Bookbinder, MD as Consulting Physician (General Surgery) Truitt Merle, MD as Consulting Physician (Hematology) Kyung Rudd, MD as Consulting Physician (Radiation Oncology) 11/16/2020  CHIEF COMPLAINT: Referred back for possible metastatic breast cancer (Dr. Billey Gosling, PCP)  SUMMARY OF ONCOLOGIC HISTORY: Oncology History Overview Note  Cancer Staging Malignant neoplasm of upper-inner quadrant of left breast in female, estrogen receptor positive ([place]) Staging form: Breast, AJCC 8th Edition - Clinical stage from 06/18/2017: Stage IB (cT2, cN0, cM0, G2, ER: Positive, PR: Positive, HER2: Negative) - Signed by Truitt Merle, MD on 06/24/2017 - Pathologic stage from 07/30/2017: Stage IA (pT2, pN0, cM0, G2, ER: Positive, PR: Positive, HER2: Negative, Oncotype DX score: 23) - Signed by Truitt Merle, MD on 08/23/2017     Malignant neoplasm of upper-inner quadrant of left breast in female, estrogen receptor positive ([place])  06/17/2017 Mammogram   [place] and MM diagnostic Breast Tomo Bilateral  IMPRESSION: 1. 3.4 palpable mass in the 10 o'clock position of the left breast is highly suspicious for malignancy. 2. No evidence of malignancy in the right breast. 3. Ultrasound of the left axilla shows normal sized axillary lymph nodes.   06/18/2017 Initial Biopsy   Diagnosis 06/18/17 Breast, left, needle core biopsy, 10:00 o'clock - INVASIVE MAMMARY CARCINOMA. At least G2  The malignant cells are negative for E-Cadherin, supporting a lobular phenotype.   06/18/2017 Initial Diagnosis   Malignant neoplasm of upper-inner quadrant of left breast in female, estrogen receptor positive ([place])   06/18/2017 Receptors her2   ER 95%+, PR 25%+, both strong staining  HER2- Ki67 30%     07/30/2017 Surgery   LEFT TOTAL MASTECTOMY WITH AXILLARY SENTINEL LYMPH NODE BIOPSY by Dr. Donne Hazel on 07/30/17   07/30/2017 Pathology Results   Diagnosis 07/30/17 1. Breast, simple mastectomy, Left Total - INVASIVE LOBULAR CARCINOMA, GRADE 2, SPANNING 3.5 CM. - LOBULAR CARCINOMA IN SITU. - INVASIVE CARCINOMA IS BROADLY 0.1 CM FROM POSTERIOR MARGIN. - PERINEURAL INVASION PRESENT. - SEE ONCOLOGY TABLE. 2. Lymph node, sentinel, biopsy, Left Axillary - ONE OF ONE LYMPH NODES NEGATIVE FOR CARCINOMA (0/1). 3. Lymph node, sentinel, biopsy, Left - ONE OF ONE LYMPH NODES NEGATIVE FOR CARCINOMA (0/1).   07/30/2017 Oncotype testing   Oncotype 07/30/17 Recurrence score is 23, an intermediate risk With a 10-year risk of recurrence at 15% with tamoxifen alone.       INTERVAL HISTORY: Ms. Skilling has been referred back to [place] by PCP.  She was initially seen by Dr. 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  She underwent left mastectomy by Dr. Donne Hazel, final path showed pT2N0 stage Ib, ER/PR positive, HER-2 negative, grade 2 lobular breast cancer.  Oncotype showed intermediate risk which predicts 10-year distant recurrence after 5 years of tamoxifen at 15%.  The benefit of chemotherapy in the intermediate risk group was felt to be small and controversial, adjuvant chemotherapy was not recommended.  She did not require postmastectomy radiation.  Her bone density scan showed osteopenia with high risk of hip fracture 93.7% in 10 years and she was recommended to begin tamoxifen for adjuvant antiestrogen therapy in 08/2017.  She reports not liking the way it made her feel and was fearful of side effect profile, specifically risk of DVT and endometrial  cancer, and she did not take it.  Patient canceled an appointment on 11/21/2017 due to an illness in the family was lost to follow-up after that.  More recently she developed low back pain with sciatica after lifting a  picnic table in summer 2021 which she brought to the attention of her PCP Dr. Billey Gosling in 08/2020 during a wellness visit.  Lumbar spine x-ray on 09/12/2020 showed age-indeterminate mild L2 compression fracture and degenerative change at L5-S1.  She had a repeat DEXA same day that showed T score -1.5 at the right femur neck -1.4 at the left femur neck.  Her risk of a major fracture was calculated at 13.9% and 3.4% for hip fracture.  Labs from the 08/2020 wellness visit showed worsening kidney function SCR 1.74, AST elevation of 46, progressively higher alk phos now 384 (began rising in 01/2018) and new hypercalcemia CA 11.0 with a normal albumin.  An abdominal ultrasound was obtained on 10/24/2020 that showed diffusely increased echogenicity of the liver consistent with hepatic steatosis, no mass, and no renal mass or hydronephrosis.  Due to worsening low back pain with radiation to legs she underwent lumbar MRI without contrast that showed diffuse osseous metastases, likely late subacute pathologic fracture involving L1 and L3, and mild to severe spinal canal and neural for minimal narrowing from L3-S1.  She was referred back to medical oncology.  Today, she presents in wheelchair accompanied by her daughter.  She continues to live independently, able to do all ADLs and activities except drive.  She feels weak with anorexia and weight loss close to 20 pounds in 6 months.  She has mild dizziness and nausea in the morning, no significant vomiting.  She has had a couple mild headaches, no vision changes.  She has persistent low back pain that radiates around her rib cage, worse with ambulation.  Alternates Norco and Tylenol Extra Strength which is somewhat effective.  Pain in the left hip/groin causes her to limp.  Denies cough, chest pain, dyspnea, leg edema.     REVIEW OF SYSTEMS:   Constitutional: Denies fevers, chills or (+) anorexia (+) abnormal weight loss nearly 20 pounds in 6 months Eyes: Denies  blurriness of vision Ears, nose, mouth, throat, and face: Denies mucositis or sore throat Respiratory: Denies cough, dyspnea or wheezes Cardiovascular: Denies palpitation, chest discomfort or lower extremity swelling Gastrointestinal:  Denies vomiting, constipation, diarrhea, heartburn or change in bowel habits (+) nausea Skin: Denies abnormal skin rashes Lymphatics: Denies new lymphadenopathy or easy bruising Neurological:Denies numbness, tingling or new weaknesses (+) dizziness (+) weakness (+) headache MSK: (+) Low back pain radiates to rib cage and down legs (+) left groin pain Behavioral/Psych: Mood is stable, no new changes  All other systems were reviewed with the patient and are negative.  MEDICAL HISTORY:  Past Medical History:  Diagnosis Date  . CHICKENPOX, HX OF 03/21/2010   Qualifier: Diagnosis of  By: Marca Ancona RMA, Lucy    . 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 Dyslipidemia    intol of statin due to >3x increase LFTs  . GERD (gastroesophageal reflux disease)    hx  . Hypertension, essential   . Malignant neoplasm of upper-inner quadrant of left breast in female, estrogen receptor positive ([place]) 06/20/2017  . Osteoarthritis   . Stroke [facility])     SURGICAL HISTORY: Past Surgical History:  Procedure Laterality Date  . CHOLECYSTECTOMY  1981  . MASTECTOMY W/ SENTINEL NODE BIOPSY Left 07/30/2017   Procedure: LEFT TOTAL MASTECTOMY WITH AXILLARY SENTINEL LYMPH NODE BIOPSY;  Surgeon: Rolm Bookbinder, MD;  Location: [facility];  Service: General;  Laterality: Left;    I have reviewed the social history and family history with the patient and they are unchanged from previous note.  ALLERGIES:  is allergic to influenza vaccines, pneumococcal vaccines, simvastatin, tetanus toxoids, and zoster vaccine live.  MEDICATIONS:  Current Outpatient Medications  Medication Sig Dispense Refill  . amLODipine  (NORVASC) 5 MG tablet Take 1 tablet (5 mg total) by mouth daily. 90 tablet 3  . Ascorbic Acid (VITAMIN C) 1000 MG tablet Take 1,000 mg by mouth daily.    . Cholecalciferol (VITAMIN D3) 1000 UNIT/SPRAY LIQD Take 2 sprays by mouth daily.    . clopidogrel (PLAVIX) 75 MG tablet Take 1 tablet (75 mg total) by mouth daily. 90 tablet 1  . hydrALAZINE (APRESOLINE) 25 MG tablet Take 1 tablet (25 mg total) by mouth in the morning and at bedtime. 60 tablet 5  . HYDROcodone-acetaminophen (NORCO/VICODIN) 5-325 MG tablet Take 1-2 tablets by mouth every 4 (four) hours as needed for severe pain. 60 tablet 0  . amoxicillin (AMOXIL) 500 MG tablet     . aspirin 81 MG tablet Take 81 mg by mouth 2 (two) times a week.     Marland Kitchen b complex vitamins tablet Take 1 tablet by mouth daily.    . furosemide (LASIX) 20 MG tablet Take 20 mg by mouth daily as needed for fluid.     Marland Kitchen lisinopril (ZESTRIL) 10 MG tablet     . loratadine (CLARITIN) 10 MG tablet Take 10 mg by mouth daily as needed for allergies.     . Menthol, Topical Analgesic, (BIOFREEZE EX) Apply 1 application topically daily as needed (muscle pain).    . mirtazapine (REMERON) 7.5 MG tablet Take 1 tablet (7.5 mg total) by mouth at bedtime. 90 tablet 3  . Multiple Vitamin (MULTIVITAMIN) capsule Take 1 capsule by mouth daily.    . ondansetron (ZOFRAN) 8 MG tablet Take 1 tablet (8 mg total) by mouth every 8 (eight) hours as needed for nausea or vomiting. 30 tablet 3  . SHARK CARTILAGE PO Take 100 mg by mouth daily.     No current facility-administered medications for this visit.    PHYSICAL EXAMINATION: ECOG PERFORMANCE STATUS: 1 - Symptomatic but completely ambulatory  Vitals:   11/16/20 1349  BP: (!) 159/77  Pulse: 84  Resp: 17  Temp: 98 F (36.7 C)  SpO2: 98%   Filed Weights   11/16/20 1349  Weight: 139 lb 8 oz (63.3 kg)    GENERAL:alert, no distress and comfortable SKIN: No rash EYES: sclera clear LYMPH:  no palpable cervical or supraclavicular  lymphadenopathy  LUNGS: clear with normal breathing effort HEART: regular rate & rhythm, no lower extremity edema ABDOMEN:abdomen soft and normal bowel sounds.  Mild tenderness to palpation in RUQ Musculoskeletal: No focal tenderness.   NEURO: alert & oriented x 3 with fluent speech, no focal motor/sensory deficits Breast exam: S/p left mastectomy, incision completely healed.  No nodularity or mass along the left chest wall or axilla.  Right breast without nipple discharge or inversion, no skin erythema.  No palpable mass in the right breast or axilla  LABORATORY DATA:  I have reviewed the data as listed CBC Latest Ref Rng & Units 11/11/2020 09/12/2020 03/14/2020  WBC 4.0 - 10.5 K/uL 7.0 9.2 6.9  Hemoglobin  12.0 - 15.0 g/dL 10.3(L) 11.7(L) 13.4  Hematocrit 36.0 - 46.0 % 30.8(L) 35.1(L) 39.7  Platelets 150.0 - 400.0 K/uL 298.0 279.0 212.0     CMP Latest Ref Rng & Units 11/11/2020 11/11/2020 09/12/2020  Glucose 70 - 99 mg/dL - 98 88  BUN 6 - 23 mg/dL - 49(H) 25(H)  Creatinine 0.40 - 1.20 mg/dL - 1.67(H) 1.74(H)  Sodium 135 - 145 mEq/L - 140 142  Potassium 3.5 - 5.1 mEq/L - 4.7 5.3 No hemolysis seen(H)  Chloride 96 - 112 mEq/L - 109 108  CO2 19 - 32 mEq/L - 22 25  Calcium 8.6 - 10.4 mg/dL 10.4 11.0(H) 11.0(H)  Total Protein 6.0 - 8.3 g/dL - 7.6 7.6  Total Bilirubin 0.2 - 1.2 mg/dL - 0.3 0.4  Alkaline Phos 39 - 117 U/L - 397(H) 384(H)  AST 0 - 37 U/L - 32 46(H)  ALT 0 - 35 U/L - 21 33      RADIOGRAPHIC STUDIES: I have personally reviewed the radiological images as listed and agreed with the findings in the report. No results found.   ASSESSMENT & PLAN: 79 year old female  1.  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  -S/p left mastectomy by Dr. Donne Hazel, final path showed pT2N0 stage Ib, ER/PR positive, HER-2 negative, grade 2 invasive lobular breast cancer.   -Oncotype showed intermediate risk 23, which predicts 10-year distant recurrence after 5 years of tamoxifen at 15%.  The benefit of chemotherapy in the intermediate risk group was felt to be small and controversial, adjuvant chemotherapy was not recommended.  - She did not require postmastectomy radiation.  -Due to high fracture risk on DEXA she was recommended to start adjuvant antiestrogen with tamoxifen in 08/2017, however she did not continue due to undesirable side effects and fearful of side effect profile (this was unknown to [place]) and she was lost to follow-up after that visit -6 months ago she developed low back pain after lifting a heavy table, work-up showed transaminitis, hypercalcemia, and mild anemia, Hg 10.3.  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 Shirley Wells presents with weakness, anorexia and weight loss, nausea, headache, and back pain.  The overall clinical picture is highly concerning for metastatic breast cancer. -We are referring her for staging PET scan and biopsy (L-spine or if other location more approachable pending PET scan).  Due to her nausea, headache, and generalized weakness I recommend MRI of the brain to rule out metastasis. -Due to her hypercalcemia, back pain, and compression fracture she is a candidate for Zometa.  She will retrieve the name of her dentist so we can obtain clearance.  We reviewed potential benefit and side effects and she agrees to proceed.  -I reviewed she is likely a candidate for palliative radiation to the lumbar spine, she is interested.  I will refer her.  In the meantime she will continue Norco 1-2 tabs every 4 as needed -I briefly reviewed if this is confirmed  recurrent metastatic breast cancer, this is considered stage IV and not likely curable but still treatable.  Will await prognostic profile from biopsy to determine treatment options, if she remains to be ER/PR  positive HER-2 negative, her treatment plan will likely include first-line AI plus CDK4/6.   2. Anorexia, weight loss, n/v, headache, weakness -secondary to #1, will proceed with full staging work up to r/o distant metastasis including brain -I recommend to ambulate with a cane at home due to her back pain and generalized weakness -I recommend to start mirtazapine for anorexia/weight loss, we reviewed potential side effects and she agrees to try.  Encouraged her to start nutrition supplements such as boost/Ensure remain well-hydrated -I will prescribe Zofran for nausea  3.  Chronic medical conditions: Pre-DM, CKD, history of CVA, HL, osteopenia -On amlodipine, clopidogrel, hydralazine, aspirin, furosemide, lisinopril, -CVA was around 7 years ago, no residual deficits or recurrent episodes -Due to hypercalcemia, I encouraged her to hold calcium for now, she can continue vitamin D - per PCP Dr. Billey Gosling  Plan -Work-up reviewed -Proceed with brain MRI, PET scan and biopsy -Obtain dental clearance for Zometa, patient to let us know dentist name and we will contact them -Refer to rad Onc to discuss palliative RT to lumbar spine -Rx: mirtazapine, zofran  -Follow-up after work-up to review results and finalize treatment plan -I will CC my note to Dr. Quay Burow  All questions were answered. The patient knows to call the clinic with any problems, questions or concerns. No barriers to learning were detected.  Total encounter time was 45 minutes.     Alla Feeling, NP 11/16/20

## 2020-11-17 ENCOUNTER — Telehealth: Payer: Self-pay | Admitting: *Deleted

## 2020-11-17 DIAGNOSIS — Z170 Estrogen receptor positive status [ER+]: Secondary | ICD-10-CM

## 2020-11-17 DIAGNOSIS — C50212 Malignant neoplasm of upper-inner quadrant of left female breast: Secondary | ICD-10-CM

## 2020-11-17 MED ORDER — MIRTAZAPINE 7.5 MG PO TABS: 7.5000 mg | ORAL_TABLET | Freq: Every day | ORAL | 3 refills | Status: DC

## 2020-11-17 NOTE — Telephone Encounter (Signed)
Called to report the ondansetron script was received at [facility] but no the mirtazapine. Informed her it was sent to [facility]. She reports that is not correct. She gets all her meds at [facility]. Re-sent script to [facility] as requested

## 2020-11-20 ENCOUNTER — Other Ambulatory Visit: Payer: Self-pay | Admitting: Hematology

## 2020-11-22 ENCOUNTER — Telehealth: Payer: Self-pay | Admitting: Hematology

## 2020-11-22 NOTE — Telephone Encounter (Signed)
Scheduled appt per 12/26 sch msg - pt is aware of appts on 1/4 and is aware of time gap between infusion and f/u .

## 2020-11-28 ENCOUNTER — Encounter (HOSPITAL_COMMUNITY)
Admission: RE | Admit: 2020-11-28 | Discharge: 2020-11-28 | Disposition: A | Discharge status: Home / Self Care | Payer: Medicare FFS | Source: Ambulatory Visit | Attending: Nurse Practitioner | Admitting: Nurse Practitioner

## 2020-11-28 ENCOUNTER — Other Ambulatory Visit: Payer: Self-pay

## 2020-11-28 DIAGNOSIS — Z170 Estrogen receptor positive status [ER+]: Secondary | ICD-10-CM | POA: Diagnosis present

## 2020-11-28 DIAGNOSIS — I700 Atherosclerosis of aorta: Secondary | ICD-10-CM | POA: Insufficient documentation

## 2020-11-28 DIAGNOSIS — C50212 Malignant neoplasm of upper-inner quadrant of left female breast: Secondary | ICD-10-CM | POA: Insufficient documentation

## 2020-11-28 DIAGNOSIS — C7951 Secondary malignant neoplasm of bone: Secondary | ICD-10-CM | POA: Insufficient documentation

## 2020-11-28 LAB — GLUCOSE, CAPILLARY: Glucose-Capillary: 108 mg/dL — ABNORMAL HIGH (ref 70–99)

## 2020-11-28 MED ORDER — FLUDEOXYGLUCOSE F - 18 (FDG) INJECTION
6.9700 | Freq: Once | INTRAVENOUS | Status: AC | PRN
  Administered 2020-11-28: 6.97 via INTRAVENOUS

## 2020-11-28 NOTE — Progress Notes (Addendum)
[place]   [phone] Fax:([phone]) [phone]   Clinic Follow up Note   Patient Care Team: Binnie Rail, MD as PCP - General (Internal Medicine) Garvin Fila, MD as Consulting Physician (Neurology) Rolm Bookbinder, MD as Consulting Physician (General Surgery) Truitt Merle, MD as Consulting Physician (Hematology) Kyung Rudd, MD as Consulting Physician (Radiation Oncology)  Date of Service:  11/29/2020  CHIEF COMPLAINT: Discuss PET scan findings  SUMMARY OF ONCOLOGIC HISTORY: Oncology History Overview Note  Cancer Staging Malignant neoplasm of upper-inner quadrant of left breast in female, estrogen receptor positive ([place]) Staging form: Breast, AJCC 8th Edition - Clinical stage from 06/18/2017: Stage IB (cT2, cN0, cM0, G2, ER: Positive, PR: Positive, HER2: Negative) - Signed by Truitt Merle, MD on 06/24/2017 - Pathologic stage from 07/30/2017: Stage IA (pT2, pN0, cM0, G2, ER: Positive, PR: Positive, HER2: Negative, Oncotype DX score: 23) - Signed by Truitt Merle, MD on 08/23/2017     Malignant neoplasm of upper-inner quadrant of left breast in female, estrogen receptor positive ([place])  06/17/2017 Mammogram   [place] and MM diagnostic Breast Tomo Bilateral  IMPRESSION: 1. 3.4 palpable mass in the 10 o'clock position of the left breast is highly suspicious for malignancy. 2. No evidence of malignancy in the right breast. 3. Ultrasound of the left axilla shows normal sized axillary lymph nodes.   06/18/2017 Initial Biopsy   Diagnosis 06/18/17 Breast, left, needle core biopsy, 10:00 o'clock - INVASIVE MAMMARY CARCINOMA. At least G2  The malignant cells are negative for E-Cadherin, supporting a lobular phenotype.   06/18/2017 Initial Diagnosis   Malignant neoplasm of upper-inner quadrant of left breast in female, estrogen receptor positive ([place])   06/18/2017 Receptors her2   ER 95%+, PR 25%+, both strong staining  HER2- Ki67 30%    07/30/2017 Surgery   LEFT TOTAL  MASTECTOMY WITH AXILLARY SENTINEL LYMPH NODE BIOPSY by Dr. Donne Hazel on 07/30/17   07/30/2017 Pathology Results   Diagnosis 07/30/17 1. Breast, simple mastectomy, Left Total - INVASIVE LOBULAR CARCINOMA, GRADE 2, SPANNING 3.5 CM. - LOBULAR CARCINOMA IN SITU. - INVASIVE CARCINOMA IS BROADLY 0.1 CM FROM POSTERIOR MARGIN. - PERINEURAL INVASION PRESENT. - SEE ONCOLOGY TABLE. 2. Lymph node, sentinel, biopsy, Left Axillary - ONE OF ONE LYMPH NODES NEGATIVE FOR CARCINOMA (0/1). 3. Lymph node, sentinel, biopsy, Left - ONE OF ONE LYMPH NODES NEGATIVE FOR CARCINOMA (0/1).   07/30/2017 Oncotype testing   Oncotype 07/30/17 Recurrence score is 23, an intermediate risk With a 10-year risk of recurrence at 15% with tamoxifen alone.     11/11/2020 Imaging   MRI Lumbar Spine  IMPRESSION: Diffuse osseous metastases. Chronic inferior L2 endplate deformity with mild height loss.   Likely late subacute pathologic fracture deformities involving the superior L1 endplate and right L3 lamina.   Multilevel spondylosis. Moderate to severe spinal canal and neural foraminal narrowing at the L3-4 and L4-5 levels.   Mild to moderate spinal canal and neural foraminal narrowing at the L5-S1 level.   11/28/2020 PET scan   IMPRESSION: 1. Widespread and diffuse hypermetabolic bony metastases. 2. No evidence for soft tissue metastases in the neck, chest, abdomen, or pelvis. 3.  Aortic Atherosclerois (ICD10-170.0)        CURRENT THERAPY:  Pending   INTERVAL HISTORY:  Shirley Wells is here for a follow up of left breast cancer. She was last seen by me in 07/2017 and seen by NP Lacie last month. She presents to the clinic with her daughter.  She came  in a wheelchair today.  She has severe left side hip pain when she walks, which has limited her mobility.  She also reports moderate pain rib cage, with position change or laying down.  She denies significant pain or tenderness in the spine, or any pain at rest or sitting.   She also noticed low appetite, moderate fatigue, and some weight loss. Mild nausea, no vomiting.  Her bowel movements been normal.  She has lost 19 pounds in the past 6 months.  All other systems were reviewed with the patient and are negative.  MEDICAL HISTORY:  Past Medical History:  Diagnosis Date  . CHICKENPOX, HX OF 03/21/2010   Qualifier: Diagnosis of  By: Marca Ancona RMA, Lucy    . 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 Dyslipidemia    intol of statin due to >3x increase LFTs  . GERD (gastroesophageal reflux disease)    hx  . Hypertension, essential   . Malignant neoplasm of upper-inner quadrant of left breast in female, estrogen receptor positive ([place]) 06/20/2017  . Osteoarthritis   . Stroke [facility])     SURGICAL HISTORY: Past Surgical History:  Procedure Laterality Date  . CHOLECYSTECTOMY  1981  . MASTECTOMY W/ SENTINEL NODE BIOPSY Left 07/30/2017   Procedure: LEFT TOTAL MASTECTOMY WITH AXILLARY SENTINEL LYMPH NODE BIOPSY;  Surgeon: Rolm Bookbinder, MD;  Location: [place];  Service: General;  Laterality: Left;    I have reviewed the social history and family history with the patient and they are unchanged from previous note.  ALLERGIES:  is allergic to influenza vaccines, pneumococcal vaccines, simvastatin, tetanus toxoids, and zoster vaccine live.  MEDICATIONS:  Current Outpatient Medications  Medication Sig Dispense Refill  . amLODipine (NORVASC) 5 MG tablet Take 1 tablet (5 mg total) by mouth daily. 90 tablet 3  . amoxicillin (AMOXIL) 500 MG tablet     . Ascorbic Acid (VITAMIN C) 1000 MG tablet Take 1,000 mg by mouth daily.    Marland Kitchen aspirin 81 MG tablet Take 81 mg by mouth 2 (two) times a week.    Marland Kitchen b complex vitamins tablet Take 1 tablet by mouth daily.    . Cholecalciferol (VITAMIN D3) 1000 UNIT/SPRAY LIQD Take 2 sprays by mouth daily.    . clopidogrel (PLAVIX) 75 MG tablet Take 1 tablet (75  mg total) by mouth daily. 90 tablet 1  . furosemide (LASIX) 20 MG tablet Take 20 mg by mouth daily as needed for fluid.    . hydrALAZINE (APRESOLINE) 25 MG tablet Take 1 tablet (25 mg total) by mouth in the morning and at bedtime. 60 tablet 5  . HYDROcodone-acetaminophen (NORCO/VICODIN) 5-325 MG tablet Take 1-2 tablets by mouth every 4 (four) hours as needed for severe pain. 60 tablet 0  . lisinopril (ZESTRIL) 10 MG tablet     . loratadine (CLARITIN) 10 MG tablet Take 10 mg by mouth daily as needed for allergies.    . Menthol, Topical Analgesic, (BIOFREEZE EX) Apply 1 application topically daily as needed (muscle pain).    . mirtazapine (REMERON) 7.5 MG tablet Take 1 tablet (7.5 mg total) by mouth at bedtime. 90 tablet 3  . Multiple Vitamin (MULTIVITAMIN) capsule Take 1 capsule by mouth daily.    . ondansetron (ZOFRAN) 8 MG tablet Take 1 tablet (8 mg total) by mouth every 8 (eight) hours as needed for nausea or vomiting. 30 tablet 3  . SHARK CARTILAGE  PO Take 100 mg by mouth daily.     No current facility-administered medications for this visit.    PHYSICAL EXAMINATION: ECOG PERFORMANCE STATUS: 3 - Symptomatic, >50% confined to bed  Vitals:   11/29/20 1206  BP: (!) 146/72  Pulse: 85  Resp: 16  Temp: 98.4 F (36.9 C)  SpO2: 98%   Filed Weights   11/29/20 1206  Weight: 140 lb 11.2 oz (63.8 kg)    GENERAL:alert, no distress and comfortable SKIN: skin color, texture, turgor are normal, no rashes or significant lesions EYES: normal, Conjunctiva are pink and non-injected, sclera clear NECK: supple, thyroid normal size, non-tender, without nodularity LYMPH:  no palpable lymphadenopathy in the cervical, axillary  LUNGS: clear to auscultation and percussion with normal breathing effort HEART: regular rate & rhythm and no murmurs and no lower extremity edema ABDOMEN:abdomen soft, non-tender and normal bowel sounds Musculoskeletal:no cyanosis of digits and no clubbing, no tenderness in  the spine or hips NEURO: alert & oriented x 3 with fluent speech, no focal motor/sensory deficits  LABORATORY DATA:  I have reviewed the data as listed CBC Latest Ref Rng & Units 11/29/2020 11/11/2020 09/12/2020  WBC 4.0 - 10.5 K/uL 6.8 7.0 9.2  Hemoglobin 12.0 - 15.0 g/dL 9.7(L) 10.3(L) 11.7(L)  Hematocrit 36.0 - 46.0 % 29.6(L) 30.8(L) 35.1(L)  Platelets 150 - 400 K/uL 275 298.0 279.0     CMP Latest Ref Rng & Units 11/29/2020 11/11/2020 11/11/2020  Glucose 70 - 99 mg/dL 140(H) - 98  BUN 8 - 23 mg/dL 36(H) - 49(H)  Creatinine 0.44 - 1.00 mg/dL 1.85(H) - 1.67(H)  Sodium 135 - 145 mmol/L 141 - 140  Potassium 3.5 - 5.1 mmol/L 5.1 - 4.7  Chloride 98 - 111 mmol/L 109 - 109  CO2 22 - 32 mmol/L 23 - 22  Calcium 8.9 - 10.3 mg/dL 10.1 10.4 11.0(H)  Total Protein 6.5 - 8.1 g/dL 7.5 - 7.6  Total Bilirubin 0.3 - 1.2 mg/dL 0.3 - 0.3  Alkaline Phos 38 - 126 U/L 464(H) - 397(H)  AST 15 - 41 U/L 37 - 32  ALT 0 - 44 U/L 21 - 21      RADIOGRAPHIC STUDIES: I have personally reviewed the radiological images as listed and agreed with the findings in the report. NM PET Image Initial (PI) Skull Base To Thigh  Result Date: 11/28/2020 CLINICAL DATA:  Subsequent treatment strategy for breast cancer. Osseous metastatic disease on recent lumbar spine MRI. EXAM: NUCLEAR MEDICINE PET SKULL BASE TO THIGH TECHNIQUE: 7.0 mCi F-18 FDG was injected intravenously. Full-ring PET imaging was performed from the skull base to thigh after the radiotracer. CT data was obtained and used for attenuation correction and anatomic localization. Fasting blood glucose: 100 a mg/dl COMPARISON:  None. FINDINGS: Mediastinal blood pool activity: SUV max 2.8 Liver activity: SUV max NA NECK: No hypermetabolic lymph nodes in the neck. Incidental CT findings: none CHEST: No hypermetabolic mediastinal or hilar nodes. No suspicious pulmonary nodules on the CT scan. Incidental CT findings: Atherosclerotic calcification is noted in the wall of the  thoracic aorta. No suspicious pulmonary nodule or mass. ABDOMEN/PELVIS: No abnormal hypermetabolic activity within the liver, pancreas, adrenal glands, or spleen. No hypermetabolic lymph nodes in the abdomen or pelvis. Incidental CT findings: Small cyst noted left kidney. There is abdominal aortic atherosclerosis without aneurysm. 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 SKELETON: Diffuse heterogeneous marrow uptake compatible with widespread bony metastatic involvement. Incidental CT findings: Bony anatomy is markedly heterogeneous compatible  with widespread diffuse bony metastatic involvement. IMPRESSION: 1. Widespread and diffuse hypermetabolic bony metastases. 2. No evidence for soft tissue metastases in the neck, chest, abdomen, or pelvis. 3.  Aortic Atherosclerois (ICD10-170.0) Electronically Signed   By: Misty Stanley M.D.   On: 11/28/2020 11:58     ASSESSMENT & PLAN:  PANSY OSTROVSKY is a 80 y.o. female with    1. Diffuse bone metastasis and L1/L3 fracture, and hypercalcemia  -she presented with significant left hip and rib cage pain for 6 months, lumbar MRI showed diffuse bone metastasis and subacute pathologic fracture of L1 and L3 -I personally reviewed her PET scan images with patient and her daughter today.  It showed diffuse bone metastasis in pelvis, femur, spine, and ribs.  No visceral metastasis. -Due to her severe pain in the left hip, she was referred to radiation oncology, for palliative radiation -will give zometa infusion today, she does not have active dental issues now, I encourage her to f/u with her dentist  -I recommend bone lesion biopsy by interventional radiology to confirm metastatic cancer, discussed with IR Dr. Pascal Lux, he recommends to biopsy her iliac bone lesion -She is not having significant back pain or tenderness from her lumbar fracture, Dr. Pascal Lux does not recommend osteocool or kyphoplasty at this point. -If her biopsy confirms metastatic  cancer from previous breast cancer, ER positive, I recommend and Ibrance as first-line therapy.  I discussed the benefit and potential side effects with patient and her daughter in details, she is interested.  I also gave her the handout about this medications.   2. 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 She was treated with left total mastectomy. She did not require radiation and declined Tamoxifen due to side effects. She lost follow up after 07/2017.    3. Anorexia, weight loss, n/v, headache, weakness -secondary to #1, will proceed with full staging work up to r/o distant metastasis including brain -I recommend to ambulate with a cane at home due to her back pain and generalized weakness -she could not tolerate mirtazapine -continue zofran as needed  -I encourage her to take nutritional supplement  4. Chronic comorbidities: Pre-DM, CKD, history of CVA, HL, osteopenia -On amlodipine, clopidogrel, hydralazine, aspirin, furosemide, lisinopril, -CVA was around 7 years ago, no residual deficits or recurrent episodes -She has had osteoporosis as seen on 2016 and 08/2017 DEXA. Her 08/2020 DEXA improved to osteopenia.  -Due to hypercalcemia, I encouraged her to hold calcium for now, she can continue vitamin D -Continue to F/u with PCP Dr. Billey Gosling   PLAN:  -PET scan reviewed -IR CT guided to iliac bone lesion biopsy in the next week -She is scheduled to see radiation oncology later this week -Lab and follow-up in 2 weeks to review biopsy result, and finalize her systemic treatment -zometa infusion today    No problem-specific Assessment & Plan notes found for this encounter.   Orders Placed This Encounter  Procedures  . CT Biopsy    Please biopsy one of her iliac bone lesion. This is likely metastatic breast cancer, needs  ER/PR/HER2 molecular testing please handle bone biopsy sample appropriately (avoid decalcification if possible)    Standing Status:   Future    Standing Expiration Date:   11/29/2021    Scheduling Instructions:     Dr. Pascal Lux approved, please schedule the biopsy as soon  as possible    Order Specific Question:   Lab orders requested (DO NOT place separate lab orders, these will be automatically ordered during procedure specimen collection):    Answer:   Surgical Pathology    Order Specific Question:   Reason for Exam (SYMPTOM  OR DIAGNOSIS REQUIRED)    Answer:   confirm metastatic cancer    Order Specific Question:   Preferred location?    Answer:   [facility]    Order Specific Question:   Release to patient    Answer:   Immediate   All questions were answered. The patient knows to call the clinic with any problems, questions or concerns. No barriers to learning was detected. The total time spent in the appointment was 40 minutes.     Truitt Merle, MD 11/29/2020   I, Joslyn Devon, am acting as scribe for Truitt Merle, MD.   I have reviewed the above documentation for accuracy and completeness, and I agree with the above.

## 2020-11-29 ENCOUNTER — Other Ambulatory Visit: Payer: Self-pay

## 2020-11-29 ENCOUNTER — Encounter: Payer: Self-pay | Admitting: Hematology

## 2020-11-29 ENCOUNTER — Inpatient Hospital Stay: Payer: Medicare FFS

## 2020-11-29 ENCOUNTER — Inpatient Hospital Stay: Payer: Medicare FFS | Attending: Hematology | Admitting: Hematology

## 2020-11-29 VITALS — BP 146/72 | HR 85 | Temp 98.4°F | Resp 16 | Ht 63.0 in | Wt 140.7 lb

## 2020-11-29 DIAGNOSIS — E785 Hyperlipidemia, unspecified: Secondary | ICD-10-CM | POA: Diagnosis not present

## 2020-11-29 DIAGNOSIS — Z170 Estrogen receptor positive status [ER+]: Secondary | ICD-10-CM

## 2020-11-29 DIAGNOSIS — Z9221 Personal history of antineoplastic chemotherapy: Secondary | ICD-10-CM | POA: Diagnosis not present

## 2020-11-29 DIAGNOSIS — Z79811 Long term (current) use of aromatase inhibitors: Secondary | ICD-10-CM | POA: Diagnosis not present

## 2020-11-29 DIAGNOSIS — Z7982 Long term (current) use of aspirin: Secondary | ICD-10-CM | POA: Insufficient documentation

## 2020-11-29 DIAGNOSIS — Z8673 Personal history of transient ischemic attack (TIA), and cerebral infarction without residual deficits: Secondary | ICD-10-CM | POA: Diagnosis not present

## 2020-11-29 DIAGNOSIS — E875 Hyperkalemia: Secondary | ICD-10-CM | POA: Insufficient documentation

## 2020-11-29 DIAGNOSIS — D649 Anemia, unspecified: Secondary | ICD-10-CM | POA: Insufficient documentation

## 2020-11-29 DIAGNOSIS — C50212 Malignant neoplasm of upper-inner quadrant of left female breast: Secondary | ICD-10-CM

## 2020-11-29 DIAGNOSIS — Z79899 Other long term (current) drug therapy: Secondary | ICD-10-CM | POA: Diagnosis not present

## 2020-11-29 DIAGNOSIS — Z7902 Long term (current) use of antithrombotics/antiplatelets: Secondary | ICD-10-CM | POA: Insufficient documentation

## 2020-11-29 DIAGNOSIS — R5383 Other fatigue: Secondary | ICD-10-CM | POA: Insufficient documentation

## 2020-11-29 DIAGNOSIS — R634 Abnormal weight loss: Secondary | ICD-10-CM | POA: Diagnosis not present

## 2020-11-29 DIAGNOSIS — M25552 Pain in left hip: Secondary | ICD-10-CM | POA: Insufficient documentation

## 2020-11-29 DIAGNOSIS — I129 Hypertensive chronic kidney disease with stage 1 through stage 4 chronic kidney disease, or unspecified chronic kidney disease: Secondary | ICD-10-CM | POA: Diagnosis not present

## 2020-11-29 DIAGNOSIS — M47819 Spondylosis without myelopathy or radiculopathy, site unspecified: Secondary | ICD-10-CM | POA: Insufficient documentation

## 2020-11-29 DIAGNOSIS — Z923 Personal history of irradiation: Secondary | ICD-10-CM | POA: Diagnosis not present

## 2020-11-29 DIAGNOSIS — C7951 Secondary malignant neoplasm of bone: Secondary | ICD-10-CM | POA: Diagnosis not present

## 2020-11-29 DIAGNOSIS — E1122 Type 2 diabetes mellitus with diabetic chronic kidney disease: Secondary | ICD-10-CM | POA: Diagnosis not present

## 2020-11-29 DIAGNOSIS — Z9012 Acquired absence of left breast and nipple: Secondary | ICD-10-CM | POA: Insufficient documentation

## 2020-11-29 DIAGNOSIS — N189 Chronic kidney disease, unspecified: Secondary | ICD-10-CM | POA: Insufficient documentation

## 2020-11-29 DIAGNOSIS — K219 Gastro-esophageal reflux disease without esophagitis: Secondary | ICD-10-CM | POA: Insufficient documentation

## 2020-11-29 LAB — COMPREHENSIVE METABOLIC PANEL
ALT: 21 U/L (ref 0–44)
AST: 37 U/L (ref 15–41)
Albumin: 3 g/dL — ABNORMAL LOW (ref 3.5–5.0)
Alkaline Phosphatase: 464 U/L — ABNORMAL HIGH (ref 38–126)
Anion gap: 9 (ref 5–15)
BUN: 36 mg/dL — ABNORMAL HIGH (ref 8–23)
CO2: 23 mmol/L (ref 22–32)
Calcium: 10.1 mg/dL (ref 8.9–10.3)
Chloride: 109 mmol/L (ref 98–111)
Creatinine, Ser: 1.85 mg/dL — ABNORMAL HIGH (ref 0.44–1.00)
GFR, Estimated: 27 mL/min — ABNORMAL LOW (ref >60)
Glucose, Bld: 140 mg/dL — ABNORMAL HIGH (ref 70–99)
Potassium: 5.1 mmol/L (ref 3.5–5.1)
Sodium: 141 mmol/L (ref 135–145)
Total Bilirubin: 0.3 mg/dL (ref 0.3–1.2)
Total Protein: 7.5 g/dL (ref 6.5–8.1)

## 2020-11-29 LAB — CBC WITH DIFFERENTIAL/PLATELET
Abs Immature Granulocytes: 0.08 10*3/uL — ABNORMAL HIGH (ref 0.00–0.07)
Basophils Absolute: 0.1 10*3/uL (ref 0.0–0.1)
Basophils Relative: 1 %
Eosinophils Absolute: 0.3 10*3/uL (ref 0.0–0.5)
Eosinophils Relative: 5 %
HCT: 29.6 % — ABNORMAL LOW (ref 36.0–46.0)
Hemoglobin: 9.7 g/dL — ABNORMAL LOW (ref 12.0–15.0)
Immature Granulocytes: 1 %
Lymphocytes Relative: 30 %
Lymphs Abs: 2 10*3/uL (ref 0.7–4.0)
MCH: 32.8 pg (ref 26.0–34.0)
MCHC: 32.8 g/dL (ref 30.0–36.0)
MCV: 100 fL (ref 80.0–100.0)
Monocytes Absolute: 0.5 10*3/uL (ref 0.1–1.0)
Monocytes Relative: 7 %
Neutro Abs: 3.8 10*3/uL (ref 1.7–7.7)
Neutrophils Relative %: 56 %
Platelets: 275 10*3/uL (ref 150–400)
RBC: 2.96 MIL/uL — ABNORMAL LOW (ref 3.87–5.11)
RDW: 14.9 % (ref 11.5–15.5)
WBC: 6.8 10*3/uL (ref 4.0–10.5)
nRBC: 0 % (ref 0.0–0.2)

## 2020-11-29 MED ORDER — ZOLEDRONIC ACID 4 MG/100ML IV SOLN
INTRAVENOUS | Status: AC
  Filled 2020-11-29: qty 100

## 2020-11-29 MED ORDER — SODIUM CHLORIDE 0.9 % IV SOLN
Freq: Once | INTRAVENOUS | Status: AC
  Filled 2020-11-29: qty 250

## 2020-11-29 MED ORDER — ZOLEDRONIC ACID 4 MG/100ML IV SOLN
4.0000 mg | Freq: Once | INTRAVENOUS | Status: AC
  Administered 2020-11-29: 4 mg via INTRAVENOUS

## 2020-11-29 NOTE — Progress Notes (Signed)
CrCl approximately 71mL/min.  Using Zometa for hypercalcemia of malignancy so will continue 4mg  dose

## 2020-11-29 NOTE — Patient Instructions (Signed)
Zoledronic Acid injection (Hypercalcemia, Oncology) What is this medicine? ZOLEDRONIC ACID (ZOE le dron ik AS id) lowers the amount of calcium loss from bone. It is used to treat too much calcium in your blood from cancer. It is also used to prevent complications of cancer that has spread to the bone. This medicine may be used for other purposes; ask your health care provider or pharmacist if you have questions. COMMON BRAND NAME(S): Zometa What should I tell my health care provider before I take this medicine? They need to know if you have any of these conditions:  aspirin-sensitive asthma  cancer, especially if you are receiving medicines used to treat cancer  dental disease or wear dentures  infection  kidney disease  receiving corticosteroids like dexamethasone or prednisone  an unusual or allergic reaction to zoledronic acid, other medicines, foods, dyes, or preservatives  pregnant or trying to get pregnant  breast-feeding How should I use this medicine? This medicine is for infusion into a vein. It is given by a health care professional in a hospital or clinic setting. Talk to your pediatrician regarding the use of this medicine in children. Special care may be needed. Overdosage: If you think you have taken too much of this medicine contact a poison control center or emergency room at once. NOTE: This medicine is only for you. Do not share this medicine with others. What if I miss a dose? It is important not to miss your dose. Call your doctor or health care professional if you are unable to keep an appointment. What may interact with this medicine?  certain antibiotics given by injection  NSAIDs, medicines for pain and inflammation, like ibuprofen or naproxen  some diuretics like bumetanide, furosemide  teriparatide  thalidomide This list may not describe all possible interactions. Give your health care provider a list of all the medicines, herbs, non-prescription  drugs, or dietary supplements you use. Also tell them if you smoke, drink alcohol, or use illegal drugs. Some items may interact with your medicine. What should I watch for while using this medicine? Visit your doctor or health care professional for regular checkups. It may be some time before you see the benefit from this medicine. Do not stop taking your medicine unless your doctor tells you to. Your doctor may order blood tests or other tests to see how you are doing. Women should inform their doctor if they wish to become pregnant or think they might be pregnant. There is a potential for serious side effects to an unborn child. Talk to your health care professional or pharmacist for more information. You should make sure that you get enough calcium and vitamin D while you are taking this medicine. Discuss the foods you eat and the vitamins you take with your health care professional. Some people who take this medicine have severe bone, joint, and/or muscle pain. This medicine may also increase your risk for jaw problems or a broken thigh bone. Tell your doctor right away if you have severe pain in your jaw, bones, joints, or muscles. Tell your doctor if you have any pain that does not go away or that gets worse. Tell your dentist and dental surgeon that you are taking this medicine. You should not have major dental surgery while on this medicine. See your dentist to have a dental exam and fix any dental problems before starting this medicine. Take good care of your teeth while on this medicine. Make sure you see your dentist for regular follow-up   appointments. What side effects may I notice from receiving this medicine? Side effects that you should report to your doctor or health care professional as soon as possible:  allergic reactions like skin rash, itching or hives, swelling of the face, lips, or tongue  anxiety, confusion, or depression  breathing problems  changes in vision  eye  pain  feeling faint or lightheaded, falls  jaw pain, especially after dental work  mouth sores  muscle cramps, stiffness, or weakness  redness, blistering, peeling or loosening of the skin, including inside the mouth  trouble passing urine or change in the amount of urine Side effects that usually do not require medical attention (report to your doctor or health care professional if they continue or are bothersome):  bone, joint, or muscle pain  constipation  diarrhea  fever  hair loss  irritation at site where injected  loss of appetite  nausea, vomiting  stomach upset  trouble sleeping  trouble swallowing  weak or tired This list may not describe all possible side effects. Call your doctor for medical advice about side effects. You may report side effects to FDA at 1-800-FDA-1088. Where should I keep my medicine? This drug is given in a hospital or clinic and will not be stored at home. NOTE: This sheet is a summary. It may not cover all possible information. If you have questions about this medicine, talk to your doctor, pharmacist, or health care provider.  2020 Elsevier/Gold Standard (2014-04-10 14:19:39)  

## 2020-11-29 NOTE — Progress Notes (Signed)
Pt. Tolerated infusion well, No signs or symptoms of side effects. IV removed tip intact gauze and Co flex placed. Pt left accompanied by daughter in wheelchair. No further problems or concerns noted.

## 2020-11-30 ENCOUNTER — Encounter (HOSPITAL_COMMUNITY): Payer: Self-pay | Admitting: Radiology

## 2020-11-30 ENCOUNTER — Telehealth: Payer: Self-pay | Admitting: Hematology

## 2020-11-30 NOTE — Progress Notes (Signed)
Histology and Location of Primary Cancer: Diffuse bone metastasis and L1/L3 fracture.  She presented with significant left hip and rib cage pain for 6 months.  CT Biopsy 12/09/2020:   MRI Brain 12/04/2020:  PET 11/28/2020: Widespread and diffuse hypermetabolic bony metastases.  No evidence for soft tissue metastases in the neck, chest, abdomen, or pelvis.  Lumbar MRI 11/11/2020: Diffuse osseous metastases.  Chronic inferior L2 endplate deformity with mild height loss.  Likely late subacute pathologic fracture deformities involving the superior L1 endplate and right L3 lamina.  Multilevel spondylosis.  Moderate to severe spinal canal and neural foraminal narrowing at the L3-4 and L4-5 levels.  Mild to moderate spinal canal and neural foraminal narrowing at the L5-S1 level.    Past/Anticipated chemotherapy by medical oncology, if any:  Dr. Burr Medico 11/29/2020 -Due to her severe pain in the left hip, she was referred to radiation oncology, for palliative radiation. -I recommend bone lesion biopsy by interventional radiology, discussed with IR Dr. Pascal Lux, he recommends to biopsy her iliac bone lesion. -She is not having significant back pain or tenderness from her lumbar fracture, Dr. Pascal Lux does not recommend osteocool or kyphoplasty at this point. -If her biopsy confirms metastatic cancer from previous breast cancer, ER positive, I recommended Ibrance as first-line therapy.   Pain on a scale of 0-10 is: 6-7/10 lower back, taking hydrocodone BID   Ambulatory status? Walker? Wheelchair?: Uses cane and walker on occasion.  Mostly uses things near by when walking in the home.  SAFETY ISSUES:  Prior radiation? No  Pacemaker/ICD? No  Possible current pregnancy? Postmenopausal  Is the patient on methotrexate? No  Current Complaints / other details:   -History of invasive lobular left breast cancer in upper inner quadrant- total mastectomy, no radiation, declined tamoxifen due to side effects 05/2017.   Lost to follow-up 07/2017.

## 2020-11-30 NOTE — Progress Notes (Signed)
Shirley Wells. [name] Female, 80 y.o., 1940/12/12  MRN:  [id number] Phone:  [phone] (H) ...       PCP:  Binnie Rail, MD Coverage:  [facility] Medicare/[employer] Pffs  Next Appt With Radiology (GI-315 MR 1) 12/04/2020 at 3:00 PM           RE: CT Biopsy Received: Today Sandi Mariscal, MD  Garth Bigness D  OK for CT guided iliac lesion Bx.   Per Dr Burr Medico - please send for ER/PR/HER2 molecular testing please handle bone biopsy sample appropriately (avoid decalcification if possible)   Thx,  Ulice Dash        Previous Messages   ----- Message -----  From: Garth Bigness D  Sent: 11/29/2020  8:10 PM EST  To: Ir Procedure Requests  Subject: CT Biopsy                     Procedure:  CT Biopsy   Reason:  Malignant neoplasm of upper-inner quadrant of left breast in female, estrogen receptor positive, confirm metastatic cancer,  Please biopsy one of her iliac bone lesion. This is likely metastatic breast cancer, needs ER/PR/HER2 molecular testing please handle bone biopsy sample appropriately (avoid decalcification if possible)    History: [place], NM PET, MR in computer   Provider: Truitt Merle   Provider Contact:  [phone]

## 2020-11-30 NOTE — Progress Notes (Signed)
Shirley Wells. Stein[place] Female, 80 y.o., 02-20-1941  MRN:  [id number] Phone:  [phone] (H) ...       PCP:  Binnie Rail, MD Coverage:  [facility] Medicare/[employer] Medicare Pffs  Next Appt With Radiology (GI-315 MR 1) 12/04/2020 at 3:00 PM           RE: CT Biopsy Received: Rosalie Gums, MD  Garth Bigness D  Yes, ok to hold for the 5 days. Thank you.   Binnie Rail, MD        Previous Messages   ----- Message -----  From: Garth Bigness D  Sent: 11/29/2020  8:17 PM EST  To: Binnie Rail, MD  Subject: FW: CT Biopsy                   Dr. Quay Burow there is an order in epic from Dr Burr Medico for [name] to have a biopsy done. I see that she in on Plavix and this will need to be hold it for 5 days prior to her biopsy. Please advise if it is okay to hold prior to her biopsy. Thanks  ----- Message -----  From: Garth Bigness D  Sent: 11/29/2020  8:10 PM EST  To: Ir Procedure Requests  Subject: CT Biopsy                     Procedure:  CT Biopsy   Reason:  Malignant neoplasm of upper-inner quadrant of left breast in female, estrogen receptor positive, confirm metastatic cancer,  Please biopsy one of her iliac bone lesion. This is likely metastatic breast cancer, needs ER/PR/HER2 molecular testing please handle bone biopsy sample appropriately (avoid decalcification if possible)    History: [place], NM PET, MR in computer   Provider: Truitt Merle   Provider Contact:  [phone]

## 2020-11-30 NOTE — Telephone Encounter (Signed)
Scheduled appts per 1/4 los. Pt confirmed appt date and time.  °

## 2020-12-01 ENCOUNTER — Ambulatory Visit
Admission: RE | Admit: 2020-12-01 | Discharge: 2020-12-01 | Disposition: A | Discharge status: Home / Self Care | Payer: Medicare FFS | Source: Ambulatory Visit | Attending: Radiation Oncology | Admitting: Radiation Oncology

## 2020-12-01 ENCOUNTER — Encounter: Payer: Self-pay | Admitting: Radiation Oncology

## 2020-12-01 ENCOUNTER — Ambulatory Visit
Admission: RE | Admit: 2020-12-01 | Discharge: 2020-12-01 | Disposition: A | Discharge status: Home / Self Care | Payer: Medicare FFS | Source: Ambulatory Visit | Attending: Hematology | Admitting: Hematology

## 2020-12-01 ENCOUNTER — Other Ambulatory Visit: Payer: Self-pay

## 2020-12-01 VITALS — BP 125/61 | HR 77 | Temp 98.3°F | Resp 18 | Ht 62.0 in | Wt 139.8 lb

## 2020-12-01 DIAGNOSIS — I10 Essential (primary) hypertension: Secondary | ICD-10-CM | POA: Diagnosis not present

## 2020-12-01 DIAGNOSIS — Z170 Estrogen receptor positive status [ER+]: Secondary | ICD-10-CM

## 2020-12-01 DIAGNOSIS — Z7982 Long term (current) use of aspirin: Secondary | ICD-10-CM | POA: Insufficient documentation

## 2020-12-01 DIAGNOSIS — Z9012 Acquired absence of left breast and nipple: Secondary | ICD-10-CM | POA: Diagnosis not present

## 2020-12-01 DIAGNOSIS — Z803 Family history of malignant neoplasm of breast: Secondary | ICD-10-CM | POA: Diagnosis not present

## 2020-12-01 DIAGNOSIS — Z8673 Personal history of transient ischemic attack (TIA), and cerebral infarction without residual deficits: Secondary | ICD-10-CM | POA: Diagnosis not present

## 2020-12-01 DIAGNOSIS — K219 Gastro-esophageal reflux disease without esophagitis: Secondary | ICD-10-CM | POA: Insufficient documentation

## 2020-12-01 DIAGNOSIS — C7951 Secondary malignant neoplasm of bone: Secondary | ICD-10-CM | POA: Insufficient documentation

## 2020-12-01 DIAGNOSIS — E785 Hyperlipidemia, unspecified: Secondary | ICD-10-CM | POA: Diagnosis not present

## 2020-12-01 DIAGNOSIS — C50912 Malignant neoplasm of unspecified site of left female breast: Secondary | ICD-10-CM | POA: Diagnosis not present

## 2020-12-01 DIAGNOSIS — Z79899 Other long term (current) drug therapy: Secondary | ICD-10-CM | POA: Insufficient documentation

## 2020-12-01 DIAGNOSIS — C50212 Malignant neoplasm of upper-inner quadrant of left female breast: Secondary | ICD-10-CM

## 2020-12-01 NOTE — Progress Notes (Signed)
Radiation Oncology         [phone] ________________________________  Name: Shirley Wells        MRN: [id number]  Date of Service: 12/01/2020 DOB: 10-07-1941  CC:Binnie Rail, MD  Truitt Merle, MD     REFERRING PHYSICIAN: Truitt Merle, MD   DIAGNOSIS: The encounter diagnosis was Secondary malignant neoplasm of bone [facility]).   HISTORY OF PRESENT ILLNESS: Shirley Wells is a 79 y.o. female seen at the request of Dr. 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 She did have a margin broadly positive 1 mm from the posterior margin but did not require adjuvant chemo or radiation. She took antiestrogen therapy but had to discontinue this due to side effects and has been followed since without disease.  She recently presented with back pain and MRI imaging of lumbar spine showed diffuse osseous metastatic disease with chronic endplate deformity at L2, subacute pathologic fracture deformities at L1 in the right L3 vertebral bodies as well as multiple levels of spondylosis with moderate to severe spinal canal and neuroforaminal narrowing at L3-4 and L4-5 levels and moderate spinal canal and neural foraminal narrowing at L5-S1.  The patient had a PET scan on Monday of this week which confirms diffuse metastatic disease involving many bones throughout the skeleton, without evidence of soft tissue metastases in the neck chest abdomen or pelvis and stigmata of atherosclerotic disease.  She has been counseled on the rationale to proceed with a biopsy in the lesion in the iliac wing is planned to be the site for this.  Dr. Lisbeth Renshaw has personally reviewed her films, and is concerned about several levels specifically the T11 vertebral body appears to be the most at risk for further compromise, as well as her pelvis bilaterally.  She is seen today to discuss treatment options of palliative radiotherapy, additional systemic  therapy will be planned in the near future pending her biopsy results and she is scheduled to undergo her bone biopsy on 12/09/2020      PREVIOUS RADIATION THERAPY: No   PAST MEDICAL HISTORY:  Past Medical History:  Diagnosis Date  . CHICKENPOX, HX OF 03/21/2010   Qualifier: Diagnosis of  By: Marca Ancona RMA, Lucy    . 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 Dyslipidemia    intol of statin due to >3x increase LFTs  . GERD (gastroesophageal reflux disease)    hx  . Hypertension, essential   . Malignant neoplasm of upper-inner quadrant of left breast in female, estrogen receptor positive ([place]) 06/20/2017  . Osteoarthritis   . Stroke [facility])        PAST SURGICAL HISTORY: Past Surgical History:  Procedure Laterality Date  . CHOLECYSTECTOMY  1981  . MASTECTOMY W/ SENTINEL NODE BIOPSY Left 07/30/2017   Procedure: LEFT TOTAL MASTECTOMY WITH AXILLARY SENTINEL LYMPH NODE BIOPSY;  Surgeon: Rolm Bookbinder, MD;  Location: [place];  Service: General;  Laterality: Left;     FAMILY HISTORY:  Family History  Problem Relation Age of Onset  . Breast cancer Mother 28  . Arthritis Other   . Breast cancer Maternal Aunt      SOCIAL HISTORY:  reports that she has never smoked. She has never used smokeless tobacco. She reports that she does not drink alcohol and does not use  drugs.   ALLERGIES: Influenza vaccines, Pneumococcal vaccines, Simvastatin, Tetanus toxoids, and Zoster vaccine live   MEDICATIONS:  Current Outpatient Medications  Medication Sig Dispense Refill  . amLODipine (NORVASC) 5 MG tablet Take 1 tablet (5 mg total) by mouth daily. 90 tablet 3  . amoxicillin (AMOXIL) 500 MG tablet     . Ascorbic Acid (VITAMIN C) 1000 MG tablet Take 1,000 mg by mouth daily.    Marland Kitchen aspirin 81 MG tablet Take 81 mg by mouth 2 (two) times a week.    Marland Kitchen b complex vitamins tablet Take 1 tablet by mouth daily.    .  Cholecalciferol (VITAMIN D3) 1000 UNIT/SPRAY LIQD Take 2 sprays by mouth daily.    . clopidogrel (PLAVIX) 75 MG tablet Take 1 tablet (75 mg total) by mouth daily. 90 tablet 1  . furosemide (LASIX) 20 MG tablet Take 20 mg by mouth daily as needed for fluid.    . hydrALAZINE (APRESOLINE) 25 MG tablet Take 1 tablet (25 mg total) by mouth in the morning and at bedtime. 60 tablet 5  . HYDROcodone-acetaminophen (NORCO/VICODIN) 5-325 MG tablet Take 1-2 tablets by mouth every 4 (four) hours as needed for severe pain. 60 tablet 0  . lisinopril (ZESTRIL) 10 MG tablet     . loratadine (CLARITIN) 10 MG tablet Take 10 mg by mouth daily as needed for allergies.    . Menthol, Topical Analgesic, (BIOFREEZE EX) Apply 1 application topically daily as needed (muscle pain).    . mirtazapine (REMERON) 7.5 MG tablet Take 1 tablet (7.5 mg total) by mouth at bedtime. 90 tablet 3  . Multiple Vitamin (MULTIVITAMIN) capsule Take 1 capsule by mouth daily.    . ondansetron (ZOFRAN) 8 MG tablet Take 1 tablet (8 mg total) by mouth every 8 (eight) hours as needed for nausea or vomiting. 30 tablet 3  . SHARK CARTILAGE PO Take 100 mg by mouth daily.     No current facility-administered medications for this encounter.     REVIEW OF SYSTEMS: On review of systems, the patient reports that she is having terrible back pain, she has been using Norco 1 tab in the morning and 1 tab in the evening for pain relief which she states takes the edge off.  She reports that from time to time she may notice a shooting pain down her left leg and into the side of body.  She also states that she is having significant low pelvic pain and also notes significant pain along the underside of the left reconstructed breast region.  She states that taking a deep breath or coughing is quite uncomfortable as well as moving positions.  No other complaints are verbalized.    PHYSICAL EXAM:  Wt Readings from Last 3 Encounters:  12/01/20 139 lb 12.8 oz (63.4  kg)  11/29/20 141 lb 11.2 oz (64.3 kg)  11/29/20 140 lb 11.2 oz (63.8 kg)   Temp Readings from Last 3 Encounters:  12/01/20 98.3 F (36.8 C)  11/29/20 (!) 97.3 F (36.3 C) (Tympanic)  11/29/20 98.4 F (36.9 C) (Tympanic)   BP Readings from Last 3 Encounters:  12/01/20 125/61  11/29/20 136/64  11/29/20 (!) 146/72   Pulse Readings from Last 3 Encounters:  12/01/20 77  11/29/20 81  11/29/20 85   Pain Assessment Pain Score: 5  Pain Loc: Back (Lower back)/10  In general this is a well appearing Caucasian female in no acute distress.  She's alert and oriented x4 and appropriate throughout the examination. Cardiopulmonary  assessment is negative for acute distress and she exhibits normal effort.    ECOG = 1  0 - Asymptomatic (Fully active, able to carry on all predisease activities without restriction)  1 - Symptomatic but completely ambulatory (Restricted in physically strenuous activity but ambulatory and able to carry out work of a light or sedentary nature. For example, light housework, office work)  2 - Symptomatic, <50% in bed during the day (Ambulatory and capable of all self care but unable to carry out any work activities. Up and about more than 50% of waking hours)  3 - Symptomatic, >50% in bed, but not bedbound (Capable of only limited self-care, confined to bed or chair 50% or more of waking hours)  4 - Bedbound (Completely disabled. Cannot carry on any self-care. Totally confined to bed or chair)  5 - Death   Eustace Pen MM, Creech RH, Tormey DC, et al. [phone]). "Toxicity and response criteria of the [facility]". [place] Oncol. 5 (6): 649-55    LABORATORY DATA:  Lab Results  Component Value Date   WBC 6.8 11/29/2020   HGB 9.7 (L) 11/29/2020   HCT 29.6 (L) 11/29/2020   MCV 100.0 11/29/2020   PLT 275 11/29/2020   Lab Results  Component Value Date   NA 141 11/29/2020   K 5.1 11/29/2020   CL 109 11/29/2020   CO2 23 11/29/2020   Lab  Results  Component Value Date   ALT 21 11/29/2020   AST 37 11/29/2020   ALKPHOS 464 (H) 11/29/2020   BILITOT 0.3 11/29/2020      RADIOGRAPHY: MR Lumbar Spine Wo Contrast  Result Date: 11/11/2020 CLINICAL DATA:  Low back pain, > 6 wks Low back pain, increased fracture risk EXAM: MRI LUMBAR SPINE WITHOUT CONTRAST TECHNIQUE: Multiplanar, multisequence MR imaging of the lumbar spine was performed. No intravenous contrast was administered. COMPARISON:  09/12/2020. FINDINGS: Segmentation:  Standard. Alignment: Straightening of lordosis. Grade 1 L3-4 anterolisthesis. Grade 1 L4-5 retrolisthesis. Vertebrae: Diffusely T1/T2 hypointense appearance of the bone marrow with multifocal stir hyperintensities. Chronic inferior L2 endplate deformity with mild height loss. Likely subacute superior L1 endplate deformity with mild height loss and nondisplaced right lamina fracture at the L3 level. No significant retropulsion. Remaining vertebral body heights are grossly preserved. Conus medullaris and cauda equina: Conus extends to the L1 level. Conus and cauda equina appear normal. Disc levels: Multilevel desiccation and disc space loss. L1-2: Mild disc bulge with shallow right foraminal protrusion. Bilateral facet hypertrophy. Patent spinal canal and neural foramen. L2-3: Mild disc bulge and bilateral facet hypertrophy. Prominent ligamentum flavum. Patent spinal canal and left neural foramen. Mild right neural foraminal narrowing. L3-4: Grade 1 anterolisthesis with uncovered bulge. Ligamentum flavum thickening and bilateral facet hypertrophy. Severe spinal canal and mild-to-moderate bilateral neural foraminal narrowing. L4-5: Disc bulge with right subarticular annular fissuring. Ligamentum flavum thickening and bilateral facet hypertrophy. Moderate spinal canal and bilateral neural foraminal narrowing. L5-S1: Disc bulge grazing the exiting L5 nerve roots. Ligamentum flavum thickening and bilateral facet hypertrophy. Mild  to moderate spinal canal and neural foraminal narrowing. Paraspinal and other soft tissues: Left renal cysts. IMPRESSION: Diffuse osseous metastases. Chronic inferior L2 endplate deformity with mild height loss. Likely late subacute pathologic fracture deformities involving the superior L1 endplate and right L3 lamina. Multilevel spondylosis. Moderate to severe spinal canal and neural foraminal narrowing at the L3-4 and L4-5 levels. Mild to moderate spinal canal and neural foraminal narrowing at the L5-S1 level. These results will be  called to the ordering clinician or representative by the Radiologist Assistant, and communication documented in the PACS or [employer]. Electronically Signed   By: Primitivo Gauze M.D.   On: 11/11/2020 16:24   NM PET Image Initial (PI) Skull Base To Thigh  Result Date: 11/28/2020 CLINICAL DATA:  Subsequent treatment strategy for breast cancer. Osseous metastatic disease on recent lumbar spine MRI. EXAM: NUCLEAR MEDICINE PET SKULL BASE TO THIGH TECHNIQUE: 7.0 mCi F-18 FDG was injected intravenously. Full-ring PET imaging was performed from the skull base to thigh after the radiotracer. CT data was obtained and used for attenuation correction and anatomic localization. Fasting blood glucose: 100 a mg/dl COMPARISON:  None. FINDINGS: Mediastinal blood pool activity: SUV max 2.8 Liver activity: SUV max NA NECK: No hypermetabolic lymph nodes in the neck. Incidental CT findings: none CHEST: No hypermetabolic mediastinal or hilar nodes. No suspicious pulmonary nodules on the CT scan. Incidental CT findings: Atherosclerotic calcification is noted in the wall of the thoracic aorta. No suspicious pulmonary nodule or mass. ABDOMEN/PELVIS: No abnormal hypermetabolic activity within the liver, pancreas, adrenal glands, or spleen. No hypermetabolic lymph nodes in the abdomen or pelvis. Incidental CT findings: Small cyst noted left kidney. There is abdominal aortic atherosclerosis without  aneurysm. 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 SKELETON: Diffuse heterogeneous marrow uptake compatible with widespread bony metastatic involvement. Incidental CT findings: Bony anatomy is markedly heterogeneous compatible with widespread diffuse bony metastatic involvement. IMPRESSION: 1. Widespread and diffuse hypermetabolic bony metastases. 2. No evidence for soft tissue metastases in the neck, chest, abdomen, or pelvis. 3.  Aortic Atherosclerois (ICD10-170.0) Electronically Signed   By: Misty Stanley M.D.   On: 11/28/2020 11:58       IMPRESSION/PLAN: 1. 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  The patient's prior pathology, course and recent images were all reviewed.  Dr. Lisbeth Renshaw discusses the nature of metastatic disease to the bones with breast cancer and recommends consideration of palliative radiotherapy.  She is going to undergo a biopsy with interventional radiology next Friday, we have reached out to them and they have given [place] permission to move forward with radiotherapy for a few fractions prior to her pending biopsy.  Dr. Lisbeth Renshaw discusses that the sites that he would recommend treatment with palliative radiotherapy would include T10 and her bilateral pelvis.  After considering her complaints as well of the left rib region, there is a image based correlate by her PET scan that would also be feasible to treat.  Dr. Lisbeth Renshaw recommends a course of 10 fractions to these 3 locations, and we discussed the risks, benefits, short and long-term effects of radiotherapy. Written consent is obtained and placed in the chart, a copy was provided to the patient.  She will return for simulation either tomorrow or Monday of next week as we are trying to work out the schedule.  She will also follow-up with Dr. Burr Medico and interventional radiology next week. 2. Regarding her pain, the patient does find some relief with Norco, I did discuss with her that I would recommend  that she continue using this but that she could consider increasing the frequency of taking her medication if needed.  If she were to exhaust dosing frequency, consideration of oxycodone would be an option as well.  We will follow this expectantly.  In a visit lasting 60 minutes, greater than 50% of the time was spent face to face with Dr. Lisbeth Renshaw and via WebEx with myself discussing the patient's condition,  in preparation for the discussion, and coordinating the patient's care.   The above documentation reflects my direct findings during this shared patient visit. Please see the separate note by Dr. Lisbeth Renshaw on this date for the remainder of the patient's plan of care.    Carola Rhine, PAC

## 2020-12-02 ENCOUNTER — Ambulatory Visit: Payer: Medicare FFS | Admitting: Radiation Oncology

## 2020-12-02 ENCOUNTER — Encounter: Payer: Self-pay | Admitting: General Practice

## 2020-12-02 NOTE — Progress Notes (Signed)
[facility] Psychosocial Distress Screening Clinical Social Work  Clinical Social Work was referred by distress screening protocol.  The patient scored a 5 on the Psychosocial Distress Thermometer which indicates moderate distress. Clinical Social Worker contacted patient by phone to assess for distress and other psychosocial needs. Reports she is doing well, daughters are working on transportation needs, has family support.  Briefly described availability of [place] if needs arise in future, encouraged her to call us as needed.    ONCBCN DISTRESS SCREENING 12/01/2020  Screening Type Initial Screening  Distress experienced in past week (1-10) 5  Emotional problem type Nervousness/Anxiety;Adjusting to illness;Adjusting to appearance changes  Information Concerns Type   Physical Problem type Pain;Sleep/insomnia;Getting around;Loss of appetitie;Constipation/diarrhea  Physician notified of physical symptoms   Other Contact via phone    Clinical Social Worker follow up needed: No.  If yes, follow up plan:  Beverely Pace, Odem, LCSW Clinical Social Worker Phone:  [phone]

## 2020-12-04 ENCOUNTER — Ambulatory Visit
Admission: RE | Admit: 2020-12-04 | Discharge: 2020-12-04 | Disposition: A | Discharge status: Home / Self Care | Payer: Medicare FFS | Source: Ambulatory Visit | Attending: Nurse Practitioner | Admitting: Nurse Practitioner

## 2020-12-04 ENCOUNTER — Other Ambulatory Visit: Payer: Self-pay

## 2020-12-04 DIAGNOSIS — Z170 Estrogen receptor positive status [ER+]: Secondary | ICD-10-CM

## 2020-12-04 DIAGNOSIS — C50212 Malignant neoplasm of upper-inner quadrant of left female breast: Secondary | ICD-10-CM

## 2020-12-04 MED ORDER — GADOBENATE DIMEGLUMINE 529 MG/ML IV SOLN
12.0000 mL | Freq: Once | INTRAVENOUS | Status: AC | PRN
  Administered 2020-12-04: 12 mL via INTRAVENOUS

## 2020-12-05 ENCOUNTER — Other Ambulatory Visit: Payer: Self-pay | Admitting: Internal Medicine

## 2020-12-05 ENCOUNTER — Ambulatory Visit
Admission: RE | Admit: 2020-12-05 | Discharge: 2020-12-05 | Disposition: A | Discharge status: Home / Self Care | Payer: Medicare FFS | Source: Ambulatory Visit | Attending: Radiation Oncology | Admitting: Radiation Oncology

## 2020-12-05 DIAGNOSIS — Z79811 Long term (current) use of aromatase inhibitors: Secondary | ICD-10-CM | POA: Diagnosis not present

## 2020-12-05 DIAGNOSIS — C7951 Secondary malignant neoplasm of bone: Secondary | ICD-10-CM | POA: Diagnosis not present

## 2020-12-05 DIAGNOSIS — C50212 Malignant neoplasm of upper-inner quadrant of left female breast: Secondary | ICD-10-CM | POA: Insufficient documentation

## 2020-12-05 DIAGNOSIS — Z510 Encounter for antineoplastic radiation therapy: Secondary | ICD-10-CM | POA: Diagnosis present

## 2020-12-05 DIAGNOSIS — Z170 Estrogen receptor positive status [ER+]: Secondary | ICD-10-CM | POA: Diagnosis not present

## 2020-12-06 ENCOUNTER — Telehealth: Payer: Self-pay

## 2020-12-06 NOTE — Telephone Encounter (Signed)
Spoke with pt and made her aware of scan results and upcoming appointments

## 2020-12-06 NOTE — Telephone Encounter (Signed)
-----   Message from Alla Feeling, NP sent at 12/06/2020  9:51 AM EST ----- Please let Ms. Whiting know her MRI is negative for brain mets. 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 Please see how she is doing, let us know if any new questions. Review upcoming appointments.   Thanks, Regan Rakers, NP

## 2020-12-07 ENCOUNTER — Other Ambulatory Visit: Payer: Self-pay

## 2020-12-07 ENCOUNTER — Ambulatory Visit: Payer: Medicare FFS | Admitting: Radiation Oncology

## 2020-12-07 ENCOUNTER — Other Ambulatory Visit: Payer: Self-pay | Admitting: Radiology

## 2020-12-07 ENCOUNTER — Ambulatory Visit
Admission: RE | Admit: 2020-12-07 | Discharge: 2020-12-07 | Disposition: A | Discharge status: Home / Self Care | Payer: Medicare FFS | Source: Ambulatory Visit | Attending: Radiation Oncology | Admitting: Radiation Oncology

## 2020-12-07 DIAGNOSIS — Z510 Encounter for antineoplastic radiation therapy: Secondary | ICD-10-CM | POA: Diagnosis not present

## 2020-12-08 ENCOUNTER — Other Ambulatory Visit: Payer: Self-pay | Admitting: Radiology

## 2020-12-08 ENCOUNTER — Ambulatory Visit: Payer: Medicare FFS

## 2020-12-08 ENCOUNTER — Other Ambulatory Visit: Payer: Self-pay | Admitting: Student

## 2020-12-08 ENCOUNTER — Ambulatory Visit
Admission: RE | Admit: 2020-12-08 | Discharge: 2020-12-08 | Disposition: A | Discharge status: Home / Self Care | Payer: Medicare FFS | Source: Ambulatory Visit | Attending: Radiation Oncology | Admitting: Radiation Oncology

## 2020-12-08 DIAGNOSIS — Z510 Encounter for antineoplastic radiation therapy: Secondary | ICD-10-CM | POA: Diagnosis not present

## 2020-12-09 ENCOUNTER — Ambulatory Visit (HOSPITAL_COMMUNITY)
Admission: RE | Admit: 2020-12-09 | Discharge: 2020-12-09 | Disposition: A | Discharge status: Home / Self Care | Payer: Medicare FFS | Source: Ambulatory Visit | Attending: Hematology | Admitting: Hematology

## 2020-12-09 ENCOUNTER — Other Ambulatory Visit: Payer: Self-pay

## 2020-12-09 ENCOUNTER — Ambulatory Visit
Admission: RE | Admit: 2020-12-09 | Discharge: 2020-12-09 | Disposition: A | Discharge status: Home / Self Care | Payer: Medicare FFS | Source: Ambulatory Visit | Attending: Radiation Oncology | Admitting: Radiation Oncology

## 2020-12-09 ENCOUNTER — Ambulatory Visit: Payer: Medicare FFS

## 2020-12-09 DIAGNOSIS — C50212 Malignant neoplasm of upper-inner quadrant of left female breast: Secondary | ICD-10-CM

## 2020-12-09 DIAGNOSIS — Z170 Estrogen receptor positive status [ER+]: Secondary | ICD-10-CM | POA: Diagnosis not present

## 2020-12-09 DIAGNOSIS — Z510 Encounter for antineoplastic radiation therapy: Secondary | ICD-10-CM | POA: Diagnosis not present

## 2020-12-09 LAB — CBC WITH DIFFERENTIAL/PLATELET
Abs Immature Granulocytes: 0.1 10*3/uL — ABNORMAL HIGH (ref 0.00–0.07)
Basophils Absolute: 0.1 10*3/uL (ref 0.0–0.1)
Basophils Relative: 1 %
Eosinophils Absolute: 0.3 10*3/uL (ref 0.0–0.5)
Eosinophils Relative: 4 %
HCT: 32.6 % — ABNORMAL LOW (ref 36.0–46.0)
Hemoglobin: 9.8 g/dL — ABNORMAL LOW (ref 12.0–15.0)
Immature Granulocytes: 1 %
Lymphocytes Relative: 29 %
Lymphs Abs: 2.1 10*3/uL (ref 0.7–4.0)
MCH: 32.2 pg (ref 26.0–34.0)
MCHC: 30.1 g/dL (ref 30.0–36.0)
MCV: 107.2 fL — ABNORMAL HIGH (ref 80.0–100.0)
Monocytes Absolute: 0.5 10*3/uL (ref 0.1–1.0)
Monocytes Relative: 7 %
Neutro Abs: 4.3 10*3/uL (ref 1.7–7.7)
Neutrophils Relative %: 58 %
Platelets: 299 10*3/uL (ref 150–400)
RBC: 3.04 MIL/uL — ABNORMAL LOW (ref 3.87–5.11)
RDW: 15.6 % — ABNORMAL HIGH (ref 11.5–15.5)
WBC: 7.3 10*3/uL (ref 4.0–10.5)
nRBC: 0.3 % — ABNORMAL HIGH (ref 0.0–0.2)

## 2020-12-09 LAB — PROTIME-INR
INR: 1.1 (ref 0.8–1.2)
Prothrombin Time: 13.5 seconds (ref 11.4–15.2)

## 2020-12-09 MED ORDER — MIDAZOLAM HCL 2 MG/2ML IJ SOLN
INTRAMUSCULAR | Status: AC
  Filled 2020-12-09: qty 4

## 2020-12-09 MED ORDER — FENTANYL CITRATE (PF) 100 MCG/2ML IJ SOLN
INTRAMUSCULAR | Status: AC
  Filled 2020-12-09: qty 4

## 2020-12-09 MED ORDER — SODIUM CHLORIDE 0.9 % IV SOLN: INTRAVENOUS | Status: DC

## 2020-12-09 MED ORDER — MIDAZOLAM HCL 2 MG/2ML IJ SOLN
INTRAMUSCULAR | Status: AC | PRN
  Administered 2020-12-09: 1 mg via INTRAVENOUS

## 2020-12-09 MED ORDER — FENTANYL CITRATE (PF) 100 MCG/2ML IJ SOLN
INTRAMUSCULAR | Status: AC | PRN
  Administered 2020-12-09: 50 ug via INTRAVENOUS
  Administered 2020-12-09: 25 ug via INTRAVENOUS

## 2020-12-09 NOTE — Procedures (Signed)
Pre-procedure Diagnosis: Metastatic breast cancer Post-procedure Diagnosis: Same  Technically successful CT guided biopsy of infiltrative mixed lytic and sclerotic lesions involving the right ilium.  Complications: None Immediate EBL: None  Signed: Sandi Mariscal Pager: [phone] 12/09/2020, 11:57 AM

## 2020-12-09 NOTE — Discharge Instructions (Signed)
Needle Biopsy of the Bone, Care After This sheet gives you information about how to care for yourself after your procedure. Your health care provider may also give you more specific instructions. If you have problems or questions, contact your health care provider. What can I expect after the procedure? After the procedure, it is common to have soreness or tenderness at the puncture site. Follow these instructions at home: Puncture care  Follow instructions from your health care provider about how to take care of your puncture site. Make sure you: ? Wash your hands with soap and water before and after you change your bandage (dressing). If soap and water are not available, use hand sanitizer. ? Remove your dressing as told by your health care provider. In 24 hours.  Check your puncture site every day for signs of infection. Check for: ? Redness, swelling, or worsening pain. ? Fluid or blood. ? Warmth. ? Pus or a bad smell.   General instructions  Take over-the-counter and prescription medicines only as told by your health care provider.  Do not drive for 24 hours if you were given a sedative during your procedure.  Return to your normal activities as told by your health care provider.  Keep all follow-up visits as told by your health care provider. This is important. Contact a health care provider if:  You have redness, swelling, or worsening pain at the site of your puncture.  You have fluid or blood coming from your puncture site.  Your puncture site feels warm to the touch.  You have pus or a bad smell coming from your puncture site.  You have a fever.  You have persistent nausea or vomiting. Get help right away if:  You develop a rash.  You have difficulty breathing. Summary  After the procedure, it is common to have soreness or tenderness at the puncture site.  Follow instructions from your health care provider about how to take care of your puncture  site.  Contact a health care provider if you have any signs of infection.  Keep all follow-up visits as told by your health care provider. This is important. This information is not intended to replace advice given to you by your health care provider. Make sure you discuss any questions you have with your health care provider. Document Revised: 07/24/2018 Document Reviewed: 07/24/2018 Elsevier Patient Education  2021 [place].  Moderate Conscious Sedation, Adult, Care After This sheet gives you information about how to care for yourself after your procedure. Your health care provider may also give you more specific instructions. If you have problems or questions, contact your health care provider. What can I expect after the procedure? After the procedure, it is common to have:  Sleepiness for several hours.  Impaired judgment for several hours.  Difficulty with balance.  Vomiting if you eat too soon. Follow these instructions at home: For the time period you were told by your health care provider: For 24 hours  Rest.  Do not participate in activities where you could fall or become injured.  Do not drive or use machinery.  Do not drink alcohol.  Do not take sleeping pills or medicines that cause drowsiness.  Do not make important decisions or sign legal documents.  Do not take care of children on your own.      Eating and drinking  Follow the diet recommended by your health care provider.  Drink enough fluid to keep your urine pale yellow.  If you vomit: ?  Drink water, juice, or soup when you can drink without vomiting. ? Make sure you have little or no nausea before eating solid foods.   General instructions  Take over-the-counter and prescription medicines only as told by your health care provider.  Have a responsible adult stay with you for the time you are told. It is important to have someone help care for you until you are awake and alert.  Do not  smoke.  Keep all follow-up visits as told by your health care provider. This is important. Contact a health care provider if:  You are still sleepy or having trouble with balance after 24 hours.  You feel light-headed.  You keep feeling nauseous or you keep vomiting.  You develop a rash.  You have a fever.  You have redness or swelling around the IV site. Get help right away if:  You have trouble breathing.  You have new-onset confusion at home. Summary  After the procedure, it is common to feel sleepy, have impaired judgment, or feel nauseous if you eat too soon.  Rest after you get home. Know the things you should not do after the procedure.  Follow the diet recommended by your health care provider and drink enough fluid to keep your urine pale yellow.  Get help right away if you have trouble breathing or new-onset confusion at home. This information is not intended to replace advice given to you by your health care provider. Make sure you discuss any questions you have with your health care provider. Document Revised: 03/11/2020 Document Reviewed: 10/08/2019 Elsevier Patient Education  2021 [employer].

## 2020-12-09 NOTE — H&P (Signed)
Chief Complaint: Patient was seen in consultation today for bone lesion biopsy at the request of Feng,Yan  Referring Physician(s): Feng,Yan  Supervising Physician: Sandi Mariscal  Patient Status: [facility] - Out-pt  History of Present Illness: Shirley Wells is a 80 y.o. female with hx of breast cancer. Her workup is now concerning for metastatic spread to bone. PET and MR scans reviewed and she is referred for biopsy of iliac bone biopsy. PMHx, meds, labs, imaging, allergies reviewed. Feels well, no recent fevers, chills, illness. Has been NPO today as directed. Family at bedside.   Past Medical History:  Diagnosis Date  . CH[place]EN[place], HX OF 03/21/2010   Qualifier: Diagnosis of  By: Marca Ancona RMA, Lucy    . 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 Dyslipidemia    intol of statin due to >3x increase LFTs  . GERD (gastroesophageal reflux disease)    hx  . Hypertension, essential   . Malignant neoplasm of upper-inner quadrant of left breast in female, estrogen receptor positive ([place]) 06/20/2017  . Osteoarthritis   . Stroke [facility])     Past Surgical History:  Procedure Laterality Date  . CHOLECYSTECTOMY  1981  . MASTECTOMY W/ SENTINEL NODE BIOPSY Left 07/30/2017   Procedure: LEFT TOTAL MASTECTOMY WITH AXILLARY SENTINEL LYMPH NODE BIOPSY;  Surgeon: Rolm Bookbinder, MD;  Location: [place];  Service: General;  Laterality: Left;    Allergies: Influenza vaccines, Pneumococcal vaccines, Simvastatin, Tetanus toxoids, and Zoster vaccine live  Medications: Prior to Admission medications   Medication Sig Start Date End Date Taking? Authorizing Provider  acetaminophen (TYLENOL) 650 MG CR tablet Take 1,300 mg by mouth every 8 (eight) hours as needed for pain.   Yes [provider]  furosemide (LASIX) 20 MG tablet Take 20 mg by mouth daily as needed for fluid.   Yes [provider]  hydrALAZINE  (APRESOLINE) 25 MG tablet Take 1 tablet (25 mg total) by mouth in the morning and at bedtime. 03/15/20  Yes Burns, Claudina Lick, MD  HYDROcodone-acetaminophen (NORCO/VICODIN) 5-325 MG tablet Take 1-2 tablets by mouth every 4 (four) hours as needed for severe pain. 11/11/20  Yes Burns, Claudina Lick, MD  lisinopril (ZESTRIL) 10 MG tablet Take 10 mg by mouth daily. 09/05/20  Yes [provider]  loratadine (CLARITIN) 10 MG tablet Take 10 mg by mouth daily as needed for allergies.   Yes [provider]  Menthol, Topical Analgesic, (BIOFREEZE EX) Apply 1 application topically daily as needed (muscle pain).   Yes [provider]  Multiple Vitamin (MULTIVITAMIN) capsule Take 1 capsule by mouth daily. 11/02/16  Yes Burns, Claudina Lick, MD  ondansetron (ZOFRAN) 8 MG tablet Take 1 tablet (8 mg total) by mouth every 8 (eight) hours as needed for nausea or vomiting. 11/16/20  Yes Alla Feeling, NP  amLODipine (NORVASC) 5 MG tablet Take 1 tablet (5 mg total) by mouth daily. Patient not taking: No sig reported 02/03/19   Binnie Rail, MD  aspirin 81 MG tablet Take 81 mg by mouth 2 (two) times a week.    [provider]  clopidogrel (PLAVIX) 75 MG tablet TAKE 1 TABLET(75 MG) BY MOUTH DAILY Patient taking differently: Take 75 mg by mouth daily. 12/05/20   Binnie Rail, MD  mirtazapine (REMERON) 7.5 MG tablet Take 1 tablet (7.5 mg total) by mouth at bedtime. Patient not taking: No sig reported 11/17/20  Alla Feeling, NP     Family History  Problem Relation Age of Onset  . Breast cancer Mother 2  . Arthritis Other   . Breast cancer Maternal Aunt     Social History   Socioeconomic History  . Marital status: Divorced    Spouse name: Not on file  . Number of children: Not on file  . Years of education: Not on file  . Highest education level: Not on file  Occupational History  . Not on file  Tobacco Use  . Smoking status: Never Smoker  . Smokeless tobacco: Never Used   Substance and Sexual Activity  . Alcohol use: No    Alcohol/week: 0.0 standard drinks  . Drug use: No  . Sexual activity: Not on file  Other Topics Concern  . Not on file  Social History Narrative   Divorced  >40 yrs, single and lives alone.    Supportive daughters, 1 is a Therapist, sports at [employer]).    Patient enjoys gardening, dancing and caring for her grand children.   Social Determinants of Health   Financial Resource Strain: Not on file  Food Insecurity: Not on file  Transportation Needs: No Transportation Needs  . Lack of Transportation (Medical): No  . Lack of Transportation (Non-Medical): No  Physical Activity: Not on file  Stress: Not on file  Social Connections: Not on file     Review of Systems: A 12 point ROS discussed and pertinent positives are indicated in the HPI above.  All other systems are negative.  Review of Systems  Vital Signs: BP (!) 160/64   Pulse 83   Temp 98 F (36.7 C) (Oral)   Ht 5\' 2"  (1.575 m)   Wt 62.1 kg   SpO2 100%   BMI 25.06 kg/m   Physical Exam Constitutional:      Appearance: Normal appearance.  HENT:     Mouth/Throat:     Mouth: Mucous membranes are moist.     Pharynx: Oropharynx is clear.  Cardiovascular:     Rate and Rhythm: Normal rate and regular rhythm.     Heart sounds: Normal heart sounds.  Pulmonary:     Effort: Pulmonary effort is normal. No respiratory distress.     Breath sounds: Normal breath sounds.  Abdominal:     General: Abdomen is flat.     Palpations: Abdomen is soft.  Skin:    General: Skin is warm and dry.  Neurological:     General: No focal deficit present.     Mental Status: She is alert and oriented to person, place, and time.  Psychiatric:        Mood and Affect: Mood normal.        Thought Content: Thought content normal.        Judgment: Judgment normal.       Imaging: MR Brain W Wo Contrast  Result Date: 12/04/2020 CLINICAL DATA:  Metastatic breast cancer, staging EXAM:  MRI HEAD WITHOUT AND WITH CONTRAST TECHNIQUE: Multiplanar, multiecho pulse sequences of the brain and surrounding structures were obtained without and with intravenous contrast. CONTRAST:  42mL MULTIHANCE GADOBENATE DIMEGLUMINE 529 MG/ML IV SOLN COMPARISON:  None. FINDINGS: Brain: There is no acute infarction or intracranial hemorrhage. There is no intracranial mass, mass effect, or edema. There is no hydrocephalus or extra-axial fluid collection. Prominence of the ventricles and sulci reflects mild generalized parenchymal volume loss. Patchy foci of T2 hyperintensity in the supratentorial and pontine white matter are nonspecific but  probably reflect mild chronic microvascular ischemic changes. There is a small chronic right cerebellar infarct. No abnormal enhancement. Vascular: Major vessel flow voids at the skull base are preserved. Skull and upper cervical spine: Decreased upper cervical spine and skull base T1 marrow signal likely reflects known osseous metastatic disease. Sinuses/Orbits: Minor mucosal thickening.  Orbits are unremarkable. Other: Sella is partially empty. Minor left mastoid fluid opacification. IMPRESSION: No evidence of intracranial metastatic disease. 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 Mild chronic microvascular ischemic changes. Small chronic right cerebellar infarct. Electronically Signed   By: Macy Mis M.D.   On: 12/04/2020 19:41   MR Lumbar Spine Wo Contrast  Result Date: 11/11/2020 CLINICAL DATA:  Low back pain, > 6 wks Low back pain, increased fracture risk EXAM: MRI LUMBAR SPINE WITHOUT CONTRAST TECHNIQUE: Multiplanar, multisequence MR imaging of the lumbar spine was performed. No intravenous contrast was administered. COMPARISON:  09/12/2020. FINDINGS: Segmentation:  Standard. Alignment: Straightening of lordosis. Grade 1 L3-4 anterolisthesis. Grade 1 L4-5 retrolisthesis. Vertebrae: Diffusely T1/T2 hypointense appearance of the bone marrow with  multifocal stir hyperintensities. Chronic inferior L2 endplate deformity with mild height loss. Likely subacute superior L1 endplate deformity with mild height loss and nondisplaced right lamina fracture at the L3 level. No significant retropulsion. Remaining vertebral body heights are grossly preserved. Conus medullaris and cauda equina: Conus extends to the L1 level. Conus and cauda equina appear normal. Disc levels: Multilevel desiccation and disc space loss. L1-2: Mild disc bulge with shallow right foraminal protrusion. Bilateral facet hypertrophy. Patent spinal canal and neural foramen. L2-3: Mild disc bulge and bilateral facet hypertrophy. Prominent ligamentum flavum. Patent spinal canal and left neural foramen. Mild right neural foraminal narrowing. L3-4: Grade 1 anterolisthesis with uncovered bulge. Ligamentum flavum thickening and bilateral facet hypertrophy. Severe spinal canal and mild-to-moderate bilateral neural foraminal narrowing. L4-5: Disc bulge with right subarticular annular fissuring. Ligamentum flavum thickening and bilateral facet hypertrophy. Moderate spinal canal and bilateral neural foraminal narrowing. L5-S1: Disc bulge grazing the exiting L5 nerve roots. Ligamentum flavum thickening and bilateral facet hypertrophy. Mild to moderate spinal canal and neural foraminal narrowing. Paraspinal and other soft tissues: Left renal cysts. IMPRESSION: Diffuse osseous metastases. Chronic inferior L2 endplate deformity with mild height loss. Likely late subacute pathologic fracture deformities involving the superior L1 endplate and right L3 lamina. Multilevel spondylosis. Moderate to severe spinal canal and neural foraminal narrowing at the L3-4 and L4-5 levels. Mild to moderate spinal canal and neural foraminal narrowing at the L5-S1 level. These results will be called to the ordering clinician or representative by the Radiologist Assistant, and communication documented in the PACS or [employer].  Electronically Signed   By: Primitivo Gauze M.D.   On: 11/11/2020 16:24   NM PET Image Initial (PI) Skull Base To Thigh  Result Date: 11/28/2020 CLINICAL DATA:  Subsequent treatment strategy for breast cancer. Osseous metastatic disease on recent lumbar spine MRI. EXAM: NUCLEAR MEDICINE PET SKULL BASE TO THIGH TECHNIQUE: 7.0 mCi F-18 FDG was injected intravenously. Full-ring PET imaging was performed from the skull base to thigh after the radiotracer. CT data was obtained and used for attenuation correction and anatomic localization. Fasting blood glucose: 100 a mg/dl COMPARISON:  None. FINDINGS: Mediastinal blood pool activity: SUV max 2.8 Liver activity: SUV max NA NECK: No hypermetabolic lymph nodes in the neck. Incidental CT findings: none CHEST: No hypermetabolic mediastinal or hilar nodes. No suspicious pulmonary nodules on the CT scan. Incidental CT findings: Atherosclerotic calcification is noted in the  wall of the thoracic aorta. No suspicious pulmonary nodule or mass. ABDOMEN/PELVIS: No abnormal hypermetabolic activity within the liver, pancreas, adrenal glands, or spleen. No hypermetabolic lymph nodes in the abdomen or pelvis. Incidental CT findings: Small cyst noted left kidney. There is abdominal aortic atherosclerosis without aneurysm. 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 SKELETON: Diffuse heterogeneous marrow uptake compatible with widespread bony metastatic involvement. Incidental CT findings: Bony anatomy is markedly heterogeneous compatible with widespread diffuse bony metastatic involvement. IMPRESSION: 1. Widespread and diffuse hypermetabolic bony metastases. 2. No evidence for soft tissue metastases in the neck, chest, abdomen, or pelvis. 3.  Aortic Atherosclerois (ICD10-170.0) Electronically Signed   By: Misty Stanley M.D.   On: 11/28/2020 11:58    Labs:  CBC: Recent Labs    03/14/20 1203 09/12/20 1157 11/11/20 1506 11/29/20 1130  WBC 6.9 9.2  7.0 6.8  HGB 13.4 11.7* 10.3* 9.7*  HCT 39.7 35.1* 30.8* 29.6*  PLT 212.0 279.0 298.0 275    COAGS: No results for input(s): INR, APTT in the last 8760 hours.  BMP: Recent Labs    03/14/20 1203 09/12/20 1157 11/11/20 1506 11/29/20 1130  NA 140 142 140 141  K 5.2 No hemolysis seen* 5.3 No hemolysis seen* 4.7 5.1  CL 105 108 109 109  CO2 28 25 22 23   GLUCOSE 96 88 98 140*  BUN 24* 25* 49* 36*  CALCIUM 9.7 11.0* 10.4  11.0* 10.1  CREATININE 1.71* 1.74* 1.67* 1.85*  GFRNONAA  --   --   --  27*    LIVER FUNCTION TESTS: Recent Labs    03/14/20 1203 09/12/20 1157 11/11/20 1506 11/29/20 1130  BILITOT 0.5 0.4 0.3 0.3  AST 31 46* 32 37  ALT 21 33 21 21  ALKPHOS 210* 384* 397* 464*  PROT 7.5 7.6 7.6 7.5  ALBUMIN 4.0 3.9 3.8 3.0*    TUMOR MARKERS: No results for input(s): AFPTM, CEA, CA199, CHROMGRNA in the last 8760 hours.  Assessment and Plan: Hx breast cancer Multiple osseous lesions concerning for metastatic spread. After review of imaging, plan for image guided biopsy of iliac bone lesion. Labs ok Risks and benefits of iliac bone lesion biopsy was discussed with the patient and/or patient's family including, but not limited to bleeding, infection, damage to adjacent structures or low yield requiring additional tests.  All of the questions were answered and there is agreement to proceed.  Consent signed and in chart.    Thank you for this interesting consult.  I greatly enjoyed meeting Shirley Wells and look forward to participating in their care.  A copy of this report was sent to the requesting provider on this date.  Electronically Signed: Ascencion Dike, PA-C 12/09/2020, 10:29 AM   I spent a total of 20 minutes in face to face in clinical consultation, greater than 50% of which was counseling/coordinating care for iliac bone biopsy

## 2020-12-12 ENCOUNTER — Ambulatory Visit: Payer: Medicare FFS

## 2020-12-13 ENCOUNTER — Ambulatory Visit: Payer: Medicare FFS

## 2020-12-13 ENCOUNTER — Ambulatory Visit
Admission: RE | Admit: 2020-12-13 | Discharge: 2020-12-13 | Disposition: A | Discharge status: Home / Self Care | Payer: Medicare FFS | Source: Ambulatory Visit | Attending: Radiation Oncology | Admitting: Radiation Oncology

## 2020-12-13 DIAGNOSIS — Z510 Encounter for antineoplastic radiation therapy: Secondary | ICD-10-CM | POA: Diagnosis not present

## 2020-12-14 ENCOUNTER — Ambulatory Visit: Payer: Medicare FFS

## 2020-12-14 ENCOUNTER — Other Ambulatory Visit: Payer: Self-pay

## 2020-12-14 ENCOUNTER — Ambulatory Visit
Admission: RE | Admit: 2020-12-14 | Discharge: 2020-12-14 | Disposition: A | Discharge status: Home / Self Care | Payer: Medicare FFS | Source: Ambulatory Visit | Attending: Radiation Oncology | Admitting: Radiation Oncology

## 2020-12-14 DIAGNOSIS — Z510 Encounter for antineoplastic radiation therapy: Secondary | ICD-10-CM | POA: Diagnosis not present

## 2020-12-14 NOTE — Progress Notes (Signed)
[facility]   [phone] [phone]   Clinic Follow up Note   Patient Care Team: Binnie Rail, MD as PCP - General (Internal Medicine) Garvin Fila, MD as Consulting Physician (Neurology) Rolm Bookbinder, MD as Consulting Physician (General Surgery) Truitt Merle, MD as Consulting Physician (Hematology) Kyung Rudd, MD as Consulting Physician (Radiation Oncology)  Date of Service:  12/16/2020  CHIEF COMPLAINT: F/u of metastatic breast cancer   SUMMARY OF ONCOLOGIC HISTORY: Oncology History Overview Note  Cancer Staging Malignant neoplasm of upper-inner quadrant of left breast in female, estrogen receptor positive ([place]) Staging form: Breast, AJCC 8th Edition - Clinical stage from 06/18/2017: Stage IB (cT2, cN0, cM0, G2, ER: Positive, PR: Positive, HER2: Negative) - Signed by Truitt Merle, MD on 06/24/2017 - Pathologic stage from 07/30/2017: Stage IA (pT2, pN0, cM0, G2, ER: Positive, PR: Positive, HER2: Negative, Oncotype DX score: 23) - Signed by Truitt Merle, MD on 08/23/2017     Malignant neoplasm of upper-inner quadrant of left breast in female, estrogen receptor positive ([place])  06/17/2017 Mammogram   [place] and MM diagnostic Breast Tomo Bilateral  IMPRESSION: 1. 3.4 palpable mass in the 10 o'clock position of the left breast is highly suspicious for malignancy. 2. No evidence of malignancy in the right breast. 3. Ultrasound of the left axilla shows normal sized axillary lymph nodes.   06/18/2017 Initial Biopsy   Diagnosis 06/18/17 Breast, left, needle core biopsy, 10:00 o'clock - INVASIVE MAMMARY CARCINOMA. At least G2  The malignant cells are negative for E-Cadherin, supporting a lobular phenotype.   06/18/2017 Initial Diagnosis   Malignant neoplasm of upper-inner quadrant of left breast in female, estrogen receptor positive ([place])   06/18/2017 Receptors her2   ER 95%+, PR 25%+, both strong staining  HER2- Ki67 30%    07/30/2017 Surgery    LEFT TOTAL MASTECTOMY WITH AXILLARY SENTINEL LYMPH NODE BIOPSY by Dr. Donne Hazel on 07/30/17   07/30/2017 Pathology Results   Diagnosis 07/30/17 1. Breast, simple mastectomy, Left Total - INVASIVE LOBULAR CARCINOMA, GRADE 2, SPANNING 3.5 CM. - LOBULAR CARCINOMA IN SITU. - INVASIVE CARCINOMA IS BROADLY 0.1 CM FROM POSTERIOR MARGIN. - PERINEURAL INVASION PRESENT. - SEE ONCOLOGY TABLE. 2. Lymph node, sentinel, biopsy, Left Axillary - ONE OF ONE LYMPH NODES NEGATIVE FOR CARCINOMA (0/1). 3. Lymph node, sentinel, biopsy, Left - ONE OF ONE LYMPH NODES NEGATIVE FOR CARCINOMA (0/1).   07/30/2017 Oncotype testing   Oncotype 07/30/17 Recurrence score is 23, an intermediate risk With a 10-year risk of recurrence at 15% with tamoxifen alone.     11/11/2020 Imaging   MRI Lumbar Spine  IMPRESSION: Diffuse osseous metastases. Chronic inferior L2 endplate deformity with mild height loss.   Likely late subacute pathologic fracture deformities involving the superior L1 endplate and right L3 lamina.   Multilevel spondylosis. Moderate to severe spinal canal and neural foraminal narrowing at the L3-4 and L4-5 levels.   Mild to moderate spinal canal and neural foraminal narrowing at the L5-S1 level.   11/28/2020 PET scan   IMPRESSION: 1. Widespread and diffuse hypermetabolic bony metastases. 2. No evidence for soft tissue metastases in the neck, chest, abdomen, or pelvis. 3.  Aortic Atherosclerois (ICD10-170.0)     11/29/2020 -  Chemotherapy   Zometa starting 11/29/20. Given her CKD will only give when she has hypercalcemia.      12/04/2020 Imaging   MRI Brain  IMPRESSION: No evidence of intracranial metastatic disease.   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  Mild chronic microvascular ischemic changes. Small chronic right cerebellar infarct.     12/07/2020 - 12/21/2020 Radiation Therapy   palliative RT to left hip with Dr Lisbeth Renshaw on 12/07/20-12/21/20   12/09/2020 Pathology Results    FINAL MICROSCOPIC DIAGNOSIS:   A. BONE, BIOPSY:  - Metastatic carcinoma.  - See comment.   COMMENT:  The morphology is compatible with metastatic lobular breast carcinoma.  Estrogen receptor, progesterone receptor and HER2/neu will be performed.   ADDENDUM:   PROGNOSTIC INDICATOR RESULTS:   Immunohistochemical and morphometric analysis performed manually   The tumor cells are EQUIVOCAL for Her2 (2+).  Her2 by FISH will be performed and the results will be reported  separately.   Estrogen Receptor:       POSITIVE, 50%, MODERATE-STRONG STAINING  Progesterone Receptor:   POSITIVE, 15%, MODERATE-STRONG STAINING    11/2020 -  Anti-estrogen oral therapy   First-line Anti-estrogen therapy Anastrozole 54m daily starting in 11/2020 and Ibrance starting in 12/2020    12/2020 -  Chemotherapy   First-line Anti-estrogen therapy Anastrozole starting in 11/2020 and Ibrance 3 weeks on/1 weeks off starting in 12/2020        CURRENT THERAPY:  Zometa starting 11/29/20. Given her CKD will only give when she has hypercalcemia.   Palliative RT to left hip with Dr MLisbeth Renshawon 12/07/20-12/21/20 Pending First-line Anti-estrogen therapy Anastrozole 160mdaily starting end of 11/2020 and Ibrance 3 weeks on/1 weeks off starting beginning of 12/2020  INTERVAL HISTORY:  Shirley Wells here for a follow up. She presents to the clinic with her daughter. She notes she is tolerating target RT well. She notes her RT is helping her pain. She is taking Norco 1 tab 2-3 times a day and extra strength Tylenol. She notes she is taking Mirtazapine BID to help. She notes with increased dose she has been sleeping better but appetite is the same. I reviewed her medication list with her.    REVIEW OF SYSTEMS:   Constitutional: Denies fevers, chills or abnormal weight loss (+) Fatigue (+) Low appetite  Eyes: Denies blurriness of vision Ears, nose, mouth, throat, and face: Denies mucositis or sore throat Respiratory: Denies  cough, dyspnea or wheezes Cardiovascular: Denies palpitation, chest discomfort or lower extremity swelling Gastrointestinal:  Denies nausea, heartburn or change in bowel habits Skin: Denies abnormal skin rashes MSK: (+) Left hip pain, ribcage pain  Lymphatics: Denies new lymphadenopathy or easy bruising Neurological:Denies numbness, tingling or new weaknesses Behavioral/Psych: Mood is stable, no new changes  All other systems were reviewed with the patient and are negative.  MEDICAL HISTORY:  Past Medical History:  Diagnosis Date  . CHICKENPOX, HX OF 03/21/2010   Qualifier: Diagnosis of  By: BrMarca AnconaMA, Lucy    . 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 Dyslipidemia    intol of statin due to >3x increase LFTs  . GERD (gastroesophageal reflux disease)    hx  . Hypertension, essential   . Malignant neoplasm of upper-inner quadrant of left breast in female, estrogen receptor positive ([place]/2018  . Osteoarthritis   [facility]    SURGICAL HISTORY: Past Surgical History:  Procedure Laterality Date  . CHOLECYSTECTOMY  1981  . MASTECTOMY W/ SENTINEL NODE BIOPSY Left 07/30/2017   Procedure: LEFT TOTAL MASTECTOMY WITH AXILLARY SENTINEL LYMPH NODE BIOPSY;  Surgeon: WaRolm BookbinderMD;  [facility]: [facility];  Laterality: Left;  I have reviewed the social history and family history with the patient and they are unchanged from previous note.  ALLERGIES:  is allergic to influenza vaccines, pneumococcal vaccines, simvastatin, tetanus toxoids, and zoster vaccine live.  MEDICATIONS:  Current Outpatient Medications  Medication Sig Dispense Refill  . anastrozole (ARIMIDEX) 1 MG tablet Take 1 tablet (1 mg total) by mouth daily. 30 tablet 3  . sodium polystyrene (KAYEXALATE) powder Take by mouth once for 1 dose. 15 g 0  . acetaminophen (TYLENOL) 650 MG CR tablet Take 1,300 mg by mouth  every 8 (eight) hours as needed for pain.    Marland Kitchen amLODipine (NORVASC) 5 MG tablet Take 1 tablet (5 mg total) by mouth daily. (Patient not taking: No sig reported) 90 tablet 3  . aspirin 81 MG tablet Take 81 mg by mouth 2 (two) times a week.    . clopidogrel (PLAVIX) 75 MG tablet TAKE 1 TABLET(75 MG) BY MOUTH DAILY (Patient taking differently: Take 75 mg by mouth daily.) 90 tablet 1  . furosemide (LASIX) 20 MG tablet Take 20 mg by mouth daily as needed for fluid.    . hydrALAZINE (APRESOLINE) 25 MG tablet Take 1 tablet (25 mg total) by mouth in the morning and at bedtime. 60 tablet 5  . HYDROcodone-acetaminophen (NORCO/VICODIN) 5-325 MG tablet Take 1-2 tablets by mouth every 4 (four) hours as needed for severe pain. 60 tablet 0  . lisinopril (ZESTRIL) 10 MG tablet Take 10 mg by mouth daily.    Marland Kitchen loratadine (CLARITIN) 10 MG tablet Take 10 mg by mouth daily as needed for allergies.    . Menthol, Topical Analgesic, (BIOFREEZE EX) Apply 1 application topically daily as needed (muscle pain).    . mirtazapine (REMERON) 7.5 MG tablet Take 1 tablet (7.5 mg total) by mouth at bedtime. (Patient not taking: No sig reported) 90 tablet 3  . Multiple Vitamin (MULTIVITAMIN) capsule Take 1 capsule by mouth daily.    . ondansetron (ZOFRAN) 8 MG tablet Take 1 tablet (8 mg total) by mouth every 8 (eight) hours as needed for nausea or vomiting. 30 tablet 3   No current facility-administered medications for this visit.    PHYSICAL EXAMINATION: ECOG PERFORMANCE STATUS: 3 - Symptomatic, >50% confined to bed  Vitals:   12/16/20 1052  BP: (!) 157/68  Pulse: 78  Resp: 16  Temp: (!) 97.3 F (36.3 C)  SpO2: 100%   Filed Weights   12/16/20 1052  Weight: 139 lb 8 oz (63.3 kg)     Due to COVID19 we will limit examination to appearance. Patient had no complaints.  GENERAL:alert, no distress and comfortable SKIN: skin color normal, no rashes or significant lesions EYES: normal, Conjunctiva are pink and  non-injected, sclera clear  NEURO: alert & oriented x 3 with fluent speech   LABORATORY DATA:  I have reviewed the data as listed CBC Latest Ref Rng & Units 12/16/2020 12/09/2020 11/29/2020  WBC 4.0 - 10.5 K/uL 4.5 7.3 6.8  Hemoglobin 12.0 - 15.0 g/dL 9.7(L) 9.8(L) 9.7(L)  Hematocrit 36.0 - 46.0 % 30.9(L) 32.6(L) 29.6(L)  Platelets 150 - 400 K/uL 247 299 275     CMP Latest Ref Rng & Units 12/16/2020 11/29/2020 11/11/2020  Glucose 70 - 99 mg/dL 109(H) 140(H) -  BUN 8 - 23 mg/dL 33(H) 36(H) -  Creatinine 0.44 - 1.00 mg/dL 1.65(H) 1.85(H) -  Sodium 135 - 145 mmol/L 142 141 -  Potassium 3.5 - 5.1 mmol/L 5.5(H) 5.1 -  Chloride 98 - 111  mmol/L 111 109 -  CO2 22 - 32 mmol/L 19(L) 23 -  Calcium 8.9 - 10.3 mg/dL 8.9 10.1 10.4  Total Protein 6.5 - 8.1 g/dL 7.7 7.5 -  Total Bilirubin 0.3 - 1.2 mg/dL 0.3 0.3 -  Alkaline Phos 38 - 126 U/L 438(H) 464(H) -  AST 15 - 41 U/L 44(H) 37 -  ALT 0 - 44 U/L 23 21 -      RADIOGRAPHIC STUDIES: I have personally reviewed the radiological images as listed and agreed with the findings in the report. No results found.   ASSESSMENT & PLAN:  Shirley Wells is a 80 y.o. female with   1. 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 She was treated with left total mastectomy. She did not require radiation and declined antiestrogen therapy due to concerns of side effects. She lost follow up after 07/2017.  -After 6 months of left hip and ribcage pain she was found to have diffuse bone metastasis on 11/28/20 PET. This was confirmed on 12/09/20 bone biopsy which showed metastatic carcinoma compatible from prior breast cancer, ER50%+, PR 15% positive and HER2 equivocal, waiting on FISH results. I personally reviewed with patient today.  -Given her metastatic breast cancer, her cancer is  no longer curable but still treatable. I recommend first-line treatment with antiestrogen therapy Anastrozole and Ibrance if Her2-. I reviewed side effects with her in great detail. Plan to start Anastrozole first next week after she complete palliative radiation.  -I discussed if cancer is found to be HER2(+) I would recommend different treatment, likely THP or Enhertu.  -Labs reviewed, Hg 9.7, K 5.5, BG 109, BUN 33, Cr 1.65, albumin 3.2, AST 44, Alk Phos 438.  -Phone call in 3 weeks to check on her tolerance to anastrozole, and plan to start Ibrance then -Lab and OV in 5 weeks    2. Diffuse bone metastasis, L1/L3 fracture, left hip and rib cage pain, hypercalcemia -She presented with significant left hip and rib cage pain for 6 months, lumbar MRI showed diffuse bone metastasis and subacute pathologic fracture of L1 and L3.  -Her 11/28/20 PET scan showed diffuse bone metastasis in pelvis, femur, spine, and ribs.  No visceral metastasis. Her bone biopsy from 12/09/20 showed metastatic carcinoma compatible from prior breast cancer.  -Due to her severe pain in the left hip, she started palliative RT with Dr Lisbeth Renshaw on 12/07/20-12/21/20. Radiation is mostly helping her pain.  -She started Zometa on 11/29/20, but given her CKD will only give when she has hypercalcemia.  -She is not having significant back pain or tenderness from her lumbar fracture, Dr. Pascal Lux does not recommend osteocool or kyphoplasty at this point. -For her rib cage and left hip pain she is on Noroc 2-3 tabs a day. She also uses Tylenol. To help better control her pain, she can use 1-2 tabs every 4 hours, I refilled today (12/16/20). She is willing to try.    3. Anorexia, weight loss, nausea, weakness/fatigue  -secondary to #1, will proceed with full staging work up to r/o distant metastasis including brain -I recommend to ambulate with a cane at home due to her back pain and generalized weakness. I will given PT referral to help her increase  activity level.  -She has increased Mirtazapine BID which has helped her sleep but not her appetite.  She can increase dose to 17m.  -Her main intake is Ensure 2-3 times a day.  -continue zofran as needed   4.Chronic comorbidities:Pre-DM, CKD stage III, history of CVA, HL, osteopenia -On amlodipine, clopidogrel, hydralazine, aspirin, furosemide, lisinopril, -She has had osteoporosis as seen on 2016 and 08/2017 DEXA. Her 08/2020 DEXA improved to osteopenia. Due to hypercalcemia, will hold calcium for now, she can continue vitamin D -Continue to F/u with PCP Dr. SBilley Gosling-Her Cr at 1.65 today (12/16/20). I recommend she remain hydrated.    5. Hyperkalemia  -K at 5.5 today (1/21/220 -I discussed this could be related to her CKD and possibility her lisinopril.  -She can discuss holding or stopping lisinopril with her PCP.  -I called in Kayexalate today (12/16/20). I also advised her to reduce potassium rich foods.   6. Anemia  -Inially mild anemia in 08/2020, now moderate in the past 2 weeks. This is likely related to bone mets.  -Hg at 9.7 today (12/16/20). I recommend she start oral iron.    PLAN:  -Continue RT until 12/21/20 -I refilled Norco and called in Kayexalate and Ibrance and Anastrozole today, plan to start Anastrozole next week after she completes RT -will start Ibrance in about 3 weeks, due to her advanced age, CKD and poor PS, will start at 1045mdaily, 3 weeks on, one week off for first cycle  -f/u on HER2 FISH result   -Send PT referral  -Phone call in 3 weeks -Lab and F/u on 2/28   No problem-specific Assessment & Plan notes found for this encounter.   No orders of the defined types were placed in this encounter.  All questions were answered. The patient knows to call the clinic with any problems, questions or concerns. No barriers to learning was detected. The total time spent in the appointment was 40 minutes.     YaTruitt MerleMD 12/16/2020   I, AmJoslyn Devonam acting as scribe for YaTruitt MerleMD.   I have reviewed the above documentation for accuracy and completeness, and I agree with the above.

## 2020-12-15 ENCOUNTER — Ambulatory Visit
Admission: RE | Admit: 2020-12-15 | Discharge: 2020-12-15 | Disposition: A | Discharge status: Home / Self Care | Payer: Medicare FFS | Source: Ambulatory Visit | Attending: Radiation Oncology | Admitting: Radiation Oncology

## 2020-12-15 ENCOUNTER — Ambulatory Visit: Payer: Medicare FFS

## 2020-12-15 DIAGNOSIS — Z510 Encounter for antineoplastic radiation therapy: Secondary | ICD-10-CM | POA: Diagnosis not present

## 2020-12-16 ENCOUNTER — Other Ambulatory Visit: Payer: Self-pay

## 2020-12-16 ENCOUNTER — Other Ambulatory Visit: Payer: Self-pay | Admitting: Hematology

## 2020-12-16 ENCOUNTER — Inpatient Hospital Stay: Payer: Medicare FFS

## 2020-12-16 ENCOUNTER — Other Ambulatory Visit: Payer: Medicare FFS

## 2020-12-16 ENCOUNTER — Inpatient Hospital Stay: Payer: Medicare FFS | Admitting: Hematology

## 2020-12-16 ENCOUNTER — Ambulatory Visit
Admission: RE | Admit: 2020-12-16 | Discharge: 2020-12-16 | Disposition: A | Discharge status: Home / Self Care | Payer: Medicare FFS | Source: Ambulatory Visit | Attending: Radiation Oncology | Admitting: Radiation Oncology

## 2020-12-16 ENCOUNTER — Encounter: Payer: Self-pay | Admitting: Hematology

## 2020-12-16 ENCOUNTER — Ambulatory Visit: Payer: Medicare FFS

## 2020-12-16 VITALS — BP 157/68 | HR 78 | Temp 97.3°F | Resp 16 | Ht 62.0 in | Wt 139.5 lb

## 2020-12-16 DIAGNOSIS — I10 Essential (primary) hypertension: Secondary | ICD-10-CM | POA: Diagnosis not present

## 2020-12-16 DIAGNOSIS — C50212 Malignant neoplasm of upper-inner quadrant of left female breast: Secondary | ICD-10-CM | POA: Diagnosis not present

## 2020-12-16 DIAGNOSIS — Z510 Encounter for antineoplastic radiation therapy: Secondary | ICD-10-CM | POA: Diagnosis not present

## 2020-12-16 DIAGNOSIS — Z170 Estrogen receptor positive status [ER+]: Secondary | ICD-10-CM

## 2020-12-16 LAB — CBC WITH DIFFERENTIAL/PLATELET
Abs Immature Granulocytes: 0.06 10*3/uL (ref 0.00–0.07)
Basophils Absolute: 0 10*3/uL (ref 0.0–0.1)
Basophils Relative: 1 %
Eosinophils Absolute: 0.4 10*3/uL (ref 0.0–0.5)
Eosinophils Relative: 9 %
HCT: 30.9 % — ABNORMAL LOW (ref 36.0–46.0)
Hemoglobin: 9.7 g/dL — ABNORMAL LOW (ref 12.0–15.0)
Immature Granulocytes: 1 %
Lymphocytes Relative: 15 %
Lymphs Abs: 0.7 10*3/uL (ref 0.7–4.0)
MCH: 33 pg (ref 26.0–34.0)
MCHC: 31.4 g/dL (ref 30.0–36.0)
MCV: 105.1 fL — ABNORMAL HIGH (ref 80.0–100.0)
Monocytes Absolute: 0.4 10*3/uL (ref 0.1–1.0)
Monocytes Relative: 9 %
Neutro Abs: 2.9 10*3/uL (ref 1.7–7.7)
Neutrophils Relative %: 65 %
Platelets: 247 10*3/uL (ref 150–400)
RBC: 2.94 MIL/uL — ABNORMAL LOW (ref 3.87–5.11)
RDW: 15.4 % (ref 11.5–15.5)
WBC: 4.5 10*3/uL (ref 4.0–10.5)
nRBC: 0 % (ref 0.0–0.2)

## 2020-12-16 LAB — COMPREHENSIVE METABOLIC PANEL
ALT: 23 U/L (ref 0–44)
AST: 44 U/L — ABNORMAL HIGH (ref 15–41)
Albumin: 3.2 g/dL — ABNORMAL LOW (ref 3.5–5.0)
Alkaline Phosphatase: 438 U/L — ABNORMAL HIGH (ref 38–126)
Anion gap: 12 (ref 5–15)
BUN: 33 mg/dL — ABNORMAL HIGH (ref 8–23)
CO2: 19 mmol/L — ABNORMAL LOW (ref 22–32)
Calcium: 8.9 mg/dL (ref 8.9–10.3)
Chloride: 111 mmol/L (ref 98–111)
Creatinine, Ser: 1.65 mg/dL — ABNORMAL HIGH (ref 0.44–1.00)
GFR, Estimated: 31 mL/min — ABNORMAL LOW (ref >60)
Glucose, Bld: 109 mg/dL — ABNORMAL HIGH (ref 70–99)
Potassium: 5.5 mmol/L — ABNORMAL HIGH (ref 3.5–5.1)
Sodium: 142 mmol/L (ref 135–145)
Total Bilirubin: 0.3 mg/dL (ref 0.3–1.2)
Total Protein: 7.7 g/dL (ref 6.5–8.1)

## 2020-12-16 LAB — SURGICAL PATHOLOGY

## 2020-12-16 MED ORDER — SODIUM POLYSTYRENE SULFONATE PO POWD: Freq: Once | ORAL | 0 refills | Status: AC

## 2020-12-16 MED ORDER — PALBOCICLIB 100 MG PO TABS: 100.0000 mg | ORAL_TABLET | Freq: Every day | ORAL | 0 refills | Status: DC

## 2020-12-16 MED ORDER — ANASTROZOLE 1 MG PO TABS: 1.0000 mg | ORAL_TABLET | Freq: Every day | ORAL | 3 refills | Status: DC

## 2020-12-16 MED ORDER — HYDROCODONE-ACETAMINOPHEN 5-325 MG PO TABS: 1.0000 | ORAL_TABLET | ORAL | 0 refills | Status: DC | PRN

## 2020-12-19 ENCOUNTER — Ambulatory Visit: Payer: Medicare FFS

## 2020-12-19 ENCOUNTER — Ambulatory Visit
Admission: RE | Admit: 2020-12-19 | Discharge: 2020-12-19 | Disposition: A | Discharge status: Home / Self Care | Payer: Medicare FFS | Source: Ambulatory Visit | Attending: Radiation Oncology | Admitting: Radiation Oncology

## 2020-12-19 ENCOUNTER — Telehealth: Payer: Self-pay | Admitting: Pharmacist

## 2020-12-19 DIAGNOSIS — Z510 Encounter for antineoplastic radiation therapy: Secondary | ICD-10-CM | POA: Diagnosis not present

## 2020-12-19 NOTE — Telephone Encounter (Signed)
Oral Oncology Pharmacist Encounter   Received notification from [facility] that prior authorization for [name] is required.  PA submitted on CoverMyMeds Key [id number] Status is pending  Oral Oncology Clinic will continue to follow.   Leron Croak, PharmD, BCPS Hematology/Oncology Clinical Pharmacist [facility] [phone] 12/19/2020 9:52 AM

## 2020-12-19 NOTE — Telephone Encounter (Signed)
Oral Oncology Pharmacist Encounter   Prior Authorization for Ibrance (palbociclib) has been approved.     PA# [id number] Effective dates: 12/19/2020 through 06/17/2021  Patient's copay is $7829.56   Oral Oncology Clinic will continue to follow for medication assistance to help with out of pocket costs.  Leron Croak, PharmD, BCPS Hematology/Oncology Clinical Pharmacist [facility] [phone] 12/19/2020 10:04 AM

## 2020-12-19 NOTE — Telephone Encounter (Signed)
Oral Oncology Pharmacist Encounter  Received new prescription for Ibrance (palbociclib) for the treatment of metastatic ER/PR positive, HER-2 negative breast cancer in conjunction with anastrozole, planned duration until disease progression or unacceptable drug toxicity.  Prescription dose and frequency assessed for appropriateness. Planned start date of 01/09/21.   CBC w/ Diff and CMP from 12/16/20 assessed, noted patient with baseline CKD, Scr of 1.65 mg/dL (CrCl ~31 mL/min) - no renal dose adjustments required for Ibrance. Alk phos elevated at 438 U/L, likely 2/2 bone metastasis, AST and ALT stable. Pt hyperkalemic with K of 5.5 - Rx sent for Kayexalate.   Current medication list in Epic reviewed, no relevant/significant DDIs with Ibrance identified.  Evaluated chart and no patient barriers to medication adherence noted.   Prescription has been e-scribed to the [facility] for benefits analysis and approval.  Oral Oncology Clinic will continue to follow for insurance authorization, copayment issues, initial counseling and start date.  Leron Croak, PharmD, BCPS Hematology/Oncology Clinical Pharmacist [facility] [phone] 12/19/2020 8:53 AM

## 2020-12-20 ENCOUNTER — Ambulatory Visit: Payer: Medicare FFS

## 2020-12-20 ENCOUNTER — Other Ambulatory Visit: Payer: Self-pay | Admitting: Internal Medicine

## 2020-12-20 ENCOUNTER — Ambulatory Visit
Admission: RE | Admit: 2020-12-20 | Discharge: 2020-12-20 | Disposition: A | Discharge status: Home / Self Care | Payer: Medicare FFS | Source: Ambulatory Visit | Attending: Radiation Oncology | Admitting: Radiation Oncology

## 2020-12-20 ENCOUNTER — Other Ambulatory Visit: Payer: Self-pay

## 2020-12-20 DIAGNOSIS — Z510 Encounter for antineoplastic radiation therapy: Secondary | ICD-10-CM | POA: Diagnosis not present

## 2020-12-21 ENCOUNTER — Ambulatory Visit
Admission: RE | Admit: 2020-12-21 | Discharge: 2020-12-21 | Disposition: A | Discharge status: Home / Self Care | Payer: Medicare FFS | Source: Ambulatory Visit | Attending: Radiation Oncology | Admitting: Radiation Oncology

## 2020-12-21 ENCOUNTER — Other Ambulatory Visit: Payer: Self-pay

## 2020-12-21 DIAGNOSIS — Z510 Encounter for antineoplastic radiation therapy: Secondary | ICD-10-CM | POA: Diagnosis not present

## 2020-12-21 NOTE — Telephone Encounter (Addendum)
Oral Oncology Pharmacist Encounter  Met with patient while in clinic today to complete application for [place] [facility] Patient Assistance Program in an effort to reduce the patient's out of pocket expense for Ibrance to $0.  Application completed and faxed to: [place] [facility]'s phone number for follow up is: [phone]  This encounter will be updated until final determination.   Leron Croak, PharmD, BCPS Hematology/Oncology Clinical Pharmacist [facility] [phone] 12/21/2020 12:04 PM

## 2020-12-22 ENCOUNTER — Encounter: Payer: Self-pay | Admitting: Radiation Oncology

## 2020-12-22 NOTE — Progress Notes (Signed)
  Patient Name: BIANCA RANERI MRN: [id number] DOB: 1941-09-15 Referring Physician: Truitt Merle (Profile Not Attached) Date of Service: 12/22/2020 [facility], [place]                                                        End Of Treatment Note  Diagnoses: C50.912-Malignant neoplasm of unspecified site of left female breast C79.51-Secondary malignant neoplasm of bone  Cancer Staging: Recurrent metastatic Stage IA, ER positive breast cancer involving multiple bones.  Intent: Palliative  Radiation Treatment Dates: 12/07/2020 through 12/21/2020 Site Technique Total Dose (Gy) Dose per Fx (Gy) Completed Fx Beam Energies  Pelvis: Pelvis 3D 30/30 3 10/10 15X  Thoracic Spine: Spine_T11 Complex 30/30 3 10/10 15X  Ribs, Left: Chest_Lt Complex 30/30 3 10/10 10X, 15X  Ribs, Right: Chest_Rt Complex 30/30 3 10/10 10X, 15X   Narrative: The patient tolerated radiation therapy relatively well. She had some improvement of her pain during radiation, and was treated with antiemetics for nausea.  Plan: The patient will receive a follow up call from our department in about one month. She will continue to follow up as well with Dr. Burr Medico in medical oncology for continued management of her disease.   ________________________________________________    Carola Rhine, PAC

## 2020-12-28 ENCOUNTER — Telehealth: Payer: Self-pay

## 2020-12-28 NOTE — Telephone Encounter (Signed)
Ms Claycomb's daughter Elmo Putt.  Ms Dirden has been having intermittent nausea vomiting and diarrhea for the past couple of weeks. Appt made with Lucianne Lei in [facility] tomorrow for evaluation.

## 2020-12-29 ENCOUNTER — Other Ambulatory Visit: Payer: Self-pay

## 2020-12-29 ENCOUNTER — Inpatient Hospital Stay (HOSPITAL_BASED_OUTPATIENT_CLINIC_OR_DEPARTMENT_OTHER): Payer: Medicare FFS | Admitting: Medical

## 2020-12-29 ENCOUNTER — Inpatient Hospital Stay: Payer: Medicare FFS | Attending: Hematology

## 2020-12-29 VITALS — BP 135/66 | HR 91 | Temp 97.8°F | Resp 15 | Ht 62.0 in | Wt 132.1 lb

## 2020-12-29 DIAGNOSIS — R531 Weakness: Secondary | ICD-10-CM | POA: Insufficient documentation

## 2020-12-29 DIAGNOSIS — E785 Hyperlipidemia, unspecified: Secondary | ICD-10-CM | POA: Insufficient documentation

## 2020-12-29 DIAGNOSIS — Z9221 Personal history of antineoplastic chemotherapy: Secondary | ICD-10-CM | POA: Diagnosis not present

## 2020-12-29 DIAGNOSIS — D61818 Other pancytopenia: Secondary | ICD-10-CM | POA: Insufficient documentation

## 2020-12-29 DIAGNOSIS — R197 Diarrhea, unspecified: Secondary | ICD-10-CM

## 2020-12-29 DIAGNOSIS — Z9225 Personal history of immunosupression therapy: Secondary | ICD-10-CM | POA: Insufficient documentation

## 2020-12-29 DIAGNOSIS — Z79811 Long term (current) use of aromatase inhibitors: Secondary | ICD-10-CM | POA: Diagnosis not present

## 2020-12-29 DIAGNOSIS — K219 Gastro-esophageal reflux disease without esophagitis: Secondary | ICD-10-CM | POA: Insufficient documentation

## 2020-12-29 DIAGNOSIS — Z923 Personal history of irradiation: Secondary | ICD-10-CM | POA: Insufficient documentation

## 2020-12-29 DIAGNOSIS — C50212 Malignant neoplasm of upper-inner quadrant of left female breast: Secondary | ICD-10-CM | POA: Diagnosis not present

## 2020-12-29 DIAGNOSIS — R112 Nausea with vomiting, unspecified: Secondary | ICD-10-CM

## 2020-12-29 DIAGNOSIS — Z8673 Personal history of transient ischemic attack (TIA), and cerebral infarction without residual deficits: Secondary | ICD-10-CM | POA: Diagnosis not present

## 2020-12-29 DIAGNOSIS — Z170 Estrogen receptor positive status [ER+]: Secondary | ICD-10-CM

## 2020-12-29 DIAGNOSIS — M1990 Unspecified osteoarthritis, unspecified site: Secondary | ICD-10-CM | POA: Insufficient documentation

## 2020-12-29 DIAGNOSIS — Z79899 Other long term (current) drug therapy: Secondary | ICD-10-CM | POA: Insufficient documentation

## 2020-12-29 DIAGNOSIS — I10 Essential (primary) hypertension: Secondary | ICD-10-CM | POA: Diagnosis not present

## 2020-12-29 DIAGNOSIS — R0781 Pleurodynia: Secondary | ICD-10-CM | POA: Insufficient documentation

## 2020-12-29 DIAGNOSIS — C7951 Secondary malignant neoplasm of bone: Secondary | ICD-10-CM | POA: Diagnosis not present

## 2020-12-29 DIAGNOSIS — Z7902 Long term (current) use of antithrombotics/antiplatelets: Secondary | ICD-10-CM | POA: Diagnosis not present

## 2020-12-29 LAB — CBC WITH DIFFERENTIAL (CANCER CENTER ONLY)
Abs Immature Granulocytes: 0.13 10*3/uL — ABNORMAL HIGH (ref 0.00–0.07)
Basophils Absolute: 0 10*3/uL (ref 0.0–0.1)
Basophils Relative: 1 %
Eosinophils Absolute: 0.2 10*3/uL (ref 0.0–0.5)
Eosinophils Relative: 4 %
HCT: 31.9 % — ABNORMAL LOW (ref 36.0–46.0)
Hemoglobin: 10 g/dL — ABNORMAL LOW (ref 12.0–15.0)
Immature Granulocytes: 3 %
Lymphocytes Relative: 11 %
Lymphs Abs: 0.5 10*3/uL — ABNORMAL LOW (ref 0.7–4.0)
MCH: 32.9 pg (ref 26.0–34.0)
MCHC: 31.3 g/dL (ref 30.0–36.0)
MCV: 104.9 fL — ABNORMAL HIGH (ref 80.0–100.0)
Monocytes Absolute: 0.4 10*3/uL (ref 0.1–1.0)
Monocytes Relative: 9 %
Neutro Abs: 3.4 10*3/uL (ref 1.7–7.7)
Neutrophils Relative %: 72 %
Platelet Count: 180 10*3/uL (ref 150–400)
RBC: 3.04 MIL/uL — ABNORMAL LOW (ref 3.87–5.11)
RDW: 15.8 % — ABNORMAL HIGH (ref 11.5–15.5)
WBC Count: 4.6 10*3/uL (ref 4.0–10.5)
nRBC: 0.4 % — ABNORMAL HIGH (ref 0.0–0.2)

## 2020-12-29 LAB — CMP (CANCER CENTER ONLY)
ALT: 31 U/L (ref 0–44)
AST: 34 U/L (ref 15–41)
Albumin: 3.2 g/dL — ABNORMAL LOW (ref 3.5–5.0)
Alkaline Phosphatase: 474 U/L — ABNORMAL HIGH (ref 38–126)
Anion gap: 10 (ref 5–15)
BUN: 33 mg/dL — ABNORMAL HIGH (ref 8–23)
CO2: 23 mmol/L (ref 22–32)
Calcium: 8.8 mg/dL — ABNORMAL LOW (ref 8.9–10.3)
Chloride: 110 mmol/L (ref 98–111)
Creatinine: 1.76 mg/dL — ABNORMAL HIGH (ref 0.44–1.00)
GFR, Estimated: 29 mL/min — ABNORMAL LOW (ref >60)
Glucose, Bld: 147 mg/dL — ABNORMAL HIGH (ref 70–99)
Potassium: 4.5 mmol/L (ref 3.5–5.1)
Sodium: 143 mmol/L (ref 135–145)
Total Bilirubin: 0.3 mg/dL (ref 0.3–1.2)
Total Protein: 7.1 g/dL (ref 6.5–8.1)

## 2020-12-29 LAB — MAGNESIUM: Magnesium: 2.3 mg/dL (ref 1.7–2.4)

## 2020-12-29 MED ORDER — SODIUM CHLORIDE 0.9 % IV SOLN
10.0000 mg | Freq: Once | INTRAVENOUS | Status: AC
  Administered 2020-12-29: 10 mg via INTRAVENOUS
  Filled 2020-12-29: qty 10

## 2020-12-29 MED ORDER — DIPHENOXYLATE-ATROPINE 2.5-0.025 MG PO TABS
1.0000 | ORAL_TABLET | Freq: Once | ORAL | Status: AC
  Administered 2020-12-29: 1 via ORAL

## 2020-12-29 MED ORDER — PROCHLORPERAZINE MALEATE 10 MG PO TABS
ORAL_TABLET | ORAL | Status: AC
  Filled 2020-12-29: qty 1

## 2020-12-29 MED ORDER — PROCHLORPERAZINE EDISYLATE 10 MG/2ML IJ SOLN
INTRAMUSCULAR | Status: AC
  Filled 2020-12-29: qty 2

## 2020-12-29 MED ORDER — PROCHLORPERAZINE EDISYLATE 10 MG/2ML IJ SOLN
5.0000 mg | Freq: Once | INTRAMUSCULAR | Status: AC
  Administered 2020-12-29: 5 mg via INTRAVENOUS

## 2020-12-29 MED ORDER — SODIUM CHLORIDE 0.9 % IV SOLN
INTRAVENOUS | Status: AC
  Filled 2020-12-29 (×2): qty 250

## 2020-12-29 MED ORDER — DIPHENOXYLATE-ATROPINE 2.5-0.025 MG PO TABS
ORAL_TABLET | ORAL | Status: AC
  Filled 2020-12-29: qty 1

## 2020-12-29 MED ORDER — DEXAMETHASONE SODIUM PHOSPHATE 10 MG/ML IJ SOLN
INTRAMUSCULAR | Status: AC
  Filled 2020-12-29: qty 1

## 2020-12-29 NOTE — Patient Instructions (Signed)
Rehydration, Adult Rehydration is the replacement of body fluids, salts, and minerals (electrolytes) that are lost during dehydration. Dehydration is when there is not enough water or other fluids in the body. This happens when you lose more fluids than you take in. Common causes of dehydration include:  Not drinking enough fluids. This can occur when you are ill or doing activities that require a lot of energy, especially in hot weather.  Conditions that cause loss of water or other fluids, such as diarrhea, vomiting, sweating, or urinating a lot.  Other illnesses, such as fever or infection.  Certain medicines, such as those that remove excess fluid from the body (diuretics). Symptoms of mild or moderate dehydration may include thirst, dry lips and mouth, and dizziness. Symptoms of severe dehydration may include increased heart rate, confusion, fainting, and not urinating. For severe dehydration, you may need to get fluids through an IV at the hospital. For mild or moderate dehydration, you can usually rehydrate at home by drinking certain fluids as told by your health care provider. What are the risks? Generally, rehydration is safe. However, taking in too much fluid (overhydration) can be a problem. This is rare. Overhydration can cause an electrolyte imbalance, kidney failure, or a decrease in salt (sodium) levels in the body. Supplies needed You will need an oral rehydration solution (ORS) if your health care provider tells you to use one. This is a drink to treat dehydration. It can be found in pharmacies and retail stores. How to rehydrate Fluids Follow instructions from your health care provider for rehydration. The kind of fluid and the amount you should drink depend on your condition. In general, you should choose drinks that you prefer.  If told by your health care provider, drink an ORS. ? Make an ORS by following instructions on the package. ? Start by drinking small amounts,  about  cup (120 mL) every 5-10 minutes. ? Slowly increase how much you drink until you have taken the amount recommended by your health care provider.  Drink enough clear fluids to keep your urine pale yellow. If you were told to drink an ORS, finish it first, then start slowly drinking other clear fluids. Drink fluids such as: ? Water. This includes sparkling water and flavored water. Drinking only water can lead to having too little sodium in your body (hyponatremia). Follow the advice of your health care provider. ? Water from ice chips you suck on. ? Fruit juice with water you add to it (diluted). ? Sports drinks. ? Hot or cold herbal teas. ? Broth-based soups. ? Milk or milk products. Food Follow instructions from your health care provider about what to eat while you rehydrate. Your health care provider may recommend that you slowly begin eating regular foods in small amounts.  Eat foods that contain a healthy balance of electrolytes, such as bananas, oranges, potatoes, tomatoes, and spinach.  Avoid foods that are greasy or contain a lot of sugar. In some cases, you may get nutrition through a feeding tube that is passed through your nose and into your stomach (nasogastric tube, or NG tube). This may be done if you have uncontrolled vomiting or diarrhea.   Beverages to avoid Certain beverages may make dehydration worse. While you rehydrate, avoid drinking alcohol.   How to tell if you are recovering from dehydration You may be recovering from dehydration if:  You are urinating more often than before you started rehydrating.  Your urine is pale yellow.  Your energy level   improves.  You vomit less frequently.  You have diarrhea less frequently.  Your appetite improves or returns to normal.  You feel less dizzy or less light-headed.  Your skin tone and color start to look more normal. Follow these instructions at home:  Take over-the-counter and prescription medicines only  as told by your health care provider.  Do not take sodium tablets. Doing this can lead to having too much sodium in your body (hypernatremia). Contact a health care provider if:  You continue to have symptoms of mild or moderate dehydration, such as: ? Thirst. ? Dry lips. ? Slightly dry mouth. ? Dizziness. ? Dark urine or less urine than normal. ? Muscle cramps.  You continue to vomit or have diarrhea. Get help right away if you:  Have symptoms of dehydration that get worse.  Have a fever.  Have a severe headache.  Have been vomiting and the following happens: ? Your vomiting gets worse or does not go away. ? Your vomit includes blood or green matter (bile). ? You cannot eat or drink without vomiting.  Have problems with urination or bowel movements, such as: ? Diarrhea that gets worse or does not go away. ? Blood in your stool (feces). This may cause stool to look black and tarry. ? Not urinating, or urinating only a small amount of very dark urine, within 6-8 hours.  Have trouble breathing.  Have symptoms that get worse with treatment. These symptoms may represent a serious problem that is an emergency. Do not wait to see if the symptoms will go away. Get medical help right away. Call your local emergency services (911 in the [place] Do not drive yourself to the hospital. Summary  Rehydration is the replacement of body fluids and minerals (electrolytes) that are lost during dehydration.  Follow instructions from your health care provider for rehydration. The kind of fluid and amount you should drink depend on your condition.  Slowly increase how much you drink until you have taken the amount recommended by your health care provider.  Contact your health care provider if you continue to show signs of mild or moderate dehydration. This information is not intended to replace advice given to you by your health care provider. Make sure you discuss any questions you have with  your health care provider. Document Revised: 01/13/2020 Document Reviewed: 11/23/2019 Elsevier Patient Education  2021 Elsevier Inc.  

## 2020-12-30 ENCOUNTER — Telehealth: Payer: Medicare FFS | Admitting: Hematology

## 2020-12-30 NOTE — Progress Notes (Signed)
[facility] Progress Note   Shirley Wells [id number] 11/26/1941 80 y.o.  Shirley Wells is managed by Dr. Truitt Wells  Actively treated with chemotherapy/immunotherapy/hormonal therapy: yes  Current therapy: Anastrozole and Ibrance  Next scheduled appointment with provider: 01/23/2021  Assessment: Plan:    Diarrhea, unspecified type - Plan: 0.9 %  sodium chloride infusion, diphenoxylate-atropine (LOMOTIL) 2.5-0.025 MG per tablet 1 tablet  Non-intractable vomiting with nausea, unspecified vomiting type - Plan: dexamethasone (DECADRON) 10 mg in sodium chloride 0.9 % 50 mL IVPB, prochlorperazine (COMPAZINE) injection 5 mg  Malignant neoplasm of upper-inner quadrant of left breast in female, estrogen receptor positive (Shirley Wells)   Diarrhea: The patient was given 1 L normal saline today.  Additionally she was given a dose of Lomotil x1 today.  It was discussed with the patient today that her diarrhea may come from her use of Ensure given that she is status post a cholecystectomy.  I discussed with her today that she may need a referral to a gastroenterologist should her diarrhea continue.  Nausea and vomiting: The patient was given 1 L of normal saline today along with Decadron 10 mg IV and Compazine 5 mg IV.  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 Truitt Wells and is treated with anastrozole and Ibrance.  She is scheduled to be seen in follow-up on 01/23/2021.  No change was made in her therapy today.  Please see After Visit Summary for patient specific instructions.  Future Appointments  Date Time Provider [place]  01/06/2021  1:00 PM Shirley Merle, MD [facility] None  01/16/2021  3:00 PM [facility] None  01/23/2021 10:45 AM [facility] LAB [facility] None  01/23/2021 11:20 AM Shirley Merle, MD [facility] None  03/13/2021 11:00 AM Shirley Rail, MD [facility] None     No orders of the defined types were placed in this encounter.      Subjective:   Patient ID:  Shirley Wells is a 80 y.o. (DOB [birth year]) female.  Chief Complaint: No chief complaint on file.   HPI Shirley Wells  is a 80 y.o. 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  She continues to be followed by Dr. Truitt Wells and is treated with anastrozole and Ibrance.  She is status post completion of radiation therapy which she completed on 12/21/2020.  She presents to the clinic today with her daughter.  She reports that she has had increasing nausea over the last couple of weeks.  She also reports having ongoing diarrhea for which she is taking no medications.  She reports that her diarrhea has increased in severity over the last few weeks.  She reports that she gets nauseated with all solid foods.  She has been consuming Ensure frequently.  She also reports that she has had a cholecystectomy.  She denies fevers, chills, sweats.  Medications: I have reviewed the patient's current medications.  Allergies:  Allergies  Allergen Reactions  . Influenza Vaccines Other (See Comments)    Patient preference  . Pneumococcal Vaccines Other (See Comments)    Patient preference   . Simvastatin Other (See Comments)    REACTION: inc LFT >3x baseline  . Tetanus Toxoids Other (See Comments)    Patient preference   . Zoster Vaccine Live Other (See Comments)    Patient preference  Past Medical History:  Diagnosis Date  . CHICKENPOX, HX OF 03/21/2010   Qualifier: Diagnosis of  By: Shirley Wells    . 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 Dyslipidemia    intol of statin due to >3x increase LFTs  . GERD (gastroesophageal reflux disease)    hx  . Hypertension, essential   . Malignant neoplasm of upper-inner quadrant of left breast in  female, estrogen receptor positive ([place]) 06/20/2017  . Osteoarthritis   . Stroke [facility])     Past Surgical History:  Procedure Laterality Date  . CHOLECYSTECTOMY  1981  . MASTECTOMY W/ SENTINEL NODE BIOPSY Left 07/30/2017   Procedure: LEFT TOTAL MASTECTOMY WITH AXILLARY SENTINEL LYMPH NODE BIOPSY;  Surgeon: Shirley Bookbinder, MD;  Location: [facility];  Service: General;  Laterality: Left;    Family History  Problem Relation Age of Onset  . Breast cancer Mother 22  . Arthritis Other   . Breast cancer Maternal Aunt     Social History   Socioeconomic History  . Marital status: Divorced    Spouse name: Not on file  . Number of children: Not on file  . Years of education: Not on file  . Highest education level: Not on file  Occupational History  . Not on file  Tobacco Use  . Smoking status: Never Smoker  . Smokeless tobacco: Never Used  Substance and Sexual Activity  . Alcohol use: No    Alcohol/week: 0.0 standard drinks  . Drug use: No  . Sexual activity: Not on file  Other Topics Concern  . Not on file  Social History Narrative   Divorced  >40 yrs, single and lives alone.    Supportive daughters, 1 is a Therapist, sports at [employer]).    Patient enjoys gardening, dancing and caring for her grand children.   Social Determinants of Health   Financial Resource Strain: Not on file  Food Insecurity: Not on file  Transportation Needs: No Transportation Needs  . Lack of Transportation (Medical): No  . Lack of Transportation (Non-Medical): No  Physical Activity: Not on file  Stress: Not on file  Social Connections: Not on file  Intimate Partner Violence: Not on file    Past Medical History, Surgical history, Social history, and Family history were reviewed and updated as appropriate.   Please see review of systems for further details on the patient's review from today.   Review of Systems:  Review of Systems  Constitutional: Negative for appetite change, chills,  diaphoresis and fever.  HENT: Negative for trouble swallowing.   Respiratory: Negative for cough, chest tightness and shortness of breath.   Cardiovascular: Negative for chest pain, palpitations and leg swelling.  Gastrointestinal: Positive for diarrhea, nausea and vomiting. Negative for abdominal distention, abdominal pain, blood in stool and constipation.  Genitourinary: Negative for decreased urine volume and difficulty urinating.  Neurological: Negative for weakness.    Objective:   Physical Exam:  BP 135/66 (BP Location: Right Arm, Patient Position: Sitting)   Pulse 91   Temp 97.8 F (36.6 C) (Tympanic)   Resp 15   Ht 5\' 2"  (1.575 m)   Wt 132 lb 1.6 oz (59.9 kg)   SpO2 99%   BMI 24.16 kg/m  ECOG: 1  Physical Exam Constitutional:      General: She is not in acute distress.    Appearance: She is not diaphoretic.  HENT:  Head: Normocephalic and atraumatic.  Eyes:     General: No scleral icterus.       Right eye: No discharge.        Left eye: No discharge.     Conjunctiva/sclera: Conjunctivae normal.  Cardiovascular:     Rate and Rhythm: Normal rate and regular rhythm.     Heart sounds: Normal heart sounds. No murmur heard. No friction rub. No gallop.   Pulmonary:     Effort: Pulmonary effort is normal. No respiratory distress.     Breath sounds: Normal breath sounds. No wheezing or rales.  Abdominal:     General: Bowel sounds are normal. There is no distension.     Tenderness: There is no abdominal tenderness. There is no guarding.  Skin:    General: Skin is warm and dry.     Findings: No erythema or rash.  Neurological:     Mental Status: She is alert.     Coordination: Coordination normal.     Gait: Gait normal.     Lab Review:     Component Value Date/Time   NA 143 12/29/2020 1001   NA 142 06/26/2017 0823   K 4.5 12/29/2020 1001   K 4.3 06/26/2017 0823   CL 110 12/29/2020 1001   CO2 23 12/29/2020 1001   CO2 25 06/26/2017 0823   GLUCOSE 147 (H)  12/29/2020 1001   GLUCOSE 81 06/26/2017 0823   BUN 33 (H) 12/29/2020 1001   BUN 23.6 06/26/2017 0823   CREATININE 1.76 (H) 12/29/2020 1001   CREATININE 1.4 (H) 06/26/2017 0823   CALCIUM 8.8 (L) 12/29/2020 1001   CALCIUM 9.5 06/26/2017 0823   PROT 7.1 12/29/2020 1001   PROT 7.2 06/26/2017 0823   ALBUMIN 3.2 (L) 12/29/2020 1001   ALBUMIN 3.5 06/26/2017 0823   AST 34 12/29/2020 1001   AST 27 06/26/2017 0823   ALT 31 12/29/2020 1001   ALT 31 06/26/2017 0823   ALKPHOS 474 (H) 12/29/2020 1001   ALKPHOS 144 06/26/2017 0823   BILITOT 0.3 12/29/2020 1001   BILITOT 0.41 06/26/2017 0823   GFRNONAA 29 (L) 12/29/2020 1001   GFRAA 37 (L) 07/22/2017 1231       Component Value Date/Time   WBC 4.6 12/29/2020 1001   WBC 4.5 12/16/2020 1031   RBC 3.04 (L) 12/29/2020 1001   HGB 10.0 (L) 12/29/2020 1001   HGB 12.9 06/26/2017 0823   HCT 31.9 (L) 12/29/2020 1001   HCT 39.1 06/26/2017 0823   PLT 180 12/29/2020 1001   PLT 195 06/26/2017 0823   MCV 104.9 (H) 12/29/2020 1001   MCV 99.7 06/26/2017 0823   MCH 32.9 12/29/2020 1001   MCHC 31.3 12/29/2020 1001   RDW 15.8 (H) 12/29/2020 1001   RDW 13.5 06/26/2017 0823   LYMPHSABS 0.5 (L) 12/29/2020 1001   LYMPHSABS 2.1 06/26/2017 0823   MONOABS 0.4 12/29/2020 1001   MONOABS 0.7 06/26/2017 0823   EOSABS 0.2 12/29/2020 1001   EOSABS 0.4 06/26/2017 0823   BASOSABS 0.0 12/29/2020 1001   BASOSABS 0.1 06/26/2017 0823   -------------------------------  Imaging from last 24 hours (if applicable):  Radiology interpretation: MR Brain W Wo Contrast  Result Date: 12/04/2020 CLINICAL DATA:  Metastatic breast cancer, staging EXAM: MRI HEAD WITHOUT AND WITH CONTRAST TECHNIQUE: Multiplanar, multiecho pulse sequences of the brain and surrounding structures were obtained without and with intravenous contrast. CONTRAST:  41mL MULTIHANCE GADOBENATE DIMEGLUMINE 529 MG/ML IV SOLN COMPARISON:  None. FINDINGS: Brain: There is no acute infarction  or intracranial  hemorrhage. There is no intracranial mass, mass effect, or edema. There is no hydrocephalus or extra-axial fluid collection. Prominence of the ventricles and sulci reflects mild generalized parenchymal volume loss. Patchy foci of T2 hyperintensity in the supratentorial and pontine white matter are nonspecific but probably reflect mild chronic microvascular ischemic changes. There is a small chronic right cerebellar infarct. No abnormal enhancement. Vascular: Major vessel flow voids at the skull base are preserved. Skull and upper cervical spine: Decreased upper cervical spine and skull base T1 marrow signal likely reflects known osseous metastatic disease. Sinuses/Orbits: Minor mucosal thickening.  Orbits are unremarkable. Other: Sella is partially empty. Minor left mastoid fluid opacification. IMPRESSION: No evidence of intracranial metastatic disease. 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 Mild chronic microvascular ischemic changes. Small chronic right cerebellar infarct. Electronically Signed   By: Macy Mis M.D.   On: 12/04/2020 19:41   CT Biopsy  Result Date: 12/09/2020 INDICATION: History of metastatic breast cancer, now with extensive osseous metastatic disease. Please perform CT-guided biopsy of infiltrative lesion within the right ilium for tissue diagnostic purposes. EXAM: CT-GUIDED BONE LESION BIOPSY INVOLVING THE RIGHT ILIUM MEDICATIONS: None ANESTHESIA/SEDATION: Fentanyl 75 mcg IV; Versed 1 mg IV Sedation Time: 11 Minutes; The patient was continuously monitored during the procedure by the interventional radiology nurse under my direct supervision. COMPLICATIONS: None immediate. PROCEDURE: Informed consent was obtained from the patient following an explanation of the procedure, risks, benefits and alternatives. The patient understands, agrees and consents for the procedure. All questions were addressed. A time out was performed prior to the initiation of the procedure. The  patient was positioned prone and non-contrast localization CT was performed of the pelvis to demonstrate the mixed lytic and sclerotic infiltrative lesion involving the right ilium. The operative site was prepped and draped in the usual sterile fashion. Under sterile conditions and local anesthesia, a 22 gauge spinal needle was utilized for procedural planning. Next, an 11 gauge coaxial bone biopsy needle was advanced into the right iliac marrow space. Needle position was confirmed with CT imaging. Next an inner 13 gauge biopsy device was utilized to acquire a core needle biopsy (image 12, series 3). Then the outer 11 gauge coaxial bone biopsy needle was utilized to acquire initial core needle biopsy sample. Finally, the 11 gauge coaxial bone biopsy needle was advanced into the posterior aspect of the ileum. Appropriate positioning confirmed with CT imaging and an additional core needle biopsy was obtained. The needle was removed and superficial hemostasis was obtained with manual compression. A dressing was applied. The patient tolerated the procedure well without immediate post procedural complication. IMPRESSION: Successful CT guided right biopsy of mixed lytic and sclerotic lesion involving the posterior aspect of the right ilium. Electronically Signed   By: Sandi Mariscal M.D.   On: 12/09/2020 13:05

## 2020-12-30 NOTE — Telephone Encounter (Signed)
Patient is approved for Ibrance at no charge from [employer] 12/30/20-11/25/21  [facility] Phone [phone] Fax [phone] 12/30/2020 12:23 PM

## 2021-01-03 NOTE — Telephone Encounter (Signed)
Oral Chemotherapy Pharmacist Encounter  I spoke with patient for overview of: Ibrance for the treatment of metastatic, hormone-receptor positive, HER2 receptor negative breast cancer, in combination with anastrozole, planned duration until disease progression or unacceptable toxicity.   Counseled patient on administration, dosing, side effects, monitoring, drug-food interactions, safe handling, storage, and disposal.  Patient will take Ibrance 193m tablets, 1 tablet by mouth once daily, with or without food, taken for 3 weeks on, 1 week off, and repeated.  Patient knows to avoid grapefruit and grapefruit juice while on treatment with Ibrance.  Ibrance start date: ~01/09/21 - patient sees MD on 01/06/21 and knows to start medication once instructed.   Adverse effects include but are not limited to: fatigue, GI upset, nausea, decreased blood counts, and increased upper respiratory infections. Severe, life-threatening, and/or fatal interstitial lung disease (ILD) and/or pneumonitis may occur with CDK 4/6 inhibitors.  Patient will obtain anti diarrheal and alert the office of 4 or more loose stools above baseline.  Patient reminded of WBC check on Cycle 1 Day 14 for dose and ANC assessment.  Reviewed with patient importance of keeping a medication schedule and plan for any missed doses. No barriers to medication adherence identified.  Medication reconciliation performed and medication/allergy list updated.  Patient approved for manufacturer assistance through [employer] through 11/25/21 for Ibrance. Patient provided with [employer]'s number ([id number] to set up initial fill of Ibrance.   All questions answered.  Ms. LTeemvoiced understanding and appreciation.   Medication education handout placed in mail for patient. Patient knows to call the office with questions or concerns. Oral Chemotherapy Clinic phone number provided to patient.   RLeron Croak PharmD,  BCPS Hematology/Oncology Clinical Pharmacist [phone]/06/2021 3:07 PM

## 2021-01-04 NOTE — Progress Notes (Signed)
[place]   [phone] [phone]   Clinic Follow up Note   Patient Care Team: Binnie Rail, MD as PCP - General (Internal Medicine) Garvin Fila, MD as Consulting Physician (Neurology) Rolm Bookbinder, MD as Consulting Physician (General Surgery) Truitt Merle, MD as Consulting Physician (Hematology) Kyung Rudd, MD as Consulting Physician (Radiation Oncology)   I connected with Shirley Wells on 01/06/2021 at  1:00 PM EST by video enabled telemedicine visit and verified that I am speaking with the correct person using two identifiers.  I discussed the limitations, risks, security and privacy concerns of performing an evaluation and management service by telephone and the availability of in person appointments. I also discussed with the patient that there may be a patient responsible charge related to this service. The patient expressed understanding and agreed to proceed.   Other persons participating in the visit and their role in the encounter:  Her daughter   Patient's location:  Her home Provider's location:  My Office  CHIEF COMPLAINT: F/u of metastatic breast cancer   SUMMARY OF ONCOLOGIC HISTORY: Oncology History Overview Note  Cancer Staging Malignant neoplasm of upper-inner quadrant of left breast in female, estrogen receptor positive ([place]) Staging form: Breast, AJCC 8th Edition - Clinical stage from 06/18/2017: Stage IB (cT2, cN0, cM0, G2, ER: Positive, PR: Positive, HER2: Negative) - Signed by Truitt Merle, MD on 06/24/2017 - Pathologic stage from 07/30/2017: Stage IA (pT2, pN0, cM0, G2, ER: Positive, PR: Positive, HER2: Negative, Oncotype DX score: 23) - Signed by Truitt Merle, MD on 08/23/2017     Malignant neoplasm of upper-inner quadrant of left breast in female, estrogen receptor positive ([place])  06/17/2017 Mammogram   [place] and MM diagnostic Breast Tomo Bilateral  IMPRESSION: 1. 3.4 palpable mass in the 10 o'clock position of the left  breast is highly suspicious for malignancy. 2. No evidence of malignancy in the right breast. 3. Ultrasound of the left axilla shows normal sized axillary lymph nodes.   06/18/2017 Initial Biopsy   Diagnosis 06/18/17 Breast, left, needle core biopsy, 10:00 o'clock - INVASIVE MAMMARY CARCINOMA. At least G2  The malignant cells are negative for E-Cadherin, supporting a lobular phenotype.   06/18/2017 Initial Diagnosis   Malignant neoplasm of upper-inner quadrant of left breast in female, estrogen receptor positive ([place])   06/18/2017 Receptors her2   ER 95%+, PR 25%+, both strong staining  HER2- Ki67 30%    07/30/2017 Surgery   LEFT TOTAL MASTECTOMY WITH AXILLARY SENTINEL LYMPH NODE BIOPSY by Dr. Donne Hazel on 07/30/17   07/30/2017 Pathology Results   Diagnosis 07/30/17 1. Breast, simple mastectomy, Left Total - INVASIVE LOBULAR CARCINOMA, GRADE 2, SPANNING 3.5 CM. - LOBULAR CARCINOMA IN SITU. - INVASIVE CARCINOMA IS BROADLY 0.1 CM FROM POSTERIOR MARGIN. - PERINEURAL INVASION PRESENT. - SEE ONCOLOGY TABLE. 2. Lymph node, sentinel, biopsy, Left Axillary - ONE OF ONE LYMPH NODES NEGATIVE FOR CARCINOMA (0/1). 3. Lymph node, sentinel, biopsy, Left - ONE OF ONE LYMPH NODES NEGATIVE FOR CARCINOMA (0/1).   07/30/2017 Oncotype testing   Oncotype 07/30/17 Recurrence score is 23, an intermediate risk With a 10-year risk of recurrence at 15% with tamoxifen alone.     11/11/2020 Imaging   MRI Lumbar Spine  IMPRESSION: Diffuse osseous metastases. Chronic inferior L2 endplate deformity with mild height loss.   Likely late subacute pathologic fracture deformities involving the superior L1 endplate and right L3 lamina.   Multilevel spondylosis. Moderate to severe spinal canal and neural foraminal narrowing  at the L3-4 and L4-5 levels.   Mild to moderate spinal canal and neural foraminal narrowing at the L5-S1 level.   11/28/2020 PET scan   IMPRESSION: 1. Widespread and diffuse hypermetabolic  bony metastases. 2. No evidence for soft tissue metastases in the neck, chest, abdomen, or pelvis. 3.  Aortic Atherosclerois (ICD10-170.0)     11/29/2020 -  Chemotherapy   Zometa starting 11/29/20. Given her CKD will only give when she has hypercalcemia.      12/04/2020 Imaging   MRI Brain  IMPRESSION: No evidence of intracranial metastatic disease.   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   Mild chronic microvascular ischemic changes. Small chronic right cerebellar infarct.     12/07/2020 - 12/21/2020 Radiation Therapy   palliative RT to left hip with Dr Lisbeth Renshaw on 12/07/20-12/21/20   12/09/2020 Pathology Results   FINAL MICROSCOPIC DIAGNOSIS:   A. BONE, BIOPSY:  - Metastatic carcinoma.  - See comment.   COMMENT:  The morphology is compatible with metastatic lobular breast carcinoma.  Estrogen receptor, progesterone receptor and HER2/neu will be performed.   ADDENDUM:   PROGNOSTIC INDICATOR RESULTS:   Immunohistochemical and morphometric analysis performed manually   The tumor cells are EQUIVOCAL for Her2 (2+).  Her2 by FISH will be performed and the results will be reported  separately.   Estrogen Receptor:       POSITIVE, 50%, MODERATE-STRONG STAINING  Progesterone Receptor:   POSITIVE, 15%, MODERATE-STRONG STAINING    11/2020 -  Anti-estrogen oral therapy   First-line Anti-estrogen therapy Anastrozole 72m daily starting in 11/2020 and Ibrance starting in 12/2020    12/2020 -  Chemotherapy   First-line Anti-estrogen therapy Anastrozole starting in 11/2020 and Ibrance 3 weeks on/1 weeks off starting in 12/2020        CURRENT THERAPY:  Zometa starting 11/29/20. Given her CKD will only give when she has hypercalcemia.   Pending First-line Anti-estrogen therapy Anastrozole 163mdaily starting end of 11/2020 and Ibrance 3 weeks on/1 weeks off starting beginning of 01/09/21  INTERVAL HISTORY:  Shirley ZUNIGAs here for a follow up. They identified themselves by  face to face video. She notes her pain is better after radiation. She notes she is still nauseous as Zofran is not helping her. This is impacting her eating. She notes mirtazapine is not helping, but feels if her nausea resolves she can eat better.    REVIEW OF SYSTEMS:   Constitutional: Denies fevers, chills or abnormal weight loss Eyes: Denies blurriness of vision Ears, nose, mouth, throat, and face: Denies mucositis or sore throat Respiratory: Denies cough, dyspnea or wheezes Cardiovascular: Denies palpitation, chest discomfort or lower extremity swelling Gastrointestinal:  (+) Nausea  Skin: Denies abnormal skin rashes Lymphatics: Denies new lymphadenopathy or easy bruising Neurological:Denies numbness, tingling or new weaknesses Behavioral/Psych: Mood is stable, no new changes  All other systems were reviewed with the patient and are negative.  MEDICAL HISTORY:  Past Medical History:  Diagnosis Date  . CHICKENPOX, HX OF 03/21/2010   Qualifier: Diagnosis of  By: BrMarca AnconaMA, Lucy    . 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 Dyslipidemia    intol of statin due to >3x increase LFTs  . GERD (gastroesophageal reflux disease)    hx  . Hypertension, essential   . Malignant neoplasm of upper-inner quadrant of left breast in [place], [place]  .  Osteoarthritis   . Stroke [facility])     SURGICAL HISTORY: Past Surgical History:  Procedure Laterality Date  . CHOLECYSTECTOMY  1981  . MASTECTOMY W/ SENTINEL NODE BIOPSY Left 07/30/2017   Procedure: LEFT TOTAL MASTECTOMY WITH AXILLARY SENTINEL LYMPH NODE BIOPSY;  Surgeon: Rolm Bookbinder, MD;  Location: [facility];  Service: General;  Laterality: Left;    I have reviewed the social history and family history with the patient and they are unchanged from previous note.  ALLERGIES:  is allergic to influenza vaccines, pneumococcal  vaccines, simvastatin, tetanus toxoids, and zoster vaccine live.  MEDICATIONS:  Current Outpatient Medications  Medication Sig Dispense Refill  . prochlorperazine (COMPAZINE) 10 MG tablet Take 1 tablet (10 mg total) by mouth every 6 (six) hours as needed for nausea or vomiting. 30 tablet 1  . acetaminophen (TYLENOL) 650 MG CR tablet Take 1,300 mg by mouth every 8 (eight) hours as needed for pain.    Marland Kitchen amLODipine (NORVASC) 5 MG tablet Take 1 tablet (5 mg total) by mouth daily. (Patient not taking: No sig reported) 90 tablet 3  . anastrozole (ARIMIDEX) 1 MG tablet Take 1 tablet (1 mg total) by mouth daily. 30 tablet 3  . aspirin 81 MG tablet Take 81 mg by mouth 2 (two) times a week.    . clopidogrel (PLAVIX) 75 MG tablet TAKE 1 TABLET(75 MG) BY MOUTH DAILY (Patient taking differently: Take 75 mg by mouth daily.) 90 tablet 1  . furosemide (LASIX) 20 MG tablet Take 20 mg by mouth daily as needed for fluid.    . hydrALAZINE (APRESOLINE) 25 MG tablet Take 1 tablet (25 mg total) by mouth in the morning and at bedtime. 60 tablet 5  . HYDROcodone-acetaminophen (NORCO/VICODIN) 5-325 MG tablet Take 1-2 tablets by mouth every 4 (four) hours as needed for severe pain. 60 tablet 0  . lisinopril (ZESTRIL) 10 MG tablet TAKE 1 TABLET(10 MG) BY MOUTH DAILY 90 tablet 0  . loratadine (CLARITIN) 10 MG tablet Take 10 mg by mouth daily as needed for allergies.    . Menthol, Topical Analgesic, (BIOFREEZE EX) Apply 1 application topically daily as needed (muscle pain).    . mirtazapine (REMERON) 7.5 MG tablet Take 1 tablet (7.5 mg total) by mouth at bedtime. (Patient not taking: No sig reported) 90 tablet 3  . Multiple Vitamin (MULTIVITAMIN) capsule Take 1 capsule by mouth daily.    . ondansetron (ZOFRAN) 8 MG tablet Take 1 tablet (8 mg total) by mouth every 8 (eight) hours as needed for nausea or vomiting. 30 tablet 3  . palbociclib (IBRANCE) 100 MG tablet Take 1 tablet (100 mg total) by mouth daily. Take for 21 days  on, 7 days off, repeat every 28 days. Plan to start on 01/09/21 21 tablet 0   No current facility-administered medications for this visit.    PHYSICAL EXAMINATION: ECOG PERFORMANCE STATUS: 3 - Symptomatic, >50% confined to bed  No vitals taken today, Exam not performed today   LABORATORY DATA:  I have reviewed the data as listed CBC Latest Ref Rng & Units 12/29/2020 12/16/2020 12/09/2020  WBC 4.0 - 10.5 K/uL 4.6 4.5 7.3  Hemoglobin 12.0 - 15.0 g/dL 10.0(L) 9.7(L) 9.8(L)  Hematocrit 36.0 - 46.0 % 31.9(L) 30.9(L) 32.6(L)  Platelets 150 - 400 K/uL 180 247 299     CMP Latest Ref Rng & Units 12/29/2020 12/16/2020 11/29/2020  Glucose 70 - 99 mg/dL 147(H) 109(H) 140(H)  BUN 8 - 23 mg/dL 33(H) 33(H) 36(H)  Creatinine 0.44 -  1.00 mg/dL 1.76(H) 1.65(H) 1.85(H)  Sodium 135 - 145 mmol/L 143 142 141  Potassium 3.5 - 5.1 mmol/L 4.5 5.5(H) 5.1  Chloride 98 - 111 mmol/L 110 111 109  CO2 22 - 32 mmol/L 23 19(L) 23  Calcium 8.9 - 10.3 mg/dL 8.8(L) 8.9 10.1  Total Protein 6.5 - 8.1 g/dL 7.1 7.7 7.5  Total Bilirubin 0.3 - 1.2 mg/dL 0.3 0.3 0.3  Alkaline Phos 38 - 126 U/L 474(H) 438(H) 464(H)  AST 15 - 41 U/L 34 44(H) 37  ALT 0 - 44 U/L 31 23 21       RADIOGRAPHIC STUDIES: I have personally reviewed the radiological images as listed and agreed with the findings in the report. No results found.   ASSESSMENT & PLAN:  Shirley Wells is a 80 y.o. female with   1. 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 She was treated with left total mastectomy. She did not require radiation and declined antiestrogen therapy due to concerns of side effects. She lost follow up after 07/2017. -After 6 months of left hip and ribcage pain she was found to have diffuse bone metastasis on 11/28/20 PET. This was confirmed on 12/09/20 bone  biopsy which showed metastatic carcinoma compatible from prior breast cancer, ER50%+, PR 15% positive and HER2 negative.  -Given her metastatic breast cancer, her cancer is no longer curable but still treatable. I started her on first-line Anastrozole end of 11/2020, she has been tolerating well  -plan to  and Ibrance 3 weeks on/1 week off starting 01/09/21. She has received the medication. I reviewed side effects with her again  -f/u on 2/28. She can contact me sooner if she is having any issues on treatment.    2.Diffuse bone metastasis, L1/L3 fracture, left hip and rib cage pain, hypercalcemia -Shepresented with significant left hip and rib cage pain for 6 months,lumbar MRI showed diffuse bone metastasisand subacute pathologic fracture of L1 and L3.  -Her 11/28/20 PET scan showed diffuse bone metastasis in pelvis,femur, spine, and ribs. No visceralmetastasis. Her bone biopsy from 12/09/20 showed metastatic carcinoma compatible from prior breast cancer.  -Due to her severe pain in the left hip, she started palliative RT with Dr Lisbeth Renshaw on 12/07/20-12/21/20. Radiation has improved her pain  -She started Zometa on 11/29/20, but given her CKD will only give when she has hypercalcemia.  -She is not having significant back pain or tendernessfromher lumbar fracture, Dr. Thurnell Garbe not recommend osteocoolorkyphoplasty at this point. -For her rib cage and left hip pain she is on Noroc 2-3 tabs a day. She also uses Tylenol. Her    3. Anorexia, weight loss, nausea, weakness/fatigue  -secondary to #1, will proceed with full staging work up to r/o distant metastasis including brain -I recommend to ambulate with a cane at home due to her back pain and generalized weakness. I will given PT referral to help her increase activity level.  -She can increased Mirtazapine which has helped her sleep but not her appetite. She can try again, and if not helping she can stop.  -I encouraged her to increase Ensure to  2-3 bottles at half bottle at a time. She is mostly eating broths/soups -Her nausea has persisted as Zofran is not helping her. I will call in compazine today.   4.Chroniccomorbidities:Pre-DM, CKD stage III, history of CVA, HL, osteopenia -On amlodipine, clopidogrel,  hydralazine, aspirin, furosemide, lisinopril, -She has had osteoporosis as seen on 2016 and 08/2017 DEXA. Her 08/2020 DEXA improved to osteopenia.Due to hypercalcemia, will hold calcium for now, she can continue vitamin D -Continue to F/u withPCP Dr. Billey Gosling -Her Cr at 1.65 today (12/16/20). I recommend she remain hydrated.    5. Anemia  -Inially mild anemia in 08/2020, now moderate in the past 2 weeks. This is likely related to bone mets.  -Continue oral iron.    PLAN: -Continue Anastrozole  -Start Ibrance 136m daily on 01/09/21, for 3 weeks on and 1 week off  -Lab, f/u on 2/28   No problem-specific Assessment & Plan notes found for this encounter.   No orders of the defined types were placed in this encounter.  I discussed the assessment and treatment plan with the patient. The patient was provided an opportunity to ask questions and all were answered. The patient agreed with the plan and demonstrated an understanding of the instructions.  The patient was advised to call back or seek an in-person evaluation if the symptoms worsen or if the condition fails to improve as anticipated.  The total time spent in the appointment was 22 minutes.    YTruitt Merle MD 01/06/2021   I, AJoslyn Devon am acting as scribe for YTruitt Merle MD.   I have reviewed the above documentation for accuracy and completeness, and I agree with the above.

## 2021-01-06 ENCOUNTER — Encounter: Payer: Self-pay | Admitting: Hematology

## 2021-01-06 ENCOUNTER — Telehealth (HOSPITAL_BASED_OUTPATIENT_CLINIC_OR_DEPARTMENT_OTHER): Payer: Medicare FFS | Admitting: Hematology

## 2021-01-06 DIAGNOSIS — C50212 Malignant neoplasm of upper-inner quadrant of left female breast: Secondary | ICD-10-CM | POA: Diagnosis not present

## 2021-01-06 DIAGNOSIS — Z170 Estrogen receptor positive status [ER+]: Secondary | ICD-10-CM | POA: Diagnosis not present

## 2021-01-06 MED ORDER — PROCHLORPERAZINE MALEATE 10 MG PO TABS: 10.0000 mg | ORAL_TABLET | Freq: Four times a day (QID) | ORAL | 1 refills | Status: DC | PRN

## 2021-01-10 ENCOUNTER — Telehealth: Payer: Self-pay | Admitting: Internal Medicine

## 2021-01-10 ENCOUNTER — Telehealth: Payer: Self-pay | Admitting: Hematology

## 2021-01-10 MED ORDER — NITROFURANTOIN MONOHYD MACRO 100 MG PO CAPS: 100.0000 mg | ORAL_CAPSULE | Freq: Two times a day (BID) | ORAL | 0 refills | Status: DC

## 2021-01-10 NOTE — Telephone Encounter (Signed)
Called patient regarding upcoming 02/28 appointment, patient is notified.

## 2021-01-10 NOTE — Telephone Encounter (Signed)
Have sent in macrobid. If no resolution of symptoms after treatment should take home specimen cup to return with sample.

## 2021-01-10 NOTE — Telephone Encounter (Signed)
Spoke with patient today and info given. 

## 2021-01-10 NOTE — Telephone Encounter (Signed)
Patient's daughter Jackelyn Poling called, patient is having UTI symptoms. I offered her an appointment with a different provider but due to her radiation treatments she is really weak. Her daughter would like to see if she can have something prescribed to her for the time being.   Please advise.

## 2021-01-11 ENCOUNTER — Telehealth: Payer: Self-pay

## 2021-01-11 NOTE — Telephone Encounter (Signed)
Nutrition Assessment   Reason for Assessment: Patient identified on Malnutrition Screening report for weight loss   ASSESSMENT:  80 year old female with metastatic left breast cancer.  Past medical history of mastectomy, HTN, CVA, GERD, stroke.  Patient taking ibrance and anastrozole  Spoke with patient via phone to introduce self and service at [facility].  Patient reports that nausea and vomiting an diarrhea is better.  Reports that she is taking compazine.  Usually drinks ensure original in the am.  Had egg and cheese biscuit this am.  Lunch is something light (yogurt). Last night for dinner ate roast beef and mashed potatoes.  Reports that family and friends bring food in for her. She is weak to stand and cook for long periods of time.  Reports no diarrhea but more constipation.  She is taking medications for constipation.  Drinks water mostly.     Medications: compazine, remeron, MVI   Labs: reviewed   Anthropometrics:   Height: 62 inches Weight: 132 lb 2/3 144 lb 09/12/2020 BMI: 24  8% weight loss in the last 3 1/2 months, concerning   Estimated Energy Needs  Kcals: 1500-1800 Protein: 75-90 g Fluid: 1.5 L   NUTRITION DIAGNOSIS: Inadequate oral intake related to cancer and cancer related treatment side effects as evidenced by 8% weight loss and poor po intake previously   INTERVENTION:  Encouraged good sources of protein and food sources discussed.  Discussed strategies to help with nausea. Will mail handout Encouraged high calorie shake (350 calories). Contact information mailed to patient   MONITORING, EVALUATION, GOAL: weight trends, intake   Next Visit: phone f/u~4 weeks  Jeena Arnett B. Zenia Resides, [place], [place] Registered Dietitian [phone] (mobile)

## 2021-01-11 NOTE — Progress Notes (Signed)
  Radiation Oncology         [phone] ________________________________  Name: Shirley Wells MRN: [id number]  Date of Service: 01/16/2021  DOB: [birth year]  Post Treatment Telephone Note  Diagnosis:   C50.912-Malignant neoplasm of unspecified site of left female breast C79.51-Secondary malignant neoplasm of bone  Interval Since Last Radiation:  3 weeks  Radiation Treatment Dates: 12/07/2020 through 12/21/2020 Site Technique Total Dose (Gy) Dose per Fx (Gy) Completed Fx Beam Energies  Pelvis: Pelvis 3D 30/30 3 10/10 15X  Thoracic Spine: Spine_T11 Complex 30/30 3 10/10 15X  Ribs, Left: Chest_Lt Complex 30/30 3 10/10 10X, 15X  Ribs, Right: Chest_Rt Complex 30/30 3 10/10 10X, 15X    Narrative:  The patient was contacted today for routine follow-up. During treatment she did very well with radiotherapy and had some improvement of her pain during radiation, and was treated with antiemetics for nausea.. She reports she that the nausea is better since radiation treatment. Patient states that she was diagnosed with shingles and is on medication.   Impression/Plan: 1. Malignant neoplasm of unspecified site of left female breast Secondary malignant neoplasm of bone. The patient has been doing well since completion of radiotherapy. We discussed that we would be happy to continue to follow her as needed, but she will also continue to follow up with Dr. Burr Medico in medical oncology. She was counseled on skin care as well as measures to avoid sun exposure to this area.  2. Survivorship. We discussed the importance of survivorship evaluation and encouraged her to attend her upcoming visit with that clinic.     Loma Messing, LPN

## 2021-01-15 ENCOUNTER — Other Ambulatory Visit: Payer: Self-pay

## 2021-01-15 ENCOUNTER — Emergency Department (HOSPITAL_BASED_OUTPATIENT_CLINIC_OR_DEPARTMENT_OTHER)
Admission: EM | Admit: 2021-01-15 | Discharge: 2021-01-15 | Disposition: A | Discharge status: Home / Self Care | Payer: Medicare FFS | Attending: Emergency Medicine | Admitting: Emergency Medicine

## 2021-01-15 ENCOUNTER — Encounter (HOSPITAL_BASED_OUTPATIENT_CLINIC_OR_DEPARTMENT_OTHER): Payer: Self-pay | Admitting: Emergency Medicine

## 2021-01-15 DIAGNOSIS — B029 Zoster without complications: Secondary | ICD-10-CM

## 2021-01-15 DIAGNOSIS — Z79899 Other long term (current) drug therapy: Secondary | ICD-10-CM | POA: Insufficient documentation

## 2021-01-15 DIAGNOSIS — K6289 Other specified diseases of anus and rectum: Secondary | ICD-10-CM | POA: Diagnosis not present

## 2021-01-15 DIAGNOSIS — Z853 Personal history of malignant neoplasm of breast: Secondary | ICD-10-CM | POA: Diagnosis not present

## 2021-01-15 DIAGNOSIS — R21 Rash and other nonspecific skin eruption: Secondary | ICD-10-CM | POA: Diagnosis present

## 2021-01-15 DIAGNOSIS — Z7982 Long term (current) use of aspirin: Secondary | ICD-10-CM | POA: Diagnosis not present

## 2021-01-15 DIAGNOSIS — N184 Chronic kidney disease, stage 4 (severe): Secondary | ICD-10-CM | POA: Insufficient documentation

## 2021-01-15 DIAGNOSIS — I129 Hypertensive chronic kidney disease with stage 1 through stage 4 chronic kidney disease, or unspecified chronic kidney disease: Secondary | ICD-10-CM | POA: Insufficient documentation

## 2021-01-15 MED ORDER — VALACYCLOVIR HCL 1 G PO TABS: 1000.0000 mg | ORAL_TABLET | Freq: Three times a day (TID) | ORAL | 0 refills | Status: DC

## 2021-01-15 NOTE — ED Provider Notes (Signed)
[facility] EMERGENCY DEPARTMENT Provider Note   CSN: [id number] Arrival date & time: 01/15/21  1108     History Chief Complaint  Patient presents with  . Rash    Shirley Wells is a 80 y.o. female.  She is brought in by her daughter for evaluation of a rash on her right buttocks that started last evening.  She started with urinary symptoms a few days ago and is on antibiotics.  She has noticed some pain in her rectal area but attributed it to a hemorrhoid from constipation.  Daughter noticed this rash last night.  No fevers or chills.  No nausea or vomiting.  The history is provided by the patient and a relative.  Rash Location:  Pelvis Pelvic rash location:  Gluteal cleft, R buttock and perineum Quality: redness   Quality comment:  Vesicular Severity:  Moderate Onset quality:  Gradual Timing:  Constant Progression:  Unchanged Chronicity:  New Relieved by:  Nothing Worsened by:  Nothing Ineffective treatments: antibiotics for uti. Associated symptoms: no abdominal pain, no fever, no nausea, no shortness of breath and not vomiting        Past Medical History:  Diagnosis Date  . CHICKENPOX, HX OF 03/21/2010   Qualifier: Diagnosis of  By: Marca Ancona RMA, Lucy    . 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 Dyslipidemia    intol of statin due to >3x increase LFTs  . GERD (gastroesophageal reflux disease)    hx  . Hypertension, essential   . Malignant neoplasm of upper-inner quadrant of left breast in female, estrogen receptor positive ([place]) 06/20/2017  . Osteoarthritis   . Stroke [facility])     Patient Active Problem List   Diagnosis Date Noted  . Hypercalcemia 11/07/2020  . Elevated alkaline phosphatase level 11/07/2020  . Compression fracture of L2 ([place]) 09/14/2020  . Chronic bilateral low back pain 09/12/2020  . Hyperglycemia 02/03/2019  . Malignant neoplasm of upper-inner quadrant  of left breast in female, estrogen receptor positive ([place]) 06/20/2017  . CKD (chronic kidney disease) stage 4, GFR 15-29 ml/min ([facility]) 11/02/2016  . Osteopenia, high frax 04/30/2014  . Edema 09/26/2010  . PEPTIC ULCER DISEASE 03/21/2010  . ARTHRITIS 03/21/2010  . Dyslipidemia 03/20/2010  . Essential hypertension 03/20/2010  . History of CVA (cerebrovascular accident) 02/18/2010    Past Surgical History:  Procedure Laterality Date  . CHOLECYSTECTOMY  1981  . MASTECTOMY W/ SENTINEL NODE BIOPSY Left 07/30/2017   Procedure: LEFT TOTAL MASTECTOMY WITH AXILLARY SENTINEL LYMPH NODE BIOPSY;  Surgeon: Rolm Bookbinder, MD;  Location: [place];  Service: General;  Laterality: Left;     OB History   No obstetric history on file.     Family History  Problem Relation Age of Onset  . Breast cancer Mother 83  . Arthritis Other   . Breast cancer Maternal Aunt     Social History   Tobacco Use  . Smoking status: Never Smoker  . Smokeless tobacco: Never Used  Vaping Use  . Vaping Use: Never used  Substance Use Topics  . Alcohol use: No    Alcohol/week: 0.0 standard drinks  . Drug use: No    Home Medications Prior to Admission medications   Medication Sig Start Date End Date Taking? Authorizing Provider  acetaminophen (TYLENOL) 650 MG CR tablet Take 1,300 mg by mouth every 8 (eight) hours as needed for pain.  [provider]  amLODipine (NORVASC) 5 MG tablet Take 1 tablet (5 mg total) by mouth daily. Patient not taking: No sig reported 02/03/19   Binnie Rail, MD  anastrozole (ARIMIDEX) 1 MG tablet Take 1 tablet (1 mg total) by mouth daily. 12/16/20   Truitt Merle, MD  aspirin 81 MG tablet Take 81 mg by mouth 2 (two) times a week.    [provider]  clopidogrel (PLAVIX) 75 MG tablet TAKE 1 TABLET(75 MG) BY MOUTH DAILY Patient taking differently: Take 75 mg by mouth daily. 12/05/20   Binnie Rail, MD  furosemide (LASIX) 20 MG tablet Take 20 mg by mouth daily as needed  for fluid.    [provider]  hydrALAZINE (APRESOLINE) 25 MG tablet Take 1 tablet (25 mg total) by mouth in the morning and at bedtime. 03/15/20   Binnie Rail, MD  HYDROcodone-acetaminophen (NORCO/VICODIN) 5-325 MG tablet Take 1-2 tablets by mouth every 4 (four) hours as needed for severe pain. 12/16/20   Truitt Merle, MD  lisinopril (ZESTRIL) 10 MG tablet TAKE 1 TABLET(10 MG) BY MOUTH DAILY 12/20/20   Binnie Rail, MD  loratadine (CLARITIN) 10 MG tablet Take 10 mg by mouth daily as needed for allergies.    [provider]  Menthol, Topical Analgesic, (BIOFREEZE EX) Apply 1 application topically daily as needed (muscle pain).    [provider]  mirtazapine (REMERON) 7.5 MG tablet Take 1 tablet (7.5 mg total) by mouth at bedtime. Patient not taking: No sig reported 11/17/20   Alla Feeling, NP  Multiple Vitamin (MULTIVITAMIN) capsule Take 1 capsule by mouth daily. 11/02/16   Binnie Rail, MD  nitrofurantoin, macrocrystal-monohydrate, (MACROBID) 100 MG capsule Take 1 capsule (100 mg total) by mouth 2 (two) times daily. 01/10/21   Hoyt Koch, MD  ondansetron (ZOFRAN) 8 MG tablet Take 1 tablet (8 mg total) by mouth every 8 (eight) hours as needed for nausea or vomiting. 11/16/20   Alla Feeling, NP  palbociclib (IBRANCE) 100 MG tablet Take 1 tablet (100 mg total) by mouth daily. Take for 21 days on, 7 days off, repeat every 28 days. Plan to start on 01/09/21 12/16/20   Truitt Merle, MD  prochlorperazine (COMPAZINE) 10 MG tablet Take 1 tablet (10 mg total) by mouth every 6 (six) hours as needed for nausea or vomiting. 01/06/21   Truitt Merle, MD    Allergies    Influenza vaccines, Pneumococcal vaccines, Simvastatin, Tetanus toxoids, and Zoster vaccine live  Review of Systems   Review of Systems  Constitutional: Negative for fever.  Respiratory: Negative for shortness of breath.   Cardiovascular: Negative for chest pain.  Gastrointestinal: Negative for abdominal  pain, nausea and vomiting.  Genitourinary: Positive for dysuria. Negative for hematuria.  Skin: Positive for rash.    Physical Exam Updated Vital Signs BP 138/66 (BP Location: Right Arm)   Pulse 91   Temp 97.7 F (36.5 C) (Oral)   Resp 18   Ht 5\' 2"  (1.575 m)   Wt 59.9 kg   SpO2 96%   BMI 24.16 kg/m   Physical Exam Vitals and nursing note reviewed.  Constitutional:      Appearance: Normal appearance. She is well-developed and well-nourished.  HENT:     Head: Normocephalic and atraumatic.  Eyes:     Conjunctiva/sclera: Conjunctivae normal.  Pulmonary:     Effort: Pulmonary effort is normal.  Abdominal:     Tenderness: There is no abdominal tenderness. There  is no guarding or rebound.  Genitourinary:    Comments: She has a vesicular rash in a dermatomal distribution starting in her gluteal cleft going across to her right buttock and also involving her perineum and right labia.  Exam done with daughter as chaperone. Musculoskeletal:        General: Normal range of motion.     Cervical back: Neck supple.  Skin:    General: Skin is warm and dry.  Neurological:     General: No focal deficit present.     Mental Status: She is alert.     GCS: GCS eye subscore is 4. GCS verbal subscore is 5. GCS motor subscore is 6.  Psychiatric:        Mood and Affect: Mood and affect normal.     ED Results / Procedures / Treatments   Labs (all labs ordered are listed, but only abnormal results are displayed) Labs Reviewed - No data to display  EKG None  Radiology No results found.  Procedures Procedures   Medications Ordered in ED Medications - No data to display  ED Course  I have reviewed the triage vital signs and the nursing notes.  Pertinent labs & imaging results that were available during my care of the patient were reviewed by me and considered in my medical decision making (see chart for details).    MDM Rules/Calculators/A&P                         80 year old  female here with rash across her buttocks in the setting recent urinary tract infection.  Clinically patient has shingles rash that does involve her perineal area.  Offered to repeat check a urinalysis and daughter declines.  Will treat with valacyclovir.  Return instructions discussed.  Final Clinical Impression(s) / ED Diagnoses Final diagnoses:  Herpes zoster without complication    Rx / DC Orders ED Discharge Orders         Ordered    valACYclovir (VALTREX) 1000 MG tablet  3 times daily,   Status:  Discontinued        01/15/21 1229    valACYclovir (VALTREX) 1000 MG tablet  3 times daily        01/15/21 1332           Hayden Rasmussen, MD 01/15/21 1734

## 2021-01-15 NOTE — ED Triage Notes (Signed)
Pt reports possible shingles on her right buttocks area. Pt also has urinary tract infection and has been on antibotics since Thursday. Pt recently finished radiation treatments.

## 2021-01-15 NOTE — Discharge Instructions (Signed)
You are seen in the emergency department for evaluation of a rash.  These lesions are likely shingles.  This is treated with an antivirus medication.  You can take Tylenol as needed for pain.  Please follow-up with your regular doctor.  Return to the emergency department for any worsening or concerning symptoms

## 2021-01-16 ENCOUNTER — Telehealth: Payer: Self-pay

## 2021-01-16 ENCOUNTER — Ambulatory Visit
Admission: RE | Admit: 2021-01-16 | Discharge: 2021-01-16 | Disposition: A | Discharge status: Home / Self Care | Payer: Medicare FFS | Source: Ambulatory Visit | Attending: Radiation Oncology | Admitting: Radiation Oncology

## 2021-01-16 DIAGNOSIS — C50212 Malignant neoplasm of upper-inner quadrant of left female breast: Secondary | ICD-10-CM | POA: Insufficient documentation

## 2021-01-16 DIAGNOSIS — Z170 Estrogen receptor positive status [ER+]: Secondary | ICD-10-CM | POA: Insufficient documentation

## 2021-01-16 NOTE — Telephone Encounter (Signed)
Spoke with patient in regards to post radiation treatment. Patient verbalized understanding to contact radiation department if any post treatment issues arise. TM

## 2021-01-20 NOTE — Progress Notes (Signed)
[place]   [phone] [phone]   Clinic Follow up Note   Patient Care Team: Binnie Rail, MD as PCP - General (Internal Medicine) Garvin Fila, MD as Consulting Physician (Neurology) Rolm Bookbinder, MD as Consulting Physician (General Surgery) Truitt Merle, MD as Consulting Physician (Hematology) Kyung Rudd, MD as Consulting Physician (Radiation Oncology)  Date of Service:  01/23/2021  CHIEF COMPLAINT: F/u of metastatic breast cancer  SUMMARY OF ONCOLOGIC HISTORY: Oncology History Overview Note  Cancer Staging Malignant neoplasm of upper-inner quadrant of left breast in female, estrogen receptor positive ([place]) Staging form: Breast, AJCC 8th Edition - Clinical stage from 06/18/2017: Stage IB (cT2, cN0, cM0, G2, ER: Positive, PR: Positive, HER2: Negative) - Signed by Truitt Merle, MD on 06/24/2017 - Pathologic stage from 07/30/2017: Stage IA (pT2, pN0, cM0, G2, ER: Positive, PR: Positive, HER2: Negative, Oncotype DX score: 23) - Signed by Truitt Merle, MD on 08/23/2017     Malignant neoplasm of upper-inner quadrant of left breast in female, estrogen receptor positive ([place])  06/17/2017 Mammogram   [place] and [facility] diagnostic Breast Tomo Bilateral  IMPRESSION: 1. 3.4 palpable mass in the 10 o'clock position of the left breast is highly suspicious for malignancy. 2. No evidence of malignancy in the right breast. 3. Ultrasound of the left axilla shows normal sized axillary lymph nodes.   06/18/2017 Initial Biopsy   Diagnosis 06/18/17 Breast, left, needle core biopsy, 10:00 o'clock - INVASIVE MAMMARY CARCINOMA. At least G2  The malignant cells are negative for E-Cadherin, supporting a lobular phenotype.   06/18/2017 Initial Diagnosis   Malignant neoplasm of upper-inner quadrant of left breast in female, estrogen receptor positive ([place])   06/18/2017 Receptors her2   ER 95%+, PR 25%+, both strong staining  HER2- Ki67 30%    07/30/2017 Surgery    LEFT TOTAL MASTECTOMY WITH AXILLARY SENTINEL LYMPH NODE BIOPSY by Dr. Donne Hazel on 07/30/17   07/30/2017 Pathology Results   Diagnosis 07/30/17 1. Breast, simple mastectomy, Left Total - INVASIVE LOBULAR CARCINOMA, GRADE 2, SPANNING 3.5 CM. - LOBULAR CARCINOMA IN SITU. - INVASIVE CARCINOMA IS BROADLY 0.1 CM FROM POSTERIOR MARGIN. - PERINEURAL INVASION PRESENT. - SEE ONCOLOGY TABLE. 2. Lymph node, sentinel, biopsy, Left Axillary - ONE OF ONE LYMPH NODES NEGATIVE FOR CARCINOMA (0/1). 3. Lymph node, sentinel, biopsy, Left - ONE OF ONE LYMPH NODES NEGATIVE FOR CARCINOMA (0/1).   07/30/2017 Oncotype testing   Oncotype 07/30/17 Recurrence score is 23, an intermediate risk With a 10-year risk of recurrence at 15% with tamoxifen alone.     11/11/2020 Imaging   MRI Lumbar Spine  IMPRESSION: Diffuse osseous metastases. Chronic inferior L2 endplate deformity with mild height loss.   Likely late subacute pathologic fracture deformities involving the superior L1 endplate and right L3 lamina.   Multilevel spondylosis. Moderate to severe spinal canal and neural foraminal narrowing at the L3-4 and L4-5 levels.   Mild to moderate spinal canal and neural foraminal narrowing at the L5-S1 level.   11/28/2020 PET scan   IMPRESSION: 1. Widespread and diffuse hypermetabolic bony metastases. 2. No evidence for soft tissue metastases in the neck, chest, abdomen, or pelvis. 3.  Aortic Atherosclerois (ICD10-170.0)     11/29/2020 -  Chemotherapy   Zometa starting 11/29/20. Given her CKD will only give when she has hypercalcemia.      12/04/2020 Imaging   MRI Brain  IMPRESSION: No evidence of intracranial metastatic disease.   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  Mild chronic microvascular ischemic changes. Small chronic right cerebellar infarct.     12/07/2020 - 12/21/2020 Radiation Therapy   palliative RT to left hip with Dr Lisbeth Renshaw on 12/07/20-12/21/20   12/09/2020 Pathology Results    FINAL MICROSCOPIC DIAGNOSIS:   A. BONE, BIOPSY:  - Metastatic carcinoma.  - See comment.   COMMENT:  The morphology is compatible with metastatic lobular breast carcinoma.  Estrogen receptor, progesterone receptor and HER2/neu will be performed.   ADDENDUM:   PROGNOSTIC INDICATOR RESULTS:   Immunohistochemical and morphometric analysis performed manually   The tumor cells are EQUIVOCAL for Her2 (2+).  Her2 by FISH will be performed and the results will be reported  separately.   Estrogen Receptor:       POSITIVE, 50%, MODERATE-STRONG STAINING  Progesterone Receptor:   POSITIVE, 15%, MODERATE-STRONG STAINING    11/2020 -  Anti-estrogen oral therapy   First-line Anti-estrogen therapy Anastrozole 83m daily starting in 11/2020 and Ibrance starting in 12/2020    12/2020 -  Chemotherapy   First-line Anti-estrogen therapy Anastrozole starting in 11/2020 and Ibrance 3 weeks on/1 weeks off starting in 12/2020        CURRENT THERAPY:  Zometa starting 11/29/20. Given her CKD will only give when she has hypercalcemia. PendingFirst-line Anti-estrogen therapy Anastrozole 183mdaily starting end of 11/2020 and Ibrance 3 weeks on/1 weeks off starting beginning of 01/09/21  INTERVAL HISTORY:  RoATAVIA POPPEs here for a follow up. She presents to the clinic with her daughter. She notes her lower back pain is better on Norco 1-4 times a day. She notes she is still nauseous daily. She did vomit twice last week. She takes antiemetics in the morning before eating, she take her cancer medication with lunch.  She notes her appetite is fair but makes herself eat and able to maintain weight. She notes she had shingles of her buttocks. This has improved recently.    REVIEW OF SYSTEMS:   Constitutional: Denies fevers, chills or abnormal weight loss Eyes: Denies blurriness of vision Ears, nose, mouth, throat, and face: Denies mucositis or sore throat Respiratory: Denies cough, dyspnea or  wheezes Cardiovascular: Denies palpitation, chest discomfort or lower extremity swelling Gastrointestinal:  Denies heartburn or change in bowel habits (+) Nausea and vomiting  Skin: Denies abnormal skin rashes (+) Shingles rash of buttock  Lymphatics: Denies new lymphadenopathy or easy bruising Neurological:Denies numbness, tingling or new weaknesses Behavioral/Psych: Mood is stable, no new changes  All other systems were reviewed with the patient and are negative.  MEDICAL HISTORY:  Past Medical History:  Diagnosis Date  . CHICKENPOX, HX OF 03/21/2010   Qualifier: Diagnosis of  By: BrMarca AnconaMA, Lucy    . 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 Dyslipidemia    intol of statin due to >3x increase LFTs  . GERD (gastroesophageal reflux disease)    hx  . Hypertension, essential   . Malignant neoplasm of upper-inner quadrant of left breast in female, estrogen receptor positive [place]  . Osteoarthritis   [facility]    SURGICAL HISTORY: Past Surgical History:  Procedure Laterality Date  . CHOLECYSTECTOMY  1981  . MASTECTOMY W/ SENTINEL NODE BIOPSY Left 07/30/2017   Procedure: LEFT TOTAL MASTECTOMY WITH AXILLARY SENTINEL LYMPH NODE BIOPSY;  Surgeon: WaRolm BookbinderMD;  [place]: [place];  Laterality: Left;    I have reviewed the social  history and family history with the patient and they are unchanged from previous note.  ALLERGIES:  is allergic to influenza vaccines, pneumococcal vaccines, simvastatin, tetanus toxoids, and zoster vaccine live.  MEDICATIONS:  Current Outpatient Medications  Medication Sig Dispense Refill  . acetaminophen (TYLENOL) 650 MG CR tablet Take 1,300 mg by mouth every 8 (eight) hours as needed for pain.    Marland Kitchen amLODipine (NORVASC) 5 MG tablet Take 1 tablet (5 mg total) by mouth daily. (Patient not taking: No sig reported) 90 tablet 3  .  anastrozole (ARIMIDEX) 1 MG tablet Take 1 tablet (1 mg total) by mouth daily. 30 tablet 3  . aspirin 81 MG tablet Take 81 mg by mouth 2 (two) times a week.    . clopidogrel (PLAVIX) 75 MG tablet TAKE 1 TABLET(75 MG) BY MOUTH DAILY (Patient taking differently: Take 75 mg by mouth daily.) 90 tablet 1  . furosemide (LASIX) 20 MG tablet Take 20 mg by mouth daily as needed for fluid.    . hydrALAZINE (APRESOLINE) 25 MG tablet Take 1 tablet (25 mg total) by mouth in the morning and at bedtime. 60 tablet 5  . HYDROcodone-acetaminophen (NORCO/VICODIN) 5-325 MG tablet Take 1-2 tablets by mouth every 4 (four) hours as needed for severe pain. 60 tablet 0  . lisinopril (ZESTRIL) 10 MG tablet TAKE 1 TABLET(10 MG) BY MOUTH DAILY 90 tablet 0  . loratadine (CLARITIN) 10 MG tablet Take 10 mg by mouth daily as needed for allergies.    . Menthol, Topical Analgesic, (BIOFREEZE EX) Apply 1 application topically daily as needed (muscle pain).    . mirtazapine (REMERON) 7.5 MG tablet Take 1 tablet (7.5 mg total) by mouth at bedtime. (Patient not taking: No sig reported) 90 tablet 3  . Multiple Vitamin (MULTIVITAMIN) capsule Take 1 capsule by mouth daily.    . nitrofurantoin, macrocrystal-monohydrate, (MACROBID) 100 MG capsule Take 1 capsule (100 mg total) by mouth 2 (two) times daily. 14 capsule 0  . ondansetron (ZOFRAN) 8 MG tablet Take 1 tablet (8 mg total) by mouth every 8 (eight) hours as needed for nausea or vomiting. 30 tablet 3  . palbociclib (IBRANCE) 100 MG tablet Take 1 tablet (100 mg total) by mouth daily. Take for 21 days on, 7 days off, repeat every 28 days. Plan to start on 01/09/21 21 tablet 0  . prochlorperazine (COMPAZINE) 10 MG tablet Take 1 tablet (10 mg total) by mouth every 6 (six) hours as needed for nausea or vomiting. 30 tablet 1  . valACYclovir (VALTREX) 1000 MG tablet Take 1 tablet (1,000 mg total) by mouth 3 (three) times daily. 21 tablet 0   No current facility-administered medications for this  visit.    PHYSICAL EXAMINATION: ECOG PERFORMANCE STATUS: 2 - Symptomatic, <50% confined to bed  Vitals:   01/23/21 1116  BP: 132/67  Pulse: 84  Resp: 14  Temp: 97.9 F (36.6 C)  SpO2: 100%   Filed Weights   01/23/21 1116  Weight: 132 lb 6.4 oz (60.1 kg)    GENERAL:alert, no distress and comfortable SKIN: skin color, texture, turgor are normal, no rashes or significant lesions EYES: normal, Conjunctiva are pink and non-injected, sclera clear NECK: supple, thyroid normal size, non-tender, without nodularity LYMPH:  no palpable lymphadenopathy in the cervical, axillary  LUNGS: clear to auscultation and percussion with normal breathing effort HEART: regular rate & rhythm and no murmurs and no lower extremity edema ABDOMEN:abdomen soft, non-tender and normal bowel sounds Musculoskeletal:no cyanosis of digits and no  clubbing  NEURO: alert & oriented x 3 with fluent speech, no focal motor/sensory deficits  LABORATORY DATA:  I have reviewed the data as listed CBC Latest Ref Rng & Units 01/23/2021 12/29/2020 12/16/2020  WBC 4.0 - 10.5 K/uL 1.9(L) 4.6 4.5  Hemoglobin 12.0 - 15.0 g/dL 9.3(L) 10.0(L) 9.7(L)  Hematocrit 36.0 - 46.0 % 29.1(L) 31.9(L) 30.9(L)  Platelets 150 - 400 K/uL 125(L) 180 247     CMP Latest Ref Rng & Units 01/23/2021 12/29/2020 12/16/2020  Glucose 70 - 99 mg/dL 117(H) 147(H) 109(H)  BUN 8 - 23 mg/dL 17 33(H) 33(H)  Creatinine 0.44 - 1.00 mg/dL 1.76(H) 1.76(H) 1.65(H)  Sodium 135 - 145 mmol/L 140 143 142  Potassium 3.5 - 5.1 mmol/L 5.0 4.5 5.5(H)  Chloride 98 - 111 mmol/L 111 110 111  CO2 22 - 32 mmol/L 19(L) 23 19(L)  Calcium 8.9 - 10.3 mg/dL 8.8(L) 8.8(L) 8.9  Total Protein 6.5 - 8.1 g/dL 7.3 7.1 7.7  Total Bilirubin 0.3 - 1.2 mg/dL 0.4 0.3 0.3  Alkaline Phos 38 - 126 U/L 844(H) 474(H) 438(H)  AST 15 - 41 U/L 19 34 44(H)  ALT 0 - 44 U/L _0 RADIOGRAPHIC STUDIES: I have personally reviewed the radiological images as listed and agreed with the  findings in the report. No results found.   ASSESSMENT & PLAN:  Shirley Wells is a 80 y.o. female with    1. 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 She was treated with left total mastectomy. She did not require radiation and declinedantiestrogen therapydue toconcerns ofside effects. She lost follow up after 07/2017. -After 6 months of left hip and ribcage pain she was found to have diffuse bone metastasis on 11/28/20 PET. This was confirmed on 12/09/20 bone biopsy which showedmetastatic carcinoma compatible from prior breast cancer, ER50%+,PR15%positive and HER2 negative.  -Given her metastatic breast cancer, her cancer is no longer curable but still treatable. I started her on first-line Anastrozole in 11/2020 and Ibrance 174m3 weeks on/1 week off starting 01/09/21.  -She is tolerating treatment moderately well. She still has nausea which started before treatment. I reviewed management with her.  -Labs reviewed, WBC 1.9, Hg 9.3, plt 125K, ANC 0.9. Still adequate to proceed with week 3 of cycle.  -I discussed reducing her Ibrance to 768mstarting with C2 on 3/14 due to her cytopenia and fatigue  -F/u in 2 weeks with start of C2.    2.Diffuse bone metastasis,L1/L3 fracture,left hip and rib cage pain,hypercalcemia -Shepresented with significant left hip and rib cage pain for 6 months,lumbar MRI showed diffuse bone metastasisand subacute pathologic fracture of L1 and L3. -Her 1/3/22PET scan showed diffuse bone metastasis in pelvis,femur, spine, and ribs. No visceralmetastasis.Her bone biopsy from 12/09/20 showed metastatic carcinoma compatible from prior breast cancer. -Due to her severe pain in the left hip, shestarted palliative RT with Dr MoLisbeth Renshawn 12/07/20-12/21/20. Radiation has  improved her pain.  -She startedZometaon 11/29/20, but given her CKD this has been held. I discussed the option of Prolia when her CKD improves.  -She is not having significant back pain or tendernessfromher lumbar fracture, Dr. WaThurnell Garbeot recommend osteocoolorkyphoplasty at this point. -For her rib cage and left hip pain she is on Noroc 2-3 tabs a day. She also uses Tylenol.  -I discussed proceeding with PT  at home  3. Anorexia, weight loss,nausea,weakness/fatigue -I recommend to ambulate with a cane at home due to her back pain and generalized weakness.  -She can increased Mirtazapine which has helped her sleep but not her appetite. She can try again, and if not helping she can stop.  -I encouraged her to increase Ensure to 2-3 bottles at half bottle at a time. She is mostly eating broths/soups. So far able to maintain weight.  -Her nausea has persisted on Zofran and compazine. She can try both together.   4.Chroniccomorbidities:Pre-DM, CKDstage III, history of CVA, HL, osteopenia -On amlodipine, clopidogrel, hydralazine, aspirin, furosemide, lisinopril, -She has had osteoporosis as seen on 2016 and 08/2017 DEXA. Her 08/2020 DEXA improved to osteopenia.Due to hypercalcemia,willhold calcium for now, she can continue vitamin D -Continue to F/u withPCP Dr. Billey Gosling  5. Anemia, Pancytopenia  -Inially mild anemia in 08/2020, now moderate in the past 2 weeks. This is likely related to bone mets.  -Continue oral iron.  -She has pancytopenia from Ibrance with WBC 1.9, Hg 9.3, plt 125K, ANC 0.9 (01/23/21). Will reduce dose from C2.Ibrance     PLAN: -Continue Anastrozole  -Continue cycle 1 Ibrance 165m daily for one more week then off 1 week  -will call in Ibrance 743mdaily, 3 weeks on and 1 week off, start cycle 2 in 2 weeks  -Lab and f/u in 2 weeks with NP LaRegan Rakers-Send home PT referral.    No problem-specific Assessment & Plan notes found for this  encounter.   Orders Placed This Encounter  Procedures  . Ambulatory referral to Home Health    Referral Priority:   Routine    Referral Type:   Home Health Care    Referral Reason:   Specialty Services Required    Requested Specialty:   [facility]  Number of Visits Requested:   1   All questions were answered. The patient knows to call the clinic with any problems, questions or concerns. No barriers to learning was detected. The total time spent in the appointment was 30 minutes.     YaTruitt MerleMD 01/23/2021   I, AmJoslyn Devonam acting as scribe for YaTruitt MerleMD.   I have reviewed the above documentation for accuracy and completeness, and I agree with the above.

## 2021-01-23 ENCOUNTER — Encounter: Payer: Self-pay | Admitting: Hematology

## 2021-01-23 ENCOUNTER — Other Ambulatory Visit: Payer: Self-pay

## 2021-01-23 ENCOUNTER — Telehealth: Payer: Self-pay | Admitting: Internal Medicine

## 2021-01-23 ENCOUNTER — Inpatient Hospital Stay: Payer: Medicare FFS

## 2021-01-23 ENCOUNTER — Other Ambulatory Visit: Payer: Self-pay | Admitting: Hematology

## 2021-01-23 ENCOUNTER — Inpatient Hospital Stay: Payer: Medicare FFS | Admitting: Hematology

## 2021-01-23 VITALS — BP 132/67 | HR 84 | Temp 97.9°F | Resp 14 | Ht 62.0 in | Wt 132.4 lb

## 2021-01-23 DIAGNOSIS — Z170 Estrogen receptor positive status [ER+]: Secondary | ICD-10-CM | POA: Diagnosis not present

## 2021-01-23 DIAGNOSIS — C50212 Malignant neoplasm of upper-inner quadrant of left female breast: Secondary | ICD-10-CM | POA: Diagnosis not present

## 2021-01-23 DIAGNOSIS — C7951 Secondary malignant neoplasm of bone: Secondary | ICD-10-CM | POA: Diagnosis not present

## 2021-01-23 LAB — COMPREHENSIVE METABOLIC PANEL
ALT: 11 U/L (ref 0–44)
AST: 19 U/L (ref 15–41)
Albumin: 3.5 g/dL (ref 3.5–5.0)
Alkaline Phosphatase: 844 U/L — ABNORMAL HIGH (ref 38–126)
Anion gap: 10 (ref 5–15)
BUN: 17 mg/dL (ref 8–23)
CO2: 19 mmol/L — ABNORMAL LOW (ref 22–32)
Calcium: 8.8 mg/dL — ABNORMAL LOW (ref 8.9–10.3)
Chloride: 111 mmol/L (ref 98–111)
Creatinine, Ser: 1.76 mg/dL — ABNORMAL HIGH (ref 0.44–1.00)
GFR, Estimated: 29 mL/min — ABNORMAL LOW (ref >60)
Glucose, Bld: 117 mg/dL — ABNORMAL HIGH (ref 70–99)
Potassium: 5 mmol/L (ref 3.5–5.1)
Sodium: 140 mmol/L (ref 135–145)
Total Bilirubin: 0.4 mg/dL (ref 0.3–1.2)
Total Protein: 7.3 g/dL (ref 6.5–8.1)

## 2021-01-23 LAB — CBC WITH DIFFERENTIAL/PLATELET
Abs Immature Granulocytes: 0 10*3/uL (ref 0.00–0.07)
Basophils Absolute: 0 10*3/uL (ref 0.0–0.1)
Basophils Relative: 1 %
Eosinophils Absolute: 0.1 10*3/uL (ref 0.0–0.5)
Eosinophils Relative: 3 %
HCT: 29.1 % — ABNORMAL LOW (ref 36.0–46.0)
Hemoglobin: 9.3 g/dL — ABNORMAL LOW (ref 12.0–15.0)
Immature Granulocytes: 0 %
Lymphocytes Relative: 45 %
Lymphs Abs: 0.9 10*3/uL (ref 0.7–4.0)
MCH: 34.4 pg — ABNORMAL HIGH (ref 26.0–34.0)
MCHC: 32 g/dL (ref 30.0–36.0)
MCV: 107.8 fL — ABNORMAL HIGH (ref 80.0–100.0)
Monocytes Absolute: 0.1 10*3/uL (ref 0.1–1.0)
Monocytes Relative: 6 %
Neutro Abs: 0.9 10*3/uL — ABNORMAL LOW (ref 1.7–7.7)
Neutrophils Relative %: 45 %
Platelets: 125 10*3/uL — ABNORMAL LOW (ref 150–400)
RBC: 2.7 MIL/uL — ABNORMAL LOW (ref 3.87–5.11)
RDW: 17.7 % — ABNORMAL HIGH (ref 11.5–15.5)
WBC: 1.9 10*3/uL — ABNORMAL LOW (ref 4.0–10.5)
nRBC: 0 % (ref 0.0–0.2)

## 2021-01-23 MED ORDER — PALBOCICLIB 75 MG PO TABS: 75.0000 mg | ORAL_TABLET | Freq: Every day | ORAL | 0 refills | Status: DC

## 2021-01-23 NOTE — Telephone Encounter (Signed)
[employer] [id number] Approved Auth# [id number] Dates: 11.12.21 thru 12.31.22  $300 out of pocket  Ready to schedule

## 2021-01-24 ENCOUNTER — Telehealth: Payer: Self-pay | Admitting: Hematology

## 2021-01-24 ENCOUNTER — Other Ambulatory Visit: Payer: Self-pay | Admitting: Pharmacist

## 2021-01-24 DIAGNOSIS — Z170 Estrogen receptor positive status [ER+]: Secondary | ICD-10-CM

## 2021-01-24 DIAGNOSIS — C50212 Malignant neoplasm of upper-inner quadrant of left female breast: Secondary | ICD-10-CM

## 2021-01-24 MED ORDER — PALBOCICLIB 75 MG PO TABS: 75.0000 mg | ORAL_TABLET | Freq: Every day | ORAL | 0 refills | Status: DC

## 2021-01-24 NOTE — Telephone Encounter (Signed)
I think oncology is handling this at this point, which may be more ideal.

## 2021-01-24 NOTE — Progress Notes (Signed)
Oral Oncology Pharmacist Encounter  Patient enrolled in manufacturer assistance and receives medication through [place] [facility] Patient Assistance. Prescription redirected to [facility].  Leron Croak, PharmD, BCPS Hematology/Oncology Clinical Pharmacist [facility] [phone] 01/24/2021 7:52 AM

## 2021-01-24 NOTE — Telephone Encounter (Signed)
Left message with follow-up appointment per 2/28 los. Gave option to call back to reschedule if needed. 

## 2021-01-26 NOTE — Telephone Encounter (Signed)
Called patient and left message via my-chart

## 2021-01-29 ENCOUNTER — Other Ambulatory Visit: Payer: Self-pay | Admitting: Hematology

## 2021-01-29 DIAGNOSIS — Z170 Estrogen receptor positive status [ER+]: Secondary | ICD-10-CM

## 2021-01-29 DIAGNOSIS — C50212 Malignant neoplasm of upper-inner quadrant of left female breast: Secondary | ICD-10-CM

## 2021-01-31 MED ORDER — PALBOCICLIB 75 MG PO TABS: 75.0000 mg | ORAL_TABLET | Freq: Every day | ORAL | 0 refills | Status: DC

## 2021-02-05 NOTE — Progress Notes (Signed)
[place]   [phone] [phone]   Clinic Follow up Note   Patient Care Team: Binnie Rail, MD as PCP - General (Internal Medicine) Garvin Fila, MD as Consulting Physician (Neurology) Rolm Bookbinder, MD as Consulting Physician (General Surgery) Truitt Merle, MD as Consulting Physician (Hematology) Kyung Rudd, MD as Consulting Physician (Radiation Oncology) 02/06/2021  CHIEF COMPLAINT: F/up metastatic breast cancer   SUMMARY OF ONCOLOGIC HISTORY: Oncology History Overview Note  Cancer Staging Malignant neoplasm of upper-inner quadrant of left breast in female, estrogen receptor positive ([place]) Staging form: Breast, AJCC 8th Edition - Clinical stage from 06/18/2017: Stage IB (cT2, cN0, cM0, G2, ER: Positive, PR: Positive, HER2: Negative) - Signed by Truitt Merle, MD on 06/24/2017 - Pathologic stage from 07/30/2017: Stage IA (pT2, pN0, cM0, G2, ER: Positive, PR: Positive, HER2: Negative, Oncotype DX score: 23) - Signed by Truitt Merle, MD on 08/23/2017     Malignant neoplasm of upper-inner quadrant of left breast in female, estrogen receptor positive ([place])  06/17/2017 Mammogram   [place] and [facility] diagnostic Breast Tomo Bilateral  IMPRESSION: 1. 3.4 palpable mass in the 10 o'clock position of the left breast is highly suspicious for malignancy. 2. No evidence of malignancy in the right breast. 3. Ultrasound of the left axilla shows normal sized axillary lymph nodes.   06/18/2017 Initial Biopsy   Diagnosis 06/18/17 Breast, left, needle core biopsy, 10:00 o'clock - INVASIVE MAMMARY CARCINOMA. At least G2  The malignant cells are negative for E-Cadherin, supporting a lobular phenotype.   06/18/2017 Initial Diagnosis   Malignant neoplasm of upper-inner quadrant of left breast in female, estrogen receptor positive ([place])   06/18/2017 Receptors her2   ER 95%+, PR 25%+, both strong staining  HER2- Ki67 30%    07/30/2017 Surgery   LEFT TOTAL MASTECTOMY  WITH AXILLARY SENTINEL LYMPH NODE BIOPSY by Dr. Donne Hazel on 07/30/17   07/30/2017 Pathology Results   Diagnosis 07/30/17 1. Breast, simple mastectomy, Left Total - INVASIVE LOBULAR CARCINOMA, GRADE 2, SPANNING 3.5 CM. - LOBULAR CARCINOMA IN SITU. - INVASIVE CARCINOMA IS BROADLY 0.1 CM FROM POSTERIOR MARGIN. - PERINEURAL INVASION PRESENT. - SEE ONCOLOGY TABLE. 2. Lymph node, sentinel, biopsy, Left Axillary - ONE OF ONE LYMPH NODES NEGATIVE FOR CARCINOMA (0/1). 3. Lymph node, sentinel, biopsy, Left - ONE OF ONE LYMPH NODES NEGATIVE FOR CARCINOMA (0/1).   07/30/2017 Oncotype testing   Oncotype 07/30/17 Recurrence score is 23, an intermediate risk With a 10-year risk of recurrence at 15% with tamoxifen alone.     11/11/2020 Imaging   MRI Lumbar Spine  IMPRESSION: Diffuse osseous metastases. Chronic inferior L2 endplate deformity with mild height loss.   Likely late subacute pathologic fracture deformities involving the superior L1 endplate and right L3 lamina.   Multilevel spondylosis. Moderate to severe spinal canal and neural foraminal narrowing at the L3-4 and L4-5 levels.   Mild to moderate spinal canal and neural foraminal narrowing at the L5-S1 level.   11/28/2020 PET scan   IMPRESSION: 1. Widespread and diffuse hypermetabolic bony metastases. 2. No evidence for soft tissue metastases in the neck, chest, abdomen, or pelvis. 3.  Aortic Atherosclerois (ICD10-170.0)     11/29/2020 -  Chemotherapy   Zometa starting 11/29/20. Given her CKD will only give when she has hypercalcemia.      12/04/2020 Imaging   MRI Brain  IMPRESSION: No evidence of intracranial metastatic disease.   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   Mild chronic microvascular ischemic changes.  Small chronic right cerebellar infarct.     12/07/2020 - 12/21/2020 Radiation Therapy   palliative RT to left hip with Dr Lisbeth Renshaw on 12/07/20-12/21/20   12/09/2020 Pathology Results   FINAL MICROSCOPIC  DIAGNOSIS:   A. BONE, BIOPSY:  - Metastatic carcinoma.  - See comment.   COMMENT:  The morphology is compatible with metastatic lobular breast carcinoma.  Estrogen receptor, progesterone receptor and HER2/neu will be performed.   ADDENDUM:   PROGNOSTIC INDICATOR RESULTS:   Immunohistochemical and morphometric analysis performed manually   The tumor cells are EQUIVOCAL for Her2 (2+).  Her2 by FISH will be performed and the results will be reported  separately.   Estrogen Receptor:       POSITIVE, 50%, MODERATE-STRONG STAINING  Progesterone Receptor:   POSITIVE, 15%, MODERATE-STRONG STAINING    11/2020 -  Anti-estrogen oral therapy   First-line Anti-estrogen therapy Anastrozole 38m daily starting in 11/2020 and Ibrance starting in 12/2020    12/2020 -  Chemotherapy   First-line Anti-estrogen therapy Anastrozole starting in 11/2020 and Ibrance 3 weeks on/1 weeks off starting in 12/2020       CURRENT THERAPY:  -Zometa starting 11/29/20. Given her CKD will only give when she has hypercalcemia. -First-line Anti-estrogen therapy Anastrozole 152mdaily starting end of 1/2022and Ibrance 3 weeks on/1 weeks off starting beginning of 01/09/21 -plan to reduce Ibrance to 75 mg starting C2 due to cytopenia and fatigue   INTERVAL HISTORY: Ms. LaByuseturns for f/up before cycle 2 Ibrance as scheduled. She continues anastrozole.  She has continued to struggle with low appetite, metallic taste, and nausea/vomiting after meals.  She tried Zofran, Compazine, and mirtazapine which have been unhelpful.  She is now eating CBD Gummies which is helping her appetite and keeps her from throwing up food.  The metallic taste improved on her week off.  Bowels moving well.  She has increased low back pain if she is standing for long periods of time but overall this pain has improved on treatment.  The shingles rash on her buttock has scabbed over, no residual nerve pain.  Denies mucositis, fever, chills, cough, chest  pain, dyspnea.  He does have occasional "shakes," but none in the past few days.   MEDICAL HISTORY:  Past Medical History:  Diagnosis Date  . CHICKENPOX, HX OF 03/21/2010   Qualifier: Diagnosis of  By: BrMarca AnconaMA, Lucy    . 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 Dyslipidemia    intol of statin due to >3x increase LFTs  . GERD (gastroesophageal reflux disease)    hx  . Hypertension, essential   . Malignant neoplasm of upper-inner quadrant of left breast in female, estrogen receptor positive ([place]  . Osteoarthritis   . [facility]    SURGICAL HISTORY: Past Surgical History:  Procedure Laterality Date  . CHOLECYSTECTOMY  1981  . MASTECTOMY W/ SENTINEL NODE BIOPSY Left 07/30/2017   Procedure: LEFT TOTAL MASTECTOMY WITH AXILLARY SENTINEL LYMPH NODE BIOPSY;  Surgeon: WaRolm BookbinderMD;  [facility] Service: General;  Laterality: Left;    I have reviewed the social history and family history with the patient and they are unchanged from previous note.  ALLERGIES:  is allergic to influenza vaccines, pneumococcal vaccines, simvastatin, tetanus toxoids, and zoster vaccine live.  MEDICATIONS:  Current Outpatient Medications  Medication Sig Dispense Refill  . acetaminophen (TYLENOL) 650 MG CR tablet Take  1,300 mg by mouth every 8 (eight) hours as needed for pain.    Marland Kitchen amLODipine (NORVASC) 5 MG tablet Take 1 tablet (5 mg total) by mouth daily. (Patient not taking: No sig reported) 90 tablet 3  . anastrozole (ARIMIDEX) 1 MG tablet Take 1 tablet (1 mg total) by mouth daily. 30 tablet 3  . aspirin 81 MG tablet Take 81 mg by mouth 2 (two) times a week.    . clopidogrel (PLAVIX) 75 MG tablet TAKE 1 TABLET(75 MG) BY MOUTH DAILY (Patient taking differently: Take 75 mg by mouth daily.) 90 tablet 1  . furosemide (LASIX) 20 MG tablet Take 20 mg by mouth daily as needed for fluid.    . hydrALAZINE  (APRESOLINE) 25 MG tablet Take 1 tablet (25 mg total) by mouth in the morning and at bedtime. 60 tablet 5  . HYDROcodone-acetaminophen (NORCO/VICODIN) 5-325 MG tablet Take 1-2 tablets by mouth every 4 (four) hours as needed for severe pain. 60 tablet 0  . lisinopril (ZESTRIL) 10 MG tablet TAKE 1 TABLET(10 MG) BY MOUTH DAILY 90 tablet 0  . loratadine (CLARITIN) 10 MG tablet Take 10 mg by mouth daily as needed for allergies.    . Menthol, Topical Analgesic, (BIOFREEZE EX) Apply 1 application topically daily as needed (muscle pain).    . mirtazapine (REMERON) 7.5 MG tablet Take 1 tablet (7.5 mg total) by mouth at bedtime. (Patient not taking: No sig reported) 90 tablet 3  . Multiple Vitamin (MULTIVITAMIN) capsule Take 1 capsule by mouth daily.    . nitrofurantoin, macrocrystal-monohydrate, (MACROBID) 100 MG capsule Take 1 capsule (100 mg total) by mouth 2 (two) times daily. 14 capsule 0  . ondansetron (ZOFRAN) 8 MG tablet Take 1 tablet (8 mg total) by mouth every 8 (eight) hours as needed for nausea or vomiting. 30 tablet 3  . palbociclib (IBRANCE) 75 MG tablet Take 1 tablet (75 mg total) by mouth daily. Take for 21 days on, 7 days off, repeat every 28 days. 21 tablet 0  . prochlorperazine (COMPAZINE) 10 MG tablet Take 1 tablet (10 mg total) by mouth every 6 (six) hours as needed for nausea or vomiting. 30 tablet 1  . valACYclovir (VALTREX) 1000 MG tablet Take 1 tablet (1,000 mg total) by mouth 3 (three) times daily. 21 tablet 0   No current facility-administered medications for this visit.    PHYSICAL EXAMINATION: ECOG PERFORMANCE STATUS: 1 - Symptomatic but completely ambulatory  Vitals:   02/06/21 0945  BP: (!) 129/56  Pulse: 75  Resp: 18  Temp: 97.7 F (36.5 C)  SpO2: 100%   Filed Weights   02/06/21 0945  Weight: 128 lb 14.4 oz (58.5 kg)    GENERAL:alert, no distress and comfortable SKIN: no rash to uncovered skin  EYES: sclera clear LUNGS:  normal breathing effort HEART:  no  lower extremity edema Musculoskeletal: no focal tenderness  NEURO: alert & oriented x 3 with fluent speech  LABORATORY DATA:  I have reviewed the data as listed CBC Latest Ref Rng & Units 02/06/2021 01/23/2021 12/29/2020  WBC 4.0 - 10.5 K/uL 2.0(L) 1.9(L) 4.6  Hemoglobin 12.0 - 15.0 g/dL 9.8(L) 9.3(L) 10.0(L)  Hematocrit 36.0 - 46.0 % 29.7(L) 29.1(L) 31.9(L)  Platelets 150 - 400 K/uL 133(L) 125(L) 180     CMP Latest Ref Rng & Units 02/06/2021 01/23/2021 12/29/2020  Glucose 70 - 99 mg/dL 111(H) 117(H) 147(H)  BUN 8 - 23 mg/dL 17 17 33(H)  Creatinine 0.44 - 1.00 mg/dL 1.59(H) 1.76(H) 1.76(H)  Sodium 135 - 145 mmol/L 141 140 143  Potassium 3.5 - 5.1 mmol/L 4.7 5.0 4.5  Chloride 98 - 111 mmol/L 112(H) 111 110  CO2 22 - 32 mmol/L 20(L) 19(L) 23  Calcium 8.9 - 10.3 mg/dL 8.5(L) 8.8(L) 8.8(L)  Total Protein 6.5 - 8.1 g/dL 6.9 7.3 7.1  Total Bilirubin 0.3 - 1.2 mg/dL 0.5 0.4 0.3  Alkaline Phos 38 - 126 U/L 670(H) 844(H) 474(H)  AST 15 - 41 U/L 16 19 34  ALT 0 - 44 U/L _0 RADIOGRAPHIC STUDIES: I have personally reviewed the radiological images as listed and agreed with the findings in the report. No results found.   ASSESSMENT & PLAN: 80 year old female  1.  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  Oncotype DX 23-intermediate risk -She initially self palpated the mass in 2018, biopsy 06/18/2017 showed invasive mammary carcinoma, cT2N0 stage Ib ER/PR positive, HER-2 negative invasive lobular carcinoma.  -S/p left mastectomy by Dr. Donne Hazel, final path showed pT2N0 stage Ib, ER/PR positive, HER-2 negative, grade 2 invasive lobular breast cancer.   -Oncotype showed intermediate risk 23, which predicts 10-year distant recurrence after 5 years of tamoxifen at 15%.  The benefit of chemotherapy in the intermediate risk group was felt to be small and controversial,  adjuvant chemotherapy was not recommended.  - She did not require postmastectomy radiation.  -Due to high fracture risk on DEXA she was recommended to start adjuvant antiestrogen with tamoxifen in 08/2017,  unbeknownst to [place] she declined and was lost to follow-up after that  -6 months ago she developed low back pain after lifting a heavy table, work-up showed diffuse bone metastasis on PET scan 11/28/2020 and confirmed on 12/09/2020 bone biopsy, showing metastatic carcinoma compatible with prior breast cancer, ER 50% positive, PR 15% positive, HER-2 negative -She began first-line anastrozole in 11/2020 and Ibrance 100 mg 3 weeks on/1 week off in 12/2020.  Tolerated first cycle fairly well with fatigue and cytopenias, dose reduced from cycle 2  2. Anorexia, weight loss, n/v, headache, weakness -secondary to #1 -I recommend to ambulate with a cane at home due to her back pain and generalized weakness -Brain MRI negative for metastasis -She failed mirtazapine, Zofran, and Compazine -She has benefit from CBD Gummies, declined Rx for Marinol today -Continue nutrition supplements  3.  Chronic medical conditions: Pre-DM, CKD, history of CVA, HL, osteopenia -On amlodipine, clopidogrel, hydralazine, aspirin, furosemide, lisinopril, -CVA was around 7 years ago, no residual deficits or recurrent episodes -Due to hypercalcemia, I encouraged her to hold calcium for now, she can continue vitamin D - per PCP Dr. Billey Gosling   Disposition: Ms. Demarinis appears stable.  She completed 1 cycle of Ibrance and continues anastrozole.  She tolerated fairly well with progressive fatigue and cytopenias.  She is able to recover mostly well on her week off, and function well.  A referral to home PT is pending.  Her bone pain has improved on Ibrance, consistent with a clinical response to treatment.  Labs reviewed, ANC 0.8.  We reviewed infection precautions.  Due to her fatigue and cytopenias, we will dose reduce Ibrance with  cycle 2 to 75 mg and delay the start until 3/21.  She will return for lab at the end of 2 weeks to determine subsequent dosing.  If she has persistent neutropenia or severe fatigue we will change  treatment cycle to 2 weeks on/1 week off.   Follow-up in 4 weeks with next Zometa, or sooner if needed.  The plan was reviewed with Dr. Burr Medico.  All questions were answered. The patient knows to call the clinic with any problems, questions or concerns. No barriers to learning was detected.     Alla Feeling, NP 02/06/21

## 2021-02-06 ENCOUNTER — Encounter: Payer: Self-pay | Admitting: Nurse Practitioner

## 2021-02-06 ENCOUNTER — Other Ambulatory Visit: Payer: Self-pay

## 2021-02-06 ENCOUNTER — Inpatient Hospital Stay: Payer: Medicare FFS

## 2021-02-06 ENCOUNTER — Inpatient Hospital Stay: Payer: Medicare FFS | Attending: Hematology | Admitting: Nurse Practitioner

## 2021-02-06 VITALS — BP 129/56 | HR 75 | Temp 97.7°F | Resp 18 | Ht 62.0 in | Wt 128.9 lb

## 2021-02-06 DIAGNOSIS — Z923 Personal history of irradiation: Secondary | ICD-10-CM | POA: Diagnosis not present

## 2021-02-06 DIAGNOSIS — M8580 Other specified disorders of bone density and structure, unspecified site: Secondary | ICD-10-CM | POA: Diagnosis not present

## 2021-02-06 DIAGNOSIS — Z7982 Long term (current) use of aspirin: Secondary | ICD-10-CM | POA: Diagnosis not present

## 2021-02-06 DIAGNOSIS — R531 Weakness: Secondary | ICD-10-CM | POA: Insufficient documentation

## 2021-02-06 DIAGNOSIS — Z79899 Other long term (current) drug therapy: Secondary | ICD-10-CM | POA: Diagnosis not present

## 2021-02-06 DIAGNOSIS — N189 Chronic kidney disease, unspecified: Secondary | ICD-10-CM | POA: Insufficient documentation

## 2021-02-06 DIAGNOSIS — R634 Abnormal weight loss: Secondary | ICD-10-CM | POA: Diagnosis not present

## 2021-02-06 DIAGNOSIS — C50212 Malignant neoplasm of upper-inner quadrant of left female breast: Secondary | ICD-10-CM | POA: Insufficient documentation

## 2021-02-06 DIAGNOSIS — C7951 Secondary malignant neoplasm of bone: Secondary | ICD-10-CM | POA: Diagnosis not present

## 2021-02-06 DIAGNOSIS — Z9012 Acquired absence of left breast and nipple: Secondary | ICD-10-CM | POA: Insufficient documentation

## 2021-02-06 DIAGNOSIS — R630 Anorexia: Secondary | ICD-10-CM | POA: Insufficient documentation

## 2021-02-06 DIAGNOSIS — R519 Headache, unspecified: Secondary | ICD-10-CM | POA: Insufficient documentation

## 2021-02-06 DIAGNOSIS — Z8673 Personal history of transient ischemic attack (TIA), and cerebral infarction without residual deficits: Secondary | ICD-10-CM | POA: Diagnosis not present

## 2021-02-06 DIAGNOSIS — Z170 Estrogen receptor positive status [ER+]: Secondary | ICD-10-CM

## 2021-02-06 LAB — CBC WITH DIFFERENTIAL/PLATELET
Abs Immature Granulocytes: 0.02 10*3/uL (ref 0.00–0.07)
Basophils Absolute: 0 10*3/uL (ref 0.0–0.1)
Basophils Relative: 2 %
Eosinophils Absolute: 0 10*3/uL (ref 0.0–0.5)
Eosinophils Relative: 2 %
HCT: 29.7 % — ABNORMAL LOW (ref 36.0–46.0)
Hemoglobin: 9.8 g/dL — ABNORMAL LOW (ref 12.0–15.0)
Immature Granulocytes: 1 %
Lymphocytes Relative: 48 %
Lymphs Abs: 1 10*3/uL (ref 0.7–4.0)
MCH: 36.3 pg — ABNORMAL HIGH (ref 26.0–34.0)
MCHC: 33 g/dL (ref 30.0–36.0)
MCV: 110 fL — ABNORMAL HIGH (ref 80.0–100.0)
Monocytes Absolute: 0.1 10*3/uL (ref 0.1–1.0)
Monocytes Relative: 6 %
Neutro Abs: 0.8 10*3/uL — ABNORMAL LOW (ref 1.7–7.7)
Neutrophils Relative %: 41 %
Platelets: 133 10*3/uL — ABNORMAL LOW (ref 150–400)
RBC: 2.7 MIL/uL — ABNORMAL LOW (ref 3.87–5.11)
RDW: 20.3 % — ABNORMAL HIGH (ref 11.5–15.5)
WBC: 2 10*3/uL — ABNORMAL LOW (ref 4.0–10.5)
nRBC: 0 % (ref 0.0–0.2)

## 2021-02-06 LAB — COMPREHENSIVE METABOLIC PANEL
ALT: 10 U/L (ref 0–44)
AST: 16 U/L (ref 15–41)
Albumin: 3.6 g/dL (ref 3.5–5.0)
Alkaline Phosphatase: 670 U/L — ABNORMAL HIGH (ref 38–126)
Anion gap: 9 (ref 5–15)
BUN: 17 mg/dL (ref 8–23)
CO2: 20 mmol/L — ABNORMAL LOW (ref 22–32)
Calcium: 8.5 mg/dL — ABNORMAL LOW (ref 8.9–10.3)
Chloride: 112 mmol/L — ABNORMAL HIGH (ref 98–111)
Creatinine, Ser: 1.59 mg/dL — ABNORMAL HIGH (ref 0.44–1.00)
GFR, Estimated: 33 mL/min — ABNORMAL LOW (ref >60)
Glucose, Bld: 111 mg/dL — ABNORMAL HIGH (ref 70–99)
Potassium: 4.7 mmol/L (ref 3.5–5.1)
Sodium: 141 mmol/L (ref 135–145)
Total Bilirubin: 0.5 mg/dL (ref 0.3–1.2)
Total Protein: 6.9 g/dL (ref 6.5–8.1)

## 2021-02-07 ENCOUNTER — Telehealth: Payer: Self-pay | Admitting: Hematology

## 2021-02-07 LAB — PATHOLOGIST SMEAR REVIEW

## 2021-02-07 NOTE — Telephone Encounter (Signed)
Scheduled follow-up appointments per 3/14 los. Patient is aware. ?

## 2021-02-13 ENCOUNTER — Telehealth: Payer: Self-pay | Admitting: Hematology

## 2021-02-13 NOTE — Telephone Encounter (Signed)
Rescheduled 04/01 to 03/31 per patient's request. Patient is aware of new appointment date and time.

## 2021-02-15 ENCOUNTER — Telehealth: Payer: Self-pay

## 2021-02-15 NOTE — Telephone Encounter (Signed)
Nutrition Follow-up:  Patient with metastatic left breast cancer.  Patient taking ibrance and anastrozole.   Spoke with patient via phone.  Patient reports decreased appetite while taking oral chemo.  "My appetite gets better when I don't take that chemo pill."  Reports that she ate biscuit and gravy for breakfast, drank 350 calorie ensure for lunch and eating popcorn for snack while on the phone.  Reports that CBD gummies help her appetite and nausea.  Reports that she ate some banana pudding before bed last night.  "I am trying to eat more."      Medications: noted declined marinol Rx  Labs: reviewed  Anthropometrics:   Weight 128 lb 14.4 on 3/14 decreased from 132 lb on 2/3   NUTRITION DIAGNOSIS: Inadequate oral intake continues as weight continues to decline   INTERVENTION:  Encouraged patient to maximize intake on good days.  Continue 350 calorie shake 2 if possible on most days.    MONITORING, EVALUATION, GOAL: weight trends, intake   NEXT VISIT: Monday, April 11 during infusion  Nickol Collister B. Zenia Resides, [place], [place] Registered Dietitian [phone] (mobile)

## 2021-02-23 ENCOUNTER — Encounter: Payer: Self-pay | Admitting: Hematology

## 2021-02-23 ENCOUNTER — Inpatient Hospital Stay: Payer: Medicare FFS

## 2021-02-23 ENCOUNTER — Other Ambulatory Visit: Payer: Self-pay

## 2021-02-23 DIAGNOSIS — C50212 Malignant neoplasm of upper-inner quadrant of left female breast: Secondary | ICD-10-CM | POA: Diagnosis not present

## 2021-02-23 DIAGNOSIS — Z170 Estrogen receptor positive status [ER+]: Secondary | ICD-10-CM

## 2021-02-23 LAB — COMPREHENSIVE METABOLIC PANEL
ALT: 13 U/L (ref 0–44)
AST: 19 U/L (ref 15–41)
Albumin: 3.3 g/dL — ABNORMAL LOW (ref 3.5–5.0)
Alkaline Phosphatase: 587 U/L — ABNORMAL HIGH (ref 38–126)
Anion gap: 12 (ref 5–15)
BUN: 24 mg/dL — ABNORMAL HIGH (ref 8–23)
CO2: 22 mmol/L (ref 22–32)
Calcium: 8.7 mg/dL — ABNORMAL LOW (ref 8.9–10.3)
Chloride: 109 mmol/L (ref 98–111)
Creatinine, Ser: 1.72 mg/dL — ABNORMAL HIGH (ref 0.44–1.00)
GFR, Estimated: 30 mL/min — ABNORMAL LOW (ref >60)
Glucose, Bld: 98 mg/dL (ref 70–99)
Potassium: 5.2 mmol/L — ABNORMAL HIGH (ref 3.5–5.1)
Sodium: 143 mmol/L (ref 135–145)
Total Bilirubin: 0.4 mg/dL (ref 0.3–1.2)
Total Protein: 7 g/dL (ref 6.5–8.1)

## 2021-02-23 LAB — CBC WITH DIFFERENTIAL/PLATELET
Abs Immature Granulocytes: 0.02 10*3/uL (ref 0.00–0.07)
Basophils Absolute: 0 10*3/uL (ref 0.0–0.1)
Basophils Relative: 2 %
Eosinophils Absolute: 0.2 10*3/uL (ref 0.0–0.5)
Eosinophils Relative: 9 %
HCT: 28.5 % — ABNORMAL LOW (ref 36.0–46.0)
Hemoglobin: 9.3 g/dL — ABNORMAL LOW (ref 12.0–15.0)
Immature Granulocytes: 1 %
Lymphocytes Relative: 30 %
Lymphs Abs: 0.7 10*3/uL (ref 0.7–4.0)
MCH: 36.9 pg — ABNORMAL HIGH (ref 26.0–34.0)
MCHC: 32.6 g/dL (ref 30.0–36.0)
MCV: 113.1 fL — ABNORMAL HIGH (ref 80.0–100.0)
Monocytes Absolute: 0.1 10*3/uL (ref 0.1–1.0)
Monocytes Relative: 4 %
Neutro Abs: 1.3 10*3/uL — ABNORMAL LOW (ref 1.7–7.7)
Neutrophils Relative %: 54 %
Platelets: 212 10*3/uL (ref 150–400)
RBC: 2.52 MIL/uL — ABNORMAL LOW (ref 3.87–5.11)
RDW: 19.7 % — ABNORMAL HIGH (ref 11.5–15.5)
WBC: 2.4 10*3/uL — ABNORMAL LOW (ref 4.0–10.5)
nRBC: 0 % (ref 0.0–0.2)

## 2021-02-23 NOTE — Progress Notes (Signed)
Met with patient and daughter at registration whom requested to speak with me.  She had several bills and was questioning if her insurance had paid. Showed her on the back of the bill where insurance had made payments and the amount shown was her responsibility.   She asked about applying for assistance. Advised that her balances did not reflect the required amount of $5,000 to receive the hardship settlement.  She asked about the [employer] discussed previously. Advised her what was needed to apply and she had proof of income with her.  Patient approved for one-time $1000 [employer] grant to assist with personal expenses while going through treatment. Discussed in detail expenses and how they are covered. Gave her a copy of the approval letter and expense sheet along with the Outpatient pharmacy information. She received a gift card today from grant.  She has my card for any additional financial questions or concerns.

## 2021-02-24 ENCOUNTER — Telehealth: Payer: Self-pay

## 2021-02-24 ENCOUNTER — Other Ambulatory Visit: Payer: Medicare FFS

## 2021-02-24 NOTE — Telephone Encounter (Signed)
-----   Message from Alla Feeling, NP sent at 02/23/2021 11:53 AM EDT ----- Please let her know labs. ANC still below normal but improved. K 5.2, make sure she is having bowel movements and hold K supplement if she is on it (if she takes lasix daily, then only hold K for 3 days then resume). Continue Ibrance to complete 3 weeks on as long as she is feeling well and fatigue is manageable, then 7 days off. We will see her 4/11 as scheduled. Continue infection precautions and call with any red flags.   Thanks, Regan Rakers, NP

## 2021-02-24 NOTE — Telephone Encounter (Signed)
I spoke with Shirley Wells and reviewed Shirley Wells's comments and recommendations.  Shirley Wells states she has been eating bananas.  She said she will stop.  Reviewed neutropenic precautions and reportable symptoms.  She verbalized understanding.

## 2021-03-03 ENCOUNTER — Telehealth: Payer: Self-pay

## 2021-03-03 ENCOUNTER — Other Ambulatory Visit: Payer: Self-pay | Admitting: Hematology

## 2021-03-03 DIAGNOSIS — Z170 Estrogen receptor positive status [ER+]: Secondary | ICD-10-CM

## 2021-03-03 DIAGNOSIS — C50212 Malignant neoplasm of upper-inner quadrant of left female breast: Secondary | ICD-10-CM

## 2021-03-03 MED ORDER — PALBOCICLIB 75 MG PO TABS: 75.0000 mg | ORAL_TABLET | Freq: Every day | ORAL | 2 refills | Status: DC

## 2021-03-03 NOTE — Progress Notes (Signed)
[facility] [facility]   [phone] [phone]   Clinic Follow up Note   Patient Care Team: Binnie Rail, MD as PCP - General (Internal Medicine) Garvin Fila, MD as Consulting Physician (Neurology) Rolm Bookbinder, MD as Consulting Physician (General Surgery) Truitt Merle, MD as Consulting Physician (Hematology) Kyung Rudd, MD as Consulting Physician (Radiation Oncology)  Date of Service:  03/06/2021  CHIEF COMPLAINT: F/u of metastatic breast cancer  SUMMARY OF ONCOLOGIC HISTORY: Oncology History Overview Note  Cancer Staging Malignant neoplasm of upper-inner quadrant of left breast in female, estrogen receptor positive ([place]) Staging form: Breast, AJCC 8th Edition - Clinical stage from 06/18/2017: Stage IB (cT2, cN0, cM0, G2, ER: Positive, PR: Positive, HER2: Negative) - Signed by Truitt Merle, MD on 06/24/2017 - Pathologic stage from 07/30/2017: Stage IA (pT2, pN0, cM0, G2, ER: Positive, PR: Positive, HER2: Negative, Oncotype DX score: 23) - Signed by Truitt Merle, MD on 08/23/2017     Malignant neoplasm of upper-inner quadrant of left breast in female, estrogen receptor positive ([place])  06/17/2017 Mammogram   [place] and MM diagnostic Breast Tomo Bilateral  IMPRESSION: 1. 3.4 palpable mass in the 10 o'clock position of the left breast is highly suspicious for malignancy. 2. No evidence of malignancy in the right breast. 3. Ultrasound of the left axilla shows normal sized axillary lymph nodes.   06/18/2017 Initial Biopsy   Diagnosis 06/18/17 Breast, left, needle core biopsy, 10:00 o'clock - INVASIVE MAMMARY CARCINOMA. At least G2  The malignant cells are negative for E-Cadherin, supporting a lobular phenotype.   06/18/2017 Initial Diagnosis   Malignant neoplasm of upper-inner quadrant of left breast in female, estrogen receptor positive ([place])   06/18/2017 Receptors her2   ER 95%+, PR 25%+, both strong staining  HER2- Ki67 30%    07/30/2017 Surgery    LEFT TOTAL MASTECTOMY WITH AXILLARY SENTINEL LYMPH NODE BIOPSY by Dr. Donne Hazel on 07/30/17   07/30/2017 Pathology Results   Diagnosis 07/30/17 1. Breast, simple mastectomy, Left Total - INVASIVE LOBULAR CARCINOMA, GRADE 2, SPANNING 3.5 CM. - LOBULAR CARCINOMA IN SITU. - INVASIVE CARCINOMA IS BROADLY 0.1 CM FROM POSTERIOR MARGIN. - PERINEURAL INVASION PRESENT. - SEE ONCOLOGY TABLE. 2. Lymph node, sentinel, biopsy, Left Axillary - ONE OF ONE LYMPH NODES NEGATIVE FOR CARCINOMA (0/1). 3. Lymph node, sentinel, biopsy, Left - ONE OF ONE LYMPH NODES NEGATIVE FOR CARCINOMA (0/1).   07/30/2017 Oncotype testing   Oncotype 07/30/17 Recurrence score is 23, an intermediate risk With a 10-year risk of recurrence at 15% with tamoxifen alone.     11/11/2020 Imaging   MRI Lumbar Spine  IMPRESSION: Diffuse osseous metastases. Chronic inferior L2 endplate deformity with mild height loss.   Likely late subacute pathologic fracture deformities involving the superior L1 endplate and right L3 lamina.   Multilevel spondylosis. Moderate to severe spinal canal and neural foraminal narrowing at the L3-4 and L4-5 levels.   Mild to moderate spinal canal and neural foraminal narrowing at the L5-S1 level.   11/28/2020 PET scan   IMPRESSION: 1. Widespread and diffuse hypermetabolic bony metastases. 2. No evidence for soft tissue metastases in the neck, chest, abdomen, or pelvis. 3.  Aortic Atherosclerois (ICD10-170.0)     11/29/2020 -  Chemotherapy   Zometa q33month starting 11/29/20. Given her CKD will only give when she has hypercalcemia.      12/04/2020 Imaging   MRI Brain  IMPRESSION: No evidence of intracranial metastatic disease.   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  Mild chronic microvascular ischemic changes. Small chronic right cerebellar infarct.     12/07/2020 - 12/21/2020 Radiation Therapy   palliative RT to left hip with Dr Lisbeth Renshaw on 12/07/20-12/21/20   12/09/2020  Pathology Results   FINAL MICROSCOPIC DIAGNOSIS:   A. BONE, BIOPSY:  - Metastatic carcinoma.  - See comment.   COMMENT:  The morphology is compatible with metastatic lobular breast carcinoma.  Estrogen receptor, progesterone receptor and HER2/neu will be performed.   ADDENDUM:   PROGNOSTIC INDICATOR RESULTS:   Immunohistochemical and morphometric analysis performed manually   The tumor cells are EQUIVOCAL for Her2 (2+).  Her2 by FISH will be performed and the results will be reported  separately.   Estrogen Receptor:       POSITIVE, 50%, MODERATE-STRONG STAINING  Progesterone Receptor:   POSITIVE, 15%, MODERATE-STRONG STAINING    11/2020 -  Anti-estrogen oral therapy   First-line Anti-estrogen therapy Anastrozole 39m daily starting in 11/2020 and Ibrance starting in 12/2020    01/09/2021 -  Chemotherapy   Ibrance 3 weeks on/1 weeks off starting beginning of 01/09/21. Reduced to 779mstarting with C2 (02/13/21) due to cytopenia and fatigue. Reduced to 755m weeks on/1 week off starting C3 on 03/13/21)       CURRENT THERAPY:  Zometa q3mo63montharting 11/29/20. Given her CKD will only give when she has hypercalcemia and may switch to Prolia.  First-line Anti-estrogen therapy Anastrozole 1mg 67mly starting end of 11/2020+ Ibrance 3 weeks on/1 weeks off starting beginning of 01/09/21. Reduced to 75mg 25mting with C2 (02/13/21) due to cytopenia and fatigue. Reduced to 75mg 264mks on/1 week off starting C3 on 03/13/21)   INTERVAL HISTORY:  Shirley M KRYSTYNE TEWKSBURYe for a follow up of metastatic breast cancer. She was last seen by me 2 months ago and seen by NP Lacie last month in interm. She presents to the clinic with her daughter. She notes she is doing fairly well. She notes she took her last Ibrance this cycle last night. She notes struggled to refill it.  She notes she is not able to eat well. She tried Marinol gummy and made her blurry eyed so she stopped taking it. The gummy did make her  eat better. She notes on her own she is able to eat once 3 days off treatment. She notes having diarrhea with watery stool. She feels this is related to Ensure which she takes in the morning.  She notes her lower back pain has improved. She no longer has chest pain. She notes she takes Tylenol as needed. She notes she has large copay for her scan.    REVIEW OF SYSTEMS:   Constitutional: Denies fevers, chills or abnormal weight loss (+) Low appetite  Eyes: Denies blurriness of vision Ears, nose, mouth, throat, and face: Denies mucositis or sore throat Respiratory: Denies cough, dyspnea or wheezes Cardiovascular: Denies palpitation, chest discomfort or lower extremity swelling Gastrointestinal:  Denies nausea, heartburn (+) Diarrhea  Skin: Denies abnormal skin rashes Lymphatics: Denies new lymphadenopathy or easy bruising Neurological:Denies numbness, tingling or new weaknesses Behavioral/Psych: Mood is stable, no new changes  All other systems were reviewed with the patient and are negative.  MEDICAL HISTORY:  Past Medical History:  Diagnosis Date  . CHICKENPOX, HX OF 03/21/2010   Qualifier: Diagnosis of  By: Brand RMarca [place]    . 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 Dyslipidemia  intol of statin due to >3x increase LFTs  . GERD (gastroesophageal reflux disease)    hx  . Hypertension, essential   . Malignant neoplasm of upper-inner quadrant of left breast in female, estrogen receptor positive ([place]) 06/20/2017  . Osteoarthritis   . Stroke [facility])     SURGICAL HISTORY: Past Surgical History:  Procedure Laterality Date  . CHOLECYSTECTOMY  1981  . MASTECTOMY W/ SENTINEL NODE BIOPSY Left 07/30/2017   Procedure: LEFT TOTAL MASTECTOMY WITH AXILLARY SENTINEL LYMPH NODE BIOPSY;  Surgeon: Rolm Bookbinder, MD;  Location: [facility];  Service: General;  Laterality: Left;    I have reviewed the social history  and family history with the patient and they are unchanged from previous note.  ALLERGIES:  is allergic to influenza vaccines, pneumococcal vaccines, simvastatin, tetanus toxoids, and zoster vaccine live.  MEDICATIONS:  Current Outpatient Medications  Medication Sig Dispense Refill  . acetaminophen (TYLENOL) 650 MG CR tablet Take 1,300 mg by mouth every 8 (eight) hours as needed for pain.    Marland Kitchen amLODipine (NORVASC) 5 MG tablet Take 1 tablet (5 mg total) by mouth daily. (Patient not taking: No sig reported) 90 tablet 3  . anastrozole (ARIMIDEX) 1 MG tablet Take 1 tablet (1 mg total) by mouth daily. 90 tablet 1  . aspirin 81 MG tablet Take 81 mg by mouth 2 (two) times a week.    . clopidogrel (PLAVIX) 75 MG tablet TAKE 1 TABLET(75 MG) BY MOUTH DAILY (Patient taking differently: Take 75 mg by mouth daily.) 90 tablet 1  . furosemide (LASIX) 20 MG tablet Take 20 mg by mouth daily as needed for fluid.    . hydrALAZINE (APRESOLINE) 25 MG tablet Take 1 tablet (25 mg total) by mouth in the morning and at bedtime. 60 tablet 5  . HYDROcodone-acetaminophen (NORCO/VICODIN) 5-325 MG tablet Take 1-2 tablets by mouth every 4 (four) hours as needed for severe pain. 60 tablet 0  . lisinopril (ZESTRIL) 10 MG tablet TAKE 1 TABLET(10 MG) BY MOUTH DAILY 90 tablet 0  . loratadine (CLARITIN) 10 MG tablet Take 10 mg by mouth daily as needed for allergies.    . Menthol, Topical Analgesic, (BIOFREEZE EX) Apply 1 application topically daily as needed (muscle pain).    . mirtazapine (REMERON) 7.5 MG tablet Take 1 tablet (7.5 mg total) by mouth at bedtime. (Patient not taking: No sig reported) 90 tablet 3  . Multiple Vitamin (MULTIVITAMIN) capsule Take 1 capsule by mouth daily.    . nitrofurantoin, macrocrystal-monohydrate, (MACROBID) 100 MG capsule Take 1 capsule (100 mg total) by mouth 2 (two) times daily. 14 capsule 0  . ondansetron (ZOFRAN) 8 MG tablet Take 1 tablet (8 mg total) by mouth every 8 (eight) hours as needed  for nausea or vomiting. 30 tablet 3  . palbociclib (IBRANCE) 75 MG tablet Take 1 tablet (75 mg total) by mouth daily. Take for 14 days on, 7 days off, repeat every 21 days. 14 tablet 2  . prochlorperazine (COMPAZINE) 10 MG tablet Take 1 tablet (10 mg total) by mouth every 6 (six) hours as needed for nausea or vomiting. 30 tablet 1  . valACYclovir (VALTREX) 1000 MG tablet Take 1 tablet (1,000 mg total) by mouth 3 (three) times daily. 21 tablet 0   No current facility-administered medications for this visit.    PHYSICAL EXAMINATION: ECOG PERFORMANCE STATUS: 2 - Symptomatic, <50% confined to bed  Vitals:   03/06/21 0929  BP: 133/75  Pulse: 89  Resp: 18  Temp: 98.2  F (36.8 C)  SpO2: 100%   Filed Weights   03/06/21 0929  Weight: 127 lb 12.8 oz (58 kg)    GENERAL:alert, no distress and comfortable SKIN: skin color, texture, turgor are normal, no rashes or significant lesions EYES: normal, Conjunctiva are pink and non-injected, sclera clear  NECK: supple, thyroid normal size, non-tender, without nodularity LYMPH:  no palpable lymphadenopathy in the cervical, axillary  LUNGS: clear to auscultation and percussion with normal breathing effort HEART: regular rate & rhythm and no murmurs and no lower extremity edema ABDOMEN:abdomen soft, non-tender and normal bowel sounds Musculoskeletal:no cyanosis of digits and no clubbing  NEURO: alert & oriented x 3 with fluent speech, no focal motor/sensory deficits  LABORATORY DATA:  I have reviewed the data as listed CBC Latest Ref Rng & Units 03/06/2021 02/23/2021 02/06/2021  WBC 4.0 - 10.5 K/uL 1.6(L) 2.4(L) 2.0(L)  Hemoglobin 12.0 - 15.0 g/dL 9.1(L) 9.3(L) 9.8(L)  Hematocrit 36.0 - 46.0 % 27.5(L) 28.5(L) 29.7(L)  Platelets 150 - 400 K/uL 132(L) 212 133(L)     CMP Latest Ref Rng & Units 02/23/2021 02/06/2021 01/23/2021  Glucose 70 - 99 mg/dL 98 111(H) 117(H)  BUN 8 - 23 mg/dL 24(H) 17 17  Creatinine 0.44 - 1.00 mg/dL 1.72(H) 1.59(H) 1.76(H)   Sodium 135 - 145 mmol/L 143 141 140  Potassium 3.5 - 5.1 mmol/L 5.2(H) 4.7 5.0  Chloride 98 - 111 mmol/L 109 112(H) 111  CO2 22 - 32 mmol/L 22 20(L) 19(L)  Calcium 8.9 - 10.3 mg/dL 8.7(L) 8.5(L) 8.8(L)  Total Protein 6.5 - 8.1 g/dL 7.0 6.9 7.3  Total Bilirubin 0.3 - 1.2 mg/dL 0.4 0.5 0.4  Alkaline Phos 38 - 126 U/L 587(H) 670(H) 844(H)  AST 15 - 41 U/L _0 ALT 0 - 44 U/L _1 RADIOGRAPHIC STUDIES: I have personally reviewed the radiological images as listed and agreed with the findings in the report. No results found.   ASSESSMENT & PLAN:  Shirley Wells is a 80 y.o. female with    1. 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 She was treated with left total mastectomy. She did not require radiation and declinedantiestrogen therapydue toconcerns ofside effects. She lost follow up after 07/2017. -After 6 months of left hip and ribcage pain she was found to have diffuse bone metastasis on 11/28/20 PET. This was confirmed on 12/09/20 bone biopsy which showedmetastatic carcinoma compatible from prior breast cancer, ER50%+,PR15%positive and HER2negative.  -Given her metastatic breast cancer, her cancer is no longer curable but still treatable. Istarted her on first-lineAnastrozole in 11/2020 and Ibrance 133m3 weeks on/1 week off starting 01/09/21.Reduced to 785mstarting with C2 due to cytopenia and fatigue.  -She is moderately tolerating treatment with more fatigue and low appetite. She is able to maintain most of her weight. Will reduce Ibrance to 7568m weeks on/1 week off starting with C3 on 03/13/21.  -Labs reviewed, WBC 1.6, Hg 9.1, plt 132K, ANC 0.7. Will continue Ibrance but with reduce dose starting with C3 on 03/13/21.  -Continue Anastrozole once daily  -F/u in 3 weeks with  next PET scan results.   2.Diffuse bone metastasis,L1/L3 fracture,left hip and rib cage pain,hypercalcemia -Shepresented with significant left hip and rib cage pain for 6 months,lumbar MRI showed diffuse bone metastasisand subacute pathologic fracture of L1  and L3. -Her 1/3/22PET scan showed diffuse bone metastasis in pelvis,femur, spine, and ribs. No visceralmetastasis.Her bone biopsy from 12/09/20 showed metastatic carcinoma compatible from prior breast cancer. -She completed palliative RT with Dr Lisbeth Renshaw on 12/07/20-12/21/20 for severe left hip pain. Radiationhas improved her pain.  -She startedZometaon 11/29/20, but given her CKD this has been held. I will review the option of Prolia when her CKD improves. -She is not having significant back pain or tendernessfromher lumbar fracture, Dr. Thurnell Garbe not recommend osteocoolorkyphoplasty at this point. -On chemo her back pain much improved and ribcage pain resolved. She currently takes Tylenol as needed. She still has Norco  3. Anorexia, weight loss,nausea,weakness/fatigue -I recommend to ambulate with a cane at home due to her back pain and generalized weakness.  -She did not get much help from Mirtazapine and Marinol made her eye blurred. Will reduce Ibrance starting with C3, so her appetite can improve.  -Given Ensure is leading to diarrhea, I recommend Ensure clear or trying another protein shake. I encouraged her to increase protein and calorie in diet.  -For nausea she can continue Zofran and compazine.  4.Chroniccomorbidities:Pre-DM, CKDstage III, history of CVA, HL, osteopenia -On amlodipine, clopidogrel, hydralazine, aspirin, furosemide, lisinopril, -She has had osteoporosis as seen on 2016 and 08/2017 DEXA. Her 08/2020 DEXA improved to osteopenia.Due to hypercalcemia,willhold calcium for now, she can continue vitamin D -Continue to F/u withPCP Dr. Billey Gosling  5. Anemia, Pancytopenia  -Inially mild anemia  in 08/2020, now moderate in the past 2 weeks. This is likely related to bone mets. -Continue oral iron. -She has pancytopenia from [facility] today with WBC 1.6, Hg 9.1, plt 132K, ANC 0.7 (03/06/21). Will reduce dose from C3.Ibrance    PLAN: -Continue Anastrozole daily  -Continue Ibrance47m and reduce to 2 weeks on/1 week off starting with C3 on 03/13/21. -f/u on 5/6 with lab and PET a few days before    No problem-specific Assessment & Plan notes found for this encounter.   Orders Placed This Encounter  Procedures  . NM PET Image Restag (PS) Skull Base To Thigh    Standing Status:   Future    Standing Expiration Date:   03/06/2022    Order Specific Question:   If indicated for the ordered procedure, I authorize the administration of a radiopharmaceutical per Radiology protocol    Answer:   Yes    Order Specific Question:   Pref[facility]?    [facility]  All questions were answered. The patient knows to call the clinic with any problems, questions or concerns. No barriers to learning was detected. The total time spent in the appointment was 30 minutes.     YTruitt Merle MD 03/06/2021   I, AJoslyn Devon am acting as scribe for YTruitt Merle MD.   I have reviewed the above documentation for accuracy and completeness, and I agree with the above.

## 2021-03-03 NOTE — Telephone Encounter (Signed)
Shirley Wells's daughter called requesting refill for Shirley Wells, this rx needs to go to [facility] pharmacy.

## 2021-03-05 ENCOUNTER — Other Ambulatory Visit: Payer: Self-pay | Admitting: Hematology

## 2021-03-05 DIAGNOSIS — C50212 Malignant neoplasm of upper-inner quadrant of left female breast: Secondary | ICD-10-CM

## 2021-03-05 DIAGNOSIS — Z170 Estrogen receptor positive status [ER+]: Secondary | ICD-10-CM

## 2021-03-06 ENCOUNTER — Inpatient Hospital Stay: Payer: Medicare FFS

## 2021-03-06 ENCOUNTER — Inpatient Hospital Stay: Payer: Medicare FFS | Attending: Hematology | Admitting: Hematology

## 2021-03-06 ENCOUNTER — Ambulatory Visit: Payer: Medicare FFS

## 2021-03-06 ENCOUNTER — Other Ambulatory Visit: Payer: Self-pay

## 2021-03-06 ENCOUNTER — Ambulatory Visit: Payer: Medicare FFS | Admitting: Nutrition

## 2021-03-06 VITALS — BP 133/75 | HR 89 | Temp 98.2°F | Resp 18 | Ht 62.0 in | Wt 127.8 lb

## 2021-03-06 DIAGNOSIS — R110 Nausea: Secondary | ICD-10-CM | POA: Insufficient documentation

## 2021-03-06 DIAGNOSIS — Z170 Estrogen receptor positive status [ER+]: Secondary | ICD-10-CM

## 2021-03-06 DIAGNOSIS — Z79811 Long term (current) use of aromatase inhibitors: Secondary | ICD-10-CM | POA: Insufficient documentation

## 2021-03-06 DIAGNOSIS — Z8673 Personal history of transient ischemic attack (TIA), and cerebral infarction without residual deficits: Secondary | ICD-10-CM | POA: Insufficient documentation

## 2021-03-06 DIAGNOSIS — E1122 Type 2 diabetes mellitus with diabetic chronic kidney disease: Secondary | ICD-10-CM | POA: Diagnosis not present

## 2021-03-06 DIAGNOSIS — I129 Hypertensive chronic kidney disease with stage 1 through stage 4 chronic kidney disease, or unspecified chronic kidney disease: Secondary | ICD-10-CM | POA: Insufficient documentation

## 2021-03-06 DIAGNOSIS — C50212 Malignant neoplasm of upper-inner quadrant of left female breast: Secondary | ICD-10-CM | POA: Diagnosis not present

## 2021-03-06 DIAGNOSIS — Z7902 Long term (current) use of antithrombotics/antiplatelets: Secondary | ICD-10-CM | POA: Diagnosis not present

## 2021-03-06 DIAGNOSIS — R630 Anorexia: Secondary | ICD-10-CM | POA: Insufficient documentation

## 2021-03-06 DIAGNOSIS — N189 Chronic kidney disease, unspecified: Secondary | ICD-10-CM | POA: Insufficient documentation

## 2021-03-06 DIAGNOSIS — Z923 Personal history of irradiation: Secondary | ICD-10-CM | POA: Diagnosis not present

## 2021-03-06 DIAGNOSIS — Z79899 Other long term (current) drug therapy: Secondary | ICD-10-CM | POA: Insufficient documentation

## 2021-03-06 DIAGNOSIS — R634 Abnormal weight loss: Secondary | ICD-10-CM | POA: Insufficient documentation

## 2021-03-06 DIAGNOSIS — Z9221 Personal history of antineoplastic chemotherapy: Secondary | ICD-10-CM | POA: Insufficient documentation

## 2021-03-06 DIAGNOSIS — Z9012 Acquired absence of left breast and nipple: Secondary | ICD-10-CM | POA: Diagnosis not present

## 2021-03-06 DIAGNOSIS — E785 Hyperlipidemia, unspecified: Secondary | ICD-10-CM | POA: Diagnosis not present

## 2021-03-06 DIAGNOSIS — R197 Diarrhea, unspecified: Secondary | ICD-10-CM | POA: Insufficient documentation

## 2021-03-06 DIAGNOSIS — C7951 Secondary malignant neoplasm of bone: Secondary | ICD-10-CM | POA: Insufficient documentation

## 2021-03-06 DIAGNOSIS — M47819 Spondylosis without myelopathy or radiculopathy, site unspecified: Secondary | ICD-10-CM | POA: Diagnosis not present

## 2021-03-06 DIAGNOSIS — R5383 Other fatigue: Secondary | ICD-10-CM | POA: Diagnosis not present

## 2021-03-06 DIAGNOSIS — R531 Weakness: Secondary | ICD-10-CM | POA: Insufficient documentation

## 2021-03-06 DIAGNOSIS — Z7982 Long term (current) use of aspirin: Secondary | ICD-10-CM | POA: Diagnosis not present

## 2021-03-06 LAB — COMPREHENSIVE METABOLIC PANEL
ALT: 14 U/L (ref 0–44)
AST: 20 U/L (ref 15–41)
Albumin: 3.5 g/dL (ref 3.5–5.0)
Alkaline Phosphatase: 554 U/L — ABNORMAL HIGH (ref 38–126)
Anion gap: 13 (ref 5–15)
BUN: 22 mg/dL (ref 8–23)
CO2: 16 mmol/L — ABNORMAL LOW (ref 22–32)
Calcium: 8.9 mg/dL (ref 8.9–10.3)
Chloride: 111 mmol/L (ref 98–111)
Creatinine, Ser: 1.58 mg/dL — ABNORMAL HIGH (ref 0.44–1.00)
GFR, Estimated: 33 mL/min — ABNORMAL LOW (ref >60)
Glucose, Bld: 110 mg/dL — ABNORMAL HIGH (ref 70–99)
Potassium: 4.3 mmol/L (ref 3.5–5.1)
Sodium: 140 mmol/L (ref 135–145)
Total Bilirubin: 0.4 mg/dL (ref 0.3–1.2)
Total Protein: 7.2 g/dL (ref 6.5–8.1)

## 2021-03-06 LAB — CBC WITH DIFFERENTIAL/PLATELET
Abs Immature Granulocytes: 0.01 10*3/uL (ref 0.00–0.07)
Basophils Absolute: 0 10*3/uL (ref 0.0–0.1)
Basophils Relative: 2 %
Eosinophils Absolute: 0.1 10*3/uL (ref 0.0–0.5)
Eosinophils Relative: 8 %
HCT: 27.5 % — ABNORMAL LOW (ref 36.0–46.0)
Hemoglobin: 9.1 g/dL — ABNORMAL LOW (ref 12.0–15.0)
Immature Granulocytes: 1 %
Lymphocytes Relative: 39 %
Lymphs Abs: 0.6 10*3/uL — ABNORMAL LOW (ref 0.7–4.0)
MCH: 37.3 pg — ABNORMAL HIGH (ref 26.0–34.0)
MCHC: 33.1 g/dL (ref 30.0–36.0)
MCV: 112.7 fL — ABNORMAL HIGH (ref 80.0–100.0)
Monocytes Absolute: 0.1 10*3/uL (ref 0.1–1.0)
Monocytes Relative: 5 %
Neutro Abs: 0.7 10*3/uL — ABNORMAL LOW (ref 1.7–7.7)
Neutrophils Relative %: 45 %
Platelets: 132 10*3/uL — ABNORMAL LOW (ref 150–400)
RBC: 2.44 MIL/uL — ABNORMAL LOW (ref 3.87–5.11)
RDW: 19.1 % — ABNORMAL HIGH (ref 11.5–15.5)
WBC: 1.6 10*3/uL — ABNORMAL LOW (ref 4.0–10.5)
nRBC: 0 % (ref 0.0–0.2)

## 2021-03-06 MED ORDER — ANASTROZOLE 1 MG PO TABS: 1.0000 mg | ORAL_TABLET | Freq: Every day | ORAL | 1 refills | Status: DC

## 2021-03-06 MED ORDER — PALBOCICLIB 75 MG PO TABS: 75.0000 mg | ORAL_TABLET | Freq: Every day | ORAL | 2 refills | Status: DC

## 2021-03-06 NOTE — Progress Notes (Signed)
I attempted nutrition follow-up during infusion for metastatic left breast cancer.  Patient did not receive treatment today so she was not available for nutrition follow-up.  I tried to catch patient in lobby but she did not answer to name called.  Weight is essentially stable over the past month.  It was documented as 127.8 pounds April 11 stable from 128.9 pounds March 14.  Labs were reviewed and noted glucose 110 and creatinine 1.58.  Patient was advised by MD to try clear liquid nutrition supplements.  I will try to reschedule patient during next infusion.  RD contact information was mailed to patient at previous encounter.  **Disclaimer: This note was dictated with voice recognition software. Similar sounding words can inadvertently be transcribed and this note may contain transcription errors which may not have been corrected upon publication of note.**

## 2021-03-06 NOTE — Progress Notes (Signed)
Per Dr. Burr Medico, no zometa today due to patient's GFR. Patient made aware and given copy of labwork from today. Patient verbalized understanding. Advised patient to call office with any questions or concerns.

## 2021-03-07 LAB — CANCER ANTIGEN 27.29: CA 27.29: 107.8 U/mL — ABNORMAL HIGH (ref 0.0–38.6)

## 2021-03-09 ENCOUNTER — Encounter: Payer: Self-pay | Admitting: Hematology

## 2021-03-12 DIAGNOSIS — C50919 Malignant neoplasm of unspecified site of unspecified female breast: Secondary | ICD-10-CM | POA: Insufficient documentation

## 2021-03-12 NOTE — Patient Instructions (Addendum)
   Medications changes include :   none    Please followup in 6 months   

## 2021-03-12 NOTE — Progress Notes (Signed)
Subjective:    Patient ID: Shirley Wells, female    DOB: 1941/07/01, 80 y.o.   MRN: [id number]  HPI The patient is here for follow up of their chronic medical problems, including metastatic breast cancer, CKD, htn, prediabetes, leg edema, CVA dyslipidemia, osteopentia   She is on anastrazole and ibrance for her metastatic cancer. ibrance was decreased two months ago for cytopenia and fatigue.   She does state decreased appetite-mostly because the Ibrance causes metallic taste.  Most things do not taste very good.  She forces herself to eat.  She has tried several different things and is trying to eat healthy.  When she was taking CBD Gummies that seemed to help her appetite and she was eating better.  At one point she did have some blurry vision was concerned the Gummies were the cause and she stopped taking them.    She is sleeping pretty well and getting around well.  She does have lower back pain when she is up a lot.   She has no concerns.   Medications and allergies reviewed with patient and updated if appropriate.  Patient Active Problem List   Diagnosis Date Noted  . Metastatic breast cancer ([place]) 03/12/2021  . Hypercalcemia 11/07/2020  . Elevated alkaline phosphatase level 11/07/2020  . Compression fracture of L2 ([place]) 09/14/2020  . Chronic bilateral low back pain 09/12/2020  . Hyperglycemia 02/03/2019  . Malignant neoplasm of upper-inner quadrant of left breast in female, estrogen receptor positive ([place]) 06/20/2017  . CKD (chronic kidney disease) stage 4, GFR 15-29 ml/min ([facility]) 11/02/2016  . Osteopenia, high frax 04/30/2014  . Edema 09/26/2010  . PEPTIC ULCER DISEASE 03/21/2010  . ARTHRITIS 03/21/2010  . Dyslipidemia 03/20/2010  . Essential hypertension 03/20/2010  . History of CVA (cerebrovascular accident) 02/18/2010    Current Outpatient Medications on File Prior to Visit  Medication Sig Dispense Refill  . acetaminophen (TYLENOL) 650 MG CR tablet Take 1,300 mg  by mouth every 8 (eight) hours as needed for pain.    Marland Kitchen amLODipine (NORVASC) 5 MG tablet Take 1 tablet (5 mg total) by mouth daily. 90 tablet 3  . anastrozole (ARIMIDEX) 1 MG tablet Take 1 tablet (1 mg total) by mouth daily. 90 tablet 1  . aspirin 81 MG tablet Take 81 mg by mouth 2 (two) times a week.    . clopidogrel (PLAVIX) 75 MG tablet TAKE 1 TABLET(75 MG) BY MOUTH DAILY (Patient taking differently: Take 75 mg by mouth daily.) 90 tablet 1  . furosemide (LASIX) 20 MG tablet Take 20 mg by mouth daily as needed for fluid.    . hydrALAZINE (APRESOLINE) 25 MG tablet Take 1 tablet (25 mg total) by mouth in the morning and at bedtime. 60 tablet 5  . HYDROcodone-acetaminophen (NORCO/VICODIN) 5-325 MG tablet Take 1-2 tablets by mouth every 4 (four) hours as needed for severe pain. 60 tablet 0  . lisinopril (ZESTRIL) 10 MG tablet TAKE 1 TABLET(10 MG) BY MOUTH DAILY 90 tablet 0  . loratadine (CLARITIN) 10 MG tablet Take 10 mg by mouth daily as needed for allergies.    . Menthol, Topical Analgesic, (BIOFREEZE EX) Apply 1 application topically daily as needed (muscle pain).    . Multiple Vitamin (MULTIVITAMIN) capsule Take 1 capsule by mouth daily.    . ondansetron (ZOFRAN) 8 MG tablet Take 1 tablet (8 mg total) by mouth every 8 (eight) hours as needed for nausea or vomiting. 30 tablet 3  . palbociclib (IBRANCE) 75 MG  tablet Take 1 tablet (75 mg total) by mouth daily. Take for 14 days on, 7 days off, repeat every 21 days. 14 tablet 2  . prochlorperazine (COMPAZINE) 10 MG tablet Take 1 tablet (10 mg total) by mouth every 6 (six) hours as needed for nausea or vomiting. 30 tablet 1   No current facility-administered medications on file prior to visit.    Past Medical History:  Diagnosis Date  . CHICKENPOX, HX OF 03/21/2010   Qualifier: Diagnosis of  By: Marca Ancona RMA, Lucy    . 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  Dyslipidemia    intol of statin due to >3x increase LFTs  . GERD (gastroesophageal reflux disease)    hx  . Hypertension, essential   . Malignant neoplasm of upper-inner quadrant of left breast in female, estrogen receptor positive ([place]) 06/20/2017  . Osteoarthritis   . Stroke [facility])     Past Surgical History:  Procedure Laterality Date  . CHOLECYSTECTOMY  1981  . MASTECTOMY W/ SENTINEL NODE BIOPSY Left 07/30/2017   Procedure: LEFT TOTAL MASTECTOMY WITH AXILLARY SENTINEL LYMPH NODE BIOPSY;  Surgeon: Rolm Bookbinder, MD;  Location: [facility];  Service: General;  Laterality: Left;    Social History   Socioeconomic History  . Marital status: Divorced    Spouse name: Not on file  . Number of children: Not on file  . Years of education: Not on file  . Highest education level: Not on file  Occupational History  . Not on file  Tobacco Use  . Smoking status: Never Smoker  . Smokeless tobacco: Never Used  Vaping Use  . Vaping Use: Never used  Substance and Sexual Activity  . Alcohol use: No    Alcohol/week: 0.0 standard drinks  . Drug use: No  . Sexual activity: Not on file  Other Topics Concern  . Not on file  Social History Narrative   Divorced  >40 yrs, single and lives alone.    Supportive daughters, 1 is a Therapist, sports at [employer]).    Patient enjoys gardening, dancing and caring for her grand children.   Social Determinants of Health   Financial Resource Strain: Not on file  Food Insecurity: Not on file  Transportation Needs: No Transportation Needs  . Lack of Transportation (Medical): No  . Lack of Transportation (Non-Medical): No  Physical Activity: Not on file  Stress: Not on file  Social Connections: Not on file    Family History  Problem Relation Age of Onset  . Breast cancer Mother 4  . Arthritis Other   . Breast cancer Maternal Aunt     Review of Systems  Constitutional: Positive for appetite change. Negative for fever.  Respiratory: Negative for  cough, shortness of breath and wheezing.   Cardiovascular: Positive for leg swelling (left leg occ). Negative for chest pain and palpitations.  Gastrointestinal: Negative for abdominal pain.  Neurological: Negative for dizziness, light-headedness and headaches.       Objective:   Vitals:   03/13/21 1033  BP: 118/70  Pulse: 83  Temp: 98.6 F (37 C)  SpO2: 99%   BP Readings from Last 3 Encounters:  03/13/21 118/70  03/13/21 118/70  03/06/21 133/75   Wt Readings from Last 3 Encounters:  03/13/21 128 lb 8.5 oz (58.3 kg)  03/13/21 128 lb 9.6 oz (58.3 kg)  03/06/21 127 lb 12.8 oz (58 kg)   Body  mass index is 23.52 kg/m.   Physical Exam    Constitutional: Appears well-developed and well-nourished. No distress.  HENT:  Head: Normocephalic and atraumatic.  Neck: Neck supple. No tracheal deviation present. No thyromegaly present.  No cervical lymphadenopathy Cardiovascular: Normal rate, regular rhythm and normal heart sounds.   No murmur heard. No carotid bruit .  Trace left lower extremity edema Pulmonary/Chest: Effort normal and breath sounds normal. No respiratory distress. No has no wheezes. No rales.  Skin: Skin is warm and dry. Not diaphoretic.  Psychiatric: Normal mood and affect. Behavior is normal.      Assessment & Plan:    See Problem List for Assessment and Plan of chronic medical problems.    This visit occurred during the SARS-CoV-2 public health emergency.  Safety protocols were in place, including screening questions prior to the visit, additional usage of staff PPE, and extensive cleaning of exam room while observing appropriate contact time as indicated for disinfecting solutions.

## 2021-03-13 ENCOUNTER — Ambulatory Visit: Payer: Medicare FFS

## 2021-03-13 ENCOUNTER — Other Ambulatory Visit: Payer: Self-pay

## 2021-03-13 ENCOUNTER — Encounter: Payer: Self-pay | Admitting: Internal Medicine

## 2021-03-13 ENCOUNTER — Ambulatory Visit: Payer: Medicare FFS | Admitting: Internal Medicine

## 2021-03-13 VITALS — BP 118/70 | HR 83 | Temp 98.6°F | Ht 62.0 in | Wt 128.6 lb

## 2021-03-13 DIAGNOSIS — I10 Essential (primary) hypertension: Secondary | ICD-10-CM

## 2021-03-13 DIAGNOSIS — Z8673 Personal history of transient ischemic attack (TIA), and cerebral infarction without residual deficits: Secondary | ICD-10-CM

## 2021-03-13 DIAGNOSIS — C50919 Malignant neoplasm of unspecified site of unspecified female breast: Secondary | ICD-10-CM

## 2021-03-13 DIAGNOSIS — N1832 Chronic kidney disease, stage 3b: Secondary | ICD-10-CM | POA: Diagnosis not present

## 2021-03-13 DIAGNOSIS — N184 Chronic kidney disease, stage 4 (severe): Secondary | ICD-10-CM

## 2021-03-13 NOTE — Assessment & Plan Note (Signed)
Chronic Has seen nephrology Kidney function has remained stable-currently stage IIIb

## 2021-03-13 NOTE — Assessment & Plan Note (Signed)
Chronic BP well controlled Continue amlodipine 5 mg daily, lisinopril 10 mg daily, hydralazine 25 mg twice daily

## 2021-03-13 NOTE — Assessment & Plan Note (Signed)
Chronic Following with oncology On anastrozole 1 mg daily Currently taking Ibrance Experiencing decreased appetite, metallic taste-CBD Gummies has helped encouraged her to continue those

## 2021-03-13 NOTE — Assessment & Plan Note (Signed)
Chronic No new deficits Continue Plavix 75 mg daily, aspirin 81 mg daily BP well controlled

## 2021-03-13 NOTE — Progress Notes (Signed)
This encounter was created in error - please disregard.

## 2021-03-16 ENCOUNTER — Telehealth: Payer: Self-pay

## 2021-03-16 NOTE — Telephone Encounter (Signed)
Request faxed today

## 2021-03-16 NOTE — Telephone Encounter (Signed)
-----   Message from Binnie Rail, MD sent at 03/13/2021 12:24 PM EDT ----- Can you please request her notes from [facility].  I know she is seen them once or twice a day never received their office notes.  Thank you.

## 2021-03-18 ENCOUNTER — Other Ambulatory Visit: Payer: Self-pay | Admitting: Internal Medicine

## 2021-03-27 NOTE — Progress Notes (Signed)
[place]   [phone] [phone]   Clinic Follow up Note   Patient Care Team: Binnie Rail, MD as PCP - General (Internal Medicine) Garvin Fila, MD as Consulting Physician (Neurology) Rolm Bookbinder, MD as Consulting Physician (General Surgery) Truitt Merle, MD as Consulting Physician (Hematology) Kyung Rudd, MD as Consulting Physician (Radiation Oncology)  Date of Service:  03/31/2021  CHIEF COMPLAINT: F/u of metastatic breast cancer  SUMMARY OF ONCOLOGIC HISTORY: Oncology History Overview Note  Cancer Staging Malignant neoplasm of upper-inner quadrant of left breast in female, estrogen receptor positive ([place]) Staging form: Breast, AJCC 8th Edition - Clinical stage from 06/18/2017: Stage IB (cT2, cN0, cM0, G2, ER: Positive, PR: Positive, HER2: Negative) - Signed by Truitt Merle, MD on 06/24/2017 - Pathologic stage from 07/30/2017: Stage IA (pT2, pN0, cM0, G2, ER: Positive, PR: Positive, HER2: Negative, Oncotype DX score: 23) - Signed by Truitt Merle, MD on 08/23/2017     Malignant neoplasm of upper-inner quadrant of left breast in female, estrogen receptor positive ([place])  06/17/2017 Mammogram   [place] and MM diagnostic Breast Tomo Bilateral  IMPRESSION: 1. 3.4 palpable mass in the 10 o'clock position of the left breast is highly suspicious for malignancy. 2. No evidence of malignancy in the right breast. 3. Ultrasound of the left axilla shows normal sized axillary lymph nodes.   06/18/2017 Initial Biopsy   Diagnosis 06/18/17 Breast, left, needle core biopsy, 10:00 o'clock - INVASIVE MAMMARY CARCINOMA. At least G2  The malignant cells are negative for E-Cadherin, supporting a lobular phenotype.   06/18/2017 Initial Diagnosis   Malignant neoplasm of upper-inner quadrant of left breast in female, estrogen receptor positive ([place])   06/18/2017 Receptors her2   ER 95%+, PR 25%+, both strong staining  HER2- Ki67 30%    07/30/2017 Surgery   LEFT  TOTAL MASTECTOMY WITH AXILLARY SENTINEL LYMPH NODE BIOPSY by Dr. Donne Hazel on 07/30/17   07/30/2017 Pathology Results   Diagnosis 07/30/17 1. Breast, simple mastectomy, Left Total - INVASIVE LOBULAR CARCINOMA, GRADE 2, SPANNING 3.5 CM. - LOBULAR CARCINOMA IN SITU. - INVASIVE CARCINOMA IS BROADLY 0.1 CM FROM POSTERIOR MARGIN. - PERINEURAL INVASION PRESENT. - SEE ONCOLOGY TABLE. 2. Lymph node, sentinel, biopsy, Left Axillary - ONE OF ONE LYMPH NODES NEGATIVE FOR CARCINOMA (0/1). 3. Lymph node, sentinel, biopsy, Left - ONE OF ONE LYMPH NODES NEGATIVE FOR CARCINOMA (0/1).   07/30/2017 Oncotype testing   Oncotype 07/30/17 Recurrence score is 23, an intermediate risk With a 10-year risk of recurrence at 15% with tamoxifen alone.     11/11/2020 Imaging   MRI Lumbar Spine  IMPRESSION: Diffuse osseous metastases. Chronic inferior L2 endplate deformity with mild height loss.   Likely late subacute pathologic fracture deformities involving the superior L1 endplate and right L3 lamina.   Multilevel spondylosis. Moderate to severe spinal canal and neural foraminal narrowing at the L3-4 and L4-5 levels.   Mild to moderate spinal canal and neural foraminal narrowing at the L5-S1 level.   11/28/2020 PET scan   IMPRESSION: 1. Widespread and diffuse hypermetabolic bony metastases. 2. No evidence for soft tissue metastases in the neck, chest, abdomen, or pelvis. 3.  Aortic Atherosclerois (ICD10-170.0)     11/29/2020 -  Chemotherapy   Zometa q34month starting 11/29/20. Held after first dose. Given her CKD will only give when she has hypercalcemia and may switch to Prolia.     12/04/2020 Imaging   MRI Brain  IMPRESSION: No evidence of intracranial metastatic disease.   Skull  base and upper cervical osseous metastatic disease.   Mild chronic microvascular ischemic changes. Small chronic right cerebellar infarct.     12/07/2020 - 12/21/2020 Radiation Therapy   palliative RT to left hip with Dr Lisbeth Renshaw  on 12/07/20-12/21/20   12/09/2020 Pathology Results   FINAL MICROSCOPIC DIAGNOSIS:   A. BONE, BIOPSY:  - Metastatic carcinoma.  - See comment.   COMMENT:  The morphology is compatible with metastatic lobular breast carcinoma.  Estrogen receptor, progesterone receptor and HER2/neu will be performed.   ADDENDUM:   PROGNOSTIC INDICATOR RESULTS:   Immunohistochemical and morphometric analysis performed manually   The tumor cells are EQUIVOCAL for Her2 (2+).  Her2 by FISH will be performed and the results will be reported  separately.   Estrogen Receptor:       POSITIVE, 50%, MODERATE-STRONG STAINING  Progesterone Receptor:   POSITIVE, 15%, MODERATE-STRONG STAINING    11/2020 -  Anti-estrogen oral therapy   First-line Anti-estrogen therapy Anastrozole 59m daily starting in 11/2020 and Ibrance starting in 12/2020    01/09/2021 -  Chemotherapy   Ibrance 3 weeks on/1 weeks off starting beginning of 01/09/21. Reduced to 715mstarting with C2 (02/13/21) due to cytopenia and fatigue. Reduced to 7521m weeks on/1 week off starting C3 on 03/13/21)      CURRENT THERAPY:  Zometa q3mo31montharting 11/29/20. Given her CKD will only give when she has hypercalcemia and may switch to Prolia.  First-line Anti-estrogen therapy Anastrozole 1mg 36mly starting end of 11/2020+ Ibrance 3 weeks on/1 weeks off starting beginning of 01/09/21. Reduced to 75mg 56mting with C2 (02/13/21) due to cytopenia and fatigue. Reduced to 75mg 296mks on/1 week off starting C3 on 03/13/21.  INTERVAL HISTORY:  Shirley M LATRENA BENEGASe for a follow up of metastatic breast cancer. She was lastly seen by me 03/06/21. She presents to the clinic with her daughter. She notes she is doing well. She notes she has been doing better overall lately. She is able to do gardening. She notes her lower back may hurt if standing for some time. She notes she has not needed pain medication recently. She notes she is eating better on lower dose Ibrance. She  notes she is active at home and outside and does not feel she needs PT for additional help.  I reviewed her medication list with her. Her daughter notes concern for her being SOB after walking for some time. She notes her BM are harder but able to have good BM with stool softener as needed.  She notes she recently had shingles infection and interested in vaccine.   REVIEW OF SYSTEMS:   Constitutional: Denies fevers, chills or abnormal weight loss Eyes: Denies blurriness of vision Ears, nose, mouth, throat, and face: Denies mucositis or sore throat Respiratory: Denies cough, dyspnea or wheezes Cardiovascular: Denies palpitation, chest discomfort or lower extremity swelling Gastrointestinal:  Denies nausea, heartburn or change in bowel habits Skin: Denies abnormal skin rashes Lymphatics: Denies new lymphadenopathy or easy bruising Neurological:Denies numbness, tingling or new weaknesses Behavioral/Psych: Mood is stable, no new changes  All other systems were reviewed with the patient and are negative.  MEDICAL HISTORY:  Past Medical History:  Diagnosis Date  . CHICKENPOX, HX OF 03/21/2010   Qualifier: Diagnosis of  By: Brand RMarca [place]    . 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 Dyslipidemia    intol of statin due to >  3x increase LFTs  . GERD (gastroesophageal reflux disease)    hx  . Hypertension, essential   . Malignant neoplasm of upper-inner quadrant of left breast in female, estrogen receptor positive ([place]) 06/20/2017  . Osteoarthritis   . Stroke [facility])     SURGICAL HISTORY: Past Surgical History:  Procedure Laterality Date  . CHOLECYSTECTOMY  1981  . MASTECTOMY W/ SENTINEL NODE BIOPSY Left 07/30/2017   Procedure: LEFT TOTAL MASTECTOMY WITH AXILLARY SENTINEL LYMPH NODE BIOPSY;  Surgeon: Rolm Bookbinder, MD;  Location: [place];  Service: General;  Laterality: Left;    I have reviewed the  social history and family history with the patient and they are unchanged from previous note.  ALLERGIES:  is allergic to influenza vaccines, pneumococcal vaccines, simvastatin, tetanus toxoids, and zoster vaccine live.  MEDICATIONS:  Current Outpatient Medications  Medication Sig Dispense Refill  . acetaminophen (TYLENOL) 650 MG CR tablet Take 1,300 mg by mouth every 8 (eight) hours as needed for pain.    Marland Kitchen amLODipine (NORVASC) 5 MG tablet Take 1 tablet (5 mg total) by mouth daily. 90 tablet 3  . anastrozole (ARIMIDEX) 1 MG tablet Take 1 tablet (1 mg total) by mouth daily. 90 tablet 1  . aspirin 81 MG tablet Take 81 mg by mouth 2 (two) times a week.    . clopidogrel (PLAVIX) 75 MG tablet TAKE 1 TABLET(75 MG) BY MOUTH DAILY (Patient taking differently: Take 75 mg by mouth daily.) 90 tablet 1  . furosemide (LASIX) 20 MG tablet Take 20 mg by mouth daily as needed for fluid.    . hydrALAZINE (APRESOLINE) 25 MG tablet TAKE 1 TABLET(25 MG) BY MOUTH IN THE MORNING AND AT BEDTIME 60 tablet 5  . HYDROcodone-acetaminophen (NORCO/VICODIN) 5-325 MG tablet Take 1-2 tablets by mouth every 4 (four) hours as needed for severe pain. 60 tablet 0  . lisinopril (ZESTRIL) 10 MG tablet TAKE 1 TABLET(10 MG) BY MOUTH DAILY 90 tablet 0  . loratadine (CLARITIN) 10 MG tablet Take 10 mg by mouth daily as needed for allergies.    . Menthol, Topical Analgesic, (BIOFREEZE EX) Apply 1 application topically daily as needed (muscle pain).    . Multiple Vitamin (MULTIVITAMIN) capsule Take 1 capsule by mouth daily.    . ondansetron (ZOFRAN) 8 MG tablet Take 1 tablet (8 mg total) by mouth every 8 (eight) hours as needed for nausea or vomiting. 30 tablet 3  . palbociclib (IBRANCE) 75 MG tablet Take 1 tablet (75 mg total) by mouth daily. Take for 14 days on, 7 days off, repeat every 21 days. 14 tablet 1  . prochlorperazine (COMPAZINE) 10 MG tablet Take 1 tablet (10 mg total) by mouth every 6 (six) hours as needed for nausea or  vomiting. 30 tablet 1   No current facility-administered medications for this visit.    PHYSICAL EXAMINATION: ECOG PERFORMANCE STATUS: 2 - Symptomatic, <50% confined to bed  Vitals:   03/31/21 1027  BP: (!) 151/60  Pulse: 60  Resp: 18  Temp: 98 F (36.7 C)  SpO2: 100%   Filed Weights   03/31/21 1027  Weight: 128 lb 11.2 oz (58.4 kg)    GENERAL:alert, no distress and comfortable SKIN: skin color, texture, turgor are normal, no rashes or significant lesions EYES: normal, Conjunctiva are pink and non-injected, sclera clear  NECK: supple, thyroid normal size, non-tender, without nodularity LYMPH:  no palpable lymphadenopathy in the cervical, axillary  LUNGS: clear to auscultation and percussion with normal breathing effort HEART: regular rate &  rhythm and no murmurs (+) Left lower extremity edema ABDOMEN:abdomen soft, non-tender and normal bowel sounds Musculoskeletal:no cyanosis of digits and no clubbing  NEURO: alert & oriented x 3 with fluent speech, no focal motor/sensory deficits  LABORATORY DATA:  I have reviewed the data as listed CBC Latest Ref Rng & Units 03/31/2021 03/06/2021 02/23/2021  WBC 4.0 - 10.5 K/uL 2.3(L) 1.6(L) 2.4(L)  Hemoglobin 12.0 - 15.0 g/dL 9.2(L) 9.1(L) 9.3(L)  Hematocrit 36.0 - 46.0 % 28.6(L) 27.5(L) 28.5(L)  Platelets 150 - 400 K/uL 216 132(L) 212     CMP Latest Ref Rng & Units 03/31/2021 03/06/2021 02/23/2021  Glucose 70 - 99 mg/dL 100(H) 110(H) 98  BUN 8 - 23 mg/dL 26(H) 22 24(H)  Creatinine 0.44 - 1.00 mg/dL 1.42(H) 1.58(H) 1.72(H)  Sodium 135 - 145 mmol/L 146(H) 140 143  Potassium 3.5 - 5.1 mmol/L 4.6 4.3 5.2(H)  Chloride 98 - 111 mmol/L 112(H) 111 109  CO2 22 - 32 mmol/L 23 16(L) 22  Calcium 8.9 - 10.3 mg/dL 8.6(L) 8.9 8.7(L)  Total Protein 6.5 - 8.1 g/dL 7.0 7.2 7.0  Total Bilirubin 0.3 - 1.2 mg/dL 0.4 0.4 0.4  Alkaline Phos 38 - 126 U/L 381(H) 554(H) 587(H)  AST 15 - 41 U/L _0 ALT 0 - 44 U/L _1 RADIOGRAPHIC  STUDIES: I have personally reviewed the radiological images as listed and agreed with the findings in the report. No results found.   ASSESSMENT & PLAN:  FINNLEE GUARNIERI is a 80 y.o. female with   1. 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 She was treated with left total mastectomy. She did not require radiation and declinedantiestrogen therapydue toconcerns ofside effects. She lost follow up after 07/2017. -After 6 months of left hip and ribcage pain she was found to have diffuse bone metastasis on 11/28/20 PET. This was confirmed on 12/09/20 bone biopsy which showedmetastatic carcinoma compatible from prior breast cancer, ER50%+,PR15%positive and HER2negative.  -Given her metastatic breast cancer, her cancer is no longer curable but still treatable. Istarted her on first-lineAnastrozolein1/2022 andIbrance110m3 weeks on/1 week off starting 01/09/21.Reduced to 722mstarting with C2 and reduced to 2 weeks on/1 week off starting with C3 on 03/13/21.  -She is tolerating this reduced dose well. Weight is stable, pain much improved and her energy is better. Physical exam unremarkable. Labs reviewed, WBC 2.3, HG 9.2. Overall adequate to continue Ibrance.  -She will proceed with PET scan at 1pm today to evaluate her response to treatment. I will call her with results next week. -Continue Anastrozole once daily  -ContinueIbrance7542mnd reduce to 2 weeks on/1 week off. Start next cycle on 5/16 -F/u in 6 weeks   2.Diffuse bone metastasis,L1/L3 fracture,left hip and rib cage pain,hypercalcemia -Shepresented with significant left hip and rib cage pain for 6 months,lumbar MRI showed diffuse bone metastasisand subacute pathologic fracture of L1 and L3. -Her 1/3/22PET scan showed diffuse bone  metastasis in pelvis,femur, spine, and ribs. No visceralmetastasis.Her bone biopsy from 12/09/20 showed metastatic carcinoma compatible from prior breast cancer. -She completed palliative RT with Dr MooLisbeth Renshaw 12/07/20-12/21/20 for severe left hip pain. Radiationhas improved her pain. -She startedZometaon 11/29/20, but given her CKDthis has been held. I will review the option of Prolia when her CKD improves. -She is not having significant back pain or  tendernessfromher lumbar fracture, Dr. Thurnell Garbe not recommend osteocoolorkyphoplasty at this point. -On chemo her back pain much improved and ribcage pain resolved. She currently takes Tylenol as needed. She still has Norco  3. Anorexia, weight loss,nausea,weakness/fatigue -I recommend to ambulate with a cane at home due to her back pain and generalized weakness.  -She did not get much help from Mirtazapine and Marinol made her eye blurred. -Given Ensure is leading to diarrhea, I recommend Ensure clear or trying another protein shake. I encouraged her to increase protein and calorie in diet.  -Although her appetite remains low, her weight is stable.   4.Chroniccomorbidities:Pre-DM, CKDstage III, history of CVA, HL, osteopenia -On amlodipine, clopidogrel, hydralazine, aspirin, furosemide, lisinopril, -She has had osteoporosis as seen on 2016 and 08/2017 DEXA. Her 08/2020 DEXA improved to osteopenia.Due to hypercalcemia,willhold calcium for now, she can continue vitamin D -Continue to F/u withPCP Dr. Billey Gosling  5. Anemia, Pancytopenia -Inially mild anemia in 08/2020, now moderate in the past 2 weeks. This is likely related to bone mets. -Continue oral iron. -She has pancytopenia from [place]. Improving with Ibrance dose reduction.   6. Left LE edema  -Per pt her left ankle has been swelling for some time, but her swelling does improved with feet elevation.  -I discussed there is possibility for blood clot with unilateral  swelling. I suggested Doppler for further evaluation. Her daughter encouraged her to have doppler, but pt would like to hold on this.  -Will monitor.   PLAN: -Continue Anastrozole daily  -ContinueIbrance51m and reduce to 2 weeks on/1 week off. She is finishing current cycle and will start next cycle on 5/16 -Proceed with PET today, I will call her with results next week  -Lab and f/u in 6 weeks    No problem-specific Assessment & Plan notes found for this encounter.   No orders of the defined types were placed in this encounter.  All questions were answered. The patient knows to call the clinic with any problems, questions or concerns. No barriers to learning was detected. The total time spent in the appointment was 30 minutes.     YTruitt Merle MD 03/31/2021   I, AJoslyn Devon am acting as scribe for YTruitt Merle MD.   I have reviewed the above documentation for accuracy and completeness, and I agree with the above.

## 2021-03-31 ENCOUNTER — Encounter: Payer: Self-pay | Admitting: Hematology

## 2021-03-31 ENCOUNTER — Ambulatory Visit (HOSPITAL_COMMUNITY)
Admission: RE | Admit: 2021-03-31 | Discharge: 2021-03-31 | Disposition: A | Discharge status: Home / Self Care | Payer: Medicare FFS | Source: Ambulatory Visit | Attending: Hematology | Admitting: Hematology

## 2021-03-31 ENCOUNTER — Inpatient Hospital Stay: Payer: Medicare FFS | Admitting: Hematology

## 2021-03-31 ENCOUNTER — Other Ambulatory Visit: Payer: Self-pay

## 2021-03-31 ENCOUNTER — Inpatient Hospital Stay: Payer: Medicare FFS | Attending: Hematology

## 2021-03-31 VITALS — BP 151/60 | HR 60 | Temp 98.0°F | Resp 18 | Ht 62.0 in | Wt 128.7 lb

## 2021-03-31 DIAGNOSIS — Z7902 Long term (current) use of antithrombotics/antiplatelets: Secondary | ICD-10-CM | POA: Insufficient documentation

## 2021-03-31 DIAGNOSIS — Z9012 Acquired absence of left breast and nipple: Secondary | ICD-10-CM | POA: Insufficient documentation

## 2021-03-31 DIAGNOSIS — C7951 Secondary malignant neoplasm of bone: Secondary | ICD-10-CM | POA: Insufficient documentation

## 2021-03-31 DIAGNOSIS — N189 Chronic kidney disease, unspecified: Secondary | ICD-10-CM | POA: Diagnosis not present

## 2021-03-31 DIAGNOSIS — Z79899 Other long term (current) drug therapy: Secondary | ICD-10-CM | POA: Insufficient documentation

## 2021-03-31 DIAGNOSIS — E1122 Type 2 diabetes mellitus with diabetic chronic kidney disease: Secondary | ICD-10-CM | POA: Diagnosis not present

## 2021-03-31 DIAGNOSIS — Z9221 Personal history of antineoplastic chemotherapy: Secondary | ICD-10-CM | POA: Diagnosis not present

## 2021-03-31 DIAGNOSIS — R531 Weakness: Secondary | ICD-10-CM | POA: Insufficient documentation

## 2021-03-31 DIAGNOSIS — Z79811 Long term (current) use of aromatase inhibitors: Secondary | ICD-10-CM | POA: Diagnosis not present

## 2021-03-31 DIAGNOSIS — Z170 Estrogen receptor positive status [ER+]: Secondary | ICD-10-CM | POA: Insufficient documentation

## 2021-03-31 DIAGNOSIS — Z8673 Personal history of transient ischemic attack (TIA), and cerebral infarction without residual deficits: Secondary | ICD-10-CM | POA: Diagnosis not present

## 2021-03-31 DIAGNOSIS — R600 Localized edema: Secondary | ICD-10-CM | POA: Insufficient documentation

## 2021-03-31 DIAGNOSIS — C50212 Malignant neoplasm of upper-inner quadrant of left female breast: Secondary | ICD-10-CM

## 2021-03-31 DIAGNOSIS — R634 Abnormal weight loss: Secondary | ICD-10-CM | POA: Diagnosis not present

## 2021-03-31 DIAGNOSIS — R110 Nausea: Secondary | ICD-10-CM | POA: Insufficient documentation

## 2021-03-31 DIAGNOSIS — D61818 Other pancytopenia: Secondary | ICD-10-CM | POA: Insufficient documentation

## 2021-03-31 DIAGNOSIS — R630 Anorexia: Secondary | ICD-10-CM | POA: Diagnosis not present

## 2021-03-31 DIAGNOSIS — Z923 Personal history of irradiation: Secondary | ICD-10-CM | POA: Insufficient documentation

## 2021-03-31 DIAGNOSIS — R5383 Other fatigue: Secondary | ICD-10-CM | POA: Insufficient documentation

## 2021-03-31 LAB — CBC WITH DIFFERENTIAL/PLATELET
Abs Immature Granulocytes: 0.02 10*3/uL (ref 0.00–0.07)
Basophils Absolute: 0 10*3/uL (ref 0.0–0.1)
Basophils Relative: 2 %
Eosinophils Absolute: 0.3 10*3/uL (ref 0.0–0.5)
Eosinophils Relative: 12 %
HCT: 28.6 % — ABNORMAL LOW (ref 36.0–46.0)
Hemoglobin: 9.2 g/dL — ABNORMAL LOW (ref 12.0–15.0)
Immature Granulocytes: 1 %
Lymphocytes Relative: 40 %
Lymphs Abs: 0.9 10*3/uL (ref 0.7–4.0)
MCH: 37.9 pg — ABNORMAL HIGH (ref 26.0–34.0)
MCHC: 32.2 g/dL (ref 30.0–36.0)
MCV: 117.7 fL — ABNORMAL HIGH (ref 80.0–100.0)
Monocytes Absolute: 0.1 10*3/uL (ref 0.1–1.0)
Monocytes Relative: 6 %
Neutro Abs: 0.9 10*3/uL — ABNORMAL LOW (ref 1.7–7.7)
Neutrophils Relative %: 39 %
Platelets: 216 10*3/uL (ref 150–400)
RBC: 2.43 MIL/uL — ABNORMAL LOW (ref 3.87–5.11)
RDW: 17.1 % — ABNORMAL HIGH (ref 11.5–15.5)
WBC: 2.3 10*3/uL — ABNORMAL LOW (ref 4.0–10.5)
nRBC: 0 % (ref 0.0–0.2)

## 2021-03-31 LAB — COMPREHENSIVE METABOLIC PANEL
ALT: 13 U/L (ref 0–44)
AST: 25 U/L (ref 15–41)
Albumin: 3.4 g/dL — ABNORMAL LOW (ref 3.5–5.0)
Alkaline Phosphatase: 381 U/L — ABNORMAL HIGH (ref 38–126)
Anion gap: 11 (ref 5–15)
BUN: 26 mg/dL — ABNORMAL HIGH (ref 8–23)
CO2: 23 mmol/L (ref 22–32)
Calcium: 8.6 mg/dL — ABNORMAL LOW (ref 8.9–10.3)
Chloride: 112 mmol/L — ABNORMAL HIGH (ref 98–111)
Creatinine, Ser: 1.42 mg/dL — ABNORMAL HIGH (ref 0.44–1.00)
GFR, Estimated: 37 mL/min — ABNORMAL LOW (ref >60)
Glucose, Bld: 100 mg/dL — ABNORMAL HIGH (ref 70–99)
Potassium: 4.6 mmol/L (ref 3.5–5.1)
Sodium: 146 mmol/L — ABNORMAL HIGH (ref 135–145)
Total Bilirubin: 0.4 mg/dL (ref 0.3–1.2)
Total Protein: 7 g/dL (ref 6.5–8.1)

## 2021-03-31 LAB — GLUCOSE, CAPILLARY: Glucose-Capillary: 105 mg/dL — ABNORMAL HIGH (ref 70–99)

## 2021-03-31 MED ORDER — FLUDEOXYGLUCOSE F - 18 (FDG) INJECTION
6.5000 | Freq: Once | INTRAVENOUS | Status: AC
  Administered 2021-03-31: 6.5 via INTRAVENOUS

## 2021-03-31 MED ORDER — PALBOCICLIB 75 MG PO TABS: 75.0000 mg | ORAL_TABLET | Freq: Every day | ORAL | 1 refills | Status: DC

## 2021-04-01 LAB — CANCER ANTIGEN 27.29: CA 27.29: 66.8 U/mL — ABNORMAL HIGH (ref 0.0–38.6)

## 2021-04-03 ENCOUNTER — Telehealth: Payer: Self-pay

## 2021-04-03 NOTE — Telephone Encounter (Signed)
I spoke with Shirley Wells and let her know the PET scan results.  She verbalized understanding.

## 2021-04-03 NOTE — Telephone Encounter (Signed)
-----   Message from Truitt Merle, MD sent at 04/03/2021  6:58 AM EDT ----- Please call pt and let her PET scan good news, thanks   Truitt Merle  04/03/2021

## 2021-04-12 DIAGNOSIS — Z0120 Encounter for dental examination and cleaning without abnormal findings: Secondary | ICD-10-CM | POA: Insufficient documentation

## 2021-04-13 ENCOUNTER — Other Ambulatory Visit: Payer: Self-pay

## 2021-04-13 ENCOUNTER — Telehealth: Payer: Self-pay

## 2021-04-13 ENCOUNTER — Ambulatory Visit: Payer: Medicare FFS

## 2021-04-13 NOTE — Telephone Encounter (Signed)
Patient was scheduled for AWV via phone today at 2:00p.  Not able to get in touch with patient.

## 2021-05-02 ENCOUNTER — Other Ambulatory Visit: Payer: Self-pay | Admitting: Internal Medicine

## 2021-05-10 NOTE — Progress Notes (Signed)
[place]   [phone] Fax:([phone]) [phone]   Clinic Follow up Note   Patient Care Team: Binnie Rail, MD as PCP - General (Internal Medicine) Garvin Fila, MD as Consulting Physician (Neurology) Rolm Bookbinder, MD as Consulting Physician (General Surgery) Truitt Merle, MD as Consulting Physician (Hematology) Kyung Rudd, MD as Consulting Physician (Radiation Oncology)  Date of Service:  05/15/2021  CHIEF COMPLAINT: F/u of metastatic breast cancer   SUMMARY OF ONCOLOGIC HISTORY: Oncology History Overview Note  Cancer Staging Malignant neoplasm of upper-inner quadrant of left breast in female, estrogen receptor positive ([place]) Staging form: Breast, AJCC 8th Edition - Clinical stage from 06/18/2017: Stage IB (cT2, cN0, cM0, G2, ER: Positive, PR: Positive, HER2: Negative) - Signed by Truitt Merle, MD on 06/24/2017 - Pathologic stage from 07/30/2017: Stage IA (pT2, pN0, cM0, G2, ER: Positive, PR: Positive, HER2: Negative, Oncotype DX score: 23) - Signed by Truitt Merle, MD on 08/23/2017     Malignant neoplasm of upper-inner quadrant of left breast in female, estrogen receptor positive ([place])  06/17/2017 Mammogram   [place] and MM diagnostic Breast Tomo Bilateral  IMPRESSION: 1. 3.4 palpable mass in the 10 o'clock position of the left breast is highly suspicious for malignancy.  2. No evidence of malignancy in the right breast.  3. Ultrasound of the left axilla shows normal sized axillary lymph nodes.   06/18/2017 Initial Biopsy   Diagnosis 06/18/17 Breast, left, needle core biopsy, 10:00 o'clock - INVASIVE MAMMARY CARCINOMA. At least G2  The malignant cells are negative for E-Cadherin, supporting a lobular phenotype.    06/18/2017 Initial Diagnosis   Malignant neoplasm of upper-inner quadrant of left breast in female, estrogen receptor positive ([place])    06/18/2017 Receptors her2   ER 95%+, PR 25%+, both strong staining  HER2- Ki67 30%     07/30/2017 Surgery    LEFT TOTAL MASTECTOMY WITH AXILLARY SENTINEL LYMPH NODE BIOPSY by Dr. Donne Hazel on 07/30/17    07/30/2017 Pathology Results   Diagnosis 07/30/17 1. Breast, simple mastectomy, Left Total - INVASIVE LOBULAR CARCINOMA, GRADE 2, SPANNING 3.5 CM. - LOBULAR CARCINOMA IN SITU. - INVASIVE CARCINOMA IS BROADLY 0.1 CM FROM POSTERIOR MARGIN. - PERINEURAL INVASION PRESENT. - SEE ONCOLOGY TABLE. 2. Lymph node, sentinel, biopsy, Left Axillary - ONE OF ONE LYMPH NODES NEGATIVE FOR CARCINOMA (0/1). 3. Lymph node, sentinel, biopsy, Left - ONE OF ONE LYMPH NODES NEGATIVE FOR CARCINOMA (0/1).    07/30/2017 Oncotype testing   Oncotype 07/30/17 Recurrence score is 23, an intermediate risk With a 10-year risk of recurrence at 15% with tamoxifen alone.      11/11/2020 Imaging   MRI Lumbar Spine  IMPRESSION: Diffuse osseous metastases. Chronic inferior L2 endplate deformity with mild height loss.   Likely late subacute pathologic fracture deformities involving the superior L1 endplate and right L3 lamina.   Multilevel spondylosis. Moderate to severe spinal canal and neural foraminal narrowing at the L3-4 and L4-5 levels.   Mild to moderate spinal canal and neural foraminal narrowing at the L5-S1 level.   11/28/2020 PET scan   IMPRESSION: 1. Widespread and diffuse hypermetabolic bony metastases. 2. No evidence for soft tissue metastases in the neck, chest, abdomen, or pelvis. 3.  Aortic Atherosclerois (ICD10-170.0)     11/29/2020 -  Chemotherapy   Zometa q23month starting 11/29/20. Held after first dose. Given her CKD will only give when she has hypercalcemia and may switch to Prolia.     12/04/2020 Imaging   MRI Brain  IMPRESSION:  No evidence of intracranial metastatic disease.   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   Mild chronic microvascular ischemic changes. Small chronic right cerebellar infarct.     12/07/2020 - 12/21/2020 Radiation Therapy   palliative RT to left hip  with Dr Lisbeth Renshaw on 12/07/20-12/21/20   12/09/2020 Pathology Results   FINAL MICROSCOPIC DIAGNOSIS:   A. BONE, BIOPSY:  - Metastatic carcinoma.  - See comment.   COMMENT:  The morphology is compatible with metastatic lobular breast carcinoma.  Estrogen receptor, progesterone receptor and HER2/neu will be performed.   ADDENDUM:   PROGNOSTIC INDICATOR RESULTS:   Immunohistochemical and morphometric analysis performed manually   The tumor cells are EQUIVOCAL for Her2 (2+).  Her2 by FISH will be performed and the results will be reported  separately.   Estrogen Receptor:       POSITIVE, 50%, MODERATE-STRONG STAINING  Progesterone Receptor:   POSITIVE, 15%, MODERATE-STRONG STAINING    11/2020 -  Anti-estrogen oral therapy   First-line Anti-estrogen therapy Anastrozole 30m daily starting in 11/2020 and Ibrance starting in 12/2020    01/09/2021 -  Chemotherapy   Ibrance 3 weeks on/1 weeks off starting beginning of 01/09/21. Reduced to 722mstarting with C2 (02/13/21) due to cytopenia and fatigue. Reduced to 7534m weeks on/1 week off starting C3 on 03/13/21)    03/31/2021 PET scan   IMPRESSION: 1. Dramatic reduction in metabolic activity throughout the skeleton. Metabolic activity of diffuse sclerotic skeletal lesions is now essentially at background blood pool level. No focal metabolic activity. 2. Persistent diffuse confluent sclerotic skeletal metastasis unchanged. 3. No evidence of soft tissue breast cancer metastasis.      CURRENT THERAPY:  Zometa q3mo32montharting 11/29/20. Given her CKD will only give when she has hypercalcemia and may switch to Prolia.  First-line Anti-estrogen therapy Anastrozole 1mg 51mly starting end of 11/2020 + Ibrance 3 weeks on/1 weeks off starting beginning of 01/09/21. Reduced to 75mg 81mting with C2 (02/13/21) due to cytopenia and fatigue. Reduced to 75mg 275mks on/1 week off starting C3 on 03/13/21.  INTERVAL HISTORY:  Shirley Wells for a follow up  of metastatic breast cancer. She was last seen by me 03/31/21. She presents to the clinic with her daughter. She notes she is eating better now and able to gain 4 pounds. She notes she is tolerating Ibrance at 2 weeks on/1 week off. I reviewed her medication list with her. Her pain is much better and does not require pain medication Norco. She notes she still has pain from prior shingles. She plans to get shingles vaccine with her PCP later this year. She has not had COVID booster.    REVIEW OF SYSTEMS:   Constitutional: Denies fevers, chills or abnormal weight loss Eyes: Denies blurriness of vision Ears, nose, mouth, throat, and face: Denies mucositis or sore throat Respiratory: Denies cough, dyspnea or wheezes Cardiovascular: Denies palpitation, chest discomfort or lower extremity swelling Gastrointestinal:  Denies nausea, heartburn or change in bowel habits Skin: Denies abnormal skin rashes Lymphatics: Denies new lymphadenopathy or easy bruising Neurological:Denies numbness, tingling or new weaknesses Behavioral/Psych: Mood is stable, no new changes  All other systems were reviewed with the patient and are negative.  MEDICAL HISTORY:  Past Medical History:  Diagnosis Date   CHICKENPOX, HX OF 03/21/2010   Qualifier: Diagnosis of  By: Brand RMarca [place]     CVA (cerebral infarction) 02/2010   no residual deficits, a/w mid basal art stenosis, declined IC stent  as rec by IR/neuro   Dyslipidemia    intol of statin due to >3x increase LFTs   GERD (gastroesophageal reflux disease)    hx   Hypertension, essential    Malignant neoplasm of upper-inner quadrant of left breast in female, estrogen receptor positive ([place]) 06/20/2017   Osteoarthritis    Stroke [facility])     SURGICAL HISTORY: Past Surgical History:  Procedure Laterality Date   CHOLECYSTECTOMY  1981   MASTECTOMY W/ SENTINEL NODE BIOPSY Left 07/30/2017   Procedure: LEFT TOTAL MASTECTOMY WITH AXILLARY SENTINEL LYMPH NODE BIOPSY;  Surgeon:  Rolm Bookbinder, MD;  Location: [place];  Service: General;  Laterality: Left;    I have reviewed the social history and family history with the patient and they are unchanged from previous note.  ALLERGIES:  is allergic to influenza vaccines, pneumococcal vaccines, simvastatin, tetanus toxoids, and zoster vaccine live.  MEDICATIONS:  Current Outpatient Medications  Medication Sig Dispense Refill   acetaminophen (TYLENOL) 650 MG CR tablet Take 1,300 mg by mouth every 8 (eight) hours as needed for pain.     amLODipine (NORVASC) 5 MG tablet Take 1 tablet (5 mg total) by mouth daily. 90 tablet 3   anastrozole (ARIMIDEX) 1 MG tablet Take 1 tablet (1 mg total) by mouth daily. 90 tablet 3   aspirin 81 MG tablet Take 81 mg by mouth 2 (two) times a week.     clopidogrel (PLAVIX) 75 MG tablet TAKE 1 TABLET(75 MG) BY MOUTH DAILY (Patient taking differently: Take 75 mg by mouth daily.) 90 tablet 1   furosemide (LASIX) 20 MG tablet Take 20 mg by mouth daily as needed for fluid.     hydrALAZINE (APRESOLINE) 25 MG tablet TAKE 1 TABLET(25 MG) BY MOUTH IN THE MORNING AND AT BEDTIME 60 tablet 5   HYDROcodone-acetaminophen (NORCO/VICODIN) 5-325 MG tablet Take 1-2 tablets by mouth every 4 (four) hours as needed for severe pain. 60 tablet 0   lisinopril (ZESTRIL) 10 MG tablet TAKE 1 TABLET(10 MG) BY MOUTH DAILY 90 tablet 0   loratadine (CLARITIN) 10 MG tablet Take 10 mg by mouth daily as needed for allergies.     Menthol, Topical Analgesic, (BIOFREEZE EX) Apply 1 application topically daily as needed (muscle pain).     Multiple Vitamin (MULTIVITAMIN) capsule Take 1 capsule by mouth daily.     ondansetron (ZOFRAN) 8 MG tablet Take 1 tablet (8 mg total) by mouth every 8 (eight) hours as needed for nausea or vomiting. 30 tablet 3   palbociclib (IBRANCE) 75 MG tablet Take 1 tablet (75 mg total) by mouth daily. Take for 14 days on, 7 days off, repeat every 21 days. 14 tablet 1   prochlorperazine (COMPAZINE) 10 MG  tablet Take 1 tablet (10 mg total) by mouth every 6 (six) hours as needed for nausea or vomiting. 30 tablet 1   No current facility-administered medications for this visit.    PHYSICAL EXAMINATION: ECOG PERFORMANCE STATUS: 1 - Symptomatic but completely ambulatory  Vitals:   05/15/21 1126  BP: (!) 162/75  Pulse: 72  Resp: 17  Temp: 97.8 F (36.6 C)  SpO2: 99%   Filed Weights   05/15/21 1126  Weight: 132 lb 14.4 oz (60.3 kg)    Due to COVID19 we will limit examination to appearance. Patient had no complaints.  GENERAL:alert, no distress and comfortable SKIN: skin color normal, no rashes or significant lesions EYES: normal, Conjunctiva are pink and non-injected, sclera clear  NEURO: alert & oriented x 3  with fluent speech   LABORATORY DATA:  I have reviewed the data as listed CBC Latest Ref Rng & Units 05/15/2021 03/31/2021 03/06/2021  WBC 4.0 - 10.5 K/uL 2.9(L) 2.3(L) 1.6(L)  Hemoglobin 12.0 - 15.0 g/dL 10.6(L) 9.2(L) 9.1(L)  Hematocrit 36.0 - 46.0 % 31.6(L) 28.6(L) 27.5(L)  Platelets 150 - 400 K/uL 167 216 132(L)     CMP Latest Ref Rng & Units 05/15/2021 03/31/2021 03/06/2021  Glucose 70 - 99 mg/dL 101(H) 100(H) 110(H)  BUN 8 - 23 mg/dL 41(H) 26(H) 22  Creatinine 0.44 - 1.00 mg/dL 1.42(H) 1.42(H) 1.58(H)  Sodium 135 - 145 mmol/L 142 146(H) 140  Potassium 3.5 - 5.1 mmol/L 4.4 4.6 4.3  Chloride 98 - 111 mmol/L 111 112(H) 111  CO2 22 - 32 mmol/L 21(L) 23 16(L)  Calcium 8.9 - 10.3 mg/dL 9.4 8.6(L) 8.9  Total Protein 6.5 - 8.1 g/dL 7.1 7.0 7.2  Total Bilirubin 0.3 - 1.2 mg/dL 0.6 0.4 0.4  Alkaline Phos 38 - 126 U/L 238(H) 381(H) 554(H)  AST 15 - 41 U/L 25 25 20   ALT 0 - 44 U/L 19 13 14       RADIOGRAPHIC STUDIES: I have personally reviewed the radiological images as listed and agreed with the findings in the report. No results found.   ASSESSMENT & PLAN:  Shirley Wells is a 80 y.o. female with   1. 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 She was treated with left total mastectomy. She did not require radiation and declined antiestrogen therapy due to concerns of side effects. She lost follow up after 07/2017.   -After 6 months of left hip and ribcage pain she was found to have diffuse bone metastasis on 11/28/20 PET. This was confirmed on 12/09/20 bone biopsy which showed metastatic carcinoma compatible from prior breast cancer, ER50%+, PR 15% positive and HER2 negative. -Given her metastatic breast cancer, her cancer is no longer curable but still treatable. I started her on first-line Anastrozole in 11/2020 and Ibrance 123m 3 weeks on/1 week off starting 01/09/21. Reduced to 754mstarting with C2 and reduced to 2 weeks on/1 week off starting with C3 on 03/13/21. She is tolerating better.  -Her PET from 03/31/21 showed dramatic reduction in metabolic activity throughout the skeleton. Persistent diffuse confluent sclerotic skeletal metastasis unchanged. No evidence of soft tissue breast cancer metastasis. I reviewed with patient today.  -Her disease is responding well to treatment, will continue. She continues to tolerate treatment well with dose reduction. Labs reviewed, CBC and CMP WNL except WBC 2.9, HG 10.6, ANC 6.  -Continue Anastrozole daily.  -Will continue Ibrance 7517mdaily 2 weeks on/1 week off, will start next cycle on 05/23/21.  -She does have more hair loss from Tamoxifen. I recommend she use Biotin OTC.  -F/u in 9 weeks given she is doing well.    2. Diffuse bone metastasis, L1/L3 fracture, left hip and rib cage pain, hypercalcemia -She presented with significant left hip and rib cage pain for 6 months, lumbar MRI showed diffuse bone metastasis and subacute pathologic fracture of L1 and L3.  -Her 11/28/20 PET scan showed diffuse bone metastasis in pelvis, femur, spine, and  ribs. No visceral metastasis. Her bone biopsy from 12/09/20 showed metastatic carcinoma compatible from prior breast cancer.  -She completed palliative RT with Dr MooLisbeth Renshaw 12/07/20-12/21/20  for severe left hip pain. -She started Zometa on 11/29/20 for hypercalcinemia, but given her CKD this has been held. I will review the option of Prolia when her CKD improves. -She is not having significant back pain or tenderness from her lumbar fracture, Dr. Pascal Lux does not recommend osteocool or kyphoplasty at this point. -Her 03/31/21 PET showed dramatic improvement of her bone mets. Her pain has much improved. Has not required Norco recently.    3. Anorexia, weight loss, nausea, weakness/fatigue  -I recommend to ambulate with a cane at home due to her back pain and generalized weakness. -She did not get much help from Mirtazapine and Marinol made her eye blurred. -I encouraged her to increase protein and calorie in diet. Her appetite has improved and able to gain weight. She has been using protein shake recently.    4. Chronic comorbidities: Pre-DM, CKD stage III, history of CVA, HL, osteopenia -On amlodipine, clopidogrel, hydralazine, aspirin, furosemide, lisinopril, -She has had osteoporosis as seen on 2016 and 08/2017 DEXA. Her 08/2020 DEXA improved to osteopenia. Due to hypercalcemia, will hold calcium for now, she can continue vitamin D -Continue to F/u with PCP Dr. Billey Gosling   5. Anemia, Pancytopenia  -Inially mild anemia in 08/2020, now moderate in the past 2 weeks. This is likely related to bone mets.  -Continue oral iron.  -She has pancytopenia from [place]. Improved with Ibrance dose reduction. No Thrombocytopenia recently -I encouraged her to proceed with COVID booster. She is agreeable.     PLAN:  -Continue Anastrozole daily.  -Continue Ibrance 68m, 2 weeks on/1 week off.  Start next cycle on 05/23/21. I refilled today  -Lab and f/u in 9 weeks    No problem-specific Assessment & Plan notes  found for this encounter.   No orders of the defined types were placed in this encounter.  All questions were answered. The patient knows to call the clinic with any problems, questions or concerns. No barriers to learning was detected. The total time spent in the appointment was 30 minutes.     YTruitt Merle MD 05/15/2021   I, AJoslyn Devon am acting as scribe for YTruitt Merle MD.   I have reviewed the above documentation for accuracy and completeness, and I agree with the above.

## 2021-05-12 ENCOUNTER — Other Ambulatory Visit: Payer: Medicare FFS

## 2021-05-12 ENCOUNTER — Ambulatory Visit: Payer: Medicare FFS | Admitting: Hematology

## 2021-05-15 ENCOUNTER — Inpatient Hospital Stay: Payer: Medicare FFS

## 2021-05-15 ENCOUNTER — Encounter: Payer: Self-pay | Admitting: Hematology

## 2021-05-15 ENCOUNTER — Inpatient Hospital Stay: Payer: Medicare FFS | Attending: Hematology | Admitting: Hematology

## 2021-05-15 ENCOUNTER — Other Ambulatory Visit: Payer: Self-pay

## 2021-05-15 VITALS — BP 162/75 | HR 72 | Temp 97.8°F | Resp 17 | Ht 62.0 in | Wt 132.9 lb

## 2021-05-15 DIAGNOSIS — N183 Chronic kidney disease, stage 3 unspecified: Secondary | ICD-10-CM | POA: Insufficient documentation

## 2021-05-15 DIAGNOSIS — Z9012 Acquired absence of left breast and nipple: Secondary | ICD-10-CM | POA: Insufficient documentation

## 2021-05-15 DIAGNOSIS — Z8673 Personal history of transient ischemic attack (TIA), and cerebral infarction without residual deficits: Secondary | ICD-10-CM | POA: Insufficient documentation

## 2021-05-15 DIAGNOSIS — C7951 Secondary malignant neoplasm of bone: Secondary | ICD-10-CM | POA: Insufficient documentation

## 2021-05-15 DIAGNOSIS — Z9049 Acquired absence of other specified parts of digestive tract: Secondary | ICD-10-CM | POA: Diagnosis not present

## 2021-05-15 DIAGNOSIS — Z170 Estrogen receptor positive status [ER+]: Secondary | ICD-10-CM | POA: Diagnosis not present

## 2021-05-15 DIAGNOSIS — D61818 Other pancytopenia: Secondary | ICD-10-CM | POA: Insufficient documentation

## 2021-05-15 DIAGNOSIS — Z7902 Long term (current) use of antithrombotics/antiplatelets: Secondary | ICD-10-CM | POA: Diagnosis not present

## 2021-05-15 DIAGNOSIS — Z79899 Other long term (current) drug therapy: Secondary | ICD-10-CM | POA: Insufficient documentation

## 2021-05-15 DIAGNOSIS — Z79811 Long term (current) use of aromatase inhibitors: Secondary | ICD-10-CM | POA: Diagnosis not present

## 2021-05-15 DIAGNOSIS — E785 Hyperlipidemia, unspecified: Secondary | ICD-10-CM | POA: Diagnosis not present

## 2021-05-15 DIAGNOSIS — C50212 Malignant neoplasm of upper-inner quadrant of left female breast: Secondary | ICD-10-CM | POA: Insufficient documentation

## 2021-05-15 LAB — CMP (CANCER CENTER ONLY)
ALT: 19 U/L (ref 0–44)
AST: 25 U/L (ref 15–41)
Albumin: 3.8 g/dL (ref 3.5–5.0)
Alkaline Phosphatase: 238 U/L — ABNORMAL HIGH (ref 38–126)
Anion gap: 10 (ref 5–15)
BUN: 41 mg/dL — ABNORMAL HIGH (ref 8–23)
CO2: 21 mmol/L — ABNORMAL LOW (ref 22–32)
Calcium: 9.4 mg/dL (ref 8.9–10.3)
Chloride: 111 mmol/L (ref 98–111)
Creatinine: 1.42 mg/dL — ABNORMAL HIGH (ref 0.44–1.00)
GFR, Estimated: 37 mL/min — ABNORMAL LOW (ref >60)
Glucose, Bld: 101 mg/dL — ABNORMAL HIGH (ref 70–99)
Potassium: 4.4 mmol/L (ref 3.5–5.1)
Sodium: 142 mmol/L (ref 135–145)
Total Bilirubin: 0.6 mg/dL (ref 0.3–1.2)
Total Protein: 7.1 g/dL (ref 6.5–8.1)

## 2021-05-15 LAB — CBC WITH DIFFERENTIAL/PLATELET
Abs Immature Granulocytes: 0 10*3/uL (ref 0.00–0.07)
Basophils Absolute: 0.1 10*3/uL (ref 0.0–0.1)
Basophils Relative: 2 %
Eosinophils Absolute: 0.1 10*3/uL (ref 0.0–0.5)
Eosinophils Relative: 4 %
HCT: 31.6 % — ABNORMAL LOW (ref 36.0–46.0)
Hemoglobin: 10.6 g/dL — ABNORMAL LOW (ref 12.0–15.0)
Immature Granulocytes: 0 %
Lymphocytes Relative: 33 %
Lymphs Abs: 0.9 10*3/uL (ref 0.7–4.0)
MCH: 40.2 pg — ABNORMAL HIGH (ref 26.0–34.0)
MCHC: 33.5 g/dL (ref 30.0–36.0)
MCV: 119.7 fL — ABNORMAL HIGH (ref 80.0–100.0)
Monocytes Absolute: 0.2 10*3/uL (ref 0.1–1.0)
Monocytes Relative: 6 %
Neutro Abs: 1.6 10*3/uL — ABNORMAL LOW (ref 1.7–7.7)
Neutrophils Relative %: 55 %
Platelets: 167 10*3/uL (ref 150–400)
RBC: 2.64 MIL/uL — ABNORMAL LOW (ref 3.87–5.11)
RDW: 15 % (ref 11.5–15.5)
WBC: 2.9 10*3/uL — ABNORMAL LOW (ref 4.0–10.5)
nRBC: 0 % (ref 0.0–0.2)

## 2021-05-15 MED ORDER — PALBOCICLIB 75 MG PO TABS: 75.0000 mg | ORAL_TABLET | Freq: Every day | ORAL | 1 refills | Status: DC

## 2021-05-15 MED ORDER — ANASTROZOLE 1 MG PO TABS: 1.0000 mg | ORAL_TABLET | Freq: Every day | ORAL | 3 refills | Status: DC

## 2021-05-16 LAB — CANCER ANTIGEN 27.29: CA 27.29: 40 U/mL — ABNORMAL HIGH (ref 0.0–38.6)

## 2021-07-17 ENCOUNTER — Inpatient Hospital Stay: Payer: Medicare FFS | Admitting: Hematology

## 2021-07-17 ENCOUNTER — Encounter: Payer: Self-pay | Admitting: Hematology

## 2021-07-17 ENCOUNTER — Inpatient Hospital Stay: Payer: Medicare FFS | Attending: Hematology

## 2021-07-17 ENCOUNTER — Other Ambulatory Visit: Payer: Self-pay

## 2021-07-17 VITALS — BP 158/55 | HR 56 | Temp 97.5°F | Resp 18 | Ht 62.0 in | Wt 134.4 lb

## 2021-07-17 DIAGNOSIS — I129 Hypertensive chronic kidney disease with stage 1 through stage 4 chronic kidney disease, or unspecified chronic kidney disease: Secondary | ICD-10-CM | POA: Diagnosis not present

## 2021-07-17 DIAGNOSIS — Z79811 Long term (current) use of aromatase inhibitors: Secondary | ICD-10-CM | POA: Insufficient documentation

## 2021-07-17 DIAGNOSIS — E1122 Type 2 diabetes mellitus with diabetic chronic kidney disease: Secondary | ICD-10-CM | POA: Insufficient documentation

## 2021-07-17 DIAGNOSIS — Z9012 Acquired absence of left breast and nipple: Secondary | ICD-10-CM | POA: Insufficient documentation

## 2021-07-17 DIAGNOSIS — Z170 Estrogen receptor positive status [ER+]: Secondary | ICD-10-CM | POA: Diagnosis not present

## 2021-07-17 DIAGNOSIS — C50212 Malignant neoplasm of upper-inner quadrant of left female breast: Secondary | ICD-10-CM | POA: Diagnosis not present

## 2021-07-17 DIAGNOSIS — Z79899 Other long term (current) drug therapy: Secondary | ICD-10-CM | POA: Diagnosis not present

## 2021-07-17 DIAGNOSIS — C7951 Secondary malignant neoplasm of bone: Secondary | ICD-10-CM | POA: Diagnosis not present

## 2021-07-17 DIAGNOSIS — N183 Chronic kidney disease, stage 3 unspecified: Secondary | ICD-10-CM | POA: Insufficient documentation

## 2021-07-17 DIAGNOSIS — Z8673 Personal history of transient ischemic attack (TIA), and cerebral infarction without residual deficits: Secondary | ICD-10-CM | POA: Diagnosis not present

## 2021-07-17 DIAGNOSIS — Z7902 Long term (current) use of antithrombotics/antiplatelets: Secondary | ICD-10-CM | POA: Diagnosis not present

## 2021-07-17 LAB — CBC WITH DIFFERENTIAL/PLATELET
Abs Immature Granulocytes: 0.02 10*3/uL (ref 0.00–0.07)
Basophils Absolute: 0.1 10*3/uL (ref 0.0–0.1)
Basophils Relative: 2 %
Eosinophils Absolute: 0.1 10*3/uL (ref 0.0–0.5)
Eosinophils Relative: 3 %
HCT: 28.5 % — ABNORMAL LOW (ref 36.0–46.0)
Hemoglobin: 10 g/dL — ABNORMAL LOW (ref 12.0–15.0)
Immature Granulocytes: 1 %
Lymphocytes Relative: 28 %
Lymphs Abs: 0.9 10*3/uL (ref 0.7–4.0)
MCH: 40.3 pg — ABNORMAL HIGH (ref 26.0–34.0)
MCHC: 35.1 g/dL (ref 30.0–36.0)
MCV: 114.9 fL — ABNORMAL HIGH (ref 80.0–100.0)
Monocytes Absolute: 0.2 10*3/uL (ref 0.1–1.0)
Monocytes Relative: 7 %
Neutro Abs: 1.9 10*3/uL (ref 1.7–7.7)
Neutrophils Relative %: 59 %
Platelets: 179 10*3/uL (ref 150–400)
RBC: 2.48 MIL/uL — ABNORMAL LOW (ref 3.87–5.11)
RDW: 13.4 % (ref 11.5–15.5)
WBC: 3.1 10*3/uL — ABNORMAL LOW (ref 4.0–10.5)
nRBC: 0 % (ref 0.0–0.2)

## 2021-07-17 LAB — COMPREHENSIVE METABOLIC PANEL
ALT: 42 U/L (ref 0–44)
AST: 38 U/L (ref 15–41)
Albumin: 3.2 g/dL — ABNORMAL LOW (ref 3.5–5.0)
Alkaline Phosphatase: 235 U/L — ABNORMAL HIGH (ref 38–126)
Anion gap: 10 (ref 5–15)
BUN: 33 mg/dL — ABNORMAL HIGH (ref 8–23)
CO2: 24 mmol/L (ref 22–32)
Calcium: 9.3 mg/dL (ref 8.9–10.3)
Chloride: 113 mmol/L — ABNORMAL HIGH (ref 98–111)
Creatinine, Ser: 1.57 mg/dL — ABNORMAL HIGH (ref 0.44–1.00)
GFR, Estimated: 33 mL/min — ABNORMAL LOW (ref >60)
Glucose, Bld: 110 mg/dL — ABNORMAL HIGH (ref 70–99)
Potassium: 4.6 mmol/L (ref 3.5–5.1)
Sodium: 147 mmol/L — ABNORMAL HIGH (ref 135–145)
Total Bilirubin: 0.5 mg/dL (ref 0.3–1.2)
Total Protein: 6.7 g/dL (ref 6.5–8.1)

## 2021-07-17 MED ORDER — PALBOCICLIB 75 MG PO TABS: 75.0000 mg | ORAL_TABLET | Freq: Every day | ORAL | 3 refills | Status: DC

## 2021-07-17 NOTE — Progress Notes (Signed)
[place]   [phone] [phone]) [phone]   Clinic Follow up Note   Patient Care Team: Binnie Rail, MD as PCP - General (Internal Medicine) Garvin Fila, MD as Consulting Physician (Neurology) Rolm Bookbinder, MD as Consulting Physician (General Surgery) Truitt Merle, MD as Consulting Physician (Hematology) Kyung Rudd, MD as Consulting Physician (Radiation Oncology)  Date of Service:  07/17/2021  CHIEF COMPLAINT: f/u of metastatic breast cancer  SUMMARY OF ONCOLOGIC HISTORY: Oncology History Overview Note  Cancer Staging Malignant neoplasm of upper-inner quadrant of left breast in female, estrogen receptor positive (Index) Staging form: Breast, AJCC 8th Edition - Clinical stage from 06/18/2017: Stage IB (cT2, cN0, cM0, G2, ER: Positive, PR: Positive, HER2: Negative) - Signed by Truitt Merle, MD on 06/24/2017 - Pathologic stage from 07/30/2017: Stage IA (pT2, pN0, cM0, G2, ER: Positive, PR: Positive, HER2: Negative, Oncotype DX score: 23) - Signed by Truitt Merle, MD on 08/23/2017     Malignant neoplasm of upper-inner quadrant of left breast in female, estrogen receptor positive ([place])  06/17/2017 Mammogram   [place] and MM diagnostic Breast Tomo Bilateral  IMPRESSION: 1. 3.4 palpable mass in the 10 o'clock position of the left breast is highly suspicious for malignancy.  2. No evidence of malignancy in the right breast.  3. Ultrasound of the left axilla shows normal sized axillary lymph nodes.   06/18/2017 Initial Biopsy   Diagnosis 06/18/17 Breast, left, needle core biopsy, 10:00 o'clock - INVASIVE MAMMARY CARCINOMA. At least G2  The malignant cells are negative for E-Cadherin, supporting a lobular phenotype.   06/18/2017 Initial Diagnosis   Malignant neoplasm of upper-inner quadrant of left breast in female, estrogen receptor positive ([place])   06/18/2017 Receptors her2   ER 95%+, PR 25%+, both strong staining  HER2- Ki67 30%    07/30/2017 Surgery   LEFT  TOTAL MASTECTOMY WITH AXILLARY SENTINEL LYMPH NODE BIOPSY by Dr. Donne Hazel on 07/30/17   07/30/2017 Pathology Results   Diagnosis 07/30/17 1. Breast, simple mastectomy, Left Total - INVASIVE LOBULAR CARCINOMA, GRADE 2, SPANNING 3.5 CM. - LOBULAR CARCINOMA IN SITU. - INVASIVE CARCINOMA IS BROADLY 0.1 CM FROM POSTERIOR MARGIN. - PERINEURAL INVASION PRESENT. - SEE ONCOLOGY TABLE. 2. Lymph node, sentinel, biopsy, Left Axillary - ONE OF ONE LYMPH NODES NEGATIVE FOR CARCINOMA (0/1). 3. Lymph node, sentinel, biopsy, Left - ONE OF ONE LYMPH NODES NEGATIVE FOR CARCINOMA (0/1).   07/30/2017 Oncotype testing   Oncotype 07/30/17 Recurrence score is 23, an intermediate risk With a 10-year risk of recurrence at 15% with tamoxifen alone.     11/11/2020 Imaging   MRI Lumbar Spine  IMPRESSION: Diffuse osseous metastases. Chronic inferior L2 endplate deformity with mild height loss.   Likely late subacute pathologic fracture deformities involving the superior L1 endplate and right L3 lamina.   Multilevel spondylosis. Moderate to severe spinal canal and neural foraminal narrowing at the L3-4 and L4-5 levels.   Mild to moderate spinal canal and neural foraminal narrowing at the L5-S1 level.   11/28/2020 PET scan   IMPRESSION: 1. Widespread and diffuse hypermetabolic bony metastases. 2. No evidence for soft tissue metastases in the neck, chest, abdomen, or pelvis. 3.  Aortic Atherosclerois (ICD10-170.0)     11/29/2020 -  Chemotherapy   Zometa q59month starting 11/29/20. Held after first dose. Given her CKD will only give when she has hypercalcemia and may switch to Prolia.     12/04/2020 Imaging   MRI Brain  IMPRESSION: No evidence of intracranial metastatic disease.  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   Mild chronic microvascular ischemic changes. Small chronic right cerebellar infarct.     12/07/2020 - 12/21/2020 Radiation Therapy   palliative RT to left hip with Dr Lisbeth Renshaw  on 12/07/20-12/21/20   12/09/2020 Pathology Results   FINAL MICROSCOPIC DIAGNOSIS:   A. BONE, BIOPSY:  - Metastatic carcinoma.  - See comment.   COMMENT:  The morphology is compatible with metastatic lobular breast carcinoma.  Estrogen receptor, progesterone receptor and HER2/neu will be performed.   ADDENDUM:   PROGNOSTIC INDICATOR RESULTS:   Immunohistochemical and morphometric analysis performed manually   The tumor cells are EQUIVOCAL for Her2 (2+).  Her2 by FISH will be performed and the results will be reported  separately.   Estrogen Receptor:       POSITIVE, 50%, MODERATE-STRONG STAINING  Progesterone Receptor:   POSITIVE, 15%, MODERATE-STRONG STAINING    11/2020 -  Anti-estrogen oral therapy   First-line Anti-estrogen therapy Anastrozole 95m daily starting in 11/2020 and Ibrance starting in 12/2020    01/09/2021 -  Chemotherapy   Ibrance 3 weeks on/1 weeks off starting beginning of 01/09/21. Reduced to 764mstarting with C2 (02/13/21) due to cytopenia and fatigue. Reduced to 7581m weeks on/1 week off starting C3 on 03/13/21)    03/31/2021 PET scan   IMPRESSION: 1. Dramatic reduction in metabolic activity throughout the skeleton. Metabolic activity of diffuse sclerotic skeletal lesions is now essentially at background blood pool level. No focal metabolic activity. 2. Persistent diffuse confluent sclerotic skeletal metastasis unchanged. 3. No evidence of soft tissue breast cancer metastasis.      CURRENT THERAPY:  -Zometa q3mo34montharting 11/29/20. Given her CKD will only give when she has hypercalcemia and may switch to Prolia.  -First-line Anti-estrogen therapy Anastrozole 1mg 37mly starting end of 11/2020 + Ibrance 3 weeks on/1 weeks off starting 01/09/21. Reduced to 75mg 56mting with C2 (02/13/21) due to cytopenia and fatigue. Reduced to 75mg 268mks on/1 week off starting C3 on 03/13/21.  INTERVAL HISTORY:  Shirley M ODETTA FORNESSe for a follow up of metastatic breast  cancer. She was last seen by me on 05/15/21. She presents to the clinic accompanied by her daughter. She reports occasional pain in her low back, for which she uses tylenol. She notes some fatigue but is able to do everything she wants to. Her daughter notes she is doing much better since being on the medication. She denies any hot flashes or joint pain. She notes just some joint pain in her left pinky finger. She notes she is less constipated since she is not taking pain medication.   All other systems were reviewed with the patient and are negative.  MEDICAL HISTORY:  Past Medical History:  Diagnosis Date   CHICKENPOX, HX OF 03/21/2010   Qualifier: Diagnosis of  By: Brand RMarca Anconaucy     CVA (cerebral infarction) 02/2010   no residual deficits, a/w mid basal art stenosis, declined IC stent as rec by IR/neuro   Dyslipidemia    intol of statin due to >3x increase LFTs   GERD (gastroesophageal reflux disease)    hx   Hypertension, essential    Malignant neoplasm of upper-inner quadrant of left breast in female, estrogen receptor positive (HCC) 7/[place]2018   Osteoarthritis    [facility] Past [facility] History:  Procedure Laterality Date   CHOLECYSTECTOMY  1981   MASTECTOMY W/ SENTINEL NODE BIOPSY Left 07/30/2017   Procedure: LEFT  TOTAL MASTECTOMY WITH AXILLARY SENTINEL LYMPH NODE BIOPSY;  Surgeon: Rolm Bookbinder, MD;  Location: [place] [facility];  Service: General;  Laterality: Left;    I have reviewed the social history and family history with the patient and they are unchanged from previous note.  ALLERGIES:  is allergic to influenza vaccines, pneumococcal vaccines, simvastatin, tetanus toxoids, and zoster vaccine live.  MEDICATIONS:  Current Outpatient Medications  Medication Sig Dispense Refill   acetaminophen (TYLENOL) 650 MG CR tablet Take 1,300 mg by mouth every 8 (eight) hours as needed for pain.     amLODipine (NORVASC) 5 MG tablet Take 1 tablet (5 mg total) by  mouth daily. 90 tablet 3   anastrozole (ARIMIDEX) 1 MG tablet Take 1 tablet (1 mg total) by mouth daily. 90 tablet 3   aspirin 81 MG tablet Take 81 mg by mouth 2 (two) times a week.     clopidogrel (PLAVIX) 75 MG tablet TAKE 1 TABLET(75 MG) BY MOUTH DAILY (Patient taking differently: Take 75 mg by mouth daily.) 90 tablet 1   furosemide (LASIX) 20 MG tablet Take 20 mg by mouth daily as needed for fluid.     lisinopril (ZESTRIL) 10 MG tablet TAKE 1 TABLET(10 MG) BY MOUTH DAILY 90 tablet 0   loratadine (CLARITIN) 10 MG tablet Take 10 mg by mouth daily as needed for allergies.     Menthol, Topical Analgesic, (BIOFREEZE EX) Apply 1 application topically daily as needed (muscle pain).     Multiple Vitamin (MULTIVITAMIN) capsule Take 1 capsule by mouth daily.     ondansetron (ZOFRAN) 8 MG tablet Take 1 tablet (8 mg total) by mouth every 8 (eight) hours as needed for nausea or vomiting. 30 tablet 3   palbociclib (IBRANCE) 75 MG tablet Take 1 tablet (75 mg total) by mouth daily. Take for 14 days on, 7 days off, repeat every 21 days. 14 tablet 3   prochlorperazine (COMPAZINE) 10 MG tablet Take 1 tablet (10 mg total) by mouth every 6 (six) hours as needed for nausea or vomiting. 30 tablet 1   No current facility-administered medications for this visit.    PHYSICAL EXAMINATION: ECOG PERFORMANCE STATUS: 1 - Symptomatic but completely ambulatory  Vitals:   07/17/21 1136  BP: (!) 158/55  Pulse: (!) 56  Resp: 18  Temp: (!) 97.5 F (36.4 C)  SpO2: 100%   Wt Readings from Last 3 Encounters:  07/17/21 134 lb 6.4 oz (61 kg)  05/15/21 132 lb 14.4 oz (60.3 kg)  03/31/21 128 lb 11.2 oz (58.4 kg)     GENERAL:alert, no distress and comfortable SKIN: skin color normal, no rashes or significant lesions EYES: normal, Conjunctiva are pink and non-injected, sclera clear  NEURO: alert & oriented x 3 with fluent speech  LABORATORY DATA:  I have reviewed the data as listed CBC Latest Ref Rng & Units  07/17/2021 05/15/2021 03/31/2021  WBC 4.0 - 10.5 K/uL 3.1(L) 2.9(L) 2.3(L)  Hemoglobin 12.0 - 15.0 g/dL 10.0(L) 10.6(L) 9.2(L)  Hematocrit 36.0 - 46.0 % 28.5(L) 31.6(L) 28.6(L)  Platelets 150 - 400 K/uL 179 167 216     CMP Latest Ref Rng & Units 07/17/2021 05/15/2021 03/31/2021  Glucose 70 - 99 mg/dL 110(H) 101(H) 100(H)  BUN 8 - 23 mg/dL 33(H) 41(H) 26(H)  Creatinine 0.44 - 1.00 mg/dL 1.57(H) 1.42(H) 1.42(H)  Sodium 135 - 145 mmol/L 147(H) 142 146(H)  Potassium 3.5 - 5.1 mmol/L 4.6 4.4 4.6  Chloride 98 - 111 mmol/L 113(H) 111 112(H)  CO2 22 -  32 mmol/L 24 21(L) 23  Calcium 8.9 - 10.3 mg/dL 9.3 9.4 8.6(L)  Total Protein 6.5 - 8.1 g/dL 6.7 7.1 7.0  Total Bilirubin 0.3 - 1.2 mg/dL 0.5 0.6 0.4  Alkaline Phos 38 - 126 U/L 235(H) 238(H) 381(H)  AST 15 - 41 U/L 38 25 25  ALT 0 - 44 U/L 42 19 13      RADIOGRAPHIC STUDIES: I have personally reviewed the radiological images as listed and agreed with the findings in the report. No results found.   ASSESSMENT & PLAN:  Shirley Wells is a 80 y.o. female with   1. 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 She was treated with left total mastectomy. She did not require radiation and declined antiestrogen therapy due to concerns of side effects. She lost follow up after 07/2017.   -After 6 months of left hip and ribcage pain she was found to have diffuse bone metastasis on 11/28/20 PET. This was confirmed on 12/09/20 bone biopsy which showed metastatic carcinoma compatible from prior breast cancer, ER50%+, PR 15% positive and HER2 negative. -Given her metastatic breast cancer, her cancer is no longer curable but still treatable. I started her on first-line Anastrozole in 11/2020 and Ibrance 151m 3 weeks on/1 week off starting 01/09/21. Reduced to 753mstarting with C2 and  reduced to 2 weeks on/1 week off starting with C3 on 03/13/21. She is tolerating better.  -Her PET from 03/31/21 showed dramatic reduction in metabolic activity throughout the skeleton. Persistent diffuse confluent sclerotic skeletal metastasis unchanged. No evidence of soft tissue breast cancer metastasis. -plan for restaging PET in 6 months (09/2021) -She continues to tolerate treatment well with dose reduction. Labs reviewed, CBC continues to show some mild pancytopenia. CMP and CA 27.29 pending, prior CA 27.29 on 05/15/21 was down to 40.  -Continue Anastrozole daily.  -Continue Ibrance 7519mdaily 2 weeks on/1 week off -F/u in 11 weeks with PET scan same day.    2. Diffuse bone metastasis, L1/L3 fracture, hypercalcemia -She presented with significant left hip and rib cage pain for 6 months, lumbar MRI showed diffuse bone metastasis and subacute pathologic fracture of L1 and L3.  -Her 11/28/20 PET scan showed diffuse bone metastasis in pelvis, femur, spine, and ribs. No visceral metastasis. Her bone biopsy from 12/09/20 showed metastatic carcinoma compatible from prior breast cancer.  -She completed palliative RT with Dr MooLisbeth Renshaw 12/07/20-12/21/20 for severe left hip pain. -She received one dose of Zometa on 11/29/20 for hypercalcinemia, but given her CKD this has been held.  -She is not having significant back pain or tenderness from her lumbar fracture, Dr. WatPascal Luxes not recommend osteocool or kyphoplasty at this point. -Her 03/31/21 PET showed dramatic improvement of her bone mets. Her pain has much improved, and she is no longer taking Norco.    3. Anorexia, weight loss, nausea, weakness/fatigue  -overall improved with Ibrance. She is no longer needing a cane, is eating well, and has more energy.   4. Chronic comorbidities: Pre-DM, CKD stage III, history of CVA, HL, osteopenia -On amlodipine, clopidogrel, hydralazine, aspirin, furosemide, lisinopril, -She has had osteoporosis as seen on 2016 and  08/2017 DEXA. Her 08/2020 DEXA improved to osteopenia. Due to hypercalcemia, will hold calcium for now, she can continue vitamin D -Continue to F/u with PCP Dr. StaBilley Gosling  5. Anemia, Pancytopenia  -Initially mild anemia in 08/2020. This is likely related to bone mets.  -Continue oral iron. Hgb 10 today (07/17/21) -She has pancytopenia from [place]. Improved with Ibrance dose reduction. No Thrombocytopenia recently     PLAN:  -Continue Anastrozole daily.  -Continue Ibrance 62m, 2 weeks on/1 week off. I refilled today  -Lab and PET scan in AM on 10/02/21 with f/u at 1PM   No problem-specific Assessment & Plan notes found for this encounter.   Orders Placed This Encounter  Procedures   NM PET Image Restag (PS) Skull Base To Thigh    Standing Status:   Future    Standing Expiration Date:   07/17/2022    Order Specific Question:   If indicated for the ordered procedure, I authorize the administration of a radiopharmaceutical per Radiology protocol    Answer:   Yes    Order Specific Question:   Preferred imaging location?    Answer[facility]   [facility]  All questions were answered. The patient knows to call the clinic with any problems, questions or concerns. No barriers to learning was detected. The total time spent in the appointment was 30 minutes.     YTruitt Merle MD 07/17/2021   I, KWilburn Mylar am acting as scribe for YTruitt Merle MD.   I have reviewed the above documentation for accuracy and completeness, and I agree with the above.

## 2021-07-18 LAB — CANCER ANTIGEN 27.29: CA 27.29: 21.4 U/mL (ref 0.0–38.6)

## 2021-07-20 ENCOUNTER — Encounter: Payer: Self-pay | Admitting: Hematology

## 2021-08-01 ENCOUNTER — Other Ambulatory Visit: Payer: Self-pay | Admitting: Internal Medicine

## 2021-09-14 NOTE — Patient Instructions (Addendum)
   Medications changes include :   none    Please followup in 6 months   

## 2021-09-14 NOTE — Progress Notes (Signed)
Subjective:    Patient ID: Shirley Wells, female    DOB: 24-Nov-1941, 80 y.o.   MRN: [id number]  This visit occurred during the SARS-CoV-2 public health emergency.  Safety protocols were in place, including screening questions prior to the visit, additional usage of staff PPE, and extensive cleaning of exam room while observing appropriate contact time as indicated for disinfecting solutions.     HPI The patient is here for follow up of their chronic medical problems, including metastatic breast cancer, CKD, htn, prediabetes, leg edema, CVA, dyslipidemia, osteopenia  She is taking all of her medications as prescribed.  She has no concerns.  Medications and allergies reviewed with patient and updated if appropriate.  Patient Active Problem List   Diagnosis Date Noted   Metastatic breast cancer ([place]) 03/12/2021   Hypercalcemia 11/07/2020   Elevated alkaline phosphatase level 11/07/2020   Compression fracture of L2 ([place]) 09/14/2020   Chronic bilateral low back pain 09/12/2020   Prediabetes 02/03/2019   Malignant neoplasm of upper-inner quadrant of left breast in female, estrogen receptor positive ([place]) 06/20/2017   Chronic kidney disease, stage III (moderate) ([place]) 11/02/2016   Osteopenia, high frax 04/30/2014   Edema 09/26/2010   PEPTIC ULCER DISEASE 03/21/2010   ARTHRITIS 03/21/2010   Dyslipidemia 03/20/2010   Essential hypertension 03/20/2010   History of CVA (cerebrovascular accident) 02/18/2010    Current Outpatient Medications on File Prior to Visit  Medication Sig Dispense Refill   acetaminophen (TYLENOL) 650 MG CR tablet Take 1,300 mg by mouth every 8 (eight) hours as needed for pain.     amLODipine (NORVASC) 5 MG tablet Take 1 tablet (5 mg total) by mouth daily. 90 tablet 3   anastrozole (ARIMIDEX) 1 MG tablet Take 1 tablet (1 mg total) by mouth daily. 90 tablet 3   aspirin 81 MG tablet Take 81 mg by mouth 2 (two) times a week.     clopidogrel (PLAVIX) 75 MG tablet  TAKE 1 TABLET(75 MG) BY MOUTH DAILY 90 tablet 1   furosemide (LASIX) 20 MG tablet Take 20 mg by mouth daily as needed for fluid.     lisinopril (ZESTRIL) 10 MG tablet TAKE 1 TABLET(10 MG) BY MOUTH DAILY 90 tablet 0   loratadine (CLARITIN) 10 MG tablet Take 10 mg by mouth daily as needed for allergies.     Menthol, Topical Analgesic, (BIOFREEZE EX) Apply 1 application topically daily as needed (muscle pain).     Multiple Vitamin (MULTIVITAMIN) capsule Take 1 capsule by mouth daily.     ondansetron (ZOFRAN) 8 MG tablet Take 1 tablet (8 mg total) by mouth every 8 (eight) hours as needed for nausea or vomiting. 30 tablet 3   palbociclib (IBRANCE) 75 MG tablet Take 1 tablet (75 mg total) by mouth daily. Take for 14 days on, 7 days off, repeat every 21 days. 14 tablet 3   prochlorperazine (COMPAZINE) 10 MG tablet Take 1 tablet (10 mg total) by mouth every 6 (six) hours as needed for nausea or vomiting. 30 tablet 1   No current facility-administered medications on file prior to visit.    Past Medical History:  Diagnosis Date   CHICKENPOX, HX OF 03/21/2010   Qualifier: Diagnosis of  By: Marca Ancona RMA, Lucy     CVA (cerebral infarction) 02/2010   no residual deficits, a/w mid basal art stenosis, declined IC stent as rec by IR/neuro   Dyslipidemia    intol of statin due to >3x increase LFTs  GERD (gastroesophageal reflux disease)    hx   Hypertension, essential    Malignant neoplasm of upper-inner quadrant of left breast in female, estrogen receptor positive ([place]) 06/20/2017   Osteoarthritis    Stroke [facility])     Past Surgical History:  Procedure Laterality Date   CHOLECYSTECTOMY  1981   MASTECTOMY W/ SENTINEL NODE BIOPSY Left 07/30/2017   Procedure: LEFT TOTAL MASTECTOMY WITH AXILLARY SENTINEL LYMPH NODE BIOPSY;  Surgeon: Rolm Bookbinder, MD;  Location: [place];  Service: General;  Laterality: Left;    Social History   Socioeconomic History   Marital status: Divorced    Spouse name: Not on file    Number of children: Not on file   Years of education: Not on file   Highest education level: Not on file  Occupational History   Not on file  Tobacco Use   Smoking status: Never   Smokeless tobacco: Never  Vaping Use   Vaping Use: Never used  Substance and Sexual Activity   Alcohol use: No    Alcohol/week: 0.0 standard drinks   Drug use: No   Sexual activity: Not on file  Other Topics Concern   Not on file  Social History Narrative   Divorced  >40 yrs, single and lives alone.    Supportive daughters, 1 is a Therapist, sports at [employer]).    Patient enjoys gardening, dancing and caring for her grand children.   Social Determinants of Health   Financial Resource Strain: Not on file  Food Insecurity: Not on file  Transportation Needs: No Transportation Needs   Lack of Transportation (Medical): No   Lack of Transportation (Non-Medical): No  Physical Activity: Not on file  Stress: Not on file  Social Connections: Not on file    Family History  Problem Relation Age of Onset   Breast cancer Mother 77   Arthritis Other    Breast cancer Maternal Aunt     Review of Systems  Constitutional:  Negative for chills and fever.  Respiratory:  Negative for cough, shortness of breath and wheezing.   Cardiovascular:  Negative for chest pain, palpitations and leg swelling.  Neurological:  Positive for headaches (occ). Negative for light-headedness.      Objective:   Vitals:   09/15/21 1133  BP: 140/80  Pulse: (!) 56  Temp: 98 F (36.7 C)  SpO2: 99%   BP Readings from Last 3 Encounters:  09/15/21 140/80  07/17/21 (!) 158/55  05/15/21 (!) 162/75   Wt Readings from Last 3 Encounters:  09/15/21 136 lb (61.7 kg)  07/17/21 134 lb 6.4 oz (61 kg)  05/15/21 132 lb 14.4 oz (60.3 kg)   Body mass index is 24.87 kg/m.   Physical Exam    Constitutional: Appears well-developed and well-nourished. No distress.  HENT:  Head: Normocephalic and atraumatic.  Neck: Neck supple.  No tracheal deviation present. No thyromegaly present.  No cervical lymphadenopathy Cardiovascular: Normal rate, regular rhythm and normal heart sounds.   No murmur heard. No carotid bruit .  No edema Pulmonary/Chest: Effort normal and breath sounds normal. No respiratory distress. No has no wheezes. No rales.  Skin: Skin is warm and dry. Not diaphoretic.  Psychiatric: Normal mood and affect. Behavior is normal.      Assessment & Plan:    See Problem List for Assessment and Plan of chronic medical problems.

## 2021-09-15 ENCOUNTER — Ambulatory Visit: Payer: Medicare FFS | Admitting: Internal Medicine

## 2021-09-15 ENCOUNTER — Other Ambulatory Visit: Payer: Self-pay

## 2021-09-15 ENCOUNTER — Encounter: Payer: Self-pay | Admitting: Internal Medicine

## 2021-09-15 VITALS — BP 140/80 | HR 56 | Temp 98.0°F | Ht 62.0 in | Wt 136.0 lb

## 2021-09-15 DIAGNOSIS — Z8673 Personal history of transient ischemic attack (TIA), and cerebral infarction without residual deficits: Secondary | ICD-10-CM

## 2021-09-15 DIAGNOSIS — R7303 Prediabetes: Secondary | ICD-10-CM

## 2021-09-15 DIAGNOSIS — I10 Essential (primary) hypertension: Secondary | ICD-10-CM

## 2021-09-15 DIAGNOSIS — N1832 Chronic kidney disease, stage 3b: Secondary | ICD-10-CM

## 2021-09-15 DIAGNOSIS — E785 Hyperlipidemia, unspecified: Secondary | ICD-10-CM | POA: Diagnosis not present

## 2021-09-15 NOTE — Assessment & Plan Note (Signed)
Chronic Reviewed blood work from oncology Kidney function stable

## 2021-09-15 NOTE — Assessment & Plan Note (Signed)
Chronic Glucose levels have been controlled Will defer blood work today

## 2021-09-15 NOTE — Assessment & Plan Note (Signed)
Chronic Has refused cholesterol medications Encouraged healthy diet, regular exercise

## 2021-09-15 NOTE — Assessment & Plan Note (Signed)
Chronic Blood pressure well controlled Continue amlodipine 5 mg daily, lisinopril 10 mg daily

## 2021-09-15 NOTE — Assessment & Plan Note (Signed)
History of CVA Continue aspirin 81 mg daily, Plavix 75 mg daily Blood pressure reasonably controlled Refuses statin

## 2021-10-02 ENCOUNTER — Inpatient Hospital Stay: Payer: Medicare FFS

## 2021-10-02 ENCOUNTER — Other Ambulatory Visit: Payer: Self-pay

## 2021-10-02 ENCOUNTER — Inpatient Hospital Stay: Payer: Medicare FFS | Attending: Hematology | Admitting: Hematology

## 2021-10-02 ENCOUNTER — Telehealth: Payer: Self-pay

## 2021-10-02 ENCOUNTER — Encounter: Payer: Self-pay | Admitting: Hematology

## 2021-10-02 DIAGNOSIS — D649 Anemia, unspecified: Secondary | ICD-10-CM | POA: Insufficient documentation

## 2021-10-02 DIAGNOSIS — C50212 Malignant neoplasm of upper-inner quadrant of left female breast: Secondary | ICD-10-CM

## 2021-10-02 DIAGNOSIS — N183 Chronic kidney disease, stage 3 unspecified: Secondary | ICD-10-CM | POA: Diagnosis not present

## 2021-10-02 DIAGNOSIS — I129 Hypertensive chronic kidney disease with stage 1 through stage 4 chronic kidney disease, or unspecified chronic kidney disease: Secondary | ICD-10-CM | POA: Diagnosis not present

## 2021-10-02 DIAGNOSIS — Z170 Estrogen receptor positive status [ER+]: Secondary | ICD-10-CM | POA: Diagnosis not present

## 2021-10-02 DIAGNOSIS — D61818 Other pancytopenia: Secondary | ICD-10-CM | POA: Diagnosis not present

## 2021-10-02 DIAGNOSIS — C7951 Secondary malignant neoplasm of bone: Secondary | ICD-10-CM | POA: Diagnosis not present

## 2021-10-02 LAB — COMPREHENSIVE METABOLIC PANEL
ALT: 14 U/L (ref 0–44)
AST: 21 U/L (ref 15–41)
Albumin: 3.4 g/dL — ABNORMAL LOW (ref 3.5–5.0)
Alkaline Phosphatase: 152 U/L — ABNORMAL HIGH (ref 38–126)
Anion gap: 10 (ref 5–15)
BUN: 33 mg/dL — ABNORMAL HIGH (ref 8–23)
CO2: 22 mmol/L (ref 22–32)
Calcium: 9 mg/dL (ref 8.9–10.3)
Chloride: 110 mmol/L (ref 98–111)
Creatinine, Ser: 1.48 mg/dL — ABNORMAL HIGH (ref 0.44–1.00)
GFR, Estimated: 35 mL/min — ABNORMAL LOW (ref >60)
Glucose, Bld: 105 mg/dL — ABNORMAL HIGH (ref 70–99)
Potassium: 4.1 mmol/L (ref 3.5–5.1)
Sodium: 142 mmol/L (ref 135–145)
Total Bilirubin: 0.4 mg/dL (ref 0.3–1.2)
Total Protein: 6.8 g/dL (ref 6.5–8.1)

## 2021-10-02 LAB — CBC WITH DIFFERENTIAL/PLATELET
Abs Immature Granulocytes: 0 10*3/uL (ref 0.00–0.07)
Basophils Absolute: 0.1 10*3/uL (ref 0.0–0.1)
Basophils Relative: 2 %
Eosinophils Absolute: 0.1 10*3/uL (ref 0.0–0.5)
Eosinophils Relative: 3 %
HCT: 30.8 % — ABNORMAL LOW (ref 36.0–46.0)
Hemoglobin: 10.5 g/dL — ABNORMAL LOW (ref 12.0–15.0)
Immature Granulocytes: 0 %
Lymphocytes Relative: 29 %
Lymphs Abs: 0.7 10*3/uL (ref 0.7–4.0)
MCH: 38.6 pg — ABNORMAL HIGH (ref 26.0–34.0)
MCHC: 34.1 g/dL (ref 30.0–36.0)
MCV: 113.2 fL — ABNORMAL HIGH (ref 80.0–100.0)
Monocytes Absolute: 0.4 10*3/uL (ref 0.1–1.0)
Monocytes Relative: 14 %
Neutro Abs: 1.3 10*3/uL — ABNORMAL LOW (ref 1.7–7.7)
Neutrophils Relative %: 52 %
Platelets: 153 10*3/uL (ref 150–400)
RBC: 2.72 MIL/uL — ABNORMAL LOW (ref 3.87–5.11)
RDW: 13.3 % (ref 11.5–15.5)
WBC: 2.5 10*3/uL — ABNORMAL LOW (ref 4.0–10.5)
nRBC: 0 % (ref 0.0–0.2)

## 2021-10-02 MED ORDER — PALBOCICLIB 75 MG PO TABS: 75.0000 mg | ORAL_TABLET | Freq: Every day | ORAL | 3 refills | Status: DC

## 2021-10-02 NOTE — Progress Notes (Signed)
[place]   [phone] [phone]) [phone]   Clinic Follow up Note   Patient Care Team: Binnie Rail, MD as PCP - General (Internal Medicine) Garvin Fila, MD as Consulting Physician (Neurology) Rolm Bookbinder, MD as Consulting Physician (General Surgery) Truitt Merle, MD as Consulting Physician (Hematology) Kyung Rudd, MD as Consulting Physician (Radiation Oncology)  Date of Service:  10/02/2021  CHIEF COMPLAINT: f/u of metastatic breast cancer  CURRENT THERAPY:  -Zometa q77month starting 11/29/20. Given her CKD will only give when she has hypercalcemia and may switch to Prolia.  -First-line Anti-estrogen therapy Anastrozole 181mdaily starting end of 11/2020 + Ibrance 3 weeks on/1 weeks off starting 01/09/21. Reduced to 7539mtarting with C2 (02/13/21) due to cytopenia and fatigue. Reduced to 62m69mweeks on/1 week off starting C3 on 03/13/21.  ASSESSMENT & PLAN:  Shirley NEGASHa 80 y16. female with   1. 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 She was treated with left total mastectomy. She did not require radiation and declined antiestrogen therapy due to concerns of side effects. She lost follow up after 07/2017.   -After 6 months of left hip and ribcage pain she was found to have diffuse bone metastasis on 11/28/20 PET. This was confirmed on 12/09/20 bone biopsy which showed metastatic carcinoma compatible from prior breast cancer, ER 50%+, PR 15% positive and HER2 negative. -she started first-line Anastrozole in 11/2020 and Ibrance 100mg78meeks on/1 week off starting 01/09/21. Reduced to 62mg 69mting with C2 and reduced to 2 weeks on/1 week off starting with C3 on 03/13/21. She is tolerating better.  -Her PET from 03/31/21 showed dramatic reduction in metabolic activity  throughout the skeleton. Persistent diffuse confluent sclerotic skeletal metastasis unchanged. No evidence of soft tissue breast cancer metastasis. -She continues to tolerate treatment well with dose reduction. Labs reviewed, CBC continues to show some mild pancytopenia, CMP is overall stable. Most recent CA 27.29 on 07/17/21 was WNL, down to 21.4. Today's result is pending. -she was supposed to have a restaging PET this morning. For some reason, this was never scheduled. We will work on getting this scheduled, and I will call her with the results. -Continue Anastrozole daily and Ibrance 62mg  14my 2 weeks on/1 week off, she is tolerating well    2. Diffuse bone metastasis, L1/L3 fracture, hypercalcemia -She presented with significant left hip and rib cage pain for 6 months, lumbar MRI showed diffuse bone metastasis and subacute pathologic fracture of L1 and L3.  -Her 11/28/20 PET scan showed diffuse bone metastasis in pelvis, femur, spine, and ribs. No visceral metastasis. Her bone biopsy from 12/09/20 showed metastatic carcinoma compatible from prior breast cancer.  -She completed palliative RT with Dr Moody oLisbeth Renshaw2/22-1/26/22 for severe left hip pain. -She received one dose of Zometa on 11/29/20 for hypercalcinemia, but given her CKD this has been held. Her calcium has been WNL to low since Zometa injection. -She is not having significant back pain or tenderness from her lumbar fracture, Dr. Watts dPascal Luxot recommend osteocool or kyphoplasty at this point. -Her 03/31/21 PET showed dramatic improvement of her bone mets. Her pain has much improved, and she is no longer taking Norco.    3. Chronic comorbidities: Pre-DM, CKD stage III, history of CVA, HL, osteopenia -On amlodipine, clopidogrel, hydralazine,  aspirin, furosemide, lisinopril, -She has had osteoporosis as seen on 2016 and 08/2017 DEXA. Her 08/2020 DEXA improved to osteopenia.  -Continue to F/u with PCP Dr. Billey Gosling   4. Anemia, Pancytopenia   -Initially mild anemia in 08/2020. This is likely related to bone mets.  -Continue oral iron. Hgb 10.5 today (10/02/21) -She has pancytopenia from [place]. Improved with Ibrance dose reduction. No Thrombocytopenia recently     PLAN:  -Continue Anastrozole daily and Ibrance 57m, 2 weeks on/1 week off.  -will schedule PET scan ASAP, I will call her with the results -lab and f/u in 3 months   No problem-specific Assessment & Plan notes found for this encounter.   SUMMARY OF ONCOLOGIC HISTORY: Oncology History Overview Note  Cancer Staging Malignant neoplasm of upper-inner quadrant of left breast in female, estrogen receptor positive ([place] Staging form: Breast, AJCC 8th Edition - Clinical stage from 06/18/2017: Stage IB (cT2, cN0, cM0, G2, ER: Positive, PR: Positive, HER2: Negative) - Signed by FTruitt Merle MD on 06/24/2017 - Pathologic stage from 07/30/2017: Stage IA (pT2, pN0, cM0, G2, ER: Positive, PR: Positive, HER2: Negative, Oncotype DX score: 23) - Signed by FTruitt Merle MD on 08/23/2017     Malignant neoplasm of upper-inner quadrant of left breast in female, estrogen receptor positive ([facility]  06/17/2017 Mammogram   [place] [facility] diagnostic Breast Tomo Bilateral  IMPRESSION: 1. 3.4 palpable mass in the 10 o'clock position of the left breast is highly suspicious for malignancy.  2. No evidence of malignancy in the right breast.  3. Ultrasound of the left axilla shows normal sized axillary lymph nodes.   06/18/2017 Initial Biopsy   Diagnosis 06/18/17 Breast, left, needle core biopsy, 10:00 o'clock - INVASIVE MAMMARY CARCINOMA. At least G2  The malignant cells are negative for E-Cadherin, supporting a lobular phenotype.   06/18/2017 Initial Diagnosis   Malignant neoplasm of upper-inner quadrant of left breast in female, estrogen receptor positive ([facility]   06/18/2017 Receptors her2   ER 95%+, PR 25%+, both strong staining  HER2- Ki67 30%    07/30/2017 Surgery   LEFT TOTAL MASTECTOMY WITH  AXILLARY SENTINEL LYMPH NODE BIOPSY by Dr. WDonne Hazelon 07/30/17   07/30/2017 Pathology Results   Diagnosis 07/30/17 1. Breast, simple mastectomy, Left Total - INVASIVE LOBULAR CARCINOMA, GRADE 2, SPANNING 3.5 CM. - LOBULAR CARCINOMA IN SITU. - INVASIVE CARCINOMA IS BROADLY 0.1 CM FROM POSTERIOR MARGIN. - PERINEURAL INVASION PRESENT. - SEE ONCOLOGY TABLE. 2. Lymph node, sentinel, biopsy, Left Axillary - ONE OF ONE LYMPH NODES NEGATIVE FOR CARCINOMA (0/1). 3. Lymph node, sentinel, biopsy, Left - ONE OF ONE LYMPH NODES NEGATIVE FOR CARCINOMA (0/1).   07/30/2017 Oncotype testing   Oncotype 07/30/17 Recurrence score is 23, an intermediate risk With a 10-year risk of recurrence at 15% with tamoxifen alone.     11/11/2020 Imaging   MRI Lumbar Spine  IMPRESSION: Diffuse osseous metastases. Chronic inferior L2 endplate deformity with mild height loss.   Likely late subacute pathologic fracture deformities involving the superior L1 endplate and right L3 lamina.   Multilevel spondylosis. Moderate to severe spinal canal and neural foraminal narrowing at the L3-4 and L4-5 levels.   Mild to moderate spinal canal and neural foraminal narrowing at the L5-S1 level.   11/28/2020 PET scan   IMPRESSION: 1. Widespread and diffuse hypermetabolic bony metastases. 2. No evidence for soft tissue metastases in the neck, chest, abdomen, or pelvis. 3.  Aortic Atherosclerois (ICD10-170.0)     11/29/2020 -  Chemotherapy  Zometa q58month starting 11/29/20. Held after first dose. Given her CKD will only give when she has hypercalcemia and may switch to Prolia.     12/04/2020 Imaging   MRI Brain  IMPRESSION: No evidence of intracranial metastatic disease.   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   Mild chronic microvascular ischemic changes. Small chronic right cerebellar infarct.     12/07/2020 - 12/21/2020 Radiation Therapy   palliative RT to left hip with Dr MLisbeth Renshawon 12/07/20-12/21/20    12/09/2020 Pathology Results   FINAL MICROSCOPIC DIAGNOSIS:   A. BONE, BIOPSY:  - Metastatic carcinoma.  - See comment.   COMMENT:  The morphology is compatible with metastatic lobular breast carcinoma.  Estrogen receptor, progesterone receptor and HER2/neu will be performed.   ADDENDUM:   PROGNOSTIC INDICATOR RESULTS:   Immunohistochemical and morphometric analysis performed manually   The tumor cells are EQUIVOCAL for Her2 (2+).  Her2 by FISH will be performed and the results will be reported  separately.   Estrogen Receptor:       POSITIVE, 50%, MODERATE-STRONG STAINING  Progesterone Receptor:   POSITIVE, 15%, MODERATE-STRONG STAINING    11/2020 -  Anti-estrogen oral therapy   First-line Anti-estrogen therapy Anastrozole 149mdaily starting in 11/2020 and Ibrance starting in 12/2020    01/09/2021 -  Chemotherapy   Ibrance 3 weeks on/1 weeks off starting beginning of 01/09/21. Reduced to 7566mtarting with C2 (02/13/21) due to cytopenia and fatigue. Reduced to 53m62mweeks on/1 week off starting C3 on 03/13/21)    03/31/2021 PET scan   IMPRESSION: 1. Dramatic reduction in metabolic activity throughout the skeleton. Metabolic activity of diffuse sclerotic skeletal lesions is now essentially at background blood pool level. No focal metabolic activity. 2. Persistent diffuse confluent sclerotic skeletal metastasis unchanged. 3. No evidence of soft tissue breast cancer metastasis.      INTERVAL HISTORY:  RoseJANELLA ROGALAhere for a follow up of metastatic breast cancer. She was last seen by me on 07/17/21. She presents to the clinic accompanied by her daughter. She reports doing well overall. She notes no issues from either anastrozole or ibrance.   All other systems were reviewed with the patient and are negative.  MEDICAL HISTORY:  Past Medical History:  Diagnosis Date   CHICKENPOX, HX OF 03/21/2010   Qualifier: Diagnosis of  By: BranMarca Ancona, Lucy     CVA (cerebral infarction)  02/2010   no residual deficits, a/w mid basal art stenosis, declined IC stent as rec by IR/neuro   Dyslipidemia    intol of statin due to >3x increase LFTs   GERD (gastroesophageal reflux disease)    hx   Hypertension, essential    Malignant neoplasm of upper-inner quadrant of left breast in female, estrogen receptor positive ([facility])[place]   Osteoarthritis    [facility]  SURGICAL HISTORY: Past Surgical History:  Procedure Laterality Date   CHOLECYSTECTOMY  1981   MASTECTOMY W/ SENTINEL NODE BIOPSY Left 07/30/2017   Procedure: LEFT TOTAL MASTECTOMY WITH AXILLARY SENTINEL LYMPH NODE BIOPSY;  Surgeon: WakeRolm Bookbinder;  [facility]: [facility];  Laterality: Left;    I have reviewed the social history and family history with the patient and they are unchanged from previous note.  ALLERGIES:  is allergic to influenza vaccines, pneumococcal vaccines, simvastatin, tetanus toxoids, and zoster vaccine live.  MEDICATIONS:  Current Outpatient Medications  Medication Sig Dispense Refill   acetaminophen (TYLENOL) 650 MG CR tablet  Take 1,300 mg by mouth every 8 (eight) hours as needed for pain.     amLODipine (NORVASC) 5 MG tablet Take 1 tablet (5 mg total) by mouth daily. 90 tablet 3   anastrozole (ARIMIDEX) 1 MG tablet Take 1 tablet (1 mg total) by mouth daily. 90 tablet 3   aspirin 81 MG tablet Take 81 mg by mouth 2 (two) times a week.     clopidogrel (PLAVIX) 75 MG tablet TAKE 1 TABLET(75 MG) BY MOUTH DAILY 90 tablet 1   furosemide (LASIX) 20 MG tablet Take 20 mg by mouth daily as needed for fluid.     lisinopril (ZESTRIL) 10 MG tablet TAKE 1 TABLET(10 MG) BY MOUTH DAILY 90 tablet 0   loratadine (CLARITIN) 10 MG tablet Take 10 mg by mouth daily as needed for allergies.     Menthol, Topical Analgesic, (BIOFREEZE EX) Apply 1 application topically daily as needed (muscle pain).     Multiple Vitamin (MULTIVITAMIN) capsule Take 1 capsule by mouth daily.     ondansetron  (ZOFRAN) 8 MG tablet Take 1 tablet (8 mg total) by mouth every 8 (eight) hours as needed for nausea or vomiting. 30 tablet 3   palbociclib (IBRANCE) 75 MG tablet Take 1 tablet (75 mg total) by mouth daily. Take for 14 days on, 7 days off, repeat every 21 days. 14 tablet 3   prochlorperazine (COMPAZINE) 10 MG tablet Take 1 tablet (10 mg total) by mouth every 6 (six) hours as needed for nausea or vomiting. 30 tablet 1   No current facility-administered medications for this visit.    PHYSICAL EXAMINATION: ECOG PERFORMANCE STATUS: 1 - Symptomatic but completely ambulatory  Vitals:   10/02/21 1247  BP: (!) 156/57  Pulse: 70  Resp: 19  Temp: 98.3 F (36.8 C)  SpO2: 100%   Wt Readings from Last 3 Encounters:  10/02/21 138 lb 8 oz (62.8 kg)  09/15/21 136 lb (61.7 kg)  07/17/21 134 lb 6.4 oz (61 kg)     GENERAL:alert, no distress and comfortable SKIN: skin color, texture, turgor are normal, no rashes or significant lesions EYES: normal, Conjunctiva are pink and non-injected, sclera clear  NECK: supple, thyroid normal size, non-tender, without nodularity LYMPH:  no palpable lymphadenopathy in the cervical, axillary  LUNGS: clear to auscultation and percussion with normal breathing effort HEART: regular rate & rhythm and no murmurs and no lower extremity edema ABDOMEN:abdomen soft, non-tender and normal bowel sounds Musculoskeletal:no cyanosis of digits and no clubbing  NEURO: alert & oriented x 3 with fluent speech, no focal motor/sensory deficits BREAST: No palpable mass, nodules or adenopathy bilaterally. Breast exam benign.   LABORATORY DATA:  I have reviewed the data as listed CBC Latest Ref Rng & Units 10/02/2021 07/17/2021 05/15/2021  WBC 4.0 - 10.5 K/uL 2.5(L) 3.1(L) 2.9(L)  Hemoglobin 12.0 - 15.0 g/dL 10.5(L) 10.0(L) 10.6(L)  Hematocrit 36.0 - 46.0 % 30.8(L) 28.5(L) 31.6(L)  Platelets 150 - 400 K/uL 153 179 167     CMP Latest Ref Rng & Units 10/02/2021 07/17/2021 05/15/2021   Glucose 70 - 99 mg/dL 105(H) 110(H) 101(H)  BUN 8 - 23 mg/dL 33(H) 33(H) 41(H)  Creatinine 0.44 - 1.00 mg/dL 1.48(H) 1.57(H) 1.42(H)  Sodium 135 - 145 mmol/L 142 147(H) 142  Potassium 3.5 - 5.1 mmol/L 4.1 4.6 4.4  Chloride 98 - 111 mmol/L 110 113(H) 111  CO2 22 - 32 mmol/L 22 24 21(L)  Calcium 8.9 - 10.3 mg/dL 9.0 9.3 9.4  Total Protein 6.5 -  8.1 g/dL 6.8 6.7 7.1  Total Bilirubin 0.3 - 1.2 mg/dL 0.4 0.5 0.6  Alkaline Phos 38 - 126 U/L 152(H) 235(H) 238(H)  AST 15 - 41 U/L 21 38 25  ALT 0 - 44 U/L 14 42 19      RADIOGRAPHIC STUDIES: I have personally reviewed the radiological images as listed and agreed with the findings in the report. No results found.    No orders of the defined types were placed in this encounter.  All questions were answered. The patient knows to call the clinic with any problems, questions or concerns. No barriers to learning was detected. The total time spent in the appointment was 30 minutes.     Truitt Merle, MD 10/02/2021   I, Wilburn Mylar, am acting as scribe for Truitt Merle, MD.   I have reviewed the above documentation for accuracy and completeness, and I agree with the above.

## 2021-10-02 NOTE — Telephone Encounter (Signed)
Oral Oncology Patient Advocate Encounter  Met patient in [employer] to complete re-enrollment application for [facility] in an effort to reduce patient's out of pocket expense for Ibrance to $0.    Application completed and faxed to [phone].   [employer] patient assistance phone number for follow up is [phone].   This encounter will be updated until final determination.   [place] Patient [place] Phone [phone] Fax [phone] 10/02/2021 5:47 PM

## 2021-10-03 ENCOUNTER — Telehealth: Payer: Self-pay | Admitting: Hematology

## 2021-10-03 LAB — CANCER ANTIGEN 27.29: CA 27.29: 27.5 U/mL (ref 0.0–38.6)

## 2021-10-03 NOTE — Telephone Encounter (Signed)
Scheduled follow-up appointment per 11/7 los. Patient is aware.

## 2021-10-13 ENCOUNTER — Telehealth: Payer: Self-pay | Admitting: Internal Medicine

## 2021-10-13 ENCOUNTER — Ambulatory Visit (HOSPITAL_COMMUNITY)
Admission: RE | Admit: 2021-10-13 | Discharge: 2021-10-13 | Disposition: A | Discharge status: Home / Self Care | Payer: Medicare FFS | Source: Ambulatory Visit | Attending: Hematology | Admitting: Hematology

## 2021-10-13 DIAGNOSIS — Z170 Estrogen receptor positive status [ER+]: Secondary | ICD-10-CM | POA: Insufficient documentation

## 2021-10-13 DIAGNOSIS — C50212 Malignant neoplasm of upper-inner quadrant of left female breast: Secondary | ICD-10-CM

## 2021-10-13 LAB — GLUCOSE, CAPILLARY: Glucose-Capillary: 95 mg/dL (ref 70–99)

## 2021-10-13 MED ORDER — FLUDEOXYGLUCOSE F - 18 (FDG) INJECTION
6.9000 | Freq: Once | INTRAVENOUS | Status: AC | PRN
  Administered 2021-10-13: 6.9 via INTRAVENOUS

## 2021-10-13 NOTE — Telephone Encounter (Signed)
Spoke with patient today and urgent care recommended.

## 2021-10-13 NOTE — Telephone Encounter (Signed)
Patient states she has chest congestion, patient requesting rx for congestion  Offered patient an appt, patient declined  Patient requesting a call back to discuss symptom

## 2021-11-07 ENCOUNTER — Other Ambulatory Visit: Payer: Self-pay | Admitting: Internal Medicine

## 2021-11-28 ENCOUNTER — Telehealth: Payer: Self-pay | Admitting: Internal Medicine

## 2021-11-28 NOTE — Telephone Encounter (Signed)
Connected to [facility] 1.2.2023.  Caller states she has had a headache and sore throat since yesterday. States she can't hardly swallow, throat pain is 8-9/10 and headache pain is mild with Tylenol.  Advised to see PCP within 24 hours. No appt available. Please advise as to where to schedule

## 2021-11-30 ENCOUNTER — Encounter: Payer: Self-pay | Admitting: Nurse Practitioner

## 2021-11-30 ENCOUNTER — Other Ambulatory Visit: Payer: Self-pay

## 2021-11-30 ENCOUNTER — Telehealth (INDEPENDENT_AMBULATORY_CARE_PROVIDER_SITE_OTHER): Payer: Medicare FFS | Admitting: Nurse Practitioner

## 2021-11-30 DIAGNOSIS — R051 Acute cough: Secondary | ICD-10-CM

## 2021-11-30 MED ORDER — AZITHROMYCIN 250 MG PO TABS: ORAL_TABLET | ORAL | 0 refills | Status: AC

## 2021-11-30 NOTE — Progress Notes (Signed)
Due to national recommendations of social distancing related to the [place] pandemic, an audio-only tele-health visit was felt to be the most appropriate encounter type for this patient today. I connected with  Shirley Wells on 11/30/21 utilizing audio-only technology and verified that I am speaking with the correct person using two identifiers. The patient was located at their home, and I was located at the office of [place] at [facility] during the encounter. I discussed the limitations of evaluation and management by telemedicine. The patient expressed understanding and agreed to proceed.    Subjective:  Patient ID: Shirley Wells, female    DOB: 1941/10/06  Age: 81 y.o. MRN: [id number]  CC:  Chief Complaint  Patient presents with   Cough      HPI  This patient arrives today for a virtual visit for the above.  Symptoms started about 1 week ago.  She is experiencing productive cough, headache, chills, and sore throat.  She tells me that she went to urgent care earlier this week and she was found to be negative for flu and negative for COVID.  She was given prednisone and cough syrup which she is taking and tells me her symptoms are improved slightly but she is concerned she is starting to develop pneumonia.  She is also been taking Tylenol as needed without much improvement in her symptoms.   Past Medical History:  Diagnosis Date   CHICKENPOX, HX OF 03/21/2010   Qualifier: Diagnosis of  By: Marca Ancona RMA, Lucy     CVA (cerebral infarction) 02/2010   no residual deficits, a/w mid basal art stenosis, declined IC stent as rec by IR/neuro   Dyslipidemia    intol of statin due to >3x increase LFTs   GERD (gastroesophageal reflux disease)    hx   Hypertension, essential    Malignant neoplasm of upper-inner quadrant of left breast in female, estrogen receptor positive ([place]) 06/20/2017   Osteoarthritis    Stroke [facility])       Family History  Problem Relation Age of Onset    Breast cancer Mother 62   Arthritis Other    Breast cancer Maternal Aunt     Social History   Social History Narrative   Divorced  >40 yrs, single and lives alone.    Supportive daughters, 1 is a Therapist, sports at [employer] [place]).    Patient enjoys gardening, dancing and caring for her grand children.   Social History   Tobacco Use   Smoking status: Never   Smokeless tobacco: Never  Substance Use Topics   Alcohol use: No    Alcohol/week: 0.0 standard drinks     Current Meds  Medication Sig   acetaminophen (TYLENOL) 650 MG CR tablet Take 1,300 mg by mouth every 8 (eight) hours as needed for pain.   amLODipine (NORVASC) 5 MG tablet Take 1 tablet (5 mg total) by mouth daily.   anastrozole (ARIMIDEX) 1 MG tablet Take 1 tablet (1 mg total) by mouth daily.   aspirin 81 MG tablet Take 81 mg by mouth 2 (two) times a week.   clopidogrel (PLAVIX) 75 MG tablet TAKE 1 TABLET(75 MG) BY MOUTH DAILY   furosemide (LASIX) 20 MG tablet Take 20 mg by mouth daily as needed for fluid.   lisinopril (ZESTRIL) 10 MG tablet TAKE 1 TABLET(10 MG) BY MOUTH DAILY   loratadine (CLARITIN) 10 MG tablet Take 10 mg by mouth daily as needed for allergies.   Menthol, Topical  Analgesic, (BIOFREEZE EX) Apply 1 application topically daily as needed (muscle pain).   Multiple Vitamin (MULTIVITAMIN) capsule Take 1 capsule by mouth daily.   palbociclib (IBRANCE) 75 MG tablet Take 1 tablet (75 mg total) by mouth daily. Take for 14 days on, 7 days off, repeat every 21 days.    ROS:  Review of Systems  Constitutional:  Positive for chills. Negative for fever.  HENT:  Positive for sore throat.   Respiratory:  Positive for cough and sputum production. Negative for shortness of breath and wheezing.   Cardiovascular:  Negative for chest pain.  Neurological:  Positive for headaches.    Objective:   Today's Vitals: There were no vitals taken for this visit. Vitals with BMI 10/02/2021 09/15/2021 07/17/2021  Height 5\' 2"   5\' 2"  5\' 2"   Weight 138 lbs 8 oz 136 lbs 134 lbs 6 oz  BMI 25.33 35.00 93.81  Systolic 829 937 169  Diastolic 57 80 55  Pulse 70 56 56     Physical Exam Comprehensive physical exam not completed today as office visit was conducted remotely.  Patient sounded fairly well over the phone, she was able to speak in complete sentences without having to stop to breathe.  She did cough once over the phone.  Patient was alert and oriented, and appeared to have appropriate judgment.       Assessment and Plan   1. Acute cough      Plan: 1.  We will treat her with azithromycin as she has taken this in the past and is tolerated it well, and I do not see that it is known to interact with current medication she is on.  She was told if symptoms persist or worsen over the next few days that she should call this office back at which point I would recommend she be seen in person for further evaluation.  She tells me she understands.  She is also encouraged to continue taking her prednisone and cough syrup as needed.  She is also encouraged to Tylenol as needed as well.   Tests ordered No orders of the defined types were placed in this encounter.     No orders of the defined types were placed in this encounter.   Patient to follow-up as scheduled or sooner as needed.  Total time spent on telephone today was 10 minutes and 50 seconds.  Ailene Ards, NP

## 2021-11-30 NOTE — Telephone Encounter (Signed)
Scheduled for today.

## 2021-11-30 NOTE — Telephone Encounter (Signed)
Yes do VV since I think we are full tomorrow - that would probably be easier for her

## 2021-11-30 NOTE — Telephone Encounter (Signed)
Pt daughter Shirley Wells is calling stating that she took the pt Shirley Wells to [facility] in [place], [place].   Pt was Dx with bronchitis laryngitis and was given Prednisone and a cough syrup for it. Pt Daughter states that it is not getting better and was wondering if she needed an antibiotic to help with feeling better  Shirley Wells number [phone]. Please call Shirley Wells with further recommendations.

## 2021-12-05 ENCOUNTER — Telehealth: Payer: Self-pay | Admitting: Internal Medicine

## 2021-12-05 NOTE — Telephone Encounter (Signed)
Patient calling in  Says she believes she needs another round of the antibiotic azithromycin (ZITHROMAX) 250 MG tablet bc she has not cleared up completely from previous antibiotic & she is still somewhat congested   [place], [place] Phone:  [phone]  Fax:  [phone]

## 2021-12-07 NOTE — Telephone Encounter (Signed)
What are her current symptoms?  Is it mostly just congestion?  She can take Mucinex for that.  It is not uncommon to have lingering congestion and drainage in the back of the throat after completing antibiotics that can last a while.  Ideally we do want to do too many antibiotics and increase the risk of diarrhea.

## 2021-12-07 NOTE — Telephone Encounter (Signed)
Spoke with patient today. She mostly has congestion and states her mucus is no longer dark brown/green. It is more on the clear side now. Encouraged patient to take Mucinex as needed and to continue to drink plenty of fluids.

## 2022-01-01 ENCOUNTER — Inpatient Hospital Stay: Payer: Medicare FFS | Attending: Hematology | Admitting: Hematology

## 2022-01-01 ENCOUNTER — Other Ambulatory Visit: Payer: Self-pay

## 2022-01-01 ENCOUNTER — Encounter: Payer: Self-pay | Admitting: Hematology

## 2022-01-01 ENCOUNTER — Inpatient Hospital Stay: Payer: Medicare FFS

## 2022-01-01 VITALS — BP 151/64 | HR 62 | Temp 97.8°F | Resp 20 | Ht 62.0 in | Wt 133.0 lb

## 2022-01-01 DIAGNOSIS — C50212 Malignant neoplasm of upper-inner quadrant of left female breast: Secondary | ICD-10-CM | POA: Diagnosis not present

## 2022-01-01 DIAGNOSIS — D61818 Other pancytopenia: Secondary | ICD-10-CM | POA: Insufficient documentation

## 2022-01-01 DIAGNOSIS — D649 Anemia, unspecified: Secondary | ICD-10-CM | POA: Diagnosis not present

## 2022-01-01 DIAGNOSIS — M8580 Other specified disorders of bone density and structure, unspecified site: Secondary | ICD-10-CM | POA: Insufficient documentation

## 2022-01-01 DIAGNOSIS — N189 Chronic kidney disease, unspecified: Secondary | ICD-10-CM | POA: Insufficient documentation

## 2022-01-01 DIAGNOSIS — I129 Hypertensive chronic kidney disease with stage 1 through stage 4 chronic kidney disease, or unspecified chronic kidney disease: Secondary | ICD-10-CM | POA: Insufficient documentation

## 2022-01-01 DIAGNOSIS — C7951 Secondary malignant neoplasm of bone: Secondary | ICD-10-CM | POA: Diagnosis not present

## 2022-01-01 DIAGNOSIS — Z170 Estrogen receptor positive status [ER+]: Secondary | ICD-10-CM

## 2022-01-01 LAB — CBC WITH DIFFERENTIAL/PLATELET
Abs Immature Granulocytes: 0.01 10*3/uL (ref 0.00–0.07)
Basophils Absolute: 0 10*3/uL (ref 0.0–0.1)
Basophils Relative: 2 %
Eosinophils Absolute: 0.1 10*3/uL (ref 0.0–0.5)
Eosinophils Relative: 2 %
HCT: 27.7 % — ABNORMAL LOW (ref 36.0–46.0)
Hemoglobin: 9.1 g/dL — ABNORMAL LOW (ref 12.0–15.0)
Immature Granulocytes: 0 %
Lymphocytes Relative: 33 %
Lymphs Abs: 0.7 10*3/uL (ref 0.7–4.0)
MCH: 39.1 pg — ABNORMAL HIGH (ref 26.0–34.0)
MCHC: 32.9 g/dL (ref 30.0–36.0)
MCV: 118.9 fL — ABNORMAL HIGH (ref 80.0–100.0)
Monocytes Absolute: 0.2 10*3/uL (ref 0.1–1.0)
Monocytes Relative: 9 %
Neutro Abs: 1.2 10*3/uL — ABNORMAL LOW (ref 1.7–7.7)
Neutrophils Relative %: 54 %
Platelets: 146 10*3/uL — ABNORMAL LOW (ref 150–400)
RBC: 2.33 MIL/uL — ABNORMAL LOW (ref 3.87–5.11)
RDW: 17.2 % — ABNORMAL HIGH (ref 11.5–15.5)
Smear Review: NORMAL
WBC: 2.2 10*3/uL — ABNORMAL LOW (ref 4.0–10.5)
nRBC: 0 % (ref 0.0–0.2)

## 2022-01-01 LAB — COMPREHENSIVE METABOLIC PANEL
ALT: 12 U/L (ref 0–44)
AST: 20 U/L (ref 15–41)
Albumin: 3.5 g/dL (ref 3.5–5.0)
Alkaline Phosphatase: 164 U/L — ABNORMAL HIGH (ref 38–126)
Anion gap: 10 (ref 5–15)
BUN: 34 mg/dL — ABNORMAL HIGH (ref 8–23)
CO2: 23 mmol/L (ref 22–32)
Calcium: 9.2 mg/dL (ref 8.9–10.3)
Chloride: 108 mmol/L (ref 98–111)
Creatinine, Ser: 1.61 mg/dL — ABNORMAL HIGH (ref 0.44–1.00)
GFR, Estimated: 32 mL/min — ABNORMAL LOW (ref >60)
Glucose, Bld: 91 mg/dL (ref 70–99)
Potassium: 4.2 mmol/L (ref 3.5–5.1)
Sodium: 141 mmol/L (ref 135–145)
Total Bilirubin: 0.3 mg/dL (ref 0.3–1.2)
Total Protein: 7.2 g/dL (ref 6.5–8.1)

## 2022-01-01 MED ORDER — PALBOCICLIB 75 MG PO TABS: 75.0000 mg | ORAL_TABLET | Freq: Every day | ORAL | 3 refills | Status: DC

## 2022-01-01 NOTE — Progress Notes (Signed)
[place]   Telephone:([phone]) [phone] Fax:(336) [phone]   Clinic Follow up Note   Patient Care Team: Binnie Rail, MD as PCP - General (Internal Medicine) Garvin Fila, MD as Consulting Physician (Neurology) Rolm Bookbinder, MD as Consulting Physician (General Surgery) Truitt Merle, MD as Consulting Physician (Hematology) Kyung Rudd, MD as Consulting Physician (Radiation Oncology)  Date of Service:  01/01/2022  CHIEF COMPLAINT: f/u of metastatic breast cancer  CURRENT THERAPY:  -Zometa q73month starting 11/29/20. Given her CKD will only give when she has hypercalcemia and may switch to Prolia.  -First-line Anti-estrogen therapy Anastrozole 124mdaily starting end of 11/2020 + Ibrance 3 weeks on/1 weeks off starting 01/09/21. Reduced to 7539mtarting with C2 (02/13/21) due to cytopenia and fatigue. Reduced to 70m57mweeks on/1 week off starting C3 on 03/13/21.  ASSESSMENT & PLAN:  Shirley Wells 80 y26. female with   1. 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 S/p left total mastectomy. She declined antiestrogen therapy due to concerns of side effects. She lost follow up after 07/2017.   -After 6 months of left hip and ribcage pain she was found to have diffuse bone metastasis on 11/28/20 PET. This was confirmed on 12/09/20 bone biopsy which showed metastatic carcinoma compatible from prior breast cancer, ER 50%+, PR 15% positive and HER2 negative. -she started first-line Anastrozole in 11/2020 and Ibrance 100mg72meeks on/1 week off starting 01/09/21. Reduced to 70mg 68mting with C2 and reduced to 2 weeks on/1 week off starting with C3 on 03/13/21. She is tolerating better.  -most recent PET from 10/13/21 showed widespread osseous metastatic disease again noted with areas of minimally increased  metabolic activity within sternal manubrium and L2 vertebral body. -She continues to tolerate treatment well with dose reduction. Labs reviewed, CBC continues to show some mild pancytopenia, CMP is overall stable. Most recent CA 27.29 on 10/02/21 was WNL, stable at 37.5. Today's result is pending. -Continue Anastrozole daily and Ibrance 70mg  52my 2 weeks on/1 week off, she is tolerating well    2. Diffuse bone metastasis, L1/L3 fracture, hypercalcemia -S/p palliative RT with Dr Moody 1Lisbeth Renshaw/26/22 for severe left hip pain. -She received one dose of Zometa on 11/29/20 for hypercalcinemia, but given her CKD this has been held. Her calcium has been WNL to low since Zometa injection. -PET on 10/13/21 showed overall stable osseous metastatic disease compared to 03/2021. -she does not have much, if any, pain.   3. Chronic comorbidities: Pre-DM, CKD stage III, history of CVA, HL, osteopenia -On amlodipine, clopidogrel, hydralazine, aspirin, furosemide, lisinopril -She has had osteoporosis as seen on 2016 and 08/2017 DEXA. Her 08/2020 DEXA improved to osteopenia.  -Continue to F/u with PCP Dr. Stacy BBilley Goslingnemia, Pancytopenia  -Initially mild anemia in 08/2020. This is likely related to bone mets.  -Continue oral iron. Hgb 9.1 today (01/01/22) -She has pancytopenia from [place] today.  5. Possible COVID in 11/2021 -she had URI symptoms and COVID exposure but home test was negative -she had slow recovery, but feels much better now, still has dry cough      PLAN:  -Continue Anastrozole daily and Ibrance 70mg, 218mks on/1 week off. I refilled today  -lab and PET scan in 3 months -virtual visit a few days after PET  No problem-specific Assessment & Plan notes found for this encounter.   SUMMARY OF ONCOLOGIC HISTORY: Oncology History Overview Note   Cancer Staging  Malignant neoplasm of upper-inner quadrant of left breast in female, estrogen receptor positive ([place]) Staging form:  Breast, AJCC 8th Edition - Clinical stage from 06/18/2017: Stage IB (cT2, cN0, cM0, G2, ER+, PR+, HER2-) - Signed by Truitt Merle, MD on 06/24/2017 Histologic grading system: 3 grade system - Pathologic stage from 07/30/2017: Stage IA (pT2, pN0, cM0, G2, ER+, PR+, HER2-, Oncotype DX score: 23) - Signed by Truitt Merle, MD on 08/23/2017 Neoadjuvant therapy: No Nuclear grade: G2 Multigene prognostic tests performed: Oncotype DX Recurrence score range: Greater than or equal to 11 Histologic grading system: 3 grade system Residual tumor (R): R0 - None Laterality: Left     Malignant neoplasm of upper-inner quadrant of left breast in female, estrogen receptor positive ([place])  06/17/2017 Mammogram   [place] and [facility] diagnostic Breast Tomo Bilateral  IMPRESSION: 1. 3.4 palpable mass in the 10 o'clock position of the left breast is highly suspicious for malignancy.  2. No evidence of malignancy in the right breast.  3. Ultrasound of the left axilla shows normal sized axillary lymph nodes.   06/18/2017 Initial Biopsy   Diagnosis 06/18/17 Breast, left, needle core biopsy, 10:00 o'clock - INVASIVE MAMMARY CARCINOMA. At least G2  The malignant cells are negative for E-Cadherin, supporting a lobular phenotype.   06/18/2017 Initial Diagnosis   Malignant neoplasm of upper-inner quadrant of left breast in female, estrogen receptor positive ([place])   06/18/2017 Receptors her2   ER 95%+, PR 25%+, both strong staining  HER2- Ki67 30%    07/30/2017 Surgery   LEFT TOTAL MASTECTOMY WITH AXILLARY SENTINEL LYMPH NODE BIOPSY by Dr. Donne Hazel on 07/30/17   07/30/2017 Pathology Results   Diagnosis 07/30/17 1. Breast, simple mastectomy, Left Total - INVASIVE LOBULAR CARCINOMA, GRADE 2, SPANNING 3.5 CM. - LOBULAR CARCINOMA IN SITU. - INVASIVE CARCINOMA IS BROADLY 0.1 CM FROM POSTERIOR MARGIN. - PERINEURAL INVASION PRESENT. - SEE ONCOLOGY TABLE. 2. Lymph node, sentinel, biopsy, Left Axillary - ONE OF ONE LYMPH NODES NEGATIVE FOR  CARCINOMA (0/1). 3. Lymph node, sentinel, biopsy, Left - ONE OF ONE LYMPH NODES NEGATIVE FOR CARCINOMA (0/1).   07/30/2017 Oncotype testing   Oncotype 07/30/17 Recurrence score is 23, an intermediate risk With a 10-year risk of recurrence at 15% with tamoxifen alone.     11/11/2020 Imaging   MRI Lumbar Spine  IMPRESSION: Diffuse osseous metastases. Chronic inferior L2 endplate deformity with mild height loss.   Likely late subacute pathologic fracture deformities involving the superior L1 endplate and right L3 lamina.   Multilevel spondylosis. Moderate to severe spinal canal and neural foraminal narrowing at the L3-4 and L4-5 levels.   Mild to moderate spinal canal and neural foraminal narrowing at the L5-S1 level.   11/28/2020 PET scan   IMPRESSION: 1. Widespread and diffuse hypermetabolic bony metastases. 2. No evidence for soft tissue metastases in the neck, chest, abdomen, or pelvis. 3.  Aortic Atherosclerois (ICD10-170.0)     11/29/2020 -  Chemotherapy   Zometa q30month starting 11/29/20. Held after first dose. Given her CKD will only give when she has hypercalcemia and may switch to Prolia.     12/04/2020 Imaging   MRI Brain  IMPRESSION: No evidence of intracranial metastatic disease.   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   Mild chronic microvascular ischemic changes. Small chronic right cerebellar infarct.     12/07/2020 -  12/21/2020 Radiation Therapy   palliative RT to left hip with Dr Lisbeth Renshaw on 12/07/20-12/21/20   12/09/2020 Pathology Results   FINAL MICROSCOPIC DIAGNOSIS:   A. BONE, BIOPSY:  - Metastatic carcinoma.  - See comment.   COMMENT:  The morphology is compatible with metastatic lobular breast carcinoma.  Estrogen receptor, progesterone receptor and HER2/neu will be performed.   ADDENDUM:   PROGNOSTIC INDICATOR RESULTS:   Immunohistochemical and morphometric analysis performed manually   The tumor cells are EQUIVOCAL for Her2  (2+).  Her2 by FISH will be performed and the results will be reported  separately.   Estrogen Receptor:       POSITIVE, 50%, MODERATE-STRONG STAINING  Progesterone Receptor:   POSITIVE, 15%, MODERATE-STRONG STAINING    11/2020 -  Anti-estrogen oral therapy   First-line Anti-estrogen therapy Anastrozole 44m daily starting in 11/2020 and Ibrance starting in 12/2020    01/09/2021 -  Chemotherapy   Ibrance 3 weeks on/1 weeks off starting beginning of 01/09/21. Reduced to 725mstarting with C2 (02/13/21) due to cytopenia and fatigue. Reduced to 7564m weeks on/1 week off starting C3 on 03/13/21)    03/31/2021 PET scan   IMPRESSION: 1. Dramatic reduction in metabolic activity throughout the skeleton. Metabolic activity of diffuse sclerotic skeletal lesions is now essentially at background blood pool level. No focal metabolic activity. 2. Persistent diffuse confluent sclerotic skeletal metastasis unchanged. 3. No evidence of soft tissue breast cancer metastasis.   10/13/2021 PET scan   IMPRESSION: 1. Widespread osseous metastatic disease again noted with areas of minimally increased metabolic activity within the sternal manubrium and L2 vertebral body. 2. No evidence of extra osseous metastatic disease.      INTERVAL HISTORY:  RosANORA SCHWENKE here for a follow up of metastatic breast cancer. She was last seen by me on 10/02/21. She presents to the clinic accompanied by her daughter. Her daughter reports she likely had Covid at the beginning of the year. She did a home test initially that was negative, but they never retested to confirm. She reports pain in her right buttock area and notes she had shingles in this area within the last year.   All other systems were reviewed with the patient and are negative.  MEDICAL HISTORY:  Past Medical History:  Diagnosis Date   CHICKENPOX, HX OF 03/21/2010   Qualifier: Diagnosis of  By: BraMarca AnconaA, Lucy     CVA (cerebral infarction) 02/2010   no  residual deficits, a/w mid basal art stenosis, declined IC stent as rec by IR/neuro   Dyslipidemia    intol of statin due to >3x increase LFTs   GERD (gastroesophageal reflux disease)    hx   Hypertension, essential    Malignant neoplasm of upper-inner quadrant of left breast in [place], [place] positive [place] [facility]   Osteoarthritis    [facility]   SURGICAL HISTORY: Past Surgical History:  Procedure Laterality Date   CHOLECYSTECTOMY  1981   MASTECTOMY W/ SENTINEL NODE BIOPSY Left 07/30/2017   Procedure: LEFT TOTAL MASTECTOMY WITH AXILLARY SENTINEL LYMPH NODE BIOPSY;  Surgeon: WakRolm BookbinderD;  [facility]  Laterality: Left;    I have reviewed the social history and family history with the patient and they are unchanged from previous note.  ALLERGIES:  is allergic to influenza vaccines, pneumococcal vaccines, simvastatin, tetanus toxoids, and zoster vaccine live.  MEDICATIONS:  Current Outpatient Medications  Medication Sig Dispense Refill   acetaminophen (TYLENOL) 650  MG CR tablet Take 1,300 mg by mouth every 8 (eight) hours as needed for pain.     amLODipine (NORVASC) 5 MG tablet Take 1 tablet (5 mg total) by mouth daily. 90 tablet 3   anastrozole (ARIMIDEX) 1 MG tablet Take 1 tablet (1 mg total) by mouth daily. 90 tablet 3   aspirin 81 MG tablet Take 81 mg by mouth 2 (two) times a week.     clopidogrel (PLAVIX) 75 MG tablet TAKE 1 TABLET(75 MG) BY MOUTH DAILY 90 tablet 1   furosemide (LASIX) 20 MG tablet Take 20 mg by mouth daily as needed for fluid.     lisinopril (ZESTRIL) 10 MG tablet TAKE 1 TABLET(10 MG) BY MOUTH DAILY 90 tablet 0   loratadine (CLARITIN) 10 MG tablet Take 10 mg by mouth daily as needed for allergies.     Menthol, Topical Analgesic, (BIOFREEZE EX) Apply 1 application topically daily as needed (muscle pain).     Multiple Vitamin (MULTIVITAMIN) capsule Take 1 capsule by mouth daily.     ondansetron (ZOFRAN) 8 MG  tablet Take 1 tablet (8 mg total) by mouth every 8 (eight) hours as needed for nausea or vomiting. (Patient not taking: Reported on 11/30/2021) 30 tablet 3   palbociclib (IBRANCE) 75 MG tablet Take 1 tablet (75 mg total) by mouth daily. Take for 14 days on, 7 days off, repeat every 21 days. 14 tablet 3   prochlorperazine (COMPAZINE) 10 MG tablet Take 1 tablet (10 mg total) by mouth every 6 (six) hours as needed for nausea or vomiting. (Patient not taking: Reported on 11/30/2021) 30 tablet 1   No current facility-administered medications for this visit.    PHYSICAL EXAMINATION: ECOG PERFORMANCE STATUS: 2 - Symptomatic, <50% confined to bed  Vitals:   01/01/22 1244  BP: (!) 151/64  Pulse: 62  Resp: 20  Temp: 97.8 F (36.6 C)  SpO2: 98%   Wt Readings from Last 3 Encounters:  01/01/22 133 lb (60.3 kg)  10/02/21 138 lb 8 oz (62.8 kg)  09/15/21 136 lb (61.7 kg)     GENERAL:alert, no distress and comfortable SKIN: skin color, texture, turgor are normal, no rashes or significant lesions EYES: normal, Conjunctiva are pink and non-injected, sclera clear  NECK: supple, thyroid normal size, non-tender, without nodularity LYMPH:  no palpable lymphadenopathy in the cervical, axillary  LUNGS: clear to auscultation and percussion with normal breathing effort HEART: regular rate & rhythm and no murmurs and no lower extremity edema ABDOMEN:abdomen soft, non-tender and normal bowel sounds Musculoskeletal:no cyanosis of digits and no clubbing  NEURO: alert & oriented x 3 with fluent speech, no focal motor/sensory deficits BREAST: No palpable mass, nodules or adenopathy bilaterally. Breast exam benign.   LABORATORY DATA:  I have reviewed the data as listed CBC Latest Ref Rng & Units 01/01/2022 10/02/2021 07/17/2021  WBC 4.0 - 10.5 K/uL 2.2(L) 2.5(L) 3.1(L)  Hemoglobin 12.0 - 15.0 g/dL 9.1(L) 10.5(L) 10.0(L)  Hematocrit 36.0 - 46.0 % 27.7(L) 30.8(L) 28.5(L)  Platelets 150 - 400 K/uL 146(L) 153 179      CMP Latest Ref Rng & Units 01/01/2022 10/02/2021 07/17/2021  Glucose 70 - 99 mg/dL 91 105(H) 110(H)  BUN 8 - 23 mg/dL 34(H) 33(H) 33(H)  Creatinine 0.44 - 1.00 mg/dL 1.61(H) 1.48(H) 1.57(H)  Sodium 135 - 145 mmol/L 141 142 147(H)  Potassium 3.5 - 5.1 mmol/L 4.2 4.1 4.6  Chloride 98 - 111 mmol/L 108 110 113(H)  CO2 22 - 32 mmol/L 23 22 24  Calcium 8.9 - 10.3 mg/dL 9.2 9.0 9.3  Total Protein 6.5 - 8.1 g/dL 7.2 6.8 6.7  Total Bilirubin 0.3 - 1.2 mg/dL 0.3 0.4 0.5  Alkaline Phos 38 - 126 U/L 164(H) 152(H) 235(H)  AST 15 - 41 U/L 20 21 38  ALT 0 - 44 U/L 12 14 42      RADIOGRAPHIC STUDIES: I have personally reviewed the radiological images as listed and agreed with the findings in the report. No results found.    Orders Placed This Encounter  Procedures   NM PET Image Restage (PS) Skull Base to Thigh (F-18 FDG)    Standing Status:   Future    Standing Expiration Date:   01/01/2023    Order Specific Question:   If indicated for the ordered procedure, I authorize the administration of a radiopharmaceutical per Radiology protocol    Answer:   Yes    Order Specific Question:   Preferred imaging location?    Answer:   [facility]    Order Specific Question:   Radiology Contrast Protocol - do NOT remove file path    Answer:   [email or web address]   All questions were answered. The patient knows to call the clinic with any problems, questions or concerns. No barriers to learning was detected. The total time spent in the appointment was 30 minutes.     Truitt Merle, MD 01/01/2022   I, Wilburn Mylar, am acting as scribe for Truitt Merle, MD.   I have reviewed the above documentation for accuracy and completeness, and I agree with the above.

## 2022-01-02 LAB — CANCER ANTIGEN 27.29: CA 27.29: 50 U/mL — ABNORMAL HIGH (ref 0.0–38.6)

## 2022-01-05 NOTE — Telephone Encounter (Signed)
Patient is approved for Ibrance at no cost from [employer] 12/26/21-11/25/22  [place] uses [place] Patient [place] Phone [phone] Fax [phone] 01/05/2022 8:27 AM

## 2022-01-09 ENCOUNTER — Other Ambulatory Visit: Payer: Self-pay

## 2022-01-09 DIAGNOSIS — Z170 Estrogen receptor positive status [ER+]: Secondary | ICD-10-CM

## 2022-01-15 ENCOUNTER — Ambulatory Visit (INDEPENDENT_AMBULATORY_CARE_PROVIDER_SITE_OTHER): Payer: Medicare FFS

## 2022-01-15 DIAGNOSIS — Z0000 Encounter for general adult medical examination without abnormal findings: Secondary | ICD-10-CM | POA: Diagnosis not present

## 2022-01-15 NOTE — Progress Notes (Signed)
I connected with Shirley Wells today by telephone and verified that I am speaking with the correct person using two identifiers. Location patient: home Location provider: work Persons participating in the virtual visit: patient, provider.   I discussed the limitations, risks, security and privacy concerns of performing an evaluation and management service by telephone and the availability of in person appointments. I also discussed with the patient that there may be a patient responsible charge related to this service. The patient expressed understanding and verbally consented to this telephonic visit.    Interactive audio and video telecommunications were attempted between this provider and patient, however failed, due to patient having technical difficulties OR patient did not have access to video capability.  We continued and completed visit with audio only.  Some vital signs may be absent or patient reported.   Time Spent with patient on telephone encounter: 40 minutes  Subjective:   Shirley Wells is a 81 y.o. female who presents for Medicare Annual (Subsequent) preventive examination.  Review of Systems     Cardiac Risk Factors include: advanced age (>51men, >64 women);dyslipidemia;hypertension     Objective:    There were no vitals filed for this visit. There is no height or weight on file to calculate BMI.  Advanced Directives 01/15/2022 01/15/2021 12/09/2020 12/01/2020 07/31/2017 07/22/2017 06/26/2017  Does Patient Have a Medical Advance Directive? No No No No No No No  Would patient like information on creating a medical advance directive? No - Patient declined Yes (ED - Information included in AVS) No - Patient declined No - Patient declined No - Patient declined No - Patient declined No - Patient declined    Current Medications (verified) Outpatient Encounter Medications as of 01/15/2022  Medication Sig   acetaminophen (TYLENOL) 650 MG CR tablet Take 1,300 mg by mouth every 8 (eight)  hours as needed for pain.   amLODipine (NORVASC) 5 MG tablet Take 1 tablet (5 mg total) by mouth daily.   anastrozole (ARIMIDEX) 1 MG tablet Take 1 tablet (1 mg total) by mouth daily.   aspirin 81 MG tablet Take 81 mg by mouth 2 (two) times a week.   clopidogrel (PLAVIX) 75 MG tablet TAKE 1 TABLET(75 MG) BY MOUTH DAILY   furosemide (LASIX) 20 MG tablet Take 20 mg by mouth daily as needed for fluid.   lisinopril (ZESTRIL) 10 MG tablet TAKE 1 TABLET(10 MG) BY MOUTH DAILY   loratadine (CLARITIN) 10 MG tablet Take 10 mg by mouth daily as needed for allergies.   Menthol, Topical Analgesic, (BIOFREEZE EX) Apply 1 application topically daily as needed (muscle pain).   Multiple Vitamin (MULTIVITAMIN) capsule Take 1 capsule by mouth daily.   ondansetron (ZOFRAN) 8 MG tablet Take 1 tablet (8 mg total) by mouth every 8 (eight) hours as needed for nausea or vomiting. (Patient not taking: Reported on 11/30/2021)   palbociclib (IBRANCE) 75 MG tablet Take 1 tablet (75 mg total) by mouth daily. Take for 14 days on, 7 days off, repeat every 21 days.   prochlorperazine (COMPAZINE) 10 MG tablet Take 1 tablet (10 mg total) by mouth every 6 (six) hours as needed for nausea or vomiting. (Patient not taking: Reported on 11/30/2021)   No facility-administered encounter medications on file as of 01/15/2022.    Allergies (verified) Influenza vaccines, Pneumococcal vaccines, Simvastatin, Tetanus toxoids, and Zoster vaccine live   History: Past Medical History:  Diagnosis Date   CHICKENPOX, HX OF 03/21/2010   Qualifier: Diagnosis of  By: Marca Ancona RMA,  Lucy     CVA (cerebral infarction) 02/2010   no residual deficits, a/w mid basal art stenosis, declined IC stent as rec by IR/neuro   Dyslipidemia    intol of statin due to >3x increase LFTs   GERD (gastroesophageal reflux disease)    hx   Hypertension, essential    Malignant neoplasm of upper-inner quadrant of left breast in female, estrogen receptor positive ([place])  06/20/2017   Osteoarthritis    Stroke [facility])    Past Surgical History:  Procedure Laterality Date   CHOLECYSTECTOMY  1981   MASTECTOMY W/ SENTINEL NODE BIOPSY Left 07/30/2017   Procedure: LEFT TOTAL MASTECTOMY WITH AXILLARY SENTINEL LYMPH NODE BIOPSY;  Surgeon: Rolm Bookbinder, MD;  Location: [facility] OR;  Service: General;  Laterality: Left;   Family History  Problem Relation Age of Onset   Breast cancer Mother 64   Arthritis Other    Breast cancer Maternal Aunt    Social History   Socioeconomic History   Marital status: Divorced    Spouse name: Not on file   Number of children: Not on file   Years of education: Not on file   Highest education level: Not on file  Occupational History   Not on file  Tobacco Use   Smoking status: Never   Smokeless tobacco: Never  Vaping Use   Vaping Use: Never used  Substance and Sexual Activity   Alcohol use: No    Alcohol/week: 0.0 standard drinks   Drug use: No   Sexual activity: Not on file  Other Topics Concern   Not on file  Social History Narrative   Divorced  >40 yrs, single and lives alone.    Supportive daughters, 1 is a Therapist, sports at [employer] [place]).    Patient enjoys gardening, dancing and caring for her grand children.   Social Determinants of Health   Financial Resource Strain: Low Risk    Difficulty of Paying Living Expenses: Not hard at all  Food Insecurity: No Food Insecurity   Worried About Charity fundraiser in the Last Year: Never true   [place] in the Last Year: Never true  Transportation Needs: No Transportation Needs   Lack of Transportation (Medical): No   Lack of Transportation (Non-Medical): No  Physical Activity: Sufficiently Active   Days of Exercise per Week: 5 days   Minutes of Exercise per Session: 30 min  Stress: No Stress Concern Present   Feeling of Stress : Not at all  Social Connections: Moderately Integrated   Frequency of Communication with Friends and Family: More than three times a  week   Frequency of Social Gatherings with Friends and Family: Not on file   Attends Religious Services: 1 to 4 times per year   Active Member of [employer] or Organizations: Yes   Attends Archivist Meetings: 1 to 4 times per year   Marital Status: Divorced    Tobacco Counseling Counseling given: Not Answered   Clinical Intake:  Pre-visit preparation completed: No  Pain : No/denies pain     Nutritional Risks: None Diabetes: Yes (Prediabetes) CBG done?: No Did pt. bring in CBG monitor from home?: No  How often do you need to have someone help you when you read instructions, pamphlets, or other written materials from your doctor or pharmacy?: 1 - Never What is the last grade level you completed in school?: HSG  Diabetic? prediabetes  Interpreter Needed?: No  Information entered by :: Lisette Abu, LPN  Activities of Daily Living In your present state of health, do you have any difficulty performing the following activities: 01/15/2022 03/13/2021  Hearing? N N  Vision? N N  Difficulty concentrating or making decisions? N N  Walking or climbing stairs? N N  Dressing or bathing? N N  Doing errands, shopping? N N  Preparing Food and eating ? N -  Using the Toilet? N -  In the past six months, have you accidently leaked urine? N -  Do you have problems with loss of bowel control? N -  Managing your Medications? N -  Managing your Finances? N -  Housekeeping or managing your Housekeeping? N -  Some recent data might be hidden    Patient Care Team: Binnie Rail, MD as PCP - General (Internal Medicine) Garvin Fila, MD as Consulting Physician (Neurology) Rolm Bookbinder, MD as Consulting Physician (General Surgery) Truitt Merle, MD as Consulting Physician (Hematology) Kyung Rudd, MD as Consulting Physician (Radiation Oncology)  Indicate any recent Medical Services you may have received from other than [facility] providers in the past year (date may be  approximate).     Assessment:   This is a routine wellness examination for [place].  Hearing/Vision screen Hearing Screening - Comments:: Patient denied any hearing difficulty.   No hearing aids.  Vision Screening - Comments:: Patient wears corrective glasses/contacts.  Eye exam done annually by: [facility]  Dietary issues and exercise activities discussed: Current Exercise Habits: Home exercise routine, Type of exercise: walking, Time (Minutes): 30, Frequency (Times/Week): 5, Weekly Exercise (Minutes/Week): 150, Intensity: Mild, Exercise limited by: orthopedic condition(s)   Goals Addressed             This Visit's Progress    Patient Stated       MY GOAL IS TO CONTINUE TO [place].      Depression Screen PHQ 2/9 Scores 01/15/2022 03/14/2020 02/03/2019 02/10/2018 11/02/2016 08/03/2015 04/30/2014  PHQ - 2 Score 0 0 0 0 0 0 0    Fall Risk Fall Risk  01/15/2022 03/14/2020 02/03/2019 02/10/2018 11/02/2016  Falls in the past year? 0 0 0 No No  Number falls in past yr: 0 0 - - -  Injury with Fall? 0 0 - - -  Risk for fall due to : No Fall Risks - - - -  Follow up Falls evaluation completed Falls evaluation completed - - -    FALL RISK PREVENTION PERTAINING TO THE HOME:  Any stairs in or around the home? Yes  If so, are there any without handrails? No  Home free of loose throw rugs in walkways, pet beds, electrical cords, etc? Yes  Adequate lighting in your home to reduce risk of falls? Yes   ASSISTIVE DEVICES UTILIZED TO PREVENT FALLS:  Life alert? No  Use of a cane, walker or w/c? No  Grab bars in the bathroom? Yes  Shower chair or bench in shower? No  Elevated toilet seat or a handicapped toilet? Yes   TIMED UP AND GO:  Was the test performed? No .  Length of time to ambulate 10 feet: n/a sec.   Gait steady and fast without use of assistive device  Cognitive Function: Normal cognitive status assessed by direct observation by this Nurse Health Advisor. No  abnormalities found.          Immunizations Immunization History  Administered Date(s) Administered   PFIZER(Purple Top)SARS-COV-2 Vaccination 09/05/2020, 10/28/2020    TDAP status: Due, Education has been provided  regarding the importance of this vaccine. Advised may receive this vaccine at local pharmacy or Health Dept. Aware to provide a copy of the vaccination record if obtained from local pharmacy or Health Dept. Verbalized acceptance and understanding.  Flu Vaccine status: Declined, Education has been provided regarding the importance of this vaccine but patient still declined. Advised may receive this vaccine at local pharmacy or Health Dept. Aware to provide a copy of the vaccination record if obtained from local pharmacy or Health Dept. Verbalized acceptance and understanding.  Pneumococcal vaccine status: Declined,  Education has been provided regarding the importance of this vaccine but patient still declined. Advised may receive this vaccine at local pharmacy or Health Dept. Aware to provide a copy of the vaccination record if obtained from local pharmacy or Health Dept. Verbalized acceptance and understanding.   Covid-19 vaccine status: Completed vaccines  Qualifies for Shingles Vaccine? Yes   Zostavax completed No   Shingrix Completed?: No.    Education has been provided regarding the importance of this vaccine. Patient has been advised to call insurance company to determine out of pocket expense if they have not yet received this vaccine. Advised may also receive vaccine at local pharmacy or Health Dept. Verbalized acceptance and understanding.  Screening Tests Health Maintenance  Topic Date Due   Pneumonia Vaccine 73+ Years old (1 - PCV) Never done   Zoster Vaccines- Shingrix (1 of 2) Never done   MAMMOGRAM  06/17/2018   COVID-19 Vaccine (3 - Pfizer risk series) 11/25/2020   DEXA SCAN  09/12/2022   HPV VACCINES  Aged Out    Health Maintenance  Health Maintenance  Due  Topic Date Due   Pneumonia Vaccine 41+ Years old (1 - PCV) Never done   Zoster Vaccines- Shingrix (1 of 2) Never done   MAMMOGRAM  06/17/2018   COVID-19 Vaccine (3 - Pfizer risk series) 11/25/2020    Colorectal cancer screening: No longer required.   Mammogram status: No longer required due to history of breast cancer.  Bone Density status: Completed 09/12/2020. Results reflect: Bone density results: OSTEOPENIA. Repeat every 2 years.  Lung Cancer Screening: (Low Dose CT Chest recommended if Age 9-80 years, 30 pack-year currently smoking OR have quit w/in 15years.) does not qualify.   Lung Cancer Screening Referral: no  Additional Screening:  Hepatitis C Screening: does not qualify; Completed no  Vision Screening: Recommended annual ophthalmology exams for early detection of glaucoma and other disorders of the eye. Is the patient up to date with their annual eye exam?  Yes  Who is the provider or what is the name of the office in which the patient attends annual eye exams? [facility] If pt is not established with a provider, would they like to be referred to a provider to establish care? No .   Dental Screening: Recommended annual dental exams for proper oral hygiene  Community Resource Referral / Chronic Care Management: CRR required this visit?  No   CCM required this visit?  No      Plan:     I have personally reviewed and noted the following in the patients chart:   Medical and social history Use of alcohol, tobacco or illicit drugs  Current medications and supplements including opioid prescriptions.  Functional ability and status Nutritional status Physical activity Advanced directives List of other physicians Hospitalizations, surgeries, and ER visits in previous 12 months Vitals Screenings to include cognitive, depression, and falls Referrals and appointments  In addition, I have reviewed and  discussed with patient certain preventive protocols,  quality metrics, and best practice recommendations. A written personalized care plan for preventive services as well as general preventive health recommendations were provided to patient.     Sheral Flow, LPN   3/71/0626   Nurse Notes:  Patient is cogitatively intact. There were no vitals filed for this visit. There is no height or weight on file to calculate BMI. Patient stated that she has no issues with gait or balance; does not use any assistive devices. Medications reviewed with patient; no opioid use noted.

## 2022-01-15 NOTE — Patient Instructions (Signed)
Ms. Paone , Thank you for taking time to come for your Medicare Wellness Visit. I appreciate your ongoing commitment to your health goals. Please review the following plan we discussed and let me know if I can assist you in the future.   Screening recommendations/referrals: Colonoscopy: NOT A CANDIDATE FOR SCREENING DUE TO AGE Mammogram: HISTORY OF BREAST CANCER; NO LONGER NEEDED Bone Density: 09/12/2020; DUE EVERY 2 YEARS FOR OSTEOPENIA Recommended yearly ophthalmology/optometry visit for glaucoma screening and checkup Recommended yearly dental visit for hygiene and checkup  Vaccinations: Influenza vaccine: DECLINED Pneumococcal vaccine: DECLINED Tdap vaccine: DECLINED Shingles vaccine: DECLINED   Covid-19: 09/05/2020, 10/28/2020  Advanced directives: Advance directive discussed with you today. Even though you declined this today please call our office should you change your mind and we can give you the proper paperwork for you to fill out.  Conditions/risks identified: YES; CONTINUE TO BE INDEPENDENT AND ACTIVE.  Next appointment: SCHEDULED FOR 01/16/2023 AT 10:40 AM TELEPHONE VISIT WITH HEALTH COACH.   Preventive Care 82 Years and Older, Female Preventive care refers to lifestyle choices and visits with your health care provider that can promote health and wellness. What does preventive care include? A yearly physical exam. This is also called an annual well check. Dental exams once or twice a year. Routine eye exams. Ask your health care provider how often you should have your eyes checked. Personal lifestyle choices, including: Daily care of your teeth and gums. Regular physical activity. Eating a healthy diet. Avoiding tobacco and drug use. Limiting alcohol use. Practicing safe sex. Taking low-dose aspirin every day. Taking vitamin and mineral supplements as recommended by your health care provider. What happens during an annual well check? The services and screenings done  by your health care provider during your annual well check will depend on your age, overall health, lifestyle risk factors, and family history of disease. Counseling  Your health care provider may ask you questions about your: Alcohol use. Tobacco use. Drug use. Emotional well-being. Home and relationship well-being. Sexual activity. Eating habits. History of falls. Memory and ability to understand (cognition). Work and work Statistician. Reproductive health. Screening  You may have the following tests or measurements: Height, weight, and BMI. Blood pressure. Lipid and cholesterol levels. These may be checked every 5 years, or more frequently if you are over 82 years old. Skin check. Lung cancer screening. You may have this screening every year starting at age 82 if you have a 30-pack-year history of smoking and currently smoke or have quit within the past 15 years. Fecal occult blood test (FOBT) of the stool. You may have this test every year starting at age 82. Flexible sigmoidoscopy or colonoscopy. You may have a sigmoidoscopy every 5 years or a colonoscopy every 10 years starting at age 82. Hepatitis C blood test. Hepatitis B blood test. Sexually transmitted disease (STD) testing. Diabetes screening. This is done by checking your blood sugar (glucose) after you have not eaten for a while (fasting). You may have this done every 1-3 years. Bone density scan. This is done to screen for osteoporosis. You may have this done starting at age 82. Mammogram. This may be done every 1-2 years. Talk to your health care provider about how often you should have regular mammograms. Talk with your health care provider about your test results, treatment options, and if necessary, the need for more tests. Vaccines  Your health care provider may recommend certain vaccines, such as: Influenza vaccine. This is recommended every year. Tetanus,  diphtheria, and acellular pertussis (Tdap, Td) vaccine. You  may need a Td booster every 10 years. Zoster vaccine. You may need this after age 98. Pneumococcal 13-valent conjugate (PCV13) vaccine. One dose is recommended after age 82. Pneumococcal polysaccharide (PPSV23) vaccine. One dose is recommended after age 82. Talk to your health care provider about which screenings and vaccines you need and how often you need them. This information is not intended to replace advice given to you by your health care provider. Make sure you discuss any questions you have with your health care provider. Document Released: 12/09/2015 Document Revised: 08/01/2016 Document Reviewed: 09/13/2015 Elsevier Interactive Patient Education  2017 [place] Prevention in the Home Falls can cause injuries. They can happen to people of all ages. There are many things you can do to make your home safe and to help prevent falls. What can I do on the outside of my home? Regularly fix the edges of walkways and driveways and fix any cracks. Remove anything that might make you trip as you walk through a door, such as a raised step or threshold. Trim any bushes or trees on the path to your home. Use bright outdoor lighting. Clear any walking paths of anything that might make someone trip, such as rocks or tools. Regularly check to see if handrails are loose or broken. Make sure that both sides of any steps have handrails. Any raised decks and porches should have guardrails on the edges. Have any leaves, snow, or ice cleared regularly. Use sand or salt on walking paths during winter. Clean up any spills in your garage right away. This includes oil or grease spills. What can I do in the bathroom? Use night lights. Install grab bars by the toilet and in the tub and shower. Do not use towel bars as grab bars. Use non-skid mats or decals in the tub or shower. If you need to sit down in the shower, use a plastic, non-slip stool. Keep the floor dry. Clean up any water that spills  on the floor as soon as it happens. Remove soap buildup in the tub or shower regularly. Attach bath mats securely with double-sided non-slip rug tape. Do not have throw rugs and other things on the floor that can make you trip. What can I do in the bedroom? Use night lights. Make sure that you have a light by your bed that is easy to reach. Do not use any sheets or blankets that are too big for your bed. They should not hang down onto the floor. Have a firm chair that has side arms. You can use this for support while you get dressed. Do not have throw rugs and other things on the floor that can make you trip. What can I do in the kitchen? Clean up any spills right away. Avoid walking on wet floors. Keep items that you use a lot in easy-to-reach places. If you need to reach something above you, use a strong step stool that has a grab bar. Keep electrical cords out of the way. Do not use floor polish or wax that makes floors slippery. If you must use wax, use non-skid floor wax. Do not have throw rugs and other things on the floor that can make you trip. What can I do with my stairs? Do not leave any items on the stairs. Make sure that there are handrails on both sides of the stairs and use them. Fix handrails that are broken or loose. Make  sure that handrails are as long as the stairways. Check any carpeting to make sure that it is firmly attached to the stairs. Fix any carpet that is loose or worn. Avoid having throw rugs at the top or bottom of the stairs. If you do have throw rugs, attach them to the floor with carpet tape. Make sure that you have a light switch at the top of the stairs and the bottom of the stairs. If you do not have them, ask someone to add them for you. What else can I do to help prevent falls? Wear shoes that: Do not have high heels. Have rubber bottoms. Are comfortable and fit you well. Are closed at the toe. Do not wear sandals. If you use a stepladder: Make  sure that it is fully opened. Do not climb a closed stepladder. Make sure that both sides of the stepladder are locked into place. Ask someone to hold it for you, if possible. Clearly mark and make sure that you can see: Any grab bars or handrails. First and last steps. Where the edge of each step is. Use tools that help you move around (mobility aids) if they are needed. These include: Canes. Walkers. Scooters. Crutches. Turn on the lights when you go into a dark area. Replace any light bulbs as soon as they burn out. Set up your furniture so you have a clear path. Avoid moving your furniture around. If any of your floors are uneven, fix them. If there are any pets around you, be aware of where they are. Review your medicines with your doctor. Some medicines can make you feel dizzy. This can increase your chance of falling. Ask your doctor what other things that you can do to help prevent falls. This information is not intended to replace advice given to you by your health care provider. Make sure you discuss any questions you have with your health care provider. Document Released: 09/08/2009 Document Revised: 04/19/2016 Document Reviewed: 12/17/2014 Elsevier Interactive Patient Education  2017 [employer].

## 2022-01-22 ENCOUNTER — Inpatient Hospital Stay: Payer: Medicare FFS

## 2022-01-22 ENCOUNTER — Other Ambulatory Visit: Payer: Self-pay

## 2022-01-22 DIAGNOSIS — C50212 Malignant neoplasm of upper-inner quadrant of left female breast: Secondary | ICD-10-CM

## 2022-01-22 DIAGNOSIS — Z170 Estrogen receptor positive status [ER+]: Secondary | ICD-10-CM

## 2022-01-22 LAB — COMPREHENSIVE METABOLIC PANEL
ALT: 15 U/L (ref 0–44)
AST: 19 U/L (ref 15–41)
Albumin: 3.7 g/dL (ref 3.5–5.0)
Alkaline Phosphatase: 114 U/L (ref 38–126)
Anion gap: 9 (ref 5–15)
BUN: 40 mg/dL — ABNORMAL HIGH (ref 8–23)
CO2: 24 mmol/L (ref 22–32)
Calcium: 9.6 mg/dL (ref 8.9–10.3)
Chloride: 108 mmol/L (ref 98–111)
Creatinine, Ser: 1.56 mg/dL — ABNORMAL HIGH (ref 0.44–1.00)
GFR, Estimated: 33 mL/min — ABNORMAL LOW (ref >60)
Glucose, Bld: 148 mg/dL — ABNORMAL HIGH (ref 70–99)
Potassium: 4.2 mmol/L (ref 3.5–5.1)
Sodium: 141 mmol/L (ref 135–145)
Total Bilirubin: 0.4 mg/dL (ref 0.3–1.2)
Total Protein: 7.4 g/dL (ref 6.5–8.1)

## 2022-01-22 LAB — CBC WITH DIFFERENTIAL/PLATELET
Abs Immature Granulocytes: 0.01 10*3/uL (ref 0.00–0.07)
Basophils Absolute: 0.1 10*3/uL (ref 0.0–0.1)
Basophils Relative: 2 %
Eosinophils Absolute: 0.1 10*3/uL (ref 0.0–0.5)
Eosinophils Relative: 3 %
HCT: 29.2 % — ABNORMAL LOW (ref 36.0–46.0)
Hemoglobin: 10 g/dL — ABNORMAL LOW (ref 12.0–15.0)
Immature Granulocytes: 0 %
Lymphocytes Relative: 28 %
Lymphs Abs: 0.9 10*3/uL (ref 0.7–4.0)
MCH: 40.7 pg — ABNORMAL HIGH (ref 26.0–34.0)
MCHC: 34.2 g/dL (ref 30.0–36.0)
MCV: 118.7 fL — ABNORMAL HIGH (ref 80.0–100.0)
Monocytes Absolute: 0.3 10*3/uL (ref 0.1–1.0)
Monocytes Relative: 10 %
Neutro Abs: 1.9 10*3/uL (ref 1.7–7.7)
Neutrophils Relative %: 57 %
Platelets: 184 10*3/uL (ref 150–400)
RBC: 2.46 MIL/uL — ABNORMAL LOW (ref 3.87–5.11)
RDW: 16.4 % — ABNORMAL HIGH (ref 11.5–15.5)
WBC: 3.4 10*3/uL — ABNORMAL LOW (ref 4.0–10.5)
nRBC: 0 % (ref 0.0–0.2)

## 2022-01-23 LAB — CANCER ANTIGEN 27.29: CA 27.29: 62.8 U/mL — ABNORMAL HIGH (ref 0.0–38.6)

## 2022-01-25 ENCOUNTER — Telehealth: Payer: Self-pay

## 2022-01-25 ENCOUNTER — Inpatient Hospital Stay: Payer: Medicare FFS | Admitting: Hematology

## 2022-01-25 NOTE — Telephone Encounter (Signed)
Spoke with pt via telephone regarding appt scheduled today with Dr. Burr Medico.  Informed pt that appt will need to be rescheduled d/t NM PET Scan [place] was never scheduled.  The expectation date on the order was 03/21/2022; thus, why the PET Scan was never scheduled.  Changed the expect date on the PET to 02/09/2022.  Pt stated during telephone conversation that she can only have her PET's done on Monday and Fridays d/t her daughter's schedule.  Informed pt that I would make note of this scheduling request.  Notified Alferd Apa, LPN of the PET changes and pt's scheduling request through staff message.  Pt had no further questions or concerns at this time.  ?

## 2022-02-14 ENCOUNTER — Other Ambulatory Visit: Payer: Self-pay | Admitting: Internal Medicine

## 2022-03-18 ENCOUNTER — Encounter: Payer: Self-pay | Admitting: Internal Medicine

## 2022-03-18 NOTE — Progress Notes (Signed)
? ? ? ? ?Subjective:  ? ? Patient ID: Shirley Wells, female    DOB: 09-28-1941, 81 y.o.   MRN: [id number] ? ?This visit occurred during the SARS-CoV-2 public health emergency.  Safety protocols were in place, including screening questions prior to the visit, additional usage of staff PPE, and extensive cleaning of exam room while observing appropriate contact time as indicated for disinfecting solutions.   ? ? ?HPI ?[name] is here for follow up of her chronic medical problems, including metastatic breast ca, CKD, htn, prediabetes, leg edema, CVA, hld, osteopenia ? ?Labs 12/2021.   ? ?Had blurry visioin, double vision - dx with dry eyes.  Using refresh.  The blurry vision has resolved.  ? ?Medications and allergies reviewed with patient and updated if appropriate. ? ?Current Outpatient Medications on File Prior to Visit  ?Medication Sig Dispense Refill  ? acetaminophen (TYLENOL) 650 MG CR tablet Take 1,300 mg by mouth every 8 (eight) hours as needed for pain.    ? amLODipine (NORVASC) 5 MG tablet Take 1 tablet (5 mg total) by mouth daily. 90 tablet 3  ? anastrozole (ARIMIDEX) 1 MG tablet Take 1 tablet (1 mg total) by mouth daily. 90 tablet 3  ? aspirin 81 MG tablet Take 81 mg by mouth 2 (two) times a week.    ? clopidogrel (PLAVIX) 75 MG tablet TAKE 1 TABLET(75 MG) BY MOUTH DAILY 90 tablet 1  ? furosemide (LASIX) 20 MG tablet Take 20 mg by mouth daily as needed for fluid.    ? lisinopril (ZESTRIL) 10 MG tablet TAKE 1 TABLET(10 MG) BY MOUTH DAILY 90 tablet 0  ? loratadine (CLARITIN) 10 MG tablet Take 10 mg by mouth daily as needed for allergies.    ? Menthol, Topical Analgesic, (BIOFREEZE EX) Apply 1 application topically daily as needed (muscle pain).    ? Multiple Vitamin (MULTIVITAMIN) capsule Take 1 capsule by mouth daily.    ? palbociclib (IBRANCE) 75 MG tablet Take 1 tablet (75 mg total) by mouth daily. Take for 14 days on, 7 days off, repeat every 21 days. 14 tablet 3  ? ?No current facility-administered medications  on file prior to visit.  ? ? ? ?Review of Systems  ?Constitutional:  Negative for appetite change, chills and fever.  ?Respiratory:  Negative for cough, shortness of breath and wheezing.   ?Cardiovascular:  Positive for leg swelling (hot days when up a lot). Negative for chest pain and palpitations.  ?Neurological:  Negative for light-headedness and headaches.  ?Psychiatric/Behavioral:  Negative for dysphoric mood and sleep disturbance. The patient is not nervous/anxious.   ? ?   ?Objective:  ? ?Vitals:  ? 03/19/22 1300  ?BP: 128/60  ?Pulse: 67  ?Temp: 98 ?F (36.7 ?C)  ?SpO2: 96%  ? ?BP Readings from Last 3 Encounters:  ?03/19/22 128/60  ?01/01/22 (!) 151/64  ?10/02/21 (!) 156/57  ? ?Wt Readings from Last 3 Encounters:  ?03/19/22 139 lb 6.4 oz (63.2 kg)  ?01/01/22 133 lb (60.3 kg)  ?10/02/21 138 lb 8 oz (62.8 kg)  ? ?Body mass index is 25.5 kg/m?. ? ?  ?Physical Exam ?Constitutional:   ?   General: She is not in acute distress. ?   Appearance: Normal appearance.  ?HENT:  ?   Head: Normocephalic and atraumatic.  ?Eyes:  ?   Conjunctiva/sclera: Conjunctivae normal.  ?Cardiovascular:  ?   Rate and Rhythm: Normal rate and regular rhythm.  ?   Heart sounds: Normal heart sounds. No murmur heard. ?  Pulmonary:  ?   Effort: Pulmonary effort is normal. No respiratory distress.  ?   Breath sounds: Normal breath sounds. No wheezing.  ?Musculoskeletal:  ?   Cervical back: Neck supple.  ?   Right lower leg: No edema.  ?   Left lower leg: No edema.  ?Lymphadenopathy:  ?   Cervical: No cervical adenopathy.  ?Skin: ?   General: Skin is warm and dry.  ?   Findings: No rash.  ?Neurological:  ?   Mental Status: She is alert. Mental status is at baseline.  ?Psychiatric:     ?   Mood and Affect: Mood normal.     ?   Behavior: Behavior normal.  ? ?   ? ?Lab Results  ?Component Value Date  ? WBC 3.4 (L) 01/22/2022  ? HGB 10.0 (L) 01/22/2022  ? HCT 29.2 (L) 01/22/2022  ? PLT 184 01/22/2022  ? GLUCOSE 148 (H) 01/22/2022  ? CHOL 304 (H)  09/12/2020  ? TRIG (H) 09/12/2020  ?  462.0 Triglyceride is over 400; calculations on Lipids are invalid.  ? HDL 47.70 09/12/2020  ? LDLDIRECT 58.0 09/12/2020  ? LDLCALC 232 (H) 04/30/2014  ? ALT 15 01/22/2022  ? AST 19 01/22/2022  ? NA 141 01/22/2022  ? K 4.2 01/22/2022  ? CL 108 01/22/2022  ? CREATININE 1.56 (H) 01/22/2022  ? BUN 40 (H) 01/22/2022  ? CO2 24 01/22/2022  ? TSH 1.67 11/02/2016  ? INR 1.1 12/09/2020  ? HGBA1C 5.1 03/19/2022  ? HGBA1C 5.1 03/19/2022  ? HGBA1C 5.1 (A) 03/19/2022  ? HGBA1C 5.1 03/19/2022  ? ? ? ?Assessment & Plan:  ? ? ?See Problem List for Assessment and Plan of chronic medical problems.  ? ? ?

## 2022-03-18 NOTE — Patient Instructions (Addendum)
? ? ? ? ? ?  Medications changes include :   none ? ? ?Your prescription(s) have been sent to your pharmacy.  ? ? ?Return in about 6 months (around 09/18/2022) for follow up. ? ?

## 2022-03-19 ENCOUNTER — Ambulatory Visit (INDEPENDENT_AMBULATORY_CARE_PROVIDER_SITE_OTHER): Payer: Medicare FFS | Admitting: Internal Medicine

## 2022-03-19 VITALS — BP 128/60 | HR 67 | Temp 98.0°F | Ht 62.0 in | Wt 139.4 lb

## 2022-03-19 DIAGNOSIS — I10 Essential (primary) hypertension: Secondary | ICD-10-CM | POA: Diagnosis not present

## 2022-03-19 DIAGNOSIS — Z8673 Personal history of transient ischemic attack (TIA), and cerebral infarction without residual deficits: Secondary | ICD-10-CM | POA: Diagnosis not present

## 2022-03-19 DIAGNOSIS — N1832 Chronic kidney disease, stage 3b: Secondary | ICD-10-CM

## 2022-03-19 DIAGNOSIS — R600 Localized edema: Secondary | ICD-10-CM

## 2022-03-19 DIAGNOSIS — R7303 Prediabetes: Secondary | ICD-10-CM

## 2022-03-19 DIAGNOSIS — C50919 Malignant neoplasm of unspecified site of unspecified female breast: Secondary | ICD-10-CM

## 2022-03-19 DIAGNOSIS — E785 Hyperlipidemia, unspecified: Secondary | ICD-10-CM | POA: Diagnosis not present

## 2022-03-19 LAB — POCT GLYCOSYLATED HEMOGLOBIN (HGB A1C)
HbA1c POC (<> result, manual entry): 5.1 % (ref 4.0–5.6)
HbA1c, POC (controlled diabetic range): 5.1 % (ref 0.0–7.0)
HbA1c, POC (prediabetic range): 5.1 % — AB (ref 5.7–6.4)
Hemoglobin A1C: 5.1 % (ref 4.0–5.6)

## 2022-03-19 MED ORDER — LISINOPRIL 10 MG PO TABS: ORAL_TABLET | ORAL | 1 refills | Status: DC

## 2022-03-19 NOTE — Assessment & Plan Note (Signed)
Chronic ?Refused statins ?

## 2022-03-19 NOTE — Assessment & Plan Note (Addendum)
Chronic ?BP well controlled ?Continue lisinopril 10 mg daily ? ?

## 2022-03-19 NOTE — Assessment & Plan Note (Addendum)
Chronic ?Stable ?Following with nephrology ?BP well controlled ?a1c normal ?Continue lisinopril 10 mg daily ?

## 2022-03-19 NOTE — Assessment & Plan Note (Signed)
Chronic ?Following with oncology ?On arimidex 1 mg daily and ibrance ?

## 2022-03-19 NOTE — Assessment & Plan Note (Signed)
Chronic ?Occasional edema that is mild - does not take lasix  - agree to avoid unless edema is significant ?

## 2022-03-19 NOTE — Assessment & Plan Note (Signed)
Chronic ?Lab Results  ?Component Value Date  ? HGBA1C 5.1 03/19/2022  ? HGBA1C 5.1 03/19/2022  ? HGBA1C 5.1 (A) 03/19/2022  ? HGBA1C 5.1 03/19/2022  ? ?Well controlled ?Low sugar diet discussed ?

## 2022-03-19 NOTE — Assessment & Plan Note (Signed)
Chronic ?H/o cva ?Continue ASA 81 mg daily, plavix 75 mg daily, lisinopril 10 mg daily ?BP controlled ?a1c normal ?

## 2022-03-26 ENCOUNTER — Other Ambulatory Visit: Payer: Self-pay

## 2022-03-26 ENCOUNTER — Encounter: Payer: Self-pay | Admitting: Hematology

## 2022-03-26 ENCOUNTER — Inpatient Hospital Stay: Payer: Medicare FFS | Attending: Hematology

## 2022-03-26 ENCOUNTER — Ambulatory Visit (HOSPITAL_COMMUNITY)
Admission: RE | Admit: 2022-03-26 | Discharge: 2022-03-26 | Disposition: A | Discharge status: Home / Self Care | Payer: Medicare PPO | Source: Ambulatory Visit | Attending: Hematology | Admitting: Hematology

## 2022-03-26 DIAGNOSIS — Z170 Estrogen receptor positive status [ER+]: Secondary | ICD-10-CM | POA: Insufficient documentation

## 2022-03-26 DIAGNOSIS — C50212 Malignant neoplasm of upper-inner quadrant of left female breast: Secondary | ICD-10-CM | POA: Insufficient documentation

## 2022-03-26 LAB — COMPREHENSIVE METABOLIC PANEL
ALT: 13 U/L (ref 0–44)
AST: 20 U/L (ref 15–41)
Albumin: 3.9 g/dL (ref 3.5–5.0)
Alkaline Phosphatase: 111 U/L (ref 38–126)
Anion gap: 7 (ref 5–15)
BUN: 41 mg/dL — ABNORMAL HIGH (ref 8–23)
CO2: 25 mmol/L (ref 22–32)
Calcium: 9.6 mg/dL (ref 8.9–10.3)
Chloride: 111 mmol/L (ref 98–111)
Creatinine, Ser: 1.51 mg/dL — ABNORMAL HIGH (ref 0.44–1.00)
GFR, Estimated: 35 mL/min — ABNORMAL LOW (ref >60)
Glucose, Bld: 91 mg/dL (ref 70–99)
Potassium: 4.4 mmol/L (ref 3.5–5.1)
Sodium: 143 mmol/L (ref 135–145)
Total Bilirubin: 0.3 mg/dL (ref 0.3–1.2)
Total Protein: 7.4 g/dL (ref 6.5–8.1)

## 2022-03-26 LAB — CBC WITH DIFFERENTIAL/PLATELET
Abs Immature Granulocytes: 0.01 10*3/uL (ref 0.00–0.07)
Basophils Absolute: 0 10*3/uL (ref 0.0–0.1)
Basophils Relative: 2 %
Eosinophils Absolute: 0.1 10*3/uL (ref 0.0–0.5)
Eosinophils Relative: 4 %
HCT: 32 % — ABNORMAL LOW (ref 36.0–46.0)
Hemoglobin: 10.7 g/dL — ABNORMAL LOW (ref 12.0–15.0)
Immature Granulocytes: 0 %
Lymphocytes Relative: 43 %
Lymphs Abs: 1.1 10*3/uL (ref 0.7–4.0)
MCH: 38.8 pg — ABNORMAL HIGH (ref 26.0–34.0)
MCHC: 33.4 g/dL (ref 30.0–36.0)
MCV: 115.9 fL — ABNORMAL HIGH (ref 80.0–100.0)
Monocytes Absolute: 0.2 10*3/uL (ref 0.1–1.0)
Monocytes Relative: 8 %
Neutro Abs: 1.1 10*3/uL — ABNORMAL LOW (ref 1.7–7.7)
Neutrophils Relative %: 43 %
Platelets: 108 10*3/uL — ABNORMAL LOW (ref 150–400)
RBC: 2.76 MIL/uL — ABNORMAL LOW (ref 3.87–5.11)
RDW: 12.5 % (ref 11.5–15.5)
WBC: 2.5 10*3/uL — ABNORMAL LOW (ref 4.0–10.5)
nRBC: 0 % (ref 0.0–0.2)

## 2022-03-26 LAB — GLUCOSE, CAPILLARY: Glucose-Capillary: 118 mg/dL — ABNORMAL HIGH (ref 70–99)

## 2022-03-26 MED ORDER — FLUDEOXYGLUCOSE F - 18 (FDG) INJECTION
6.9500 | Freq: Once | INTRAVENOUS | Status: AC | PRN
  Administered 2022-03-26: 6.84 via INTRAVENOUS

## 2022-03-29 ENCOUNTER — Telehealth: Payer: Medicare FFS | Admitting: Hematology

## 2022-03-29 ENCOUNTER — Other Ambulatory Visit: Payer: Self-pay | Admitting: Hematology

## 2022-03-29 DIAGNOSIS — Z170 Estrogen receptor positive status [ER+]: Secondary | ICD-10-CM

## 2022-03-30 ENCOUNTER — Encounter: Payer: Self-pay | Admitting: Hematology

## 2022-03-30 ENCOUNTER — Inpatient Hospital Stay (HOSPITAL_BASED_OUTPATIENT_CLINIC_OR_DEPARTMENT_OTHER): Payer: Medicare FFS | Admitting: Hematology

## 2022-03-30 DIAGNOSIS — C50212 Malignant neoplasm of upper-inner quadrant of left female breast: Secondary | ICD-10-CM | POA: Diagnosis not present

## 2022-03-30 DIAGNOSIS — Z170 Estrogen receptor positive status [ER+]: Secondary | ICD-10-CM

## 2022-03-30 MED ORDER — PALBOCICLIB 100 MG PO CAPS: 100.0000 mg | ORAL_CAPSULE | Freq: Every day | ORAL | 1 refills | Status: DC

## 2022-03-30 NOTE — Progress Notes (Signed)
?Shirley Wells   ?[phone] [phone]   ?Clinic Follow up Note  ? ?Patient Care Team: ?Binnie Rail, MD as PCP - General (Internal Medicine) ?Garvin Fila, MD as Consulting Physician (Neurology) ?Rolm Bookbinder, MD as Consulting Physician (General Surgery) ?Truitt Merle, MD as Consulting Physician (Hematology) ?Kyung Rudd, MD as Consulting Physician (Radiation Oncology) ? ?Date of Service:  03/30/2022 ? ?I connected with Shirley Wells on 03/30/2022 at  9:40 AM EDT by telephone visit and verified that I am speaking with the correct person using two identifiers.  ?I discussed the limitations, risks, security and privacy concerns of performing an evaluation and management service by telephone and the availability of in person appointments. I also discussed with the patient that there may be a patient responsible charge related to this service. The patient expressed understanding and agreed to proceed.  ? ?Other persons participating in the visit and their role in the encounter:  pt's daughter Shirley Wells (at home) ? ?Patient's location:  home  ?Provider's location:  my office ? ?CHIEF COMPLAINT: f/u of metastatic breast cancer ? ?CURRENT THERAPY:  ?-Zometa, starting 11/29/20, currently intermittent due to CKD ?-Anastrozole, starting 11/2020 ?-Ibrance, starting 01/09/21, currently 75 mg days 1-14 q21d, plan to increase to 144m day 1-14 every 21 days  ? ?ASSESSMENT & PLAN:  ?RSURAYA VIDRINEis a 81y.o. female with  ? ?1. 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 S/p left total mastectomy. She declined antiestrogen therapy due to concerns of side effects. She lost follow up after 07/2017.   ?-After 6 months of left hip and ribcage pain she was found to have diffuse bone metastasis on 11/28/20 PET. This was confirmed  on 12/09/20 bone biopsy which showed metastatic carcinoma compatible from prior breast cancer, ER 50%+, PR 15% positive and HER2 negative. ?-she started first-line Anastrozole in 11/2020 and Ibrance 1035m3 weeks on/1 week off starting 01/09/21. Reduced to 758mtarting with C2 and reduced to 2 weeks on/1 week off starting with C3 on 03/13/21. She is tolerating better.  ?-restaging PET from 03/26/22 showed: interval increase in metabolic activity within L2 vertebral body; new activity associated with nondisplaced pathologic fracture through left T1 transverse process; resolution of manubrial hypermetabolic activity; no evidence of extraosseous metastatic disease. I reviewed the results with them today. ?-She has had a mild disease progression, but overall asymptomatic.  I discussed treatment options. I discussed we can either try to increase her Ibrance back to 100 mg or change her anastrozole to fulvestrant injections. Given she lives a little far away, and minimal disease progression, we will try increasing her Ibrance. ?-She continues to tolerate treatment well. Labs from 03/26/22 reviewed, CBC continues to show some mild pancytopenia, otherwise labs are overall stable. Most recent CA 27.29 on 01/22/22 was trending up to 62.8. ?-Continue Anastrozole daily and increase Ibrance to 100 mg daily 2 weeks on/1 week off from next cycle  ?  ?2. Diffuse bone metastasis, L1/L3 fracture, hypercalcemia ?-S/p palliative RT with Dr MooLisbeth Renshaw12-12/21/20 for severe left hip pain. ?-She received one dose of Zometa on 11/29/20 for hypercalcinemia, but given her CKD this has been held. Her calcium has been WNL to low since Zometa injection. ?-PET on 10/13/21 showed overall stable osseous metastatic disease compared to 03/2021. ?-she does not have much, if any, pain. ?  ?  3. Chronic comorbidities: Pre-DM, CKD stage III, history of CVA, HL, osteopenia ?-On amlodipine, clopidogrel, hydralazine, aspirin, furosemide, lisinopril ?-She has had  osteoporosis as seen on 2016 and 08/2017 DEXA. Her 08/2020 DEXA improved to osteopenia.  ?-Continue to F/u with PCP Dr. Billey Gosling ?  ?4. Anemia, Pancytopenia  ?-Initially mild anemia in 08/2020. This is likely related to bone mets.  ?-Continue oral iron. Hgb 9.1 today (01/01/22) ?-She has pancytopenia from [place]. Plt 146k today. ?  ?5. Possible COVID in 11/2021 ?-she had URI symptoms and COVID exposure but home test was negative ?-she had slow recovery, but feels much better now, still has dry cough  ?  ?  ?PLAN:  ?-Continue Anastrozole daily  ?-increase Ibrance to 153m, continue 2 weeks on/1 week off, starting on 5/22.  She will complete current cycle with 75 mg daily.  I refilled today  ?-lab and f/u on 6/16 ? ? ?No problem-specific Assessment & Plan notes found for this encounter. ? ? ?SUMMARY OF ONCOLOGIC HISTORY: ?Oncology History Overview Note  ? Cancer Staging  ?Malignant neoplasm of upper-inner quadrant of left breast in female, estrogen receptor positive ([place] ?Staging form: Breast, AJCC 8th Edition ?- Clinical stage from 06/18/2017: Stage IB (cT2, cN0, cM0, G2, ER+, PR+, HER2-) - Signed by FTruitt Merle MD on 06/24/2017 ?Histologic grading system: 3 grade system ?- Pathologic stage from 07/30/2017: Stage IA (pT2, pN0, cM0, G2, ER+, PR+, HER2-, Oncotype DX score: 23) - Signed by FTruitt Merle MD on 08/23/2017 ?Neoadjuvant therapy: No ?Nuclear grade: G2 ?Multigene prognostic tests performed: Oncotype DX ?Recurrence score range: Greater than or equal to 11 ?Histologic grading system: 3 grade system ?Residual tumor (R): R0 - None ?Laterality: Left ? ? ?  ?Malignant neoplasm of upper-inner quadrant of left breast in female, estrogen receptor positive ([place]  ?06/17/2017 Mammogram  ? [place] diagnostic Breast Tomo Bilateral  ?IMPRESSION: ?1. 3.4 palpable mass in the 10 o'clock position of the left breast is highly suspicious for malignancy. ? 2. No evidence of malignancy in the right breast. ? 3. Ultrasound of the left  axilla shows normal sized axillary lymph ?nodes. ?  ?06/18/2017 Initial Biopsy  ? Diagnosis 06/18/17 ?Breast, left, needle core biopsy, 10:00 o'clock ?- INVASIVE MAMMARY CARCINOMA. At least G2 ? ?The malignant cells are negative for E-Cadherin, supporting a lobular phenotype. ? ?  ?06/18/2017 Initial Diagnosis  ? Malignant neoplasm of upper-inner quadrant of left breast in [place], estrogen receptor positive ([place] ? ?  ?06/18/2017 Receptors her2  ? ER 95%+, PR 25%+, both strong staining  ?HER2- ?Ki67 30% ? ? ?  ?07/30/2017 Surgery  ? LEFT TOTAL MASTECTOMY WITH AXILLARY SENTINEL LYMPH NODE BIOPSY by Dr. WDonne Hazelon 07/30/17 ? ?  ?07/30/2017 Pathology Results  ? Diagnosis 07/30/17 ?1. Breast, simple mastectomy, Left Total ?- INVASIVE LOBULAR CARCINOMA, GRADE 2, SPANNING 3.5 CM. ?- LOBULAR CARCINOMA IN SITU. ?- INVASIVE CARCINOMA IS BROADLY 0.1 CM FROM POSTERIOR MARGIN. ?- PERINEURAL INVASION PRESENT. ?- SEE ONCOLOGY TABLE. ?2. Lymph node, sentinel, biopsy, Left Axillary ?- ONE OF ONE LYMPH NODES NEGATIVE FOR CARCINOMA (0/1). ?3. Lymph node, sentinel, biopsy, Left ?- ONE OF ONE LYMPH NODES NEGATIVE FOR CARCINOMA (0/1). ? ?  ?07/30/2017 Oncotype testing  ? Oncotype 07/30/17 ?Recurrence score is 23, an intermediate risk ?With a 10-year risk of recurrence at 15% with tamoxifen alone.  ? ? ?  ?11/11/2020 Imaging  ? MRI Lumbar Spine  ?IMPRESSION: ?Diffuse osseous metastases. Chronic inferior L2 endplate deformity ?with mild height loss. ?  ?  Likely late subacute pathologic fracture deformities involving the ?superior L1 endplate and right L3 lamina. ?  ?Multilevel spondylosis. Moderate to severe spinal canal and neural ?foraminal narrowing at the L3-4 and L4-5 levels. ?  ?Mild to moderate spinal canal and neural foraminal narrowing at the ?L5-S1 level. ?  ?11/28/2020 PET scan  ? IMPRESSION: ?1. Widespread and diffuse hypermetabolic bony metastases. ?2. No evidence for soft tissue metastases in the neck, chest, ?abdomen, or pelvis. ?3.  Aortic  Atherosclerois (ICD10-170.0) ?  ?  ?11/29/2020 -  Chemotherapy  ? Zometa q53month starting 11/29/20. Held after first dose. Given her CKD will only give when she has hypercalcemia and may switch to Prolia.  ? ?  ?

## 2022-04-02 ENCOUNTER — Telehealth: Payer: Self-pay | Admitting: Hematology

## 2022-04-02 NOTE — Telephone Encounter (Signed)
Scheduled follow-up appointment per 5/5 los. Patient is aware. ?

## 2022-04-24 ENCOUNTER — Other Ambulatory Visit: Payer: Self-pay | Admitting: Hematology

## 2022-04-25 ENCOUNTER — Telehealth: Payer: Self-pay

## 2022-04-25 NOTE — Telephone Encounter (Signed)
This nurse returned a call to this patient related to message that she left about not being able to get a refill on her Anastrazole.  This nurse contacted [facility] and was informed that patient picked up a 90 day supply on 03/15/22 and the insurance will not cover a refill sooner than July.  This nurse reached out to patient and made her aware and she stated that she check around her home because she remembers them giving her 3 bottles but she thought she had put them all in one and she has taken all that was in the bottle.  This nurse advised her to check and patient states that she will call back if she cannot find them.  No further questions or concerns at this time.

## 2022-05-11 ENCOUNTER — Telehealth: Payer: Self-pay

## 2022-05-11 ENCOUNTER — Inpatient Hospital Stay: Payer: Medicare PPO

## 2022-05-11 ENCOUNTER — Encounter: Payer: Self-pay | Admitting: Hematology

## 2022-05-11 ENCOUNTER — Inpatient Hospital Stay: Payer: Medicare PPO | Admitting: Hematology

## 2022-05-11 ENCOUNTER — Other Ambulatory Visit: Payer: Self-pay

## 2022-05-11 ENCOUNTER — Inpatient Hospital Stay: Payer: Medicare PPO | Attending: Hematology

## 2022-05-11 VITALS — HR 59 | Temp 98.0°F | Resp 18 | Ht 62.0 in | Wt 142.4 lb

## 2022-05-11 DIAGNOSIS — Z170 Estrogen receptor positive status [ER+]: Secondary | ICD-10-CM

## 2022-05-11 DIAGNOSIS — D61818 Other pancytopenia: Secondary | ICD-10-CM | POA: Insufficient documentation

## 2022-05-11 DIAGNOSIS — C7951 Secondary malignant neoplasm of bone: Secondary | ICD-10-CM | POA: Diagnosis not present

## 2022-05-11 DIAGNOSIS — C50212 Malignant neoplasm of upper-inner quadrant of left female breast: Secondary | ICD-10-CM

## 2022-05-11 DIAGNOSIS — D649 Anemia, unspecified: Secondary | ICD-10-CM | POA: Diagnosis not present

## 2022-05-11 DIAGNOSIS — N183 Chronic kidney disease, stage 3 unspecified: Secondary | ICD-10-CM | POA: Diagnosis not present

## 2022-05-11 DIAGNOSIS — I129 Hypertensive chronic kidney disease with stage 1 through stage 4 chronic kidney disease, or unspecified chronic kidney disease: Secondary | ICD-10-CM | POA: Diagnosis not present

## 2022-05-11 LAB — CBC WITH DIFFERENTIAL/PLATELET
Abs Immature Granulocytes: 0 10*3/uL (ref 0.00–0.07)
Basophils Absolute: 0 10*3/uL (ref 0.0–0.1)
Basophils Relative: 2 %
Eosinophils Absolute: 0.1 10*3/uL (ref 0.0–0.5)
Eosinophils Relative: 3 %
HCT: 28.3 % — ABNORMAL LOW (ref 36.0–46.0)
Hemoglobin: 9.8 g/dL — ABNORMAL LOW (ref 12.0–15.0)
Immature Granulocytes: 0 %
Lymphocytes Relative: 46 %
Lymphs Abs: 0.9 10*3/uL (ref 0.7–4.0)
MCH: 39.8 pg — ABNORMAL HIGH (ref 26.0–34.0)
MCHC: 34.6 g/dL (ref 30.0–36.0)
MCV: 115 fL — ABNORMAL HIGH (ref 80.0–100.0)
Monocytes Absolute: 0.2 10*3/uL (ref 0.1–1.0)
Monocytes Relative: 9 %
Neutro Abs: 0.8 10*3/uL — ABNORMAL LOW (ref 1.7–7.7)
Neutrophils Relative %: 40 %
Platelets: 88 10*3/uL — ABNORMAL LOW (ref 150–400)
RBC: 2.46 MIL/uL — ABNORMAL LOW (ref 3.87–5.11)
RDW: 13.8 % (ref 11.5–15.5)
Smear Review: NORMAL
WBC: 1.9 10*3/uL — ABNORMAL LOW (ref 4.0–10.5)
nRBC: 0 % (ref 0.0–0.2)

## 2022-05-11 LAB — COMPREHENSIVE METABOLIC PANEL
ALT: 11 U/L (ref 0–44)
AST: 20 U/L (ref 15–41)
Albumin: 3.9 g/dL (ref 3.5–5.0)
Alkaline Phosphatase: 122 U/L (ref 38–126)
Anion gap: 8 (ref 5–15)
BUN: 39 mg/dL — ABNORMAL HIGH (ref 8–23)
CO2: 25 mmol/L (ref 22–32)
Calcium: 9.9 mg/dL (ref 8.9–10.3)
Chloride: 111 mmol/L (ref 98–111)
Creatinine, Ser: 1.51 mg/dL — ABNORMAL HIGH (ref 0.44–1.00)
GFR, Estimated: 35 mL/min — ABNORMAL LOW (ref >60)
Glucose, Bld: 91 mg/dL (ref 70–99)
Potassium: 4.5 mmol/L (ref 3.5–5.1)
Sodium: 144 mmol/L (ref 135–145)
Total Bilirubin: 0.5 mg/dL (ref 0.3–1.2)
Total Protein: 7.3 g/dL (ref 6.5–8.1)

## 2022-05-11 NOTE — Telephone Encounter (Signed)
This nurse reached out to patient per providers request to ask patient if she is willing to move her 1pm appointment to 320 pm today.  Patient asked if lab appointment can be moved as well.   This nurse advised that lab appointment will be moved to 230 pm.  Patient is in agreement.  No further questions or concerns at this time.

## 2022-05-11 NOTE — Progress Notes (Addendum)
Shirley Wells   [phone] [phone]   Clinic Follow up Note   Patient Care Team: Binnie Rail, MD as PCP - General (Internal Medicine) Garvin Fila, MD as Consulting Physician (Neurology) Rolm Bookbinder, MD as Consulting Physician (General Surgery) Truitt Merle, MD as Consulting Physician (Hematology) Kyung Rudd, MD as Consulting Physician (Radiation Oncology)  Date of Service:  05/11/2022  CHIEF COMPLAINT: f/u of metastatic breast cancer  CURRENT THERAPY:  -Zometa, starting 11/29/20, currently intermittent due to CKD -Anastrozole, starting 11/2020 Shirley Wells, starting 01/09/21, currently 75 mg days 1-14 q21d, plan to increase to 185m day 1-14 every 21 days   ASSESSMENT & PLAN:  Shirley SALBERGis a 81y.o. female with   1. 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 S/p left total mastectomy. She declined antiestrogen therapy due to concerns of side effects. She lost follow up after 07/2017.   -After 6 months of left hip and ribcage pain she was found to have diffuse bone metastasis on 11/28/20 PET. This was confirmed on 12/09/20 bone biopsy which showed metastatic carcinoma compatible from prior breast cancer, ER 50%+, PR 15% positive and HER2 negative. -she started first-line Anastrozole in 11/2020 and Ibrance 1076m3 weeks on/1 week off starting 01/09/21. Reduced to 7541mtarting with C2 and reduced to 2 weeks on/1 week off starting with C3 on 03/13/21. She is tolerating better.  -restaging PET from 03/26/22 showed: interval increase in metabolic activity within L2 vertebral body; new activity associated with nondisplaced pathologic fracture through left T1 transverse process; resolution of manubrial hypermetabolic activity; no evidence of extraosseous metastatic disease.  -we  increased her Ibrance to 100 mg on 04/16/22. She reports more fatigue and asked about decreasing back to 75 mg. We again discussed her mild disease progression, and I reviewed her multiple PET imaging with them again today. I discussed that the 75 mg was not controlling her disease. I again discussed the option of switching her anastrozole to fulvestrant injections, but she declines. She would like to keep trying the 100 mg. -labs reviewed, CBC is decreased on higher dose Ibrance. -f/u in 3 weeks before next cycle.   2. Diffuse bone metastasis, L1/L3 fracture, hypercalcemia -S/p palliative RT with Dr MooLisbeth Renshaw12-12/21/20 for severe left hip pain. -She received one dose of Zometa on 11/29/20 for hypercalcinemia, but given her CKD this has been held. Her calcium has been WNL to low since Zometa injection. -PET on 10/13/21 showed overall stable osseous metastatic disease compared to 03/2021. -she does not have much, if any, pain.   3. Chronic comorbidities: Pre-DM, CKD stage III, history of CVA, HL, osteopenia -On amlodipine, clopidogrel, hydralazine, aspirin, furosemide, lisinopril -She has had osteoporosis as seen on 2016 and 08/2017 DEXA. Her 08/2020 DEXA improved to osteopenia.  -Continue to F/u with PCP Dr. StaBilley Gosling4. Anemia, Pancytopenia  -Initially mild anemia in 08/2020. This is likely related to bone mets.  -Continue oral iron. Hgb 9.8 today (05/11/22) -She has pancytopenia from IbrEphesusorse today due to increased Ibrance dose. Plt 88k today. -If she has worsening cytopenias, we may have to cut her Ibrance dose back or change to [employer]   PLAN:  -Continue Anastrozole daily and Ibrance 100m30m weeks on/1 week off -lab and f/u in 4 weeks   No problem-specific Assessment &  Plan notes found for this encounter.   SUMMARY OF ONCOLOGIC HISTORY: Oncology History Overview Note   Cancer Staging  Malignant neoplasm of upper-inner quadrant of left breast in female, estrogen receptor  positive ([place]) Staging form: Breast, AJCC 8th Edition - Clinical stage from 06/18/2017: Stage IB (cT2, cN0, cM0, G2, ER+, PR+, HER2-) - Signed by Truitt Merle, MD on 06/24/2017 Histologic grading system: 3 grade system - Pathologic stage from 07/30/2017: Stage IA (pT2, pN0, cM0, G2, ER+, PR+, HER2-, Oncotype DX score: 23) - Signed by Truitt Merle, MD on 08/23/2017 Neoadjuvant therapy: No Nuclear grade: G2 Multigene prognostic tests performed: Oncotype DX Recurrence score range: Greater than or equal to 11 Histologic grading system: 3 grade system Residual tumor (R): R0 - None Laterality: Left     Malignant neoplasm of upper-inner quadrant of left breast in female, estrogen receptor positive (Shirley Wells)  06/17/2017 Mammogram   [place] and [facility] diagnostic Breast Tomo Bilateral  IMPRESSION: 1. 3.4 palpable mass in the 10 o'clock position of the left breast is highly suspicious for malignancy.  2. No evidence of malignancy in the right breast.  3. Ultrasound of the left axilla shows normal sized axillary lymph nodes.   06/18/2017 Initial Biopsy   Diagnosis 06/18/17 Breast, left, needle core biopsy, 10:00 o'clock - INVASIVE MAMMARY CARCINOMA. At least G2  The malignant cells are negative for E-Cadherin, supporting a lobular phenotype.   06/18/2017 Initial Diagnosis   Malignant neoplasm of upper-inner quadrant of left breast in female, estrogen receptor positive ([place])   06/18/2017 Receptors her2   ER 95%+, PR 25%+, both strong staining  HER2- Ki67 30%    07/30/2017 Surgery   LEFT TOTAL MASTECTOMY WITH AXILLARY SENTINEL LYMPH NODE BIOPSY by Dr. Donne Hazel on 07/30/17   07/30/2017 Pathology Results   Diagnosis 07/30/17 1. Breast, simple mastectomy, Left Total - INVASIVE LOBULAR CARCINOMA, GRADE 2, SPANNING 3.5 CM. - LOBULAR CARCINOMA IN SITU. - INVASIVE CARCINOMA IS BROADLY 0.1 CM FROM POSTERIOR MARGIN. - PERINEURAL INVASION PRESENT. - SEE ONCOLOGY TABLE. 2. Lymph node, sentinel, biopsy, Left Axillary - ONE OF  ONE LYMPH NODES NEGATIVE FOR CARCINOMA (0/1). 3. Lymph node, sentinel, biopsy, Left - ONE OF ONE LYMPH NODES NEGATIVE FOR CARCINOMA (0/1).   07/30/2017 Oncotype testing   Oncotype 07/30/17 Recurrence score is 23, an intermediate risk With a 10-year risk of recurrence at 15% with tamoxifen alone.     11/11/2020 Imaging   MRI Lumbar Spine  IMPRESSION: Diffuse osseous metastases. Chronic inferior L2 endplate deformity with mild height loss.   Likely late subacute pathologic fracture deformities involving the superior L1 endplate and right L3 lamina.   Multilevel spondylosis. Moderate to severe spinal canal and neural foraminal narrowing at the L3-4 and L4-5 levels.   Mild to moderate spinal canal and neural foraminal narrowing at the L5-S1 level.   11/28/2020 PET scan   IMPRESSION: 1. Widespread and diffuse hypermetabolic bony metastases. 2. No evidence for soft tissue metastases in the neck, chest, abdomen, or pelvis. 3.  Aortic Atherosclerois (ICD10-170.0)     11/29/2020 -  Chemotherapy   Zometa q60month starting 11/29/20. Held after first dose. Given her CKD will only give when she has hypercalcemia and may switch to Prolia.     12/04/2020 Imaging   MRI Brain  IMPRESSION: No evidence of intracranial metastatic disease.   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   Mild chronic microvascular ischemic changes. Small chronic right cerebellar infarct.     12/07/2020 - 12/21/2020 Radiation Therapy  palliative RT to left hip with Dr Lisbeth Renshaw on 12/07/20-12/21/20   12/09/2020 Pathology Results   FINAL MICROSCOPIC DIAGNOSIS:   A. BONE, BIOPSY:  - Metastatic carcinoma.  - See comment.   COMMENT:  The morphology is compatible with metastatic lobular breast carcinoma.  Estrogen receptor, progesterone receptor and HER2/neu will be performed.   ADDENDUM:   PROGNOSTIC INDICATOR RESULTS:   Immunohistochemical and morphometric analysis performed manually   The tumor  cells are EQUIVOCAL for Her2 (2+).  Her2 by FISH will be performed and the results will be reported  separately.   Estrogen Receptor:       POSITIVE, 50%, MODERATE-STRONG STAINING  Progesterone Receptor:   POSITIVE, 15%, MODERATE-STRONG STAINING    11/2020 -  Anti-estrogen oral therapy   First-line Anti-estrogen therapy Anastrozole 73m daily starting in 11/2020 and Ibrance starting in 12/2020    01/09/2021 -  Chemotherapy   Ibrance 3 weeks on/1 weeks off starting beginning of 01/09/21. Reduced to 7105mstarting with C2 (02/13/21) due to cytopenia and fatigue. Reduced to 7533m weeks on/1 week off starting C3 on 03/13/21)    03/31/2021 PET scan   IMPRESSION: 1. Dramatic reduction in metabolic activity throughout the skeleton. Metabolic activity of diffuse sclerotic skeletal lesions is now essentially at background blood pool level. No focal metabolic activity. 2. Persistent diffuse confluent sclerotic skeletal metastasis unchanged. 3. No evidence of soft tissue breast cancer metastasis.   10/13/2021 PET scan   IMPRESSION: 1. Widespread osseous metastatic disease again noted with areas of minimally increased metabolic activity within the sternal manubrium and L2 vertebral body. 2. No evidence of extra osseous metastatic disease.      INTERVAL HISTORY:  Shirley Wells here for a follow up of metastatic breast cancer. She was last seen by me on 03/30/22. She presents to the clinic accompanied by her daughter. She reports she is doing okay overall. She reports she is having more fatigue on the higher dose of Ibrance.   All other systems were reviewed with the patient and are negative.  MEDICAL HISTORY:  Past Medical History:  Diagnosis Date   CHICKENPOX, HX OF 03/21/2010   Qualifier: Diagnosis of  By: BraMarca AnconaA, Lucy     CVA (cerebral infarction) 02/2010   no residual deficits, a/w mid basal art stenosis, declined IC stent as rec by IR/neuro   Dyslipidemia    intol of statin due to >3x  increase LFTs   GERD (gastroesophageal reflux disease)    hx   Hypertension, essential    Malignant neoplasm of upper-inner quadrant of left breast in female, estrogen receptor positive ([facility]/26/[facility]   Osteoarthritis    [facility]   SURGICAL HISTORY: Past Surgical History:  Procedure Laterality Date   CHOLECYSTECTOMY  1981   MASTECTOMY W/ SENTINEL NODE BIOPSY Left 07/30/2017   Procedure: LEFT TOTAL MASTECTOMY WITH AXILLARY SENTINEL LYMPH NODE BIOPSY;  Surgeon: WakRolm BookbinderD;  [facility];  Laterality: Left;    I have reviewed the social history and family history with the patient and they are unchanged from previous note.  ALLERGIES:  is allergic to influenza vaccines, pneumococcal vaccines, simvastatin, tetanus toxoids, and zoster vaccine live.  MEDICATIONS:  Current Outpatient Medications  Medication Sig Dispense Refill   acetaminophen (TYLENOL) 650 MG CR tablet Take 1,300 mg by mouth every 8 (eight) hours as needed for pain.     anastrozole (ARIMIDEX) 1 MG tablet TAKE 1 TABLET(1 MG) BY MOUTH DAILY 90  tablet 3   aspirin 81 MG tablet Take 81 mg by mouth 2 (two) times a week.     clopidogrel (PLAVIX) 75 MG tablet TAKE 1 TABLET(75 MG) BY MOUTH DAILY 90 tablet 1   furosemide (LASIX) 20 MG tablet Take 20 mg by mouth daily as needed for fluid.     lisinopril (ZESTRIL) 10 MG tablet TAKE 1 TABLET(10 MG) BY MOUTH DAILY 90 tablet 1   loratadine (CLARITIN) 10 MG tablet Take 10 mg by mouth daily as needed for allergies.     Menthol, Topical Analgesic, (BIOFREEZE EX) Apply 1 application topically daily as needed (muscle pain).     Multiple Vitamin (MULTIVITAMIN) capsule Take 1 capsule by mouth daily.     palbociclib (IBRANCE) 100 MG capsule Take 1 capsule (100 mg total) by mouth daily with breakfast. Take whole with food. Take for 14 days on, 7 days off, repeat every 21 days. 14 capsule 1   No current facility-administered medications for this visit.     PHYSICAL EXAMINATION: ECOG PERFORMANCE STATUS: 1 - Symptomatic but completely ambulatory  Vitals:   05/11/22 1525  Pulse: (!) 59  Resp: 18  Temp: 98 F (36.7 C)  SpO2: 97%   Wt Readings from Last 3 Encounters:  05/11/22 142 lb 6.4 oz (64.6 kg)  03/19/22 139 lb 6.4 oz (63.2 kg)  01/01/22 133 lb (60.3 kg)    GENERAL:alert, no distress and comfortable SKIN: skin color normal, no rashes or significant lesions EYES: normal, Conjunctiva are pink and non-injected, sclera clear  NEURO: alert & oriented x 3 with fluent speech  LABORATORY DATA:  I have reviewed the data as listed    Latest Ref Rng & Units 05/11/2022    2:43 PM 03/26/2022    9:31 AM 01/22/2022    1:19 PM  CBC  WBC 4.0 - 10.5 K/uL 1.9  2.5  3.4   Hemoglobin 12.0 - 15.0 g/dL 9.8  10.7  10.0   Hematocrit 36.0 - 46.0 % 28.3  32.0  29.2   Platelets 150 - 400 K/uL 88  108  184         Latest Ref Rng & Units 05/11/2022    2:43 PM 03/26/2022    9:31 AM 01/22/2022    1:19 PM  CMP  Glucose 70 - 99 mg/dL 91  91  148   BUN 8 - 23 mg/dL 39  41  40   Creatinine 0.44 - 1.00 mg/dL 1.51  1.51  1.56   Sodium 135 - 145 mmol/L 144  143  141   Potassium 3.5 - 5.1 mmol/L 4.5  4.4  4.2   Chloride 98 - 111 mmol/L 111  111  108   CO2 22 - 32 mmol/L _0 Calcium 8.9 - 10.3 mg/dL 9.9  9.6  9.6   Total Protein 6.5 - 8.1 g/dL 7.3  7.4  7.4   Total Bilirubin 0.3 - 1.2 mg/dL 0.5  0.3  0.4   Alkaline Phos 38 - 126 U/L 122  111  114   AST 15 - 41 U/L _1 ALT 0 - 44 U/L _2 RADIOGRAPHIC STUDIES: I have personally reviewed the radiological images as listed and agreed with the findings in the report. No results found.    No orders of the defined types were placed in this encounter.  All questions were answered. The patient knows  to call the clinic with any problems, questions or concerns. No barriers to learning was detected. The total time spent in the appointment was 30 minutes.     Truitt Merle,  MD 05/11/2022   I, Wilburn Mylar, am acting as scribe for Truitt Merle, MD.   I have reviewed the above documentation for accuracy and completeness, and I agree with the above.

## 2022-05-12 LAB — CANCER ANTIGEN 27.29: CA 27.29: 54.8 U/mL — ABNORMAL HIGH (ref 0.0–38.6)

## 2022-05-18 ENCOUNTER — Other Ambulatory Visit: Payer: Self-pay

## 2022-05-18 MED ORDER — PALBOCICLIB 100 MG PO CAPS: 100.0000 mg | ORAL_CAPSULE | Freq: Every day | ORAL | 1 refills | Status: DC

## 2022-05-18 NOTE — Progress Notes (Signed)
Refill prescription for Ibrance 100mg  sent to [facility].  Spoke with pt via telephone and pt confirmed that she's taking 100mg  PO daily for 14 days and then off for 7 days.

## 2022-05-23 ENCOUNTER — Other Ambulatory Visit: Payer: Self-pay

## 2022-05-25 ENCOUNTER — Encounter: Payer: Self-pay | Admitting: Hematology

## 2022-05-28 ENCOUNTER — Encounter: Payer: Self-pay | Admitting: Hematology

## 2022-06-01 ENCOUNTER — Encounter: Payer: Self-pay | Admitting: Hematology

## 2022-06-03 NOTE — Progress Notes (Unsigned)
[place]   [phone]) [phone] Fax:(336) [phone]   Clinic Follow up Note   Patient Care Team: Binnie Rail, MD as PCP - General (Internal Medicine) Garvin Fila, MD as Consulting Physician (Neurology) Rolm Bookbinder, MD as Consulting Physician (General Surgery) Truitt Merle, MD as Consulting Physician (Hematology) Kyung Rudd, MD as Consulting Physician (Radiation Oncology)  Date of Service:  06/03/2022  CHIEF COMPLAINT: f/u of metastatic breast cancer  CURRENT THERAPY:  -Zometa, starting 11/29/20, currently intermittent due to CKD -Anastrozole, starting 11/2020 Shirley Wells, starting 01/09/21, currently 75 mg days 1-14 q21d, increased to 165m day 1-14 every 21 days in May 2023   ASSESSMENT & PLAN:  Shirley KANGASis a 81y.o. female with   1. 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 S/p left total mastectomy. She declined antiestrogen therapy due to concerns of side effects. She lost follow up after 07/2017.   -After 6 months of left hip and ribcage pain she was found to have diffuse bone metastasis on 11/28/20 PET. This was confirmed on 12/09/20 bone biopsy which showed metastatic carcinoma compatible from prior breast cancer, ER 50%+, PR 15% positive and HER2 negative. -she started first-line Anastrozole in 11/2020 and Ibrance 1024m3 weeks on/1 week off starting 01/09/21. Reduced to 7544mtarting with C2 and reduced to 2 weeks on/1 week off starting with C3 on 03/13/21. She is tolerating better.  -restaging PET from 03/26/22 showed: interval increase in metabolic activity within L2 vertebral body; new activity associated with nondisplaced pathologic fracture through left T1 transverse process; resolution of manubrial hypermetabolic activity; no evidence of extraosseous metastatic disease.  -we  increased her Ibrance to 100 mg on 04/16/22. She reports more fatigue.  -   2. Diffuse bone metastasis, L1/L3 fracture, hypercalcemia -S/p palliative RT with Dr MooLisbeth Renshaw12-12/21/20 for severe left hip pain. -She received one dose of Zometa on 11/29/20 for hypercalcinemia, but given her CKD this has been held. Her calcium has been WNL to low since Zometa injection. -PET on 10/13/21 showed overall stable osseous metastatic disease compared to 03/2021. -she does not have much, if any, pain.   3. Chronic comorbidities: Pre-DM, CKD stage III, history of CVA, HL, osteopenia -On amlodipine, clopidogrel, hydralazine, aspirin, furosemide, lisinopril -She has had osteoporosis as seen on 2016 and 08/2017 DEXA. Her 08/2020 DEXA improved to osteopenia.  -Continue to F/u with PCP Dr. StaBilley Gosling4. Pancytopenia  -Initially mild anemia in 08/2020. This is likely related to bone mets and Verzenio      PLAN:  -Continue Anastrozole daily and Ibrance 100m64m weeks on/1 week off -lab and f/u in 4 weeks   No problem-specific Assessment & Plan notes found for this encounter.   SUMMARY OF ONCOLOGIC HISTORY: Oncology History Overview Note   Cancer Staging  Malignant neoplasm of upper-inner quadrant of left breast in female, estrogen receptor positive (HCC)[place]: Breast, AJCC 8th Edition - Clinical stage from 06/18/2017: Stage IB (cT2, cN0, cM0, G2, ER+, PR+, HER2-) - Signed by FengTruitt Merle on 06/24/2017 Histologic grading system: 3 grade system - Pathologic stage from 07/30/2017: Stage IA (pT2, pN0, cM0, G2, ER+, PR+, HER2-, Oncotype DX score: 23) - Signed by FengTruitt Merle on 08/23/2017 Neoadjuvant therapy: No Nuclear grade: G2 Multigene prognostic tests performed: Oncotype DX Recurrence score range: Greater than or  equal to 11 Histologic grading system: 3 grade system Residual tumor (R): R0 - None Laterality: Left     Malignant neoplasm of upper-inner quadrant of left breast in female,  estrogen receptor positive ([place])  06/17/2017 Mammogram   [place] and MM diagnostic Breast Tomo Bilateral  IMPRESSION: 1. 3.4 palpable mass in the 10 o'clock position of the left breast is highly suspicious for malignancy.  2. No evidence of malignancy in the right breast.  3. Ultrasound of the left axilla shows normal sized axillary lymph nodes.   06/18/2017 Initial Biopsy   Diagnosis 06/18/17 Breast, left, needle core biopsy, 10:00 o'clock - INVASIVE MAMMARY CARCINOMA. At least G2  The malignant cells are negative for E-Cadherin, supporting a lobular phenotype.   06/18/2017 Initial Diagnosis   Malignant neoplasm of upper-inner quadrant of left breast in female, estrogen receptor positive ([place])   06/18/2017 Receptors her2   ER 95%+, PR 25%+, both strong staining  HER2- Ki67 30%    07/30/2017 Surgery   LEFT TOTAL MASTECTOMY WITH AXILLARY SENTINEL LYMPH NODE BIOPSY by Dr. Donne Hazel on 07/30/17   07/30/2017 Pathology Results   Diagnosis 07/30/17 1. Breast, simple mastectomy, Left Total - INVASIVE LOBULAR CARCINOMA, GRADE 2, SPANNING 3.5 CM. - LOBULAR CARCINOMA IN SITU. - INVASIVE CARCINOMA IS BROADLY 0.1 CM FROM POSTERIOR MARGIN. - PERINEURAL INVASION PRESENT. - SEE ONCOLOGY TABLE. 2. Lymph node, sentinel, biopsy, Left Axillary - ONE OF ONE LYMPH NODES NEGATIVE FOR CARCINOMA (0/1). 3. Lymph node, sentinel, biopsy, Left - ONE OF ONE LYMPH NODES NEGATIVE FOR CARCINOMA (0/1).   07/30/2017 Oncotype testing   Oncotype 07/30/17 Recurrence score is 23, an intermediate risk With a 10-year risk of recurrence at 15% with tamoxifen alone.     11/11/2020 Imaging   MRI Lumbar Spine  IMPRESSION: Diffuse osseous metastases. Chronic inferior L2 endplate deformity with mild height loss.   Likely late subacute pathologic fracture deformities involving the superior L1 endplate and right L3 lamina.   Multilevel spondylosis. Moderate to severe spinal canal and neural foraminal narrowing at the L3-4 and  L4-5 levels.   Mild to moderate spinal canal and neural foraminal narrowing at the L5-S1 level.   11/28/2020 PET scan   IMPRESSION: 1. Widespread and diffuse hypermetabolic bony metastases. 2. No evidence for soft tissue metastases in the neck, chest, abdomen, or pelvis. 3.  Aortic Atherosclerois (ICD10-170.0)     11/29/2020 -  Chemotherapy   Zometa q86month starting 11/29/20. Held after first dose. Given her CKD will only give when she has hypercalcemia and may switch to Prolia.     12/04/2020 Imaging   MRI Brain  IMPRESSION: No evidence of intracranial metastatic disease.   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   Mild chronic microvascular ischemic changes. Small chronic right cerebellar infarct.     12/07/2020 - 12/21/2020 Radiation Therapy   palliative RT to left hip with Dr MLisbeth Renshawon 12/07/20-12/21/20   12/09/2020 Pathology Results   FINAL MICROSCOPIC DIAGNOSIS:   A. BONE, BIOPSY:  - Metastatic carcinoma.  - See comment.   COMMENT:  The morphology is compatible with metastatic lobular breast carcinoma.  Estrogen receptor, progesterone receptor and HER2/neu will be performed.   ADDENDUM:   PROGNOSTIC INDICATOR RESULTS:   Immunohistochemical and morphometric analysis performed manually   The tumor cells are EQUIVOCAL for Her2 (2+).  Her2 by FISH will be performed and the results will be reported  separately.   Estrogen Receptor:       POSITIVE, 50%, MODERATE-STRONG STAINING  Progesterone Receptor:   POSITIVE, 15%, MODERATE-STRONG STAINING    11/2020 -  Anti-estrogen oral therapy   First-line Anti-estrogen therapy Anastrozole 37m daily starting in 11/2020 and Ibrance starting in 12/2020    01/09/2021 -  Chemotherapy   Ibrance 3 weeks on/1 weeks off starting beginning of 01/09/21. Reduced to 778mstarting with C2 (02/13/21) due to cytopenia and fatigue. Reduced to 7552m weeks on/1 week off starting C3 on 03/13/21)    03/31/2021 PET scan   IMPRESSION: 1.  Dramatic reduction in metabolic activity throughout the skeleton. Metabolic activity of diffuse sclerotic skeletal lesions is now essentially at background blood pool level. No focal metabolic activity. 2. Persistent diffuse confluent sclerotic skeletal metastasis unchanged. 3. No evidence of soft tissue breast cancer metastasis.   10/13/2021 PET scan   IMPRESSION: 1. Widespread osseous metastatic disease again noted with areas of minimally increased metabolic activity within the sternal manubrium and L2 vertebral body. 2. No evidence of extra osseous metastatic disease.      INTERVAL HISTORY:  Shirley Wells here for a follow up of metastatic breast cancer. She was last seen by me on 03/30/22. She presents to the clinic accompanied by her daughter. She reports she is doing okay overall. She reports she is having more fatigue on the higher dose of Ibrance.   All other systems were reviewed with the patient and are negative.  MEDICAL HISTORY:  Past Medical History:  Diagnosis Date   CHICKENPOX, HX OF 03/21/2010   Qualifier: Diagnosis of  By: BraMarca AnconaA, Lucy     CVA (cerebral infarction) 02/2010   no residual deficits, a/w mid basal art stenosis, declined IC stent as rec by IR/neuro   Dyslipidemia    intol of statin due to >3x increase LFTs   GERD (gastroesophageal reflux disease)    hx   Hypertension, essential    Malignant neoplasm of upper-inner quadrant of left breast in female, estrogen receptor positive ([place]/26/2018   Osteoarthritis    [facility]   SURGICAL HISTORY: Past Surgical History:  Procedure Laterality Date   CHOLECYSTECTOMY  1981   MASTECTOMY W/ SENTINEL NODE BIOPSY Left 07/30/2017   Procedure: LEFT TOTAL MASTECTOMY WITH AXILLARY SENTINEL LYMPH NODE BIOPSY;  Surgeon: WakRolm BookbinderD;  [facility]  Laterality: Left;    I have reviewed the social history and family history with the patient and they are unchanged from previous  note.  ALLERGIES:  is allergic to influenza vaccines, pneumococcal vaccines, simvastatin, tetanus toxoids, and zoster vaccine live.  MEDICATIONS:  Current Outpatient Medications  Medication Sig Dispense Refill   acetaminophen (TYLENOL) 650 MG CR tablet Take 1,300 mg by mouth every 8 (eight) hours as needed for pain.     anastrozole (ARIMIDEX) 1 MG tablet TAKE 1 TABLET(1 MG) BY MOUTH DAILY 90 tablet 3   aspirin 81 MG tablet Take 81 mg by mouth 2 (two) times a week.     clopidogrel (PLAVIX) 75 MG tablet TAKE 1 TABLET(75 MG) BY MOUTH DAILY 90 tablet 1   furosemide (LASIX) 20 MG tablet Take 20 mg by mouth daily as needed for fluid.     lisinopril (ZESTRIL) 10 MG tablet TAKE 1 TABLET(10 MG) BY MOUTH DAILY 90 tablet 1   loratadine (CLARITIN) 10 MG tablet Take 10 mg by mouth daily as needed for allergies.     Menthol, Topical Analgesic, (BIOFREEZE EX) Apply 1 application topically daily as needed (muscle pain).  Multiple Vitamin (MULTIVITAMIN) capsule Take 1 capsule by mouth daily.     palbociclib (IBRANCE) 100 MG capsule Take 1 capsule (100 mg total) by mouth daily with breakfast. Take whole with food. Take for 14 days on, 7 days off, repeat every 21 days. 14 capsule 1   No current facility-administered medications for this visit.    PHYSICAL EXAMINATION: ECOG PERFORMANCE STATUS: 1 - Symptomatic but completely ambulatory  There were no vitals filed for this visit.  Wt Readings from Last 3 Encounters:  05/11/22 142 lb 6.4 oz (64.6 kg)  03/19/22 139 lb 6.4 oz (63.2 kg)  01/01/22 133 lb (60.3 kg)    GENERAL:alert, no distress and comfortable SKIN: skin color normal, no rashes or significant lesions EYES: normal, Conjunctiva are pink and non-injected, sclera clear  NEURO: alert & oriented x 3 with fluent speech  LABORATORY DATA:  I have reviewed the data as listed    Latest Ref Rng & Units 05/11/2022    2:43 PM 03/26/2022    9:31 AM 01/22/2022    1:19 PM  CBC  WBC 4.0 - 10.5 K/uL  1.9  2.5  3.4   Hemoglobin 12.0 - 15.0 g/dL 9.8  10.7  10.0   Hematocrit 36.0 - 46.0 % 28.3  32.0  29.2   Platelets 150 - 400 K/uL 88  108  184         Latest Ref Rng & Units 05/11/2022    2:43 PM 03/26/2022    9:31 AM 01/22/2022    1:19 PM  CMP  Glucose 70 - 99 mg/dL 91  91  148   BUN 8 - 23 mg/dL 39  41  40   Creatinine 0.44 - 1.00 mg/dL 1.51  1.51  1.56   Sodium 135 - 145 mmol/L 144  143  141   Potassium 3.5 - 5.1 mmol/L 4.5  4.4  4.2   Chloride 98 - 111 mmol/L 111  111  108   CO2 22 - 32 mmol/L 25  25  24    Calcium 8.9 - 10.3 mg/dL 9.9  9.6  9.6   Total Protein 6.5 - 8.1 g/dL 7.3  7.4  7.4   Total Bilirubin 0.3 - 1.2 mg/dL 0.5  0.3  0.4   Alkaline Phos 38 - 126 U/L 122  111  114   AST 15 - 41 U/L 20  20  19    ALT 0 - 44 U/L 11  13  15        RADIOGRAPHIC STUDIES: I have personally reviewed the radiological images as listed and agreed with the findings in the report. No results found.    No orders of the defined types were placed in this encounter.  All questions were answered. The patient knows to call the clinic with any problems, questions or concerns. No barriers to learning was detected. The total time spent in the appointment was 30 minutes.     Truitt Merle, MD 06/03/2022

## 2022-06-04 ENCOUNTER — Other Ambulatory Visit: Payer: Self-pay

## 2022-06-04 ENCOUNTER — Encounter: Payer: Self-pay | Admitting: Hematology

## 2022-06-04 ENCOUNTER — Inpatient Hospital Stay: Payer: Medicare FFS | Attending: Hematology

## 2022-06-04 ENCOUNTER — Inpatient Hospital Stay (HOSPITAL_BASED_OUTPATIENT_CLINIC_OR_DEPARTMENT_OTHER): Payer: Medicare FFS | Admitting: Hematology

## 2022-06-04 VITALS — BP 168/67 | HR 62 | Temp 97.5°F | Resp 18 | Ht 62.0 in | Wt 143.2 lb

## 2022-06-04 DIAGNOSIS — C7951 Secondary malignant neoplasm of bone: Secondary | ICD-10-CM | POA: Insufficient documentation

## 2022-06-04 DIAGNOSIS — Z170 Estrogen receptor positive status [ER+]: Secondary | ICD-10-CM

## 2022-06-04 DIAGNOSIS — E785 Hyperlipidemia, unspecified: Secondary | ICD-10-CM | POA: Insufficient documentation

## 2022-06-04 DIAGNOSIS — N183 Chronic kidney disease, stage 3 unspecified: Secondary | ICD-10-CM | POA: Insufficient documentation

## 2022-06-04 DIAGNOSIS — Z9012 Acquired absence of left breast and nipple: Secondary | ICD-10-CM | POA: Diagnosis not present

## 2022-06-04 DIAGNOSIS — Z7902 Long term (current) use of antithrombotics/antiplatelets: Secondary | ICD-10-CM | POA: Diagnosis not present

## 2022-06-04 DIAGNOSIS — C50212 Malignant neoplasm of upper-inner quadrant of left female breast: Secondary | ICD-10-CM

## 2022-06-04 DIAGNOSIS — D61818 Other pancytopenia: Secondary | ICD-10-CM | POA: Insufficient documentation

## 2022-06-04 DIAGNOSIS — Z79899 Other long term (current) drug therapy: Secondary | ICD-10-CM | POA: Diagnosis not present

## 2022-06-04 DIAGNOSIS — I129 Hypertensive chronic kidney disease with stage 1 through stage 4 chronic kidney disease, or unspecified chronic kidney disease: Secondary | ICD-10-CM | POA: Insufficient documentation

## 2022-06-04 DIAGNOSIS — Z8673 Personal history of transient ischemic attack (TIA), and cerebral infarction without residual deficits: Secondary | ICD-10-CM | POA: Insufficient documentation

## 2022-06-04 LAB — COMPREHENSIVE METABOLIC PANEL
ALT: 14 U/L (ref 0–44)
AST: 22 U/L (ref 15–41)
Albumin: 3.8 g/dL (ref 3.5–5.0)
Alkaline Phosphatase: 148 U/L — ABNORMAL HIGH (ref 38–126)
Anion gap: 6 (ref 5–15)
BUN: 28 mg/dL — ABNORMAL HIGH (ref 8–23)
CO2: 25 mmol/L (ref 22–32)
Calcium: 9.1 mg/dL (ref 8.9–10.3)
Chloride: 111 mmol/L (ref 98–111)
Creatinine, Ser: 1.54 mg/dL — ABNORMAL HIGH (ref 0.44–1.00)
GFR, Estimated: 34 mL/min — ABNORMAL LOW (ref >60)
Glucose, Bld: 85 mg/dL (ref 70–99)
Potassium: 4.3 mmol/L (ref 3.5–5.1)
Sodium: 142 mmol/L (ref 135–145)
Total Bilirubin: 0.5 mg/dL (ref 0.3–1.2)
Total Protein: 7.3 g/dL (ref 6.5–8.1)

## 2022-06-04 LAB — CBC WITH DIFFERENTIAL/PLATELET
Abs Immature Granulocytes: 0 10*3/uL (ref 0.00–0.07)
Basophils Absolute: 0 10*3/uL (ref 0.0–0.1)
Basophils Relative: 2 %
Eosinophils Absolute: 0.1 10*3/uL (ref 0.0–0.5)
Eosinophils Relative: 4 %
HCT: 27.5 % — ABNORMAL LOW (ref 36.0–46.0)
Hemoglobin: 9.5 g/dL — ABNORMAL LOW (ref 12.0–15.0)
Immature Granulocytes: 0 %
Lymphocytes Relative: 41 %
Lymphs Abs: 0.7 10*3/uL (ref 0.7–4.0)
MCH: 40.1 pg — ABNORMAL HIGH (ref 26.0–34.0)
MCHC: 34.5 g/dL (ref 30.0–36.0)
MCV: 116 fL — ABNORMAL HIGH (ref 80.0–100.0)
Monocytes Absolute: 0.1 10*3/uL (ref 0.1–1.0)
Monocytes Relative: 8 %
Neutro Abs: 0.8 10*3/uL — ABNORMAL LOW (ref 1.7–7.7)
Neutrophils Relative %: 45 %
Platelets: 97 10*3/uL — ABNORMAL LOW (ref 150–400)
RBC: 2.37 MIL/uL — ABNORMAL LOW (ref 3.87–5.11)
RDW: 14.3 % (ref 11.5–15.5)
Smear Review: NORMAL
WBC: 1.7 10*3/uL — ABNORMAL LOW (ref 4.0–10.5)
nRBC: 0 % (ref 0.0–0.2)

## 2022-06-05 LAB — CANCER ANTIGEN 27.29: CA 27.29: 52.2 U/mL — ABNORMAL HIGH (ref 0.0–38.6)

## 2022-06-20 IMAGING — CT NM PET TUM IMG RESTAG (PS) SKULL BASE T - THIGH
1 of 7 series · 1 of 25 positions shown · non-contrast
Comparison: PET-CT 03/31/2021 and 11/28/2020.

CLINICAL DATA: Subsequent treatment strategy for breast cancer.

EXAM:
NUCLEAR MEDICINE PET SKULL BASE TO THIGH
TECHNIQUE: 6.9 mCi F-18 FDG was injected intravenously. Full-ring PET imaging
was performed from the skull base to thigh after the radiotracer. CT
data was obtained and used for attenuation correction and anatomic
localization.
Fasting blood glucose: 95 mg/dl

[Series 4: ct sk_thigh 5.0 bf37 · axial · 5.0mm · 0.98mm/px · 1 of 215 slices shown]
[im 215/215  brain]
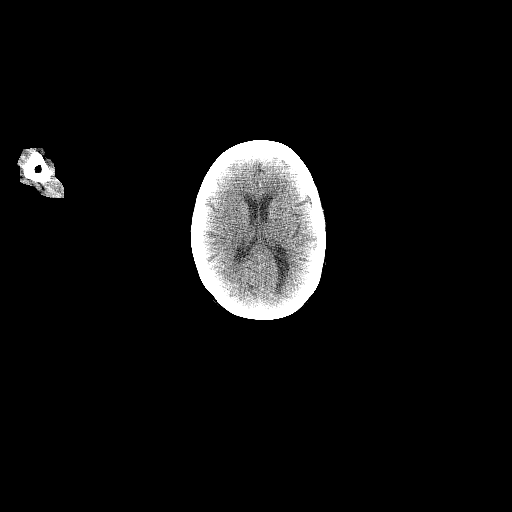

[1 of 25 positions shown; findings below may reference images not displayed]

FINDINGS: Mediastinal blood pool activity: SUV max

NECK:

No hypermetabolic cervical lymph nodes are identified.There are no
lesions of the pharyngeal mucosal space. Mildly asymmetric activity
associated with the vocal cords, without CT correlate, likely
physiologic.

Incidental CT findings: none

CHEST:

There are no hypermetabolic mediastinal, hilar or axillary lymph
nodes. There is no hypermetabolic pulmonary activity or suspicious
pulmonary nodularity.

Incidental CT findings: Stable mild pulmonary parenchymal scarring
bilaterally. Mild atherosclerosis of the aorta, great vessels and
coronary arteries. Stable postsurgical changes in the left breast.

ABDOMEN/PELVIS:

There is no hypermetabolic activity within the liver, adrenal
glands, spleen or pancreas. There is no hypermetabolic nodal
activity.

Incidental CT findings: Distal colonic diverticulosis and diffuse
aortic and branch vessel atherosclerosis again noted.

SKELETON:

There was diffuse, hypermetabolic osseous metastatic disease on the
original PET-CT from 11/28/2020. That was markedly improved on the
recent study, and no generalized recurrent hypermetabolic osseous
activity is seen. Mildly increased focal activity is present
superiorly in the L2 vertebral body (SUV max 3.4) and in the sternal
manubrium (SUV max 3.5). The widespread osseous metastases are
grossly unchanged in appearance on the CT images.

Incidental CT findings: none
IMPRESSION: 1. Widespread osseous metastatic disease again noted with areas of
minimally increased metabolic activity within the sternal manubrium
and L2 vertebral body.
2. No evidence of extra osseous metastatic disease.

## 2022-07-10 ENCOUNTER — Other Ambulatory Visit: Payer: Self-pay

## 2022-07-10 MED ORDER — PALBOCICLIB 100 MG PO CAPS: 100.0000 mg | ORAL_CAPSULE | Freq: Every day | ORAL | 2 refills | Status: DC

## 2022-07-26 ENCOUNTER — Other Ambulatory Visit: Payer: Self-pay

## 2022-07-26 MED ORDER — PALBOCICLIB 100 MG PO CAPS: 100.0000 mg | ORAL_CAPSULE | Freq: Every day | ORAL | 2 refills | Status: DC

## 2022-08-06 ENCOUNTER — Other Ambulatory Visit: Payer: Self-pay

## 2022-08-06 ENCOUNTER — Inpatient Hospital Stay: Payer: Medicare FFS | Attending: Hematology

## 2022-08-06 ENCOUNTER — Encounter: Payer: Self-pay | Admitting: Hematology

## 2022-08-06 ENCOUNTER — Inpatient Hospital Stay (HOSPITAL_BASED_OUTPATIENT_CLINIC_OR_DEPARTMENT_OTHER): Payer: Medicare FFS | Admitting: Hematology

## 2022-08-06 VITALS — BP 174/65 | HR 67 | Temp 97.8°F | Resp 18 | Ht 62.0 in | Wt 144.1 lb

## 2022-08-06 DIAGNOSIS — M47819 Spondylosis without myelopathy or radiculopathy, site unspecified: Secondary | ICD-10-CM | POA: Diagnosis not present

## 2022-08-06 DIAGNOSIS — C50212 Malignant neoplasm of upper-inner quadrant of left female breast: Secondary | ICD-10-CM | POA: Diagnosis present

## 2022-08-06 DIAGNOSIS — Z170 Estrogen receptor positive status [ER+]: Secondary | ICD-10-CM

## 2022-08-06 DIAGNOSIS — C7951 Secondary malignant neoplasm of bone: Secondary | ICD-10-CM | POA: Insufficient documentation

## 2022-08-06 DIAGNOSIS — N189 Chronic kidney disease, unspecified: Secondary | ICD-10-CM | POA: Insufficient documentation

## 2022-08-06 DIAGNOSIS — Z9012 Acquired absence of left breast and nipple: Secondary | ICD-10-CM | POA: Insufficient documentation

## 2022-08-06 DIAGNOSIS — I129 Hypertensive chronic kidney disease with stage 1 through stage 4 chronic kidney disease, or unspecified chronic kidney disease: Secondary | ICD-10-CM | POA: Insufficient documentation

## 2022-08-06 DIAGNOSIS — Z79811 Long term (current) use of aromatase inhibitors: Secondary | ICD-10-CM | POA: Diagnosis not present

## 2022-08-06 LAB — CBC WITH DIFFERENTIAL/PLATELET
Abs Immature Granulocytes: 0.01 10*3/uL (ref 0.00–0.07)
Basophils Absolute: 0 10*3/uL (ref 0.0–0.1)
Basophils Relative: 2 %
Eosinophils Absolute: 0.1 10*3/uL (ref 0.0–0.5)
Eosinophils Relative: 6 %
HCT: 29.1 % — ABNORMAL LOW (ref 36.0–46.0)
Hemoglobin: 10.2 g/dL — ABNORMAL LOW (ref 12.0–15.0)
Immature Granulocytes: 1 %
Lymphocytes Relative: 45 %
Lymphs Abs: 0.8 10*3/uL (ref 0.7–4.0)
MCH: 41.6 pg — ABNORMAL HIGH (ref 26.0–34.0)
MCHC: 35.1 g/dL (ref 30.0–36.0)
MCV: 118.8 fL — ABNORMAL HIGH (ref 80.0–100.0)
Monocytes Absolute: 0.1 10*3/uL (ref 0.1–1.0)
Monocytes Relative: 7 %
Neutro Abs: 0.7 10*3/uL — ABNORMAL LOW (ref 1.7–7.7)
Neutrophils Relative %: 39 %
Platelets: 138 10*3/uL — ABNORMAL LOW (ref 150–400)
RBC: 2.45 MIL/uL — ABNORMAL LOW (ref 3.87–5.11)
RDW: 14.1 % (ref 11.5–15.5)
WBC: 1.8 10*3/uL — ABNORMAL LOW (ref 4.0–10.5)
nRBC: 0 % (ref 0.0–0.2)

## 2022-08-06 LAB — COMPREHENSIVE METABOLIC PANEL
ALT: 13 U/L (ref 0–44)
AST: 20 U/L (ref 15–41)
Albumin: 3.9 g/dL (ref 3.5–5.0)
Alkaline Phosphatase: 128 U/L — ABNORMAL HIGH (ref 38–126)
Anion gap: 6 (ref 5–15)
BUN: 40 mg/dL — ABNORMAL HIGH (ref 8–23)
CO2: 27 mmol/L (ref 22–32)
Calcium: 9.8 mg/dL (ref 8.9–10.3)
Chloride: 108 mmol/L (ref 98–111)
Creatinine, Ser: 1.9 mg/dL — ABNORMAL HIGH (ref 0.44–1.00)
GFR, Estimated: 26 mL/min — ABNORMAL LOW (ref >60)
Glucose, Bld: 113 mg/dL — ABNORMAL HIGH (ref 70–99)
Potassium: 5 mmol/L (ref 3.5–5.1)
Sodium: 141 mmol/L (ref 135–145)
Total Bilirubin: 0.4 mg/dL (ref 0.3–1.2)
Total Protein: 7.6 g/dL (ref 6.5–8.1)

## 2022-08-06 NOTE — Progress Notes (Signed)
[place]   [phone] [phone]   Clinic Follow up Note   Patient Care Team: Binnie Rail, MD as PCP - General (Internal Medicine) Garvin Fila, MD as Consulting Physician (Neurology) Rolm Bookbinder, MD as Consulting Physician (General Surgery) Truitt Merle, MD as Consulting Physician (Hematology) Kyung Rudd, MD as Consulting Physician (Radiation Oncology)  Date of Service:  08/06/2022  CHIEF COMPLAINT: f/u of metastatic breast cancer  CURRENT THERAPY:  -Zometa, starting 11/29/20, subsequently held due to CKD -Anastrozole, starting 11/2020 Shirley Wells, starting 01/09/21, currently 75 mg days 1-14 q21d, increased to 110m day 1-14 every 21 days in May 2023   ASSESSMENT & PLAN:  [name] a 81y.o. female with   1. 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 S/p left total mastectomy. She declined antiestrogen therapy due to concerns of side effects. She lost follow up after 07/2017.   -After 6 months of left hip and ribcage pain she was found to have diffuse bone metastasis on 11/28/20 PET. This was confirmed on 12/09/20 bone biopsy which showed metastatic carcinoma compatible from prior breast cancer, ER 50%+, PR 15% positive and HER2 negative. -she started first-line Anastrozole in 11/2020 and Ibrance on 01/09/21.  -restaging PET from 03/26/22 showed: interval increase in metabolic activity within L2 vertebral body; new activity associated with nondisplaced pathologic fracture through left T1 transverse process; resolution of manubrial hypermetabolic activity; no evidence of extraosseous metastatic disease.  -we increased her Ibrance to 100 mg on 04/16/22. She is tolerating well. -she reports she is feeling better with more energy. Lab reviewed, creatinine up to 1.9  today, CBC overall stable on treatment. -plan for restaging PET around 10/26/22, per pt request. She knows to call me if she has new pain, especially in low back.   2. Diffuse bone metastasis, L1/L3 fracture, hypercalcemia -S/p palliative RT with Dr MLisbeth Renshaw1/12-1/26/22 for severe left hip pain. -She received one dose of Zometa on 11/29/20 for hypercalcinemia, but given her CKD this has been held. Her calcium has been WNL to low since Zometa injection. -PET on 10/13/21 showed overall stable osseous metastatic disease compared to 03/2021. -she does not have much, if any, pain.     PLAN:  -Continue Anastrozole daily and Ibrance 1030m 2 weeks on/1 week off -lab and PET scan around 10/26/22 -virtual visit a few days later to review scan results   No problem-specific Assessment & Plan notes found for this encounter.   SUMMARY OF ONCOLOGIC HISTORY: Oncology History Overview Note   Cancer Staging  Malignant neoplasm of upper-inner quadrant of left breast in female, estrogen receptor positive ([facility]aging form: Breast, AJCC 8th Edition - Clinical stage from 06/18/2017: Stage IB (cT2, cN0, cM0, G2, ER+, PR+, HER2-) - Signed by FeTruitt MerleMD on 06/24/2017 Histologic grading system: 3 grade system - Pathologic stage from 07/30/2017: Stage IA (pT2, pN0, cM0, G2, ER+, PR+, HER2-, Oncotype DX score: 23) - Signed by FeTruitt MerleMD on 08/23/2017 Neoadjuvant therapy: No Nuclear grade: G2 Multigene prognostic tests performed: Oncotype DX Recurrence score range: Greater than or equal to 11 Histologic grading system: 3 grade system Residual tumor (R): R0 - None Laterality: Left     Malignant neoplasm of upper-inner quadrant of left breast in female, estrogen receptor positive ([facility] 06/17/2017 Mammogram   [place] MM diagnostic  Breast Tomo Bilateral  IMPRESSION: 1. 3.4 palpable mass in the 10 o'clock position of the left breast is highly suspicious for malignancy.  2. No evidence of malignancy in the right  breast.  3. Ultrasound of the left axilla shows normal sized axillary lymph nodes.   06/18/2017 Initial Biopsy   Diagnosis 06/18/17 Breast, left, needle core biopsy, 10:00 o'clock - INVASIVE MAMMARY CARCINOMA. At least G2  The malignant cells are negative for E-Cadherin, supporting a lobular phenotype.   06/18/2017 Initial Diagnosis   Malignant neoplasm of upper-inner quadrant of left breast in female, estrogen receptor positive ([place])   06/18/2017 Receptors her2   ER 95%+, PR 25%+, both strong staining  HER2- Ki67 30%    07/30/2017 Surgery   LEFT TOTAL MASTECTOMY WITH AXILLARY SENTINEL LYMPH NODE BIOPSY by Dr. Donne Hazel on 07/30/17   07/30/2017 Pathology Results   Diagnosis 07/30/17 1. Breast, simple mastectomy, Left Total - INVASIVE LOBULAR CARCINOMA, GRADE 2, SPANNING 3.5 CM. - LOBULAR CARCINOMA IN SITU. - INVASIVE CARCINOMA IS BROADLY 0.1 CM FROM POSTERIOR MARGIN. - PERINEURAL INVASION PRESENT. - SEE ONCOLOGY TABLE. 2. Lymph node, sentinel, biopsy, Left Axillary - ONE OF ONE LYMPH NODES NEGATIVE FOR CARCINOMA (0/1). 3. Lymph node, sentinel, biopsy, Left - ONE OF ONE LYMPH NODES NEGATIVE FOR CARCINOMA (0/1).   07/30/2017 Oncotype testing   Oncotype 07/30/17 Recurrence score is 23, an intermediate risk With a 10-year risk of recurrence at 15% with tamoxifen alone.     11/11/2020 Imaging   MRI Lumbar Spine  IMPRESSION: Diffuse osseous metastases. Chronic inferior L2 endplate deformity with mild height loss.   Likely late subacute pathologic fracture deformities involving the superior L1 endplate and right L3 lamina.   Multilevel spondylosis. Moderate to severe spinal canal and neural foraminal narrowing at the L3-4 and L4-5 levels.   Mild to moderate spinal canal and neural foraminal narrowing at the L5-S1 level.   11/28/2020 PET scan   IMPRESSION: 1. Widespread and diffuse hypermetabolic bony metastases. 2. No evidence for soft tissue metastases in the neck,  chest, abdomen, or pelvis. 3.  Aortic Atherosclerois (ICD10-170.0)     11/29/2020 -  Chemotherapy   Zometa q70month starting 11/29/20. Held after first dose. Given her CKD will only give when she has hypercalcemia and may switch to Prolia.     12/04/2020 Imaging   MRI Brain  IMPRESSION: No evidence of intracranial metastatic disease.   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   Mild chronic microvascular ischemic changes. Small chronic right cerebellar infarct.     12/07/2020 - 12/21/2020 Radiation Therapy   palliative RT to left hip with Dr MLisbeth Renshawon 12/07/20-12/21/20   12/09/2020 Pathology Results   FINAL MICROSCOPIC DIAGNOSIS:   A. BONE, BIOPSY:  - Metastatic carcinoma.  - See comment.   COMMENT:  The morphology is compatible with metastatic lobular breast carcinoma.  Estrogen receptor, progesterone receptor and HER2/neu will be performed.   ADDENDUM:   PROGNOSTIC INDICATOR RESULTS:   Immunohistochemical and morphometric analysis performed manually   The tumor cells are EQUIVOCAL for Her2 (2+).  Her2 by FISH will be performed and the results will be reported  separately.   Estrogen Receptor:       POSITIVE, 50%, MODERATE-STRONG STAINING  Progesterone Receptor:   POSITIVE, 15%, MODERATE-STRONG STAINING    11/2020 -  Anti-estrogen oral therapy   First-line Anti-estrogen therapy Anastrozole 122mdaily starting in 11/2020 and Ibrance starting in 12/2020    01/09/2021 -  Chemotherapy   Ibrance 3  weeks on/1 weeks off starting beginning of 01/09/21. Reduced to 75m starting with C2 (02/13/21) due to cytopenia and fatigue. Reduced to 734m2 weeks on/1 week off starting C3 on 03/13/21)    03/31/2021 PET scan   IMPRESSION: 1. Dramatic reduction in metabolic activity throughout the skeleton. Metabolic activity of diffuse sclerotic skeletal lesions is now essentially at background blood pool level. No focal metabolic activity. 2. Persistent diffuse confluent sclerotic  skeletal metastasis unchanged. 3. No evidence of soft tissue breast cancer metastasis.   10/13/2021 PET scan   IMPRESSION: 1. Widespread osseous metastatic disease again noted with areas of minimally increased metabolic activity within the sternal manubrium and L2 vertebral body. 2. No evidence of extra osseous metastatic disease.      INTERVAL HISTORY:  Shirley BLANCHARDs here for a follow up of metastatic breast cancer. She was last seen by me on 06/04/22. She presents to the clinic accompanied by her oldest daughter Shirley GriffinsShe reports she is doing very well, denies pain. She reports her main concern is some new, intermittent issues with her eyes and wonders if this is related to the medicine I give her.   All other systems were reviewed with the patient and are negative.  MEDICAL HISTORY:  Past Medical History:  Diagnosis Date   CHICKENPOX, HX OF 03/21/2010   Qualifier: Diagnosis of  By: BrMarca AnconaMA, Lucy     CVA (cerebral infarction) 02/2010   no residual deficits, a/w mid basal art stenosis, declined IC stent as rec by IR/neuro   Dyslipidemia    intol of statin due to >3x increase LFTs   GERD (gastroesophageal reflux disease)    hx   Hypertension, essential    Malignant neoplasm of upper-inner quadrant of left breast in female, estrogen receptor positive ([place]7/26/[place]   Osteoarthritis    [facility]    SURGICAL HISTORY: Past Surgical History:  Procedure Laterality Date   CHOLECYSTECTOMY  1981   MASTECTOMY W/ SENTINEL NODE BIOPSY Left 07/30/2017   Procedure: LEFT TOTAL MASTECTOMY WITH AXILLARY SENTINEL LYMPH NODE BIOPSY;  Surgeon: WaRolm BookbinderMD;  [facility] [facility]: [facility];  Laterality: Left;    I have reviewed the social history and family history with the patient and they are unchanged from previous note.  ALLERGIES:  is allergic to influenza vaccines, pneumococcal vaccines, simvastatin, tetanus toxoids, and zoster vaccine live.  MEDICATIONS:   Current Outpatient Medications  Medication Sig Dispense Refill   acetaminophen (TYLENOL) 650 MG CR tablet Take 1,300 mg by mouth every 8 (eight) hours as needed for pain.     anastrozole (ARIMIDEX) 1 MG tablet TAKE 1 TABLET(1 MG) BY MOUTH DAILY 90 tablet 3   aspirin 81 MG tablet Take 81 mg by mouth 2 (two) times a week.     clopidogrel (PLAVIX) 75 MG tablet TAKE 1 TABLET(75 MG) BY MOUTH DAILY 90 tablet 1   furosemide (LASIX) 20 MG tablet Take 20 mg by mouth daily as needed for fluid.     lisinopril (ZESTRIL) 10 MG tablet TAKE 1 TABLET(10 MG) BY MOUTH DAILY 90 tablet 1   loratadine (CLARITIN) 10 MG tablet Take 10 mg by mouth daily as needed for allergies.     Menthol, Topical Analgesic, (BIOFREEZE EX) Apply 1 application topically daily as needed (muscle pain).     Multiple Vitamin (MULTIVITAMIN) capsule Take 1 capsule by mouth daily.     palbociclib (IBRANCE) 100 MG capsule Take 1 capsule (100 mg total) by mouth daily  with breakfast. Take whole with food. Take for 14 days on, 7 days off, repeat every 21 days. 14 capsule 2   No current facility-administered medications for this visit.    PHYSICAL EXAMINATION: ECOG PERFORMANCE STATUS: 1 - Symptomatic but completely ambulatory  Vitals:   08/06/22 1129  BP: (!) 174/65  Pulse: 67  Resp: 18  Temp: 97.8 F (36.6 C)  SpO2: 99%   Wt Readings from Last 3 Encounters:  08/06/22 144 lb 1.6 oz (65.4 kg)  06/04/22 143 lb 3.2 oz (65 kg)  05/11/22 142 lb 6.4 oz (64.6 kg)     GENERAL:alert, no distress and comfortable SKIN: skin color, texture, turgor are normal, no rashes or significant lesions EYES: normal, Conjunctiva are pink and non-injected, sclera clear  NECK: supple, thyroid normal size, non-tender, without nodularity LYMPH:  no palpable lymphadenopathy in the cervical, axillary  LUNGS: clear to auscultation and percussion with normal breathing effort HEART: regular rate & rhythm and no murmurs and no lower extremity  edema ABDOMEN:abdomen soft, non-tender and normal bowel sounds Musculoskeletal:no cyanosis of digits and no clubbing  NEURO: alert & oriented x 3 with fluent speech, no focal motor/sensory deficits  LABORATORY DATA:  I have reviewed the data as listed    Latest Ref Rng & Units 08/06/2022   11:11 AM 06/04/2022   11:15 AM 05/11/2022    2:43 PM  CBC  WBC 4.0 - 10.5 K/uL 1.8  1.7  1.9   Hemoglobin 12.0 - 15.0 g/dL 10.2  9.5  9.8   Hematocrit 36.0 - 46.0 % 29.1  27.5  28.3   Platelets 150 - 400 K/uL 138  97  88         Latest Ref Rng & Units 08/06/2022   11:11 AM 06/04/2022   11:15 AM 05/11/2022    2:43 PM  CMP  Glucose 70 - 99 mg/dL 113  85  91   BUN 8 - 23 mg/dL 40  28  39   Creatinine 0.44 - 1.00 mg/dL 1.90  1.54  1.51   Sodium 135 - 145 mmol/L 141  142  144   Potassium 3.5 - 5.1 mmol/L 5.0  4.3  4.5   Chloride 98 - 111 mmol/L 108  111  111   CO2 22 - 32 mmol/L 27  25  25    Calcium 8.9 - 10.3 mg/dL 9.8  9.1  9.9   Total Protein 6.5 - 8.1 g/dL 7.6  7.3  7.3   Total Bilirubin 0.3 - 1.2 mg/dL 0.4  0.5  0.5   Alkaline Phos 38 - 126 U/L 128  148  122   AST 15 - 41 U/L 20  22  20    ALT 0 - 44 U/L 13  14  11        RADIOGRAPHIC STUDIES: I have personally reviewed the radiological images as listed and agreed with the findings in the report. No results found.    Orders Placed This Encounter  Procedures   NM PET Image Restag (PS) Skull Base To Thigh    Standing Status:   Future    Standing Expiration Date:   08/06/2023    Order Specific Question:   If indicated for the ordered procedure, I authorize the administration of a radiopharmaceutical per Radiology protocol    Answer:   Yes    Order Specific Question:   Preferred imaging location?    Answer:   [facility]   All questions were answered. The patient knows to  call the clinic with any problems, questions or concerns. No barriers to learning was detected. The total time spent in the appointment was 30 minutes.     Truitt Merle, MD 08/06/2022   I, Wilburn Mylar, am acting as scribe for Truitt Merle, MD.   I have reviewed the above documentation for accuracy and completeness, and I agree with the above.

## 2022-08-07 ENCOUNTER — Other Ambulatory Visit: Payer: Self-pay

## 2022-08-07 DIAGNOSIS — C50212 Malignant neoplasm of upper-inner quadrant of left female breast: Secondary | ICD-10-CM

## 2022-08-07 DIAGNOSIS — C7951 Secondary malignant neoplasm of bone: Secondary | ICD-10-CM

## 2022-08-07 LAB — CANCER ANTIGEN 27.29: CA 27.29: 95.9 U/mL — ABNORMAL HIGH (ref 0.0–38.6)

## 2022-08-07 MED ORDER — PALBOCICLIB 100 MG PO CAPS: 100.0000 mg | ORAL_CAPSULE | Freq: Every day | ORAL | 2 refills | Status: DC

## 2022-09-01 ENCOUNTER — Other Ambulatory Visit: Payer: Self-pay | Admitting: Internal Medicine

## 2022-09-14 ENCOUNTER — Other Ambulatory Visit: Payer: Self-pay

## 2022-09-14 NOTE — Progress Notes (Signed)
LVM for NM Scheduler [phone]) Ronny Bacon regarding getting pt scheduled for NM PET Restaging.  PET Scan has been authorized by [employer].  Asked Ronny Bacon to give this RN a call.

## 2022-09-20 ENCOUNTER — Encounter: Payer: Self-pay | Admitting: Internal Medicine

## 2022-09-20 NOTE — Progress Notes (Signed)
Subjective:    Patient ID: Shirley Wells, female    DOB: [birth year], 81 y.o.   MRN: [id number]     HPI Kamille is here for follow up of her chronic medical problems, including metastatic breast ca, CKD, htn, hld, predaibetes, leg edema, CVA, hld, osteopenia - high frax   Had a sore throat last week - took amoxicillin and claritin and got over it.    Feels good.    Medications and allergies reviewed with patient and updated if appropriate.  Current Outpatient Medications on File Prior to Visit  Medication Sig Dispense Refill   acetaminophen (TYLENOL) 650 MG CR tablet Take 1,300 mg by mouth every 8 (eight) hours as needed for pain.     anastrozole (ARIMIDEX) 1 MG tablet TAKE 1 TABLET(1 MG) BY MOUTH DAILY 90 tablet 3   aspirin 81 MG tablet Take 81 mg by mouth 2 (two) times a week.     clopidogrel (PLAVIX) 75 MG tablet TAKE 1 TABLET(75 MG) BY MOUTH DAILY 90 tablet 1   furosemide (LASIX) 20 MG tablet Take 20 mg by mouth daily as needed for fluid.     lisinopril (ZESTRIL) 10 MG tablet TAKE 1 TABLET(10 MG) BY MOUTH DAILY 90 tablet 1   loratadine (CLARITIN) 10 MG tablet Take 10 mg by mouth daily as needed for allergies.     Menthol, Topical Analgesic, (BIOFREEZE EX) Apply 1 application topically daily as needed (muscle pain).     Multiple Vitamin (MULTIVITAMIN) capsule Take 1 capsule by mouth daily.     palbociclib (IBRANCE) 100 MG capsule Take 1 capsule (100 mg total) by mouth daily with breakfast. Take whole with food. Take for 14 days on, 7 days off, repeat every 21 days. 14 capsule 2   No current facility-administered medications on file prior to visit.     Review of Systems  Constitutional:  Negative for appetite change, chills and fever.  Respiratory:  Negative for cough, shortness of breath and wheezing.   Cardiovascular:  Negative for chest pain, palpitations and leg swelling.  Musculoskeletal:  Positive for back pain.  Neurological:  Negative for light-headedness and  headaches.  Psychiatric/Behavioral:  Positive for sleep disturbance.        Objective:   Vitals:   09/21/22 1300  BP: (!) 142/72  Pulse: 72  Temp: 98 F (36.7 C)  SpO2: 96%   BP Readings from Last 3 Encounters:  09/21/22 (!) 142/72  08/06/22 (!) 174/65  06/04/22 (!) 168/67   Wt Readings from Last 3 Encounters:  09/21/22 146 lb 3.2 oz (66.3 kg)  08/06/22 144 lb 1.6 oz (65.4 kg)  06/04/22 143 lb 3.2 oz (65 kg)   Body mass index is 26.74 kg/m.    Physical Exam Constitutional:      General: She is not in acute distress.    Appearance: Normal appearance.  HENT:     Head: Normocephalic and atraumatic.  Eyes:     Conjunctiva/sclera: Conjunctivae normal.  Cardiovascular:     Rate and Rhythm: Normal rate and regular rhythm.     Heart sounds: Normal heart sounds. No murmur heard. Pulmonary:     Effort: Pulmonary effort is normal. No respiratory distress.     Breath sounds: Normal breath sounds. No wheezing.  Musculoskeletal:     Cervical back: Neck supple.     Right lower leg: No edema.     Left lower leg: No edema.  Lymphadenopathy:     Cervical: No cervical  adenopathy.  Skin:    General: Skin is warm and dry.     Findings: No rash.  Neurological:     Mental Status: She is alert. Mental status is at baseline.  Psychiatric:        Mood and Affect: Mood normal.        Behavior: Behavior normal.        Lab Results  Component Value Date   WBC 1.8 (L) 08/06/2022   HGB 10.2 (L) 08/06/2022   HCT 29.1 (L) 08/06/2022   PLT 138 (L) 08/06/2022   GLUCOSE 113 (H) 08/06/2022   CHOL 304 (H) 09/12/2020   TRIG (H) 09/12/2020    462.0 Triglyceride is over 400; calculations on Lipids are invalid.   HDL 47.70 09/12/2020   LDLDIRECT 58.0 09/12/2020   LDLCALC 232 (H) 04/30/2014   ALT 13 08/06/2022   AST 20 08/06/2022   NA 141 08/06/2022   K 5.0 08/06/2022   CL 108 08/06/2022   CREATININE 1.90 (H) 08/06/2022   BUN 40 (H) 08/06/2022   CO2 27 08/06/2022   TSH 1.67  11/02/2016   INR 1.1 12/09/2020   HGBA1C 5.1 03/19/2022   HGBA1C 5.1 03/19/2022   HGBA1C 5.1 (A) 03/19/2022   HGBA1C 5.1 03/19/2022     Assessment & Plan:    See Problem List for Assessment and Plan of chronic medical problems.

## 2022-09-20 NOTE — Patient Instructions (Addendum)
       Medications changes include : None      Return in about 6 months (around 03/23/2023) for follow up.

## 2022-09-21 ENCOUNTER — Ambulatory Visit: Payer: Medicare FFS | Admitting: Internal Medicine

## 2022-09-21 VITALS — BP 142/72 | HR 72 | Temp 98.0°F | Ht 62.0 in | Wt 146.2 lb

## 2022-09-21 DIAGNOSIS — N1832 Chronic kidney disease, stage 3b: Secondary | ICD-10-CM | POA: Diagnosis not present

## 2022-09-21 DIAGNOSIS — E785 Hyperlipidemia, unspecified: Secondary | ICD-10-CM

## 2022-09-21 DIAGNOSIS — I10 Essential (primary) hypertension: Secondary | ICD-10-CM | POA: Diagnosis not present

## 2022-09-21 DIAGNOSIS — R7303 Prediabetes: Secondary | ICD-10-CM | POA: Diagnosis not present

## 2022-09-21 DIAGNOSIS — R600 Localized edema: Secondary | ICD-10-CM

## 2022-09-21 DIAGNOSIS — M85851 Other specified disorders of bone density and structure, right thigh: Secondary | ICD-10-CM

## 2022-09-21 NOTE — Assessment & Plan Note (Signed)
Chronic Lab Results  Component Value Date   HGBA1C 5.1 03/19/2022   HGBA1C 5.1 03/19/2022   HGBA1C 5.1 (A) 03/19/2022   HGBA1C 5.1 03/19/2022   Continue low sugar diet Encourage regular exercise

## 2022-09-21 NOTE — Assessment & Plan Note (Signed)
Chronic Had recent blood work Kidney function slightly worse

## 2022-09-21 NOTE — Assessment & Plan Note (Addendum)
Chronic On Zometa Due for DEXA-deferred

## 2022-09-21 NOTE — Assessment & Plan Note (Signed)
Chronic Takes Lasix 20 mg daily as needed for edema, which she rarely needs Okay to continue

## 2022-09-21 NOTE — Assessment & Plan Note (Signed)
Chronic Blood pressure well-controlled Continue lisinopril 10 mg daily 

## 2022-09-21 NOTE — Assessment & Plan Note (Signed)
Chronic Deferred statin Encouraged healthy diet

## 2022-09-25 ENCOUNTER — Telehealth: Payer: Self-pay | Admitting: Pharmacy Technician

## 2022-09-25 NOTE — Telephone Encounter (Signed)
Oral Oncology Patient Advocate Encounter   Received notification that patient is due for re-enrollment for assistance for Ibrance through [employer].   Re-enrollment process has been initiated and will be submitted upon completion of necessary documents.  Patient agreed to sign documents on 10/01/22.  [employer] On[employer] Together phone number [phone].   I will continue to follow until final determination.  Lady Deutscher, CPhT-Adv Oncology Pharmacy Patient [place] Direct Number: [phone]  Fax: [phone]

## 2022-10-01 ENCOUNTER — Inpatient Hospital Stay: Payer: Medicare FFS | Attending: Hematology

## 2022-10-01 ENCOUNTER — Ambulatory Visit (HOSPITAL_COMMUNITY)
Admission: RE | Admit: 2022-10-01 | Discharge: 2022-10-01 | Disposition: A | Discharge status: Home / Self Care | Payer: Medicare FFS | Source: Ambulatory Visit | Attending: Hematology | Admitting: Hematology

## 2022-10-01 ENCOUNTER — Other Ambulatory Visit: Payer: Self-pay

## 2022-10-01 DIAGNOSIS — Z5111 Encounter for antineoplastic chemotherapy: Secondary | ICD-10-CM | POA: Diagnosis present

## 2022-10-01 DIAGNOSIS — C50212 Malignant neoplasm of upper-inner quadrant of left female breast: Secondary | ICD-10-CM | POA: Insufficient documentation

## 2022-10-01 DIAGNOSIS — Z79811 Long term (current) use of aromatase inhibitors: Secondary | ICD-10-CM | POA: Insufficient documentation

## 2022-10-01 DIAGNOSIS — Z170 Estrogen receptor positive status [ER+]: Secondary | ICD-10-CM | POA: Insufficient documentation

## 2022-10-01 DIAGNOSIS — C7951 Secondary malignant neoplasm of bone: Secondary | ICD-10-CM | POA: Diagnosis not present

## 2022-10-01 DIAGNOSIS — I129 Hypertensive chronic kidney disease with stage 1 through stage 4 chronic kidney disease, or unspecified chronic kidney disease: Secondary | ICD-10-CM | POA: Insufficient documentation

## 2022-10-01 DIAGNOSIS — N189 Chronic kidney disease, unspecified: Secondary | ICD-10-CM | POA: Insufficient documentation

## 2022-10-01 LAB — GLUCOSE, CAPILLARY: Glucose-Capillary: 112 mg/dL — ABNORMAL HIGH (ref 70–99)

## 2022-10-01 MED ORDER — FLUDEOXYGLUCOSE F - 18 (FDG) INJECTION
7.3000 | Freq: Once | INTRAVENOUS | Status: AC
Start: 2022-10-01 — End: 2022-10-01
  Administered 2022-10-01: 8.01 via INTRAVENOUS

## 2022-10-01 NOTE — Telephone Encounter (Signed)
Oral Oncology Patient Advocate Encounter   Submitted application for assistance for Ibrance to [employer] [facility]   Application submitted via online portal   [employer] [facility] phone number [phone].   I will continue to check the status until final determination.   Rooney Gladwin, CPhT-Adv Oncology Pharmacy Patient Advocate [facility] Direct Number: [phone]  Fax: [phone]   

## 2022-10-02 LAB — CANCER ANTIGEN 27.29: CA 27.29: 97.5 U/mL — ABNORMAL HIGH (ref 0.0–38.6)

## 2022-10-03 ENCOUNTER — Telehealth: Payer: Medicare FFS | Admitting: Hematology

## 2022-10-04 ENCOUNTER — Telehealth: Payer: Self-pay | Admitting: Pharmacist

## 2022-10-04 ENCOUNTER — Other Ambulatory Visit (HOSPITAL_COMMUNITY): Payer: Self-pay

## 2022-10-04 ENCOUNTER — Inpatient Hospital Stay (HOSPITAL_BASED_OUTPATIENT_CLINIC_OR_DEPARTMENT_OTHER): Payer: Medicare FFS | Admitting: Hematology

## 2022-10-04 ENCOUNTER — Encounter: Payer: Self-pay | Admitting: Hematology

## 2022-10-04 DIAGNOSIS — I129 Hypertensive chronic kidney disease with stage 1 through stage 4 chronic kidney disease, or unspecified chronic kidney disease: Secondary | ICD-10-CM

## 2022-10-04 DIAGNOSIS — Z170 Estrogen receptor positive status [ER+]: Secondary | ICD-10-CM

## 2022-10-04 DIAGNOSIS — Z79811 Long term (current) use of aromatase inhibitors: Secondary | ICD-10-CM

## 2022-10-04 DIAGNOSIS — C7951 Secondary malignant neoplasm of bone: Secondary | ICD-10-CM

## 2022-10-04 DIAGNOSIS — C50212 Malignant neoplasm of upper-inner quadrant of left female breast: Secondary | ICD-10-CM

## 2022-10-04 DIAGNOSIS — N189 Chronic kidney disease, unspecified: Secondary | ICD-10-CM

## 2022-10-04 MED ORDER — ABEMACICLIB 100 MG PO TABS
100.0000 mg | ORAL_TABLET | Freq: Two times a day (BID) | ORAL | 0 refills | Status: DC
  Filled 2022-10-04: qty 56, 28d supply, fill #0

## 2022-10-04 NOTE — Telephone Encounter (Signed)
Oral Oncology Pharmacist Encounter  Prior Authorization for Melynda Keller has been approved.    PA# [id number] Effective dates: 10/04/22 through 04/04/23  Patients co-pay is $3,251.42  Oral Oncology Clinic will proceed with manufacturer assistance process.   Leron Croak, PharmD, BCPS, [facility] Hematology/Oncology Clinical Pharmacist [facility] and [place] [phone] 10/04/2022 12:38 PM

## 2022-10-04 NOTE — Telephone Encounter (Signed)
Oral Oncology Pharmacist Encounter   Received notification from [facility] that prior authorization for Verzenio is required.   PA submitted on CoverMyMeds Key: [id number] Status is pending   Oral Oncology Clinic will continue to follow.   Leron Croak, PharmD, BCPS, BCOP Hematology/Oncology Clinical Pharmacist [facility] [place] [phone] 10/04/2022 12:01 PM

## 2022-10-04 NOTE — Telephone Encounter (Signed)
Oral Oncology Patient Advocate Encounter  [name]  has been discontinued by the provider and therapy will be changed.   I have notified the program to cancel enrollment.  Lady Deutscher, CPhT-Adv Oncology Pharmacy Patient [place] Direct Number: [phone]  Fax: [phone]

## 2022-10-04 NOTE — Progress Notes (Signed)
[place]   Telephone:([phone] Fax:([phone]) [phone]   Clinic Follow up Note   Patient Care Team: Binnie Rail, MD as PCP - General (Internal Medicine) Garvin Fila, MD as Consulting Physician (Neurology) Rolm Bookbinder, MD as Consulting Physician (General Surgery) Truitt Merle, MD as Consulting Physician (Hematology) Kyung Rudd, MD as Consulting Physician (Radiation Oncology)  Date of Service:  10/04/2022  I connected with [name] on 10/04/2022 at 11:00 AM EST by telephone visit and verified that I am speaking with the correct person using two identifiers.  I discussed the limitations, risks, security and privacy concerns of performing an evaluation and management service by telephone and the availability of in person appointments. I also discussed with the patient that there may be a patient responsible charge related to this service. The patient expressed understanding and agreed to proceed.   Other persons participating in the visit and their role in the encounter:  none  Patient's location:  home Provider's location:  my office  CHIEF COMPLAINT: f/u of metastatic breast cancer  CURRENT THERAPY:  To start fulvestrant injections and oral Verzenio  ASSESSMENT & PLAN:  Shirley Wells is a 81 y.o. female with   1. 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 She declined antiestrogen therapy and was lost to follow up after 07/2017.   -bone metastases found on PET on 11/28/20 for left hip and ribcage pain and confirmed with biopsy 12/09/20. -she started first-line Anastrozole in 11/2020 and Ibrance on 01/09/21. Ibrance dose increased to 149m from 04/16/22. -her CA 27.29 initially stabilized with dose increase but jumped to over 90 in 07/2022. -restaging PET from 10/01/22 showed: slight progression in osseous metastatic disease.  --I reviewed  the results with her today. I discussed changing her treatment to fulvestrant injection and Verzenio, which will replace both the anastrozole and Ibrance. I explained potential AE's, which are very similar to current regimen. She agrees to proceed; plan to start next week.   2. Diffuse bone metastasis, L1/L3 fracture, hypercalcemia -S/p palliative RT with Dr MLisbeth Renshaw1/12-1/26/22 for severe left hip pain. -She received one dose of Zometa on 11/29/20 for hypercalcinemia, but given her CKD this has been held. Her calcium has been WNL since Zometa injection. -she reports chronic low back pain, does not require prescription medication at this point.     PLAN:  -fulvestrant injection in 1 and 3 weeks -she will stop Anastrozole and Ibrand in a week -[place] will start on 11/27 -lab and f/u in 3 weeks   No problem-specific Assessment & Plan notes found for this encounter.   SUMMARY OF ONCOLOGIC HISTORY: Oncology History Overview Note   Cancer Staging  Malignant neoplasm of upper-inner quadrant of left breast in female, estrogen receptor positive ([place] Staging form: Breast, AJCC 8th Edition - Clinical stage from 06/18/2017: Stage IB (cT2, cN0, cM0, G2, ER+, PR+, HER2-) - Signed by FTruitt Merle MD on 06/24/2017 Histologic grading system: 3 grade system - Pathologic stage from 07/30/2017: Stage IA (pT2, pN0, cM0, G2, ER+, PR+, HER2-, Oncotype DX score: 23) - Signed by FTruitt Merle MD on 08/23/2017 Neoadjuvant therapy: No Nuclear grade: G2 Multigene prognostic tests performed: Oncotype DX Recurrence score range: Greater than or equal to 11 Histologic grading system: 3 grade system Residual tumor (R): R0 - None Laterality: Left     Malignant  neoplasm of upper-inner quadrant of left breast in female, estrogen receptor positive ([place])  06/17/2017 Mammogram   [place] and MM diagnostic Breast Tomo Bilateral  IMPRESSION: 1. 3.4 palpable mass in the 10 o'clock position of the left breast is  highly suspicious for malignancy.  2. No evidence of malignancy in the right breast.  3. Ultrasound of the left axilla shows normal sized axillary lymph nodes.   06/18/2017 Initial Biopsy   Diagnosis 06/18/17 Breast, left, needle core biopsy, 10:00 o'clock - INVASIVE MAMMARY CARCINOMA. At least G2  The malignant cells are negative for E-Cadherin, supporting a lobular phenotype.   06/18/2017 Initial Diagnosis   Malignant neoplasm of upper-inner quadrant of left breast in female, estrogen receptor positive ([place])   06/18/2017 Receptors her2   ER 95%+, PR 25%+, both strong staining  HER2- Ki67 30%    07/30/2017 Surgery   LEFT TOTAL MASTECTOMY WITH AXILLARY SENTINEL LYMPH NODE BIOPSY by Dr. Donne Hazel on 07/30/17   07/30/2017 Pathology Results   Diagnosis 07/30/17 1. Breast, simple mastectomy, Left Total - INVASIVE LOBULAR CARCINOMA, GRADE 2, SPANNING 3.5 CM. - LOBULAR CARCINOMA IN SITU. - INVASIVE CARCINOMA IS BROADLY 0.1 CM FROM POSTERIOR MARGIN. - PERINEURAL INVASION PRESENT. - SEE ONCOLOGY TABLE. 2. Lymph node, sentinel, biopsy, Left Axillary - ONE OF ONE LYMPH NODES NEGATIVE FOR CARCINOMA (0/1). 3. Lymph node, sentinel, biopsy, Left - ONE OF ONE LYMPH NODES NEGATIVE FOR CARCINOMA (0/1).   07/30/2017 Oncotype testing   Oncotype 07/30/17 Recurrence score is 23, an intermediate risk With a 10-year risk of recurrence at 15% with tamoxifen alone.     11/11/2020 Imaging   MRI Lumbar Spine  IMPRESSION: Diffuse osseous metastases. Chronic inferior L2 endplate deformity with mild height loss.   Likely late subacute pathologic fracture deformities involving the superior L1 endplate and right L3 lamina.   Multilevel spondylosis. Moderate to severe spinal canal and neural foraminal narrowing at the L3-4 and L4-5 levels.   Mild to moderate spinal canal and neural foraminal narrowing at the L5-S1 level.   11/28/2020 PET scan   IMPRESSION: 1. Widespread and diffuse hypermetabolic bony  metastases. 2. No evidence for soft tissue metastases in the neck, chest, abdomen, or pelvis. 3.  Aortic Atherosclerois (ICD10-170.0)     11/29/2020 -  Chemotherapy   Zometa q63month starting 11/29/20. Held after first dose. Given her CKD will only give when she has hypercalcemia and may switch to Prolia.     12/04/2020 Imaging   MRI Brain  IMPRESSION: No evidence of intracranial metastatic disease.   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   Mild chronic microvascular ischemic changes. Small chronic right cerebellar infarct.     12/07/2020 - 12/21/2020 Radiation Therapy   palliative RT to left hip with Dr MLisbeth Renshawon 12/07/20-12/21/20   12/09/2020 Pathology Results   FINAL MICROSCOPIC DIAGNOSIS:   A. BONE, BIOPSY:  - Metastatic carcinoma.  - See comment.   COMMENT:  The morphology is compatible with metastatic lobular breast carcinoma.  Estrogen receptor, progesterone receptor and HER2/neu will be performed.   ADDENDUM:   PROGNOSTIC INDICATOR RESULTS:   Immunohistochemical and morphometric analysis performed manually   The tumor cells are EQUIVOCAL for Her2 (2+).  Her2 by FISH will be performed and the results will be reported  separately.   Estrogen Receptor:       POSITIVE, 50%, MODERATE-STRONG STAINING  Progesterone Receptor:   POSITIVE, 15%, MODERATE-STRONG STAINING    11/2020 -  Anti-estrogen oral therapy   First-line Anti-estrogen therapy  Anastrozole 45m daily starting in 11/2020 and Ibrance starting in 12/2020    01/09/2021 -  Chemotherapy   Ibrance 3 weeks on/1 weeks off starting beginning of 01/09/21. Reduced to 726mstarting with C2 (02/13/21) due to cytopenia and fatigue. Reduced to 7566m weeks on/1 week off starting C3 on 03/13/21)    03/31/2021 PET scan   IMPRESSION: 1. Dramatic reduction in metabolic activity throughout the skeleton. Metabolic activity of diffuse sclerotic skeletal lesions is now essentially at background blood pool level. No  focal metabolic activity. 2. Persistent diffuse confluent sclerotic skeletal metastasis unchanged. 3. No evidence of soft tissue breast cancer metastasis.   10/13/2021 PET scan   IMPRESSION: 1. Widespread osseous metastatic disease again noted with areas of minimally increased metabolic activity within the sternal manubrium and L2 vertebral body. 2. No evidence of extra osseous metastatic disease.      INTERVAL HISTORY:  RosKASSADI PRESSWOODs contacted for a follow up of metastatic breast cancer. She was last seen by me on 08/06/22.  She reports she is doing well except for persistent/chronic low back pain.   All other systems were reviewed with the patient and are negative.  MEDICAL HISTORY:  Past Medical History:  Diagnosis Date   CHICKENPOX, HX OF 03/21/2010   Qualifier: Diagnosis of  By: BraMarca AnconaA, Lucy     CVA (cerebral infarction) 02/2010   no residual deficits, a/w mid basal art stenosis, declined IC stent as rec by IR/neuro   Dyslipidemia    intol of statin due to >3x increase LFTs   GERD (gastroesophageal reflux disease)    hx   Hypertension, essential    Malignant neoplasm of upper-inner quadrant of left breast in female, estrogen receptor positive ([facility]/26/2018   Osteoarthritis    [facility]   SURGICAL HISTORY: Past Surgical History:  Procedure Laterality Date   CHOLECYSTECTOMY  1981   MASTECTOMY W/ SENTINEL NODE BIOPSY Left 07/30/2017   Procedure: LEFT TOTAL MASTECTOMY WITH AXILLARY SENTINEL LYMPH NODE BIOPSY;  Surgeon: WakRolm BookbinderD;  [facility];  Laterality: Left;    I have reviewed the social history and family history with the patient and they are unchanged from previous note.  ALLERGIES:  is allergic to influenza vaccines, pneumococcal vaccines, simvastatin, tetanus toxoids, and zoster vaccine live.  MEDICATIONS:  Current Outpatient Medications  Medication Sig Dispense Refill   abemaciclib (VERZENIO) 100 MG tablet Take  1 tablet (100 mg total) by mouth 2 (two) times daily. Start on 10/22/2022 56 tablet 0   acetaminophen (TYLENOL) 650 MG CR tablet Take 1,300 mg by mouth every 8 (eight) hours as needed for pain.     anastrozole (ARIMIDEX) 1 MG tablet TAKE 1 TABLET(1 MG) BY MOUTH DAILY 90 tablet 3   aspirin 81 MG tablet Take 81 mg by mouth 2 (two) times a week.     clopidogrel (PLAVIX) 75 MG tablet TAKE 1 TABLET(75 MG) BY MOUTH DAILY 90 tablet 1   furosemide (LASIX) 20 MG tablet Take 20 mg by mouth daily as needed for fluid.     lisinopril (ZESTRIL) 10 MG tablet TAKE 1 TABLET(10 MG) BY MOUTH DAILY 90 tablet 1   loratadine (CLARITIN) 10 MG tablet Take 10 mg by mouth daily as needed for allergies.     Menthol, Topical Analgesic, (BIOFREEZE EX) Apply 1 application topically daily as needed (muscle pain).     Multiple Vitamin (MULTIVITAMIN) capsule Take 1 capsule by mouth daily.  palbociclib (IBRANCE) 100 MG capsule Take 1 capsule (100 mg total) by mouth daily with breakfast. Take whole with food. Take for 14 days on, 7 days off, repeat every 21 days. 14 capsule 2   No current facility-administered medications for this visit.    PHYSICAL EXAMINATION: ECOG PERFORMANCE STATUS: 1 - Symptomatic but completely ambulatory  There were no vitals filed for this visit. Wt Readings from Last 3 Encounters:  09/21/22 146 lb 3.2 oz (66.3 kg)  08/06/22 144 lb 1.6 oz (65.4 kg)  06/04/22 143 lb 3.2 oz (65 kg)     No vitals taken today, Exam not performed today  LABORATORY DATA:  I have reviewed the data as listed    Latest Ref Rng & Units 08/06/2022   11:11 AM 06/04/2022   11:15 AM 05/11/2022    2:43 PM  CBC  WBC 4.0 - 10.5 K/uL 1.8  1.7  1.9   Hemoglobin 12.0 - 15.0 g/dL 10.2  9.5  9.8   Hematocrit 36.0 - 46.0 % 29.1  27.5  28.3   Platelets 150 - 400 K/uL 138  97  88         Latest Ref Rng & Units 08/06/2022   11:11 AM 06/04/2022   11:15 AM 05/11/2022    2:43 PM  CMP  Glucose 70 - 99 mg/dL 113  85  91   BUN 8  - 23 mg/dL 40  28  39   Creatinine 0.44 - 1.00 mg/dL 1.90  1.54  1.51   Sodium 135 - 145 mmol/L 141  142  144   Potassium 3.5 - 5.1 mmol/L 5.0  4.3  4.5   Chloride 98 - 111 mmol/L 108  111  111   CO2 22 - 32 mmol/L _0 Calcium 8.9 - 10.3 mg/dL 9.8  9.1  9.9   Total Protein 6.5 - 8.1 g/dL 7.6  7.3  7.3   Total Bilirubin 0.3 - 1.2 mg/dL 0.4  0.5  0.5   Alkaline Phos 38 - 126 U/L 128  148  122   AST 15 - 41 U/L _1 ALT 0 - 44 U/L _2 RADIOGRAPHIC STUDIES: I have personally reviewed the radiological images as listed and agreed with the findings in the report. No results found.    No orders of the defined types were placed in this encounter.  All questions were answered. The patient knows to call the clinic with any problems, questions or concerns. No barriers to learning was detected. The total time spent in the appointment was 25 minutes.     Truitt Merle, MD 10/04/2022   I, Wilburn Mylar, am acting as scribe for Truitt Merle, MD.   I have reviewed the above documentation for accuracy and completeness, and I agree with the above.

## 2022-10-04 NOTE — Telephone Encounter (Addendum)
Oral Oncology Pharmacist Encounter  Received new prescription for Verzenio (abemaciclib) for the treatment of metastatic ER/PR positive, HER-2 negative breast cancer in conjunction with fulvestrant, planned duration until disease progression or unacceptable drug toxicity.  CMP and CBC w/ Diff from 08/06/22 assessed, patient at the time with Scr of 1.90 (CrCl ~23.9 mL/min). No renal dose adjustments required for Verzenio. At that time, patient also with ANC of 700. Prescription dose and frequency assessed - patient starting on low dose per MD.   Current medication list in Epic reviewed, no relevant/significant DDIs with Verzenio identified.  Evaluated chart and no patient barriers to medication adherence noted.   Patient agreement for treatment documented in MD note on 10/04/22.  Prescription has been [facility]scribed to [facility] for benefits analysis and approval.  Oral Oncology Clinic will continue to follow for insurance authorization, copayment issues, initial counseling and start date.  Leron Croak, PharmD, BCPS, [facility] Hematology/Oncology Clinical Pharmacist [facility] and [place] [phone] 10/04/2022 11:58 AM

## 2022-10-05 ENCOUNTER — Telehealth: Payer: Self-pay | Admitting: Hematology

## 2022-10-05 ENCOUNTER — Telehealth: Payer: Self-pay | Admitting: Pharmacy Technician

## 2022-10-05 NOTE — Telephone Encounter (Signed)
Oral Oncology Patient Advocate Encounter   Began application for assistance for Verzenio through [employer].   Application will be submitted upon completion of necessary supporting documentation.   [employer] number [phone].   I will continue to check the status until final determination.   Lady Deutscher, CPhT-Adv Oncology Pharmacy Patient [place] Direct Number: [phone]  Fax: [phone]

## 2022-10-05 NOTE — Telephone Encounter (Signed)
Spoke with patient regarding upcoming appointments  

## 2022-10-10 ENCOUNTER — Other Ambulatory Visit: Payer: Self-pay | Admitting: Hematology

## 2022-10-12 ENCOUNTER — Inpatient Hospital Stay: Payer: Medicare FFS

## 2022-10-12 ENCOUNTER — Other Ambulatory Visit: Payer: Self-pay

## 2022-10-12 VITALS — BP 194/64 | HR 75 | Temp 97.8°F | Resp 16

## 2022-10-12 DIAGNOSIS — Z170 Estrogen receptor positive status [ER+]: Secondary | ICD-10-CM

## 2022-10-12 DIAGNOSIS — Z5111 Encounter for antineoplastic chemotherapy: Secondary | ICD-10-CM | POA: Diagnosis not present

## 2022-10-12 MED ORDER — FULVESTRANT 250 MG/5ML IM SOSY
500.0000 mg | PREFILLED_SYRINGE | Freq: Once | INTRAMUSCULAR | Status: AC
  Administered 2022-10-12: 500 mg via INTRAMUSCULAR
  Filled 2022-10-12: qty 10

## 2022-10-15 MED ORDER — ABEMACICLIB 100 MG PO TABS: 100.0000 mg | ORAL_TABLET | Freq: Two times a day (BID) | ORAL | 0 refills | Status: DC

## 2022-10-15 NOTE — Telephone Encounter (Signed)
Oral Oncology Patient Advocate Encounter  Reached out and spoke with patient regarding PAP paperwork, explained that I would send it to their preferred email via [place] portal.  Email confirmed:  [email or web address]   [id number]  Lady Deutscher, [place] Patient [place] Direct Number: [phone]  Fax: [phone]

## 2022-10-16 NOTE — Telephone Encounter (Signed)
Oral Oncology Patient Advocate Encounter  Called to check status of assistance application for Verzenio through [employer].  Application is pending patient consent.   After following up with patient, email to sign consent was resent to: [email or web address]  WEB- [id number]  Patient to call with any questions.  Lady Deutscher, CPhT-Adv Oncology Pharmacy Patient [place] Direct Number: [phone]  Fax: [phone]

## 2022-10-17 NOTE — Telephone Encounter (Signed)
Oral Oncology Patient Advocate Encounter   Received notification that the application for assistance for Verzenio through [employer] has been approved.   [employer] number [phone].   Effective dates: 10/17/22 through 11/26/23  I have spoken to the patient.  Lady Deutscher, CPhT-Adv Oncology Pharmacy Patient [place] Direct Number: [phone]  Fax: [phone]

## 2022-10-19 NOTE — Telephone Encounter (Addendum)
Oral Chemotherapy Pharmacist Encounter  I spoke with patient for overview of: Verzenio (abemaciclib) for the treatment of metastatic ER/PR positive, HER-2 negative breast cancer in conjunction with fulvestrant, planned duration until disease progression or unacceptable drug toxicity.   Counseled patient on administration, dosing, side effects, monitoring, drug-food interactions, safe handling, storage, and disposal.  Patient will take Verzenio 144m tablets, 1 tablet by mouth twice daily without regard to food.  Patient knows to avoid grapefruit and grapefruit juice.  Verzenio start date: patient will start once she has received medication in hand - patient provided with pharmacy's phone number ([phone]  Adverse effects include but are not limited to: diarrhea, fatigue, nausea, abdominal pain, decreased blood counts, and increased liver function tests, and joint pains. Severe, life-threatening, and/or fatal interstitial lung disease (ILD) and/or pneumonitis may occur with CDK 4/6 inhibitors. Nausea: Patient states she still has has anti-emetic on hand at home that is not expired, per patient, and knows to take it if nausea develops.   Diarrhea: Patient has Imodium (loperamide) on hand at home to use for diarrhea and endorses understanding of how to take this. She will follow directions as instructed on the box if diarrhea occurs (2 mg/dose, 2 doses after first loose stool and 1 dose after each subsequent watery stool, not to exceed 142mday). Patient knows to alert the office if they experience 4 or more episodes of diarrhea over their baseline bowel movements within 24 hours.   Reviewed with patient importance of keeping a medication schedule and plan for any missed doses. No barriers to medication adherence identified.  Medication reconciliation performed and medication/allergy list updated. Patient had not yet stopped anastrozole at the time of our phone call, we discussed that the  fulvestrant injections she is receiving (first dose 10/12/22) are taking the place of anastrozole. She expressed understanding and will discontinue the anastrozole as of today.   All questions answered.  Ms. LaMontuorioiced understanding and appreciation.   Medication education handout placed in mail for patient. Patient knows to call the office with questions or concerns. Oral Chemotherapy Clinic phone number provided to patient.   ReLeron CroakPharmD, BCPS, [facility] LLCematology/Oncology Clinical Pharmacist [place] [phone]/24/2023 9:42 AM

## 2022-10-24 ENCOUNTER — Other Ambulatory Visit (HOSPITAL_COMMUNITY): Payer: Self-pay

## 2022-10-25 NOTE — Progress Notes (Signed)
[place]   [phone] [phone]   Clinic Follow up Note   Patient Care Team: Binnie Rail, MD as PCP - General (Internal Medicine) Garvin Fila, MD as Consulting Physician (Neurology) Rolm Bookbinder, MD as Consulting Physician (General Surgery) Truitt Merle, MD as Consulting Physician (Hematology) Kyung Rudd, MD as Consulting Physician (Radiation Oncology)  Date of Service:  10/26/2022  CHIEF COMPLAINT: f/u of metastatic breast cancer   CURRENT THERAPY:  -Fulvestrant injection started on October 12, 2022 -Verzenio 100 mg twice daily started on October 25, 2022   ASSESSMENT:  Shirley Wells is a 81 y.o. 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 S/p left total mastectomy. She declined antiestrogen therapy due to concerns of side effects. She lost follow up after 07/2017.   -After 6 months of left hip and ribcage pain she was found to have diffuse bone metastasis on 11/28/20 PET. This was confirmed on 12/09/20 bone biopsy --she started first-line Anastrozole in 11/2020 and Ibrance on 01/09/21, not very compliant with Ibrance  -progressed on PET 10/03/2022 -changed her tx to fulvestrant injection and Verzenio   Metastasis to bone [facility]) -with L1/L3 fracture, hypercalcemia -S/p palliative RT with Dr Lisbeth Renshaw 1/12-1/26/22 for severe left hip pain. -She received one dose of Zometa on 11/29/20 for hypercalcinemia, but given her CKD this has been held. Her calcium has been WNL to low since Zometa injection.     PLAN: -lab reviewed, mild pancytopenia  -lab and fulvestrant in 2wks and 6 wks  F/u in [place]: Oncology History Overview Note   Cancer Staging  Malignant neoplasm of upper-inner  quadrant of left breast in female, estrogen receptor positive ([place]) Staging form: Breast, AJCC 8th Edition - Clinical stage from 06/18/2017: Stage IB (cT2, cN0, cM0, G2, ER+, PR+, HER2-) - Signed by Truitt Merle, MD on 06/24/2017 Histologic grading system: 3 grade system - Pathologic stage from 07/30/2017: Stage IA (pT2, pN0, cM0, G2, ER+, PR+, HER2-, Oncotype DX score: 23) - Signed by Truitt Merle, MD on 08/23/2017 Neoadjuvant therapy: No Nuclear grade: G2 Multigene prognostic tests performed: Oncotype DX Recurrence score range: Greater than or equal to 11 Histologic grading system: 3 grade system Residual tumor (R): R0 - None Laterality: Left     Malignant neoplasm of upper-inner quadrant of left breast in female, estrogen receptor positive ([place])  06/17/2017 Mammogram   [place] and MM diagnostic Breast Tomo Bilateral  IMPRESSION: 1. 3.4 palpable mass in the 10 o'clock position of the left breast is highly suspicious for malignancy.  2. No evidence of malignancy in the right breast.  3. Ultrasound of the left axilla shows normal sized axillary lymph nodes.   06/18/2017 Initial Biopsy   Diagnosis 06/18/17 Breast, left, needle core biopsy, 10:00 o'clock - INVASIVE MAMMARY CARCINOMA. At least G2  The malignant cells are negative for E-Cadherin, supporting a lobular phenotype.   06/18/2017 Initial Diagnosis   Malignant neoplasm of upper-inner quadrant of left breast in female, estrogen receptor positive ([place])   06/18/2017 Receptors her2   ER 95%+, PR 25%+, both strong staining  HER2- Ki67 30%    07/30/2017 Surgery   LEFT TOTAL MASTECTOMY WITH AXILLARY SENTINEL LYMPH NODE BIOPSY by Dr. Donne Hazel on 07/30/17   07/30/2017 Pathology Results   Diagnosis  07/30/17 1. Breast, simple mastectomy, Left Total - INVASIVE LOBULAR CARCINOMA, GRADE 2, SPANNING 3.5 CM. - LOBULAR CARCINOMA IN SITU. - INVASIVE CARCINOMA IS BROADLY 0.1 CM FROM POSTERIOR MARGIN. - PERINEURAL INVASION PRESENT. - SEE ONCOLOGY TABLE. 2.  Lymph node, sentinel, biopsy, Left Axillary - ONE OF ONE LYMPH NODES NEGATIVE FOR CARCINOMA (0/1). 3. Lymph node, sentinel, biopsy, Left - ONE OF ONE LYMPH NODES NEGATIVE FOR CARCINOMA (0/1).   07/30/2017 Oncotype testing   Oncotype 07/30/17 Recurrence score is 23, an intermediate risk With a 10-year risk of recurrence at 15% with tamoxifen alone.     11/11/2020 Imaging   MRI Lumbar Spine  IMPRESSION: Diffuse osseous metastases. Chronic inferior L2 endplate deformity with mild height loss.   Likely late subacute pathologic fracture deformities involving the superior L1 endplate and right L3 lamina.   Multilevel spondylosis. Moderate to severe spinal canal and neural foraminal narrowing at the L3-4 and L4-5 levels.   Mild to moderate spinal canal and neural foraminal narrowing at the L5-S1 level.   11/28/2020 PET scan   IMPRESSION: 1. Widespread and diffuse hypermetabolic bony metastases. 2. No evidence for soft tissue metastases in the neck, chest, abdomen, or pelvis. 3.  Aortic Atherosclerois (ICD10-170.0)     11/29/2020 -  Chemotherapy   Zometa q55month starting 11/29/20. Held after first dose. Given her CKD will only give when she has hypercalcemia and may switch to Prolia.     12/04/2020 Imaging   MRI Brain  IMPRESSION: No evidence of intracranial metastatic disease.   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   Mild chronic microvascular ischemic changes. Small chronic right cerebellar infarct.     12/07/2020 - 12/21/2020 Radiation Therapy   palliative RT to left hip with Dr MLisbeth Renshawon 12/07/20-12/21/20   12/09/2020 Pathology Results   FINAL MICROSCOPIC DIAGNOSIS:   A. BONE, BIOPSY:  - Metastatic carcinoma.  - See comment.   COMMENT:  The morphology is compatible with metastatic lobular breast carcinoma.  Estrogen receptor, progesterone receptor and HER2/neu will be performed.   ADDENDUM:   PROGNOSTIC INDICATOR RESULTS:   Immunohistochemical and  morphometric analysis performed manually   The tumor cells are EQUIVOCAL for Her2 (2+).  Her2 by FISH will be performed and the results will be reported  separately.   Estrogen Receptor:       POSITIVE, 50%, MODERATE-STRONG STAINING  Progesterone Receptor:   POSITIVE, 15%, MODERATE-STRONG STAINING    11/2020 -  Anti-estrogen oral therapy   First-line Anti-estrogen therapy Anastrozole 15mdaily starting in 11/2020 and Ibrance starting in 12/2020    01/09/2021 -  Chemotherapy   Ibrance 3 weeks on/1 weeks off starting beginning of 01/09/21. Reduced to 7536mtarting with C2 (02/13/21) due to cytopenia and fatigue. Reduced to 58m56mweeks on/1 week off starting C3 on 03/13/21)    03/31/2021 PET scan   IMPRESSION: 1. Dramatic reduction in metabolic activity throughout the skeleton. Metabolic activity of diffuse sclerotic skeletal lesions is now essentially at background blood pool level. No focal metabolic activity. 2. Persistent diffuse confluent sclerotic skeletal metastasis unchanged. 3. No evidence of soft tissue breast cancer metastasis.   10/13/2021 PET scan   IMPRESSION: 1. Widespread osseous metastatic disease again noted with areas of minimally increased metabolic activity within the sternal manubrium and L2 vertebral body. 2. No evidence of extra osseous metastatic disease.      INTERVAL HISTORY:  Shirley RIGGENBACHhere for a follow up of metastatic breast cancer  She was last  seen by me on 08/06/2022 She presents to the clinic accompanied by daughter. Pt reports of sore muscle after the first dose of treatment. Pt reports of having nausea after taking Verzenio. Pt states she usually eat breakfast in morning encourage to eat a little more before taking meds.    All other systems were reviewed with the patient and are negative.  MEDICAL HISTORY:  Past Medical History:  Diagnosis Date   CHICKENPOX, HX OF 03/21/2010   Qualifier: Diagnosis of  By: Marca Ancona RMA, Lucy     CVA (cerebral  infarction) 02/2010   no residual deficits, a/w mid basal art stenosis, declined IC stent as rec by IR/neuro   Dyslipidemia    intol of statin due to >3x increase LFTs   GERD (gastroesophageal reflux disease)    hx   Hypertension, essential    Malignant neoplasm of upper-inner quadrant of left breast in female, estrogen receptor positive ([place]) 06/20/2017   Osteoarthritis    Stroke [facility])     SURGICAL HISTORY: Past Surgical History:  Procedure Laterality Date   CHOLECYSTECTOMY  1981   MASTECTOMY W/ SENTINEL NODE BIOPSY Left 07/30/2017   Procedure: LEFT TOTAL MASTECTOMY WITH AXILLARY SENTINEL LYMPH NODE BIOPSY;  Surgeon: Rolm Bookbinder, MD;  Location: [place];  Service: General;  Laterality: Left;    I have reviewed the social history and family history with the patient and they are unchanged from previous note.  ALLERGIES:  is allergic to influenza vaccines, pneumococcal vaccines, simvastatin, tetanus toxoids, and zoster vaccine live.  MEDICATIONS:  Current Outpatient Medications  Medication Sig Dispense Refill   abemaciclib (VERZENIO) 100 MG tablet Take 1 tablet (100 mg total) by mouth 2 (two) times daily. Start on 10/22/2022 56 tablet 0   acetaminophen (TYLENOL) 650 MG CR tablet Take 1,300 mg by mouth every 8 (eight) hours as needed for pain.     aspirin 81 MG tablet Take 81 mg by mouth 2 (two) times a week.     clopidogrel (PLAVIX) 75 MG tablet TAKE 1 TABLET(75 MG) BY MOUTH DAILY 90 tablet 1   furosemide (LASIX) 20 MG tablet Take 20 mg by mouth daily as needed for fluid.     lisinopril (ZESTRIL) 10 MG tablet TAKE 1 TABLET(10 MG) BY MOUTH DAILY 90 tablet 1   loratadine (CLARITIN) 10 MG tablet Take 10 mg by mouth daily as needed for allergies.     Menthol, Topical Analgesic, (BIOFREEZE EX) Apply 1 application topically daily as needed (muscle pain).     milk thistle 175 MG tablet Take 175 mg by mouth daily.     Multiple Vitamin (MULTIVITAMIN) capsule Take 1 capsule by mouth daily.      No current facility-administered medications for this visit.    PHYSICAL EXAMINATION: ECOG PERFORMANCE STATUS: 1 - Symptomatic but completely ambulatory  Vitals:   10/26/22 1346  BP: (!) 155/76  Pulse: 84  Resp: 18  Temp: 97.7 F (36.5 C)  SpO2: 100%   Wt Readings from Last 3 Encounters:  10/26/22 145 lb (65.8 kg)  09/21/22 146 lb 3.2 oz (66.3 kg)  08/06/22 144 lb 1.6 oz (65.4 kg)    GENERAL:alert, no distress and comfortable SKIN: skin color, texture, turgor are normal, no rashes or significant lesions EYES: normal, Conjunctiva are pink and non-injected, sclera clear NECK: supple, thyroid normal size, non-tender, without nodularity LYMPH:  no palpable lymphadenopathy in the cervical, axillary  LUNGS: clear to auscultation and percussion with normal breathing effort HEART: regular rate & rhythm and  no murmurs and no lower extremity edema(-) Rt Breast: Status post left mastectomy, no palpable nodule on chest wall.  Right breast exam is normal, no adenopathy.  Lower back pain left side  Dry skin itching(-) ABDOMEN:abdomen soft, non-tender and normal bowel sounds Musculoskeletal:no cyanosis of digits and no clubbing  NEURO: alert & oriented x 3 with fluent speech, no focal motor/sensory deficits  LABORATORY DATA:  I have reviewed the data as listed    Latest Ref Rng & Units 08/06/2022   11:11 AM 06/04/2022   11:15 AM 05/11/2022    2:43 PM  CBC  WBC 4.0 - 10.5 K/uL 1.8  1.7  1.9   Hemoglobin 12.0 - 15.0 g/dL 10.2  9.5  9.8   Hematocrit 36.0 - 46.0 % 29.1  27.5  28.3   Platelets 150 - 400 K/uL 138  97  88         Latest Ref Rng & Units 08/06/2022   11:11 AM 06/04/2022   11:15 AM 05/11/2022    2:43 PM  CMP  Glucose 70 - 99 mg/dL 113  85  91   BUN 8 - 23 mg/dL 40  28  39   Creatinine 0.44 - 1.00 mg/dL 1.90  1.54  1.51   Sodium 135 - 145 mmol/L 141  142  144   Potassium 3.5 - 5.1 mmol/L 5.0  4.3  4.5   Chloride 98 - 111 mmol/L 108  111  111   CO2 22 - 32 mmol/L _0 Calcium 8.9 - 10.3 mg/dL 9.8  9.1  9.9   Total Protein 6.5 - 8.1 g/dL 7.6  7.3  7.3   Total Bilirubin 0.3 - 1.2 mg/dL 0.4  0.5  0.5   Alkaline Phos 38 - 126 U/L 128  148  122   AST 15 - 41 U/L _1 ALT 0 - 44 U/L _2 RADIOGRAPHIC STUDIES: I have personally reviewed the radiological images as listed and agreed with the findings in the report. No results found.    Orders Placed This Encounter  Procedures   CBC with Differential/Platelet    Standing Status:   Standing    Number of Occurrences:   50    Standing Expiration Date:   10/27/2023   Comprehensive metabolic panel    Standing Status:   Standing    Number of Occurrences:   50    Standing Expiration Date:   10/27/2023   All questions were answered. The patient knows to call the clinic with any problems, questions or concerns. No barriers to learning was detected. The total time spent in the appointment was 30 minutes.     Truitt Merle, MD 10/26/2022   Felicity Coyer, CMA, am acting as scribe for Truitt Merle, MD.   I have reviewed the above documentation for accuracy and completeness, and I agree with the above.

## 2022-10-26 ENCOUNTER — Other Ambulatory Visit: Payer: Medicare FFS

## 2022-10-26 ENCOUNTER — Inpatient Hospital Stay: Payer: Medicare FFS | Attending: Hematology

## 2022-10-26 ENCOUNTER — Inpatient Hospital Stay: Payer: Medicare FFS

## 2022-10-26 ENCOUNTER — Other Ambulatory Visit: Payer: Self-pay

## 2022-10-26 ENCOUNTER — Encounter: Payer: Self-pay | Admitting: Hematology

## 2022-10-26 ENCOUNTER — Inpatient Hospital Stay (HOSPITAL_BASED_OUTPATIENT_CLINIC_OR_DEPARTMENT_OTHER): Payer: Medicare FFS | Admitting: Hematology

## 2022-10-26 DIAGNOSIS — Z5111 Encounter for antineoplastic chemotherapy: Secondary | ICD-10-CM | POA: Diagnosis present

## 2022-10-26 DIAGNOSIS — Z170 Estrogen receptor positive status [ER+]: Secondary | ICD-10-CM | POA: Insufficient documentation

## 2022-10-26 DIAGNOSIS — N189 Chronic kidney disease, unspecified: Secondary | ICD-10-CM | POA: Diagnosis not present

## 2022-10-26 DIAGNOSIS — C7951 Secondary malignant neoplasm of bone: Secondary | ICD-10-CM

## 2022-10-26 DIAGNOSIS — C50212 Malignant neoplasm of upper-inner quadrant of left female breast: Secondary | ICD-10-CM

## 2022-10-26 DIAGNOSIS — Z79811 Long term (current) use of aromatase inhibitors: Secondary | ICD-10-CM | POA: Diagnosis not present

## 2022-10-26 DIAGNOSIS — D61818 Other pancytopenia: Secondary | ICD-10-CM | POA: Insufficient documentation

## 2022-10-26 DIAGNOSIS — Z9012 Acquired absence of left breast and nipple: Secondary | ICD-10-CM | POA: Diagnosis not present

## 2022-10-26 DIAGNOSIS — I129 Hypertensive chronic kidney disease with stage 1 through stage 4 chronic kidney disease, or unspecified chronic kidney disease: Secondary | ICD-10-CM | POA: Diagnosis not present

## 2022-10-26 MED ORDER — FULVESTRANT 250 MG/5ML IM SOSY
500.0000 mg | PREFILLED_SYRINGE | Freq: Once | INTRAMUSCULAR | Status: AC
  Administered 2022-10-26: 500 mg via INTRAMUSCULAR
  Filled 2022-10-26: qty 10

## 2022-10-26 NOTE — Assessment & Plan Note (Signed)
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 S/p left total mastectomy. She declined antiestrogen therapy due to concerns of side effects. She lost follow up after 07/2017.   -After 6 months of left hip and ribcage pain she was found to have diffuse bone metastasis on 11/28/20 PET. This was confirmed on 12/09/20 bone biopsy --she started first-line Anastrozole in 11/2020 and Ibrance on 01/09/21, not very compliant with Ibrance  -progressed on PET 10/03/2022 -changed her tx to fulvestrant injection and Verzenio

## 2022-10-26 NOTE — Assessment & Plan Note (Signed)
-  with L1/L3 fracture, hypercalcemia -S/p palliative RT with Dr Lisbeth Renshaw 1/12-1/26/22 for severe left hip pain. -She received one dose of Zometa on 11/29/20 for hypercalcinemia, but given her CKD this has been held. Her calcium has been WNL to low since Zometa injection.

## 2022-10-27 LAB — CANCER ANTIGEN 27.29: CA 27.29: 100.9 U/mL — ABNORMAL HIGH (ref 0.0–38.6)

## 2022-10-31 ENCOUNTER — Telehealth: Payer: Medicare FFS | Admitting: Hematology

## 2022-11-09 ENCOUNTER — Inpatient Hospital Stay: Payer: Medicare FFS

## 2022-11-09 ENCOUNTER — Other Ambulatory Visit: Payer: Self-pay

## 2022-11-09 VITALS — BP 154/57 | HR 81 | Temp 97.9°F | Resp 18

## 2022-11-09 DIAGNOSIS — C50212 Malignant neoplasm of upper-inner quadrant of left female breast: Secondary | ICD-10-CM

## 2022-11-09 DIAGNOSIS — Z5111 Encounter for antineoplastic chemotherapy: Secondary | ICD-10-CM | POA: Diagnosis not present

## 2022-11-09 LAB — COMPREHENSIVE METABOLIC PANEL
ALT: 13 U/L (ref 0–44)
AST: 19 U/L (ref 15–41)
Albumin: 3.6 g/dL (ref 3.5–5.0)
Alkaline Phosphatase: 138 U/L — ABNORMAL HIGH (ref 38–126)
Anion gap: 8 (ref 5–15)
BUN: 36 mg/dL — ABNORMAL HIGH (ref 8–23)
CO2: 20 mmol/L — ABNORMAL LOW (ref 22–32)
Calcium: 9.1 mg/dL (ref 8.9–10.3)
Chloride: 114 mmol/L — ABNORMAL HIGH (ref 98–111)
Creatinine, Ser: 2.21 mg/dL — ABNORMAL HIGH (ref 0.44–1.00)
GFR, Estimated: 22 mL/min — ABNORMAL LOW (ref >60)
Glucose, Bld: 107 mg/dL — ABNORMAL HIGH (ref 70–99)
Potassium: 4.4 mmol/L (ref 3.5–5.1)
Sodium: 142 mmol/L (ref 135–145)
Total Bilirubin: 0.3 mg/dL (ref 0.3–1.2)
Total Protein: 6.5 g/dL (ref 6.5–8.1)

## 2022-11-09 LAB — CBC WITH DIFFERENTIAL/PLATELET
Abs Immature Granulocytes: 0.01 10*3/uL (ref 0.00–0.07)
Basophils Absolute: 0.1 10*3/uL (ref 0.0–0.1)
Basophils Relative: 2 %
Eosinophils Absolute: 0.1 10*3/uL (ref 0.0–0.5)
Eosinophils Relative: 4 %
HCT: 25.6 % — ABNORMAL LOW (ref 36.0–46.0)
Hemoglobin: 9.2 g/dL — ABNORMAL LOW (ref 12.0–15.0)
Immature Granulocytes: 0 %
Lymphocytes Relative: 30 %
Lymphs Abs: 0.8 10*3/uL (ref 0.7–4.0)
MCH: 43.6 pg — ABNORMAL HIGH (ref 26.0–34.0)
MCHC: 35.9 g/dL (ref 30.0–36.0)
MCV: 121.3 fL — ABNORMAL HIGH (ref 80.0–100.0)
Monocytes Absolute: 0.2 10*3/uL (ref 0.1–1.0)
Monocytes Relative: 8 %
Neutro Abs: 1.5 10*3/uL — ABNORMAL LOW (ref 1.7–7.7)
Neutrophils Relative %: 56 %
Platelets: 135 10*3/uL — ABNORMAL LOW (ref 150–400)
RBC: 2.11 MIL/uL — ABNORMAL LOW (ref 3.87–5.11)
RDW: 13.8 % (ref 11.5–15.5)
Smear Review: NORMAL
WBC: 2.6 10*3/uL — ABNORMAL LOW (ref 4.0–10.5)
nRBC: 0 % (ref 0.0–0.2)

## 2022-11-09 MED ORDER — FULVESTRANT 250 MG/5ML IM SOSY
500.0000 mg | PREFILLED_SYRINGE | Freq: Once | INTRAMUSCULAR | Status: AC
  Administered 2022-11-09: 500 mg via INTRAMUSCULAR
  Filled 2022-11-09: qty 10

## 2022-11-12 ENCOUNTER — Other Ambulatory Visit: Payer: Self-pay | Admitting: Hematology

## 2022-11-12 DIAGNOSIS — C50212 Malignant neoplasm of upper-inner quadrant of left female breast: Secondary | ICD-10-CM

## 2022-11-22 ENCOUNTER — Telehealth: Payer: Self-pay

## 2022-11-22 NOTE — Telephone Encounter (Addendum)
Called patient and relayed message and made sure she had labs for mid January appointment     ----- Message from Truitt Merle, MD sent at 11/21/2022  9:23 AM EST ----- Please let pt know her Cr has increased lately and encourage her to drink more fluids, make sure she has lab appointment on next visit in mid Jan, thx   Truitt Merle

## 2022-12-06 ENCOUNTER — Other Ambulatory Visit: Payer: Self-pay | Admitting: Internal Medicine

## 2022-12-06 NOTE — Assessment & Plan Note (Signed)
-  with L1/L3 fracture, hypercalcemia -S/p palliative RT with Dr Lisbeth Renshaw 1/12-1/26/22 for severe left hip pain. -She received one dose of Zometa on 11/29/20 for hypercalcinemia, but given her CKD this has been held. Her calcium has been WNL to low since Zometa injection.

## 2022-12-06 NOTE — Progress Notes (Signed)
[place]   [phone] [phone]   Clinic Follow up Note   Patient Care Team: Binnie Rail, MD as PCP - General (Internal Medicine) Garvin Fila, MD as Consulting Physician (Neurology) Rolm Bookbinder, MD as Consulting Physician (General Surgery) Truitt Merle, MD as Consulting Physician (Hematology) Kyung Rudd, MD as Consulting Physician (Radiation Oncology)  Date of Service:  12/07/2022  CHIEF COMPLAINT: f/u of metastatic breast cancer   CURRENT THERAPY:  -Fulvestrant injection started on October 12, 2022 -Verzenio 100 mg twice daily started on October 25, 2022   ASSESSMENT:  Shirley Wells is a 82 y.o. 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 S/p left total mastectomy. She declined antiestrogen therapy due to concerns of side effects. She lost follow up after 07/2017.   -After 6 months of left hip and ribcage pain she was found to have diffuse bone metastasis on 11/28/20 PET. This was confirmed on 12/09/20 bone biopsy --she started first-line Anastrozole in 11/2020 and Ibrance on 01/09/21, not very compliant with Ibrance  -progressed on PET 10/03/2022 -changed her tx to fulvestrant injection and Verzenio , she is tolerating fulvestrant well, but has developed significant gastric discomfort, which has impacted her eating.  She previously tolerated Ibrance well, I will switch her Verzenio to Ibrance 100 mg daily for day 1-14 every 21 days. She agrees, she still has some Ibrance left over from previous, will start next week.  She will stop Verzenio, to see if her gastric discomfort will resolve in the next 3 to 4 days. -Plan to repeat a PET scan in about 2 months.  Metastasis to bone [facility]) -with L1/L3 fracture,  hypercalcemia -S/p palliative RT with Dr Lisbeth Renshaw 1/12-1/26/22 for severe left hip pain. -She received one dose of Zometa on 11/29/20 for hypercalcinemia, but given her CKD this has been held. Her calcium has been WNL to low since Zometa injection.     PLAN: -lab reviewed -Will proceed to injection today and continue every 4 weeks -I recommend to switch back to [place] '100mg'$  daily on day 1-14 every 21 days, due to GI side effects  -Discontinue Verzenio due to GI issues. -start Ibrance 12/10/2022 -Order PET scan to be done in 8 weeks -f/u phone visit a few days after PET scan.    SUMMARY OF ONCOLOGIC HISTORY: Oncology History Overview Note   Cancer Staging  Malignant neoplasm of upper-inner quadrant of left breast in female, estrogen receptor positive ([place]) Staging form: Breast, AJCC 8th Edition - Clinical stage from 06/18/2017: Stage IB (cT2, cN0, cM0, G2, ER+, PR+, HER2-) - Signed by Truitt Merle, MD on 06/24/2017 Histologic grading system: 3 grade system - Pathologic stage from 07/30/2017: Stage IA (pT2, pN0, cM0, G2, ER+, PR+, HER2-, Oncotype DX score: 23) - Signed by Truitt Merle, MD on 08/23/2017 Neoadjuvant therapy: No Nuclear grade: G2 Multigene prognostic tests performed: Oncotype DX Recurrence score range: Greater than or equal to 11 Histologic grading system: 3 grade system Residual tumor (R): R0 - None Laterality: Left     Malignant neoplasm of upper-inner quadrant of left breast in female, estrogen receptor positive ([place])  06/17/2017 Mammogram   [place] and MM diagnostic Breast Tomo Bilateral  IMPRESSION: 1. 3.4 palpable mass in the 10 o'clock position of the left breast is highly suspicious  for malignancy.  2. No evidence of malignancy in the right breast.  3. Ultrasound of the left axilla shows normal sized axillary lymph nodes.   06/18/2017 Initial Biopsy   Diagnosis 06/18/17 Breast, left, needle core biopsy, 10:00 o'clock - INVASIVE MAMMARY CARCINOMA. At least G2  The  malignant cells are negative for E-Cadherin, supporting a lobular phenotype.   06/18/2017 Initial Diagnosis   Malignant neoplasm of upper-inner quadrant of left breast in female, estrogen receptor positive ([place])   06/18/2017 Receptors her2   ER 95%+, PR 25%+, both strong staining  HER2- Ki67 30%    07/30/2017 Surgery   LEFT TOTAL MASTECTOMY WITH AXILLARY SENTINEL LYMPH NODE BIOPSY by Dr. Donne Hazel on 07/30/17   07/30/2017 Pathology Results   Diagnosis 07/30/17 1. Breast, simple mastectomy, Left Total - INVASIVE LOBULAR CARCINOMA, GRADE 2, SPANNING 3.5 CM. - LOBULAR CARCINOMA IN SITU. - INVASIVE CARCINOMA IS BROADLY 0.1 CM FROM POSTERIOR MARGIN. - PERINEURAL INVASION PRESENT. - SEE ONCOLOGY TABLE. 2. Lymph node, sentinel, biopsy, Left Axillary - ONE OF ONE LYMPH NODES NEGATIVE FOR CARCINOMA (0/1). 3. Lymph node, sentinel, biopsy, Left - ONE OF ONE LYMPH NODES NEGATIVE FOR CARCINOMA (0/1).   07/30/2017 Oncotype testing   Oncotype 07/30/17 Recurrence score is 23, an intermediate risk With a 10-year risk of recurrence at 15% with tamoxifen alone.     11/11/2020 Imaging   MRI Lumbar Spine  IMPRESSION: Diffuse osseous metastases. Chronic inferior L2 endplate deformity with mild height loss.   Likely late subacute pathologic fracture deformities involving the superior L1 endplate and right L3 lamina.   Multilevel spondylosis. Moderate to severe spinal canal and neural foraminal narrowing at the L3-4 and L4-5 levels.   Mild to moderate spinal canal and neural foraminal narrowing at the L5-S1 level.   11/28/2020 PET scan   IMPRESSION: 1. Widespread and diffuse hypermetabolic bony metastases. 2. No evidence for soft tissue metastases in the neck, chest, abdomen, or pelvis. 3.  Aortic Atherosclerois (ICD10-170.0)     11/29/2020 -  Chemotherapy   Zometa q25month starting 11/29/20. Held after first dose. Given her CKD will only give when she has hypercalcemia and may switch to Prolia.      12/04/2020 Imaging   MRI Brain  IMPRESSION: No evidence of intracranial metastatic disease.   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   Mild chronic microvascular ischemic changes. Small chronic right cerebellar infarct.     12/07/2020 - 12/21/2020 Radiation Therapy   palliative RT to left hip with Dr MLisbeth Renshawon 12/07/20-12/21/20   12/09/2020 Pathology Results   FINAL MICROSCOPIC DIAGNOSIS:   A. BONE, BIOPSY:  - Metastatic carcinoma.  - See comment.   COMMENT:  The morphology is compatible with metastatic lobular breast carcinoma.  Estrogen receptor, progesterone receptor and HER2/neu will be performed.   ADDENDUM:   PROGNOSTIC INDICATOR RESULTS:   Immunohistochemical and morphometric analysis performed manually   The tumor cells are EQUIVOCAL for Her2 (2+).  Her2 by FISH will be performed and the results will be reported  separately.   Estrogen Receptor:       POSITIVE, 50%, MODERATE-STRONG STAINING  Progesterone Receptor:   POSITIVE, 15%, MODERATE-STRONG STAINING    11/2020 -  Anti-estrogen oral therapy   First-line Anti-estrogen therapy Anastrozole '1mg'$  daily starting in 11/2020 and Ibrance starting in 12/2020    01/09/2021 -  Chemotherapy   Ibrance 3 weeks on/1 weeks off starting beginning of 01/09/21. Reduced to '75mg'$  starting with C2 (02/13/21) due to cytopenia and fatigue. Reduced  to '75mg'$  2 weeks on/1 week off starting C3 on 03/13/21)    03/31/2021 PET scan   IMPRESSION: 1. Dramatic reduction in metabolic activity throughout the skeleton. Metabolic activity of diffuse sclerotic skeletal lesions is now essentially at background blood pool level. No focal metabolic activity. 2. Persistent diffuse confluent sclerotic skeletal metastasis unchanged. 3. No evidence of soft tissue breast cancer metastasis.   10/13/2021 PET scan   IMPRESSION: 1. Widespread osseous metastatic disease again noted with areas of minimally increased metabolic activity within the  sternal manubrium and L2 vertebral body. 2. No evidence of extra osseous metastatic disease.      INTERVAL HISTORY:  Shirley Wells is here for a follow up of metastatic breast cancer  She was last seen by me on 10/26/2022 She presents to the clinic accompanied by daughter. Pt states she has a stomach upset since she started the Verzenio. The pt states it feels like a hunger pain but she couldn't eat. Pt states that she's being eating soup. Pt denies nausea and she burps a lot. Pt reports she is weaker. Pt states she have diarrhea  4 times a day.   All other systems were reviewed with the patient and are negative.  MEDICAL HISTORY:  Past Medical History:  Diagnosis Date   CHICKENPOX, HX OF 03/21/2010   Qualifier: Diagnosis of  By: Marca Ancona RMA, Lucy     CVA (cerebral infarction) 02/2010   no residual deficits, a/w mid basal art stenosis, declined IC stent as rec by IR/neuro   Dyslipidemia    intol of statin due to >3x increase LFTs   GERD (gastroesophageal reflux disease)    hx   Hypertension, essential    Malignant neoplasm of upper-inner quadrant of left breast in female, estrogen receptor positive ([place]) 06/20/2017   Osteoarthritis    Stroke [facility])     SURGICAL HISTORY: Past Surgical History:  Procedure Laterality Date   CHOLECYSTECTOMY  1981   MASTECTOMY W/ SENTINEL NODE BIOPSY Left 07/30/2017   Procedure: LEFT TOTAL MASTECTOMY WITH AXILLARY SENTINEL LYMPH NODE BIOPSY;  Surgeon: Rolm Bookbinder, MD;  Location: [place];  Service: General;  Laterality: Left;    I have reviewed the social history and family history with the patient and they are unchanged from previous note.  ALLERGIES:  is allergic to influenza vaccines, pneumococcal vaccines, simvastatin, tetanus toxoids, and zoster vaccine live.  MEDICATIONS:  Current Outpatient Medications  Medication Sig Dispense Refill   acetaminophen (TYLENOL) 650 MG CR tablet Take 1,300 mg by mouth every 8 (eight) hours as needed for pain.      aspirin 81 MG tablet Take 81 mg by mouth 2 (two) times a week.     clopidogrel (PLAVIX) 75 MG tablet TAKE 1 TABLET(75 MG) BY MOUTH DAILY 90 tablet 1   furosemide (LASIX) 20 MG tablet Take 20 mg by mouth daily as needed for fluid.     lisinopril (ZESTRIL) 10 MG tablet TAKE 1 TABLET(10 MG) BY MOUTH DAILY 90 tablet 1   loratadine (CLARITIN) 10 MG tablet Take 10 mg by mouth daily as needed for allergies.     Menthol, Topical Analgesic, (BIOFREEZE EX) Apply 1 application topically daily as needed (muscle pain).     milk thistle 175 MG tablet Take 175 mg by mouth daily.     Multiple Vitamin (MULTIVITAMIN) capsule Take 1 capsule by mouth daily.     VERZENIO 100 MG tablet TAKE 1 TABLET BY MOUTH TWICE DAILY. START ON 10/22/22 56 tablet 0  No current facility-administered medications for this visit.    PHYSICAL EXAMINATION: ECOG PERFORMANCE STATUS: 1 - Symptomatic but completely ambulatory  Vitals:   12/07/22 1319  BP: (!) 147/62  Pulse: 76  Resp: 16  Temp: 97.7 F (36.5 C)  SpO2: 100%   Wt Readings from Last 3 Encounters:  12/07/22 145 lb 6.4 oz (66 kg)  10/26/22 145 lb (65.8 kg)  09/21/22 146 lb 3.2 oz (66.3 kg)     GENERAL:alert, no distress and comfortable SKIN: skin color normal, no rashes or significant lesions EYES: normal, Conjunctiva are pink and non-injected, sclera clear  NEURO: alert & oriented x 3 with fluent speech  LABORATORY DATA:  I have reviewed the data as listed    Latest Ref Rng & Units 12/07/2022   12:50 PM 11/09/2022    1:15 PM 08/06/2022   11:11 AM  CBC  WBC 4.0 - 10.5 K/uL 3.3  2.6  1.8   Hemoglobin 12.0 - 15.0 g/dL 9.5  9.2  10.2   Hematocrit 36.0 - 46.0 % 27.7  25.6  29.1   Platelets 150 - 400 K/uL 119  135  138         Latest Ref Rng & Units 12/07/2022   12:50 PM 11/09/2022    1:15 PM 08/06/2022   11:11 AM  CMP  Glucose 70 - 99 mg/dL 87  107  113   BUN 8 - 23 mg/dL 53  36  40   Creatinine 0.44 - 1.00 mg/dL 2.97  2.21  1.90   Sodium 135 - 145  mmol/L 135  142  141   Potassium 3.5 - 5.1 mmol/L 4.2  4.4  5.0   Chloride 98 - 111 mmol/L 108  114  108   CO2 22 - 32 mmol/L '17  20  27   '$ Calcium 8.9 - 10.3 mg/dL 9.3  9.1  9.8   Total Protein 6.5 - 8.1 g/dL 7.0  6.5  7.6   Total Bilirubin 0.3 - 1.2 mg/dL 0.3  0.3  0.4   Alkaline Phos 38 - 126 U/L 142  138  128   AST 15 - 41 U/L '23  19  20   '$ ALT 0 - 44 U/L '14  13  13       '$ RADIOGRAPHIC STUDIES: I have personally reviewed the radiological images as listed and agreed with the findings in the report. No results found.    Orders Placed This Encounter  Procedures   NM PET Image Restag (PS) Skull Base To Thigh    Standing Status:   Future    Standing Expiration Date:   12/07/2023    Order Specific Question:   If indicated for the ordered procedure, I authorize the administration of a radiopharmaceutical per Radiology protocol    Answer:   Yes    Order Specific Question:   Preferred imaging location?    Answer:   [facility]   All questions were answered. The patient knows to call the clinic with any problems, questions or concerns. No barriers to learning was detected. The total time spent in the appointment was 30 minutes.     Truitt Merle, MD 12/07/2022   Felicity Coyer, CMA, am acting as scribe for Truitt Merle, MD.   I have reviewed the above documentation for accuracy and completeness, and I agree with the above.

## 2022-12-06 NOTE — Assessment & Plan Note (Addendum)
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 S/p left total mastectomy. She declined antiestrogen therapy due to concerns of side effects. She lost follow up after 07/2017.   -After 6 months of left hip and ribcage pain she was found to have diffuse bone metastasis on 11/28/20 PET. This was confirmed on 12/09/20 bone biopsy --she started first-line Anastrozole in 11/2020 and Ibrance on 01/09/21, not very compliant with Ibrance  -progressed on PET 10/03/2022 -changed her tx to fulvestrant injection and Verzenio , she is tolerating fulvestrant well, but has developed significant gastric discomfort, which has impacted her eating.  She previously tolerated Ibrance well, I will switch her Verzenio to Ibrance 100 mg daily for day 1-14 every 21 days. She agrees, she still has some Ibrance left over from previous, will start next week.  She will stop Verzenio, to see if her gastric discomfort will resolve in the next 3 to 4 days. -Plan to repeat a PET scan in about 2 months.

## 2022-12-07 ENCOUNTER — Inpatient Hospital Stay: Payer: Medicare FFS

## 2022-12-07 ENCOUNTER — Other Ambulatory Visit: Payer: Self-pay

## 2022-12-07 ENCOUNTER — Inpatient Hospital Stay: Payer: Medicare FFS | Attending: Hematology | Admitting: Hematology

## 2022-12-07 ENCOUNTER — Encounter: Payer: Self-pay | Admitting: Hematology

## 2022-12-07 VITALS — BP 147/62 | HR 76 | Temp 97.7°F | Resp 16 | Wt 145.4 lb

## 2022-12-07 DIAGNOSIS — C50212 Malignant neoplasm of upper-inner quadrant of left female breast: Secondary | ICD-10-CM | POA: Insufficient documentation

## 2022-12-07 DIAGNOSIS — Z9012 Acquired absence of left breast and nipple: Secondary | ICD-10-CM | POA: Insufficient documentation

## 2022-12-07 DIAGNOSIS — I129 Hypertensive chronic kidney disease with stage 1 through stage 4 chronic kidney disease, or unspecified chronic kidney disease: Secondary | ICD-10-CM | POA: Diagnosis not present

## 2022-12-07 DIAGNOSIS — N189 Chronic kidney disease, unspecified: Secondary | ICD-10-CM | POA: Diagnosis not present

## 2022-12-07 DIAGNOSIS — Z5111 Encounter for antineoplastic chemotherapy: Secondary | ICD-10-CM | POA: Insufficient documentation

## 2022-12-07 DIAGNOSIS — C7951 Secondary malignant neoplasm of bone: Secondary | ICD-10-CM | POA: Insufficient documentation

## 2022-12-07 DIAGNOSIS — Z170 Estrogen receptor positive status [ER+]: Secondary | ICD-10-CM

## 2022-12-07 DIAGNOSIS — Z79811 Long term (current) use of aromatase inhibitors: Secondary | ICD-10-CM | POA: Insufficient documentation

## 2022-12-07 LAB — COMPREHENSIVE METABOLIC PANEL
ALT: 14 U/L (ref 0–44)
AST: 23 U/L (ref 15–41)
Albumin: 3.5 g/dL (ref 3.5–5.0)
Alkaline Phosphatase: 142 U/L — ABNORMAL HIGH (ref 38–126)
Anion gap: 10 (ref 5–15)
BUN: 53 mg/dL — ABNORMAL HIGH (ref 8–23)
CO2: 17 mmol/L — ABNORMAL LOW (ref 22–32)
Calcium: 9.3 mg/dL (ref 8.9–10.3)
Chloride: 108 mmol/L (ref 98–111)
Creatinine, Ser: 2.97 mg/dL — ABNORMAL HIGH (ref 0.44–1.00)
GFR, Estimated: 15 mL/min — ABNORMAL LOW (ref >60)
Glucose, Bld: 87 mg/dL (ref 70–99)
Potassium: 4.2 mmol/L (ref 3.5–5.1)
Sodium: 135 mmol/L (ref 135–145)
Total Bilirubin: 0.3 mg/dL (ref 0.3–1.2)
Total Protein: 7 g/dL (ref 6.5–8.1)

## 2022-12-07 LAB — CBC WITH DIFFERENTIAL/PLATELET
Abs Immature Granulocytes: 0.01 10*3/uL (ref 0.00–0.07)
Basophils Absolute: 0.1 10*3/uL (ref 0.0–0.1)
Basophils Relative: 2 %
Eosinophils Absolute: 0.1 10*3/uL (ref 0.0–0.5)
Eosinophils Relative: 2 %
HCT: 27.7 % — ABNORMAL LOW (ref 36.0–46.0)
Hemoglobin: 9.5 g/dL — ABNORMAL LOW (ref 12.0–15.0)
Immature Granulocytes: 0 %
Lymphocytes Relative: 35 %
Lymphs Abs: 1.1 10*3/uL (ref 0.7–4.0)
MCH: 41.9 pg — ABNORMAL HIGH (ref 26.0–34.0)
MCHC: 34.3 g/dL (ref 30.0–36.0)
MCV: 122 fL — ABNORMAL HIGH (ref 80.0–100.0)
Monocytes Absolute: 0.2 10*3/uL (ref 0.1–1.0)
Monocytes Relative: 6 %
Neutro Abs: 1.8 10*3/uL (ref 1.7–7.7)
Neutrophils Relative %: 55 %
Platelets: 119 10*3/uL — ABNORMAL LOW (ref 150–400)
RBC: 2.27 MIL/uL — ABNORMAL LOW (ref 3.87–5.11)
RDW: 13.6 % (ref 11.5–15.5)
WBC: 3.3 10*3/uL — ABNORMAL LOW (ref 4.0–10.5)
nRBC: 0 % (ref 0.0–0.2)

## 2022-12-07 MED ORDER — FULVESTRANT 250 MG/5ML IM SOSY
500.0000 mg | PREFILLED_SYRINGE | Freq: Once | INTRAMUSCULAR | Status: AC
  Administered 2022-12-07: 500 mg via INTRAMUSCULAR
  Filled 2022-12-07: qty 10

## 2022-12-07 NOTE — Patient Instructions (Signed)

## 2022-12-08 LAB — CANCER ANTIGEN 27.29: CA 27.29: 84.7 U/mL — ABNORMAL HIGH (ref 0.0–38.6)

## 2022-12-18 ENCOUNTER — Other Ambulatory Visit: Payer: Self-pay

## 2022-12-18 DIAGNOSIS — C50212 Malignant neoplasm of upper-inner quadrant of left female breast: Secondary | ICD-10-CM

## 2022-12-18 MED ORDER — PALBOCICLIB 100 MG PO TABS: 100.0000 mg | ORAL_TABLET | Freq: Every day | ORAL | 1 refills | Status: DC

## 2022-12-21 ENCOUNTER — Other Ambulatory Visit: Payer: Self-pay

## 2022-12-21 MED ORDER — PALBOCICLIB 100 MG PO TABS: 100.0000 mg | ORAL_TABLET | Freq: Every day | ORAL | 1 refills | Status: DC

## 2022-12-27 ENCOUNTER — Other Ambulatory Visit (HOSPITAL_COMMUNITY): Payer: Self-pay

## 2023-01-04 ENCOUNTER — Other Ambulatory Visit: Payer: Self-pay

## 2023-01-04 ENCOUNTER — Inpatient Hospital Stay: Payer: Medicare FFS

## 2023-01-04 ENCOUNTER — Inpatient Hospital Stay: Payer: Medicare FFS | Attending: Hematology

## 2023-01-04 VITALS — BP 164/54 | HR 78 | Temp 97.9°F | Resp 18

## 2023-01-04 DIAGNOSIS — C50212 Malignant neoplasm of upper-inner quadrant of left female breast: Secondary | ICD-10-CM

## 2023-01-04 DIAGNOSIS — Z170 Estrogen receptor positive status [ER+]: Secondary | ICD-10-CM | POA: Diagnosis not present

## 2023-01-04 DIAGNOSIS — Z5111 Encounter for antineoplastic chemotherapy: Secondary | ICD-10-CM | POA: Diagnosis present

## 2023-01-04 DIAGNOSIS — C7951 Secondary malignant neoplasm of bone: Secondary | ICD-10-CM | POA: Insufficient documentation

## 2023-01-04 LAB — COMPREHENSIVE METABOLIC PANEL
ALT: 16 U/L (ref 0–44)
AST: 24 U/L (ref 15–41)
Albumin: 3.6 g/dL (ref 3.5–5.0)
Alkaline Phosphatase: 140 U/L — ABNORMAL HIGH (ref 38–126)
Anion gap: 9 (ref 5–15)
BUN: 32 mg/dL — ABNORMAL HIGH (ref 8–23)
CO2: 24 mmol/L (ref 22–32)
Calcium: 9.3 mg/dL (ref 8.9–10.3)
Chloride: 109 mmol/L (ref 98–111)
Creatinine, Ser: 1.77 mg/dL — ABNORMAL HIGH (ref 0.44–1.00)
GFR, Estimated: 28 mL/min — ABNORMAL LOW (ref >60)
Glucose, Bld: 88 mg/dL (ref 70–99)
Potassium: 4.3 mmol/L (ref 3.5–5.1)
Sodium: 142 mmol/L (ref 135–145)
Total Bilirubin: 0.4 mg/dL (ref 0.3–1.2)
Total Protein: 7 g/dL (ref 6.5–8.1)

## 2023-01-04 LAB — CBC WITH DIFFERENTIAL/PLATELET
Abs Immature Granulocytes: 0 10*3/uL (ref 0.00–0.07)
Basophils Absolute: 0 10*3/uL (ref 0.0–0.1)
Basophils Relative: 2 %
Eosinophils Absolute: 0 10*3/uL (ref 0.0–0.5)
Eosinophils Relative: 1 %
HCT: 25.2 % — ABNORMAL LOW (ref 36.0–46.0)
Hemoglobin: 8.8 g/dL — ABNORMAL LOW (ref 12.0–15.0)
Immature Granulocytes: 0 %
Lymphocytes Relative: 41 %
Lymphs Abs: 1.1 10*3/uL (ref 0.7–4.0)
MCH: 42.3 pg — ABNORMAL HIGH (ref 26.0–34.0)
MCHC: 34.9 g/dL (ref 30.0–36.0)
MCV: 121.2 fL — ABNORMAL HIGH (ref 80.0–100.0)
Monocytes Absolute: 0.3 10*3/uL (ref 0.1–1.0)
Monocytes Relative: 12 %
Neutro Abs: 1.2 10*3/uL — ABNORMAL LOW (ref 1.7–7.7)
Neutrophils Relative %: 44 %
Platelets: 103 10*3/uL — ABNORMAL LOW (ref 150–400)
RBC: 2.08 MIL/uL — ABNORMAL LOW (ref 3.87–5.11)
RDW: 14.4 % (ref 11.5–15.5)
Smear Review: NORMAL
WBC: 2.7 10*3/uL — ABNORMAL LOW (ref 4.0–10.5)
nRBC: 0 % (ref 0.0–0.2)

## 2023-01-04 MED ORDER — FULVESTRANT 250 MG/5ML IM SOSY
500.0000 mg | PREFILLED_SYRINGE | Freq: Once | INTRAMUSCULAR | Status: AC
  Administered 2023-01-04: 500 mg via INTRAMUSCULAR
  Filled 2023-01-04: qty 10

## 2023-01-04 NOTE — Patient Instructions (Signed)

## 2023-01-05 LAB — CANCER ANTIGEN 27.29: CA 27.29: 66.9 U/mL — ABNORMAL HIGH (ref 0.0–38.6)

## 2023-01-16 ENCOUNTER — Ambulatory Visit (INDEPENDENT_AMBULATORY_CARE_PROVIDER_SITE_OTHER): Payer: Medicare FFS

## 2023-01-16 VITALS — Ht 62.0 in | Wt 145.0 lb

## 2023-01-16 DIAGNOSIS — Z0000 Encounter for general adult medical examination without abnormal findings: Secondary | ICD-10-CM | POA: Diagnosis not present

## 2023-01-16 NOTE — Patient Instructions (Signed)
Shirley Wells , Thank you for taking time to come for your Medicare Wellness Visit. I appreciate your ongoing commitment to your health goals. Please review the following plan we discussed and let me know if I can assist you in the future.   These are the goals we discussed:  Goals      Patient Stated     MY GOAL IS TO CONTINUE TO [place].        This is a list of the screening recommended for you and due dates:  Health Maintenance  Topic Date Due   DTaP/Tdap/Td vaccine (1 - Tdap) Never done   COVID-19 Vaccine (3 - Pfizer risk series) 11/25/2020   DEXA scan (bone density measurement)  09/22/2023*   Medicare Annual Wellness Visit  01/17/2024   HPV Vaccine  Aged Out   Pneumonia Vaccine  Discontinued   Zoster (Shingles) Vaccine  Discontinued  *Topic was postponed. The date shown is not the original due date.    Advanced directives: No  Conditions/risks identified: Yes  Next appointment: Follow up in one year for your annual wellness visit.   Preventive Care 52 Years and Older, Female Preventive care refers to lifestyle choices and visits with your health care provider that can promote health and wellness. What does preventive care include? A yearly physical exam. This is also called an annual well check. Dental exams once or twice a year. Routine eye exams. Ask your health care provider how often you should have your eyes checked. Personal lifestyle choices, including: Daily care of your teeth and gums. Regular physical activity. Eating a healthy diet. Avoiding tobacco and drug use. Limiting alcohol use. Practicing safe sex. Taking low-dose aspirin every day. Taking vitamin and mineral supplements as recommended by your health care provider. What happens during an annual well check? The services and screenings done by your health care provider during your annual well check will depend on your age, overall health, lifestyle risk factors, and family history  of disease. Counseling  Your health care provider may ask you questions about your: Alcohol use. Tobacco use. Drug use. Emotional well-being. Home and relationship well-being. Sexual activity. Eating habits. History of falls. Memory and ability to understand (cognition). Work and work Statistician. Reproductive health. Screening  You may have the following tests or measurements: Height, weight, and BMI. Blood pressure. Lipid and cholesterol levels. These may be checked every 5 years, or more frequently if you are over 65 years old. Skin check. Lung cancer screening. You may have this screening every year starting at age 52 if you have a 30-pack-year history of smoking and currently smoke or have quit within the past 15 years. Fecal occult blood test (FOBT) of the stool. You may have this test every year starting at age 41. Flexible sigmoidoscopy or colonoscopy. You may have a sigmoidoscopy every 5 years or a colonoscopy every 10 years starting at age 49. Hepatitis C blood test. Hepatitis B blood test. Sexually transmitted disease (STD) testing. Diabetes screening. This is done by checking your blood sugar (glucose) after you have not eaten for a while (fasting). You may have this done every 1-3 years. Bone density scan. This is done to screen for osteoporosis. You may have this done starting at age 70. Mammogram. This may be done every 1-2 years. Talk to your health care provider about how often you should have regular mammograms. Talk with your health care provider about your test results, treatment options, and if necessary, the need  for more tests. Vaccines  Your health care provider may recommend certain vaccines, such as: Influenza vaccine. This is recommended every year. Tetanus, diphtheria, and acellular pertussis (Tdap, Td) vaccine. You may need a Td booster every 10 years. Zoster vaccine. You may need this after age 85. Pneumococcal 13-valent conjugate (PCV13) vaccine. One  dose is recommended after age 66. Pneumococcal polysaccharide (PPSV23) vaccine. One dose is recommended after age 18. Talk to your health care provider about which screenings and vaccines you need and how often you need them. This information is not intended to replace advice given to you by your health care provider. Make sure you discuss any questions you have with your health care provider. Document Released: 12/09/2015 Document Revised: 08/01/2016 Document Reviewed: 09/13/2015 Elsevier Interactive Patient Education  2017 [place] Prevention in the Home Falls can cause injuries. They can happen to people of all ages. There are many things you can do to make your home safe and to help prevent falls. What can I do on the outside of my home? Regularly fix the edges of walkways and driveways and fix any cracks. Remove anything that might make you trip as you walk through a door, such as a raised step or threshold. Trim any bushes or trees on the path to your home. Use bright outdoor lighting. Clear any walking paths of anything that might make someone trip, such as rocks or tools. Regularly check to see if handrails are loose or broken. Make sure that both sides of any steps have handrails. Any raised decks and porches should have guardrails on the edges. Have any leaves, snow, or ice cleared regularly. Use sand or salt on walking paths during winter. Clean up any spills in your garage right away. This includes oil or grease spills. What can I do in the bathroom? Use night lights. Install grab bars by the toilet and in the tub and shower. Do not use towel bars as grab bars. Use non-skid mats or decals in the tub or shower. If you need to sit down in the shower, use a plastic, non-slip stool. Keep the floor dry. Clean up any water that spills on the floor as soon as it happens. Remove soap buildup in the tub or shower regularly. Attach bath mats securely with double-sided  non-slip rug tape. Do not have throw rugs and other things on the floor that can make you trip. What can I do in the bedroom? Use night lights. Make sure that you have a light by your bed that is easy to reach. Do not use any sheets or blankets that are too big for your bed. They should not hang down onto the floor. Have a firm chair that has side arms. You can use this for support while you get dressed. Do not have throw rugs and other things on the floor that can make you trip. What can I do in the kitchen? Clean up any spills right away. Avoid walking on wet floors. Keep items that you use a lot in easy-to-reach places. If you need to reach something above you, use a strong step stool that has a grab bar. Keep electrical cords out of the way. Do not use floor polish or wax that makes floors slippery. If you must use wax, use non-skid floor wax. Do not have throw rugs and other things on the floor that can make you trip. What can I do with my stairs? Do not leave any items on the stairs.  Make sure that there are handrails on both sides of the stairs and use them. Fix handrails that are broken or loose. Make sure that handrails are as long as the stairways. Check any carpeting to make sure that it is firmly attached to the stairs. Fix any carpet that is loose or worn. Avoid having throw rugs at the top or bottom of the stairs. If you do have throw rugs, attach them to the floor with carpet tape. Make sure that you have a light switch at the top of the stairs and the bottom of the stairs. If you do not have them, ask someone to add them for you. What else can I do to help prevent falls? Wear shoes that: Do not have high heels. Have rubber bottoms. Are comfortable and fit you well. Are closed at the toe. Do not wear sandals. If you use a stepladder: Make sure that it is fully opened. Do not climb a closed stepladder. Make sure that both sides of the stepladder are locked into place. Ask  someone to hold it for you, if possible. Clearly mark and make sure that you can see: Any grab bars or handrails. First and last steps. Where the edge of each step is. Use tools that help you move around (mobility aids) if they are needed. These include: Canes. Walkers. Scooters. Crutches. Turn on the lights when you go into a dark area. Replace any light bulbs as soon as they burn out. Set up your furniture so you have a clear path. Avoid moving your furniture around. If any of your floors are uneven, fix them. If there are any pets around you, be aware of where they are. Review your medicines with your doctor. Some medicines can make you feel dizzy. This can increase your chance of falling. Ask your doctor what other things that you can do to help prevent falls. This information is not intended to replace advice given to you by your health care provider. Make sure you discuss any questions you have with your health care provider. Document Released: 09/08/2009 Document Revised: 04/19/2016 Document Reviewed: 12/17/2014 Elsevier Interactive Patient Education  2017 [employer].

## 2023-01-16 NOTE — Progress Notes (Signed)
I connected with  Shirley Wells on 01/16/2023 at 10:30 a.m. EST by telephone and verified that I am speaking with the correct person using two identifiers.  Location: Patient: Home Provider: [place] Persons participating in the virtual visit: [place]   I discussed the limitations, risks, security and privacy concerns of performing an evaluation and management service by telephone and the availability of in person appointments. The patient expressed understanding and agreed to proceed.  Interactive audio and video telecommunications were attempted between this nurse and patient, however failed, due to patient having technical difficulties OR patient did not have access to video capability.  We continued and completed visit with audio only.  Some vital signs may be absent or patient reported.   Sheral Flow, LPN Subjective:   Shirley Wells is a 82 y.o. female who presents for Medicare Annual (Subsequent) preventive examination.  Review of Systems     Cardiac Risk Factors include: advanced age (>8mn, >>16women);dyslipidemia;hypertension     Objective:    Today's Vitals   01/16/23 1035  Weight: 145 lb (65.8 kg)  Height: 5' 2"$  (1.575 m)  PainSc: 7   PainLoc: Eye   Body mass index is 26.52 kg/m.     01/16/2023   10:39 AM 01/15/2022   10:45 AM 01/15/2021   11:28 AM 12/09/2020    9:48 AM 12/01/2020    1:57 PM 07/31/2017    1:00 AM 07/22/2017   12:27 PM  Advanced Directives  Does Patient Have a Medical Advance Directive? No No No No No No No  Would patient like information on creating a medical advance directive? No - Patient declined No - Patient declined Yes (ED - Information included in AVS) No - Patient declined No - Patient declined No - Patient declined No - Patient declined    Current Medications (verified) Outpatient Encounter Medications as of 01/16/2023  Medication Sig   acetaminophen (TYLENOL) 650 MG CR tablet Take 1,300 mg by mouth every 8  (eight) hours as needed for pain.   aspirin 81 MG tablet Take 81 mg by mouth 2 (two) times a week.   clopidogrel (PLAVIX) 75 MG tablet TAKE 1 TABLET(75 MG) BY MOUTH DAILY   furosemide (LASIX) 20 MG tablet Take 20 mg by mouth daily as needed for fluid.   lisinopril (ZESTRIL) 10 MG tablet TAKE 1 TABLET(10 MG) BY MOUTH DAILY   loratadine (CLARITIN) 10 MG tablet Take 10 mg by mouth daily as needed for allergies.   Menthol, Topical Analgesic, (BIOFREEZE EX) Apply 1 application topically daily as needed (muscle pain).   milk thistle 175 MG tablet Take 175 mg by mouth daily.   Multiple Vitamin (MULTIVITAMIN) capsule Take 1 capsule by mouth daily.   palbociclib (IBRANCE) 100 MG tablet Take 1 tablet (100 mg total) by mouth daily. Take days 1-14 and 7 days off for a  21 day cycle   No facility-administered encounter medications on file as of 01/16/2023.    Allergies (verified) Influenza vaccines, Pneumococcal vaccines, Simvastatin, Tetanus toxoids, and Zoster vaccine live   History: Past Medical History:  Diagnosis Date   CHICKENPOX, HX OF 03/21/2010   Qualifier: Diagnosis of  By: BMarca AnconaRMA, Lucy     CVA (cerebral infarction) 02/2010   no residual deficits, a/w mid basal art stenosis, declined IC stent as rec by IR/neuro   Dyslipidemia    intol of statin due to >3x increase LFTs   GERD (gastroesophageal reflux disease)    hx  Hypertension, essential    Malignant neoplasm of upper-inner quadrant of left breast in female, estrogen receptor positive ([place]) 06/20/2017   Osteoarthritis    Stroke [facility])    Past Surgical History:  Procedure Laterality Date   CHOLECYSTECTOMY  1981   MASTECTOMY W/ SENTINEL NODE BIOPSY Left 07/30/2017   Procedure: LEFT TOTAL MASTECTOMY WITH AXILLARY SENTINEL LYMPH NODE BIOPSY;  Surgeon: Rolm Bookbinder, MD;  Location: [facility];  Service: General;  Laterality: Left;   Family History  Problem Relation Age of Onset   Breast cancer Mother 51   Arthritis Other    Breast  cancer Maternal Aunt    Social History   Socioeconomic History   Marital status: Divorced    Spouse name: Not on file   Number of children: Not on file   Years of education: Not on file   Highest education level: Not on file  Occupational History   Not on file  Tobacco Use   Smoking status: Never   Smokeless tobacco: Never  Vaping Use   Vaping Use: Never used  Substance and Sexual Activity   Alcohol use: No    Alcohol/week: 0.0 standard drinks of alcohol   Drug use: No   Sexual activity: Not on file  Other Topics Concern   Not on file  Social History Narrative   Divorced  >40 yrs, single and lives alone.    Supportive daughters, 1 is a Therapist, sports at [employer] [place]).    Patient enjoys gardening, dancing and caring for her grand children.   Social Determinants of Health   Financial Resource Strain: Low Risk  (01/16/2023)   Overall Financial Resource Strain (CARDIA)    Difficulty of Paying Living Expenses: Not hard at all  Food Insecurity: No Food Insecurity (01/16/2023)   Hunger Vital Sign    Worried About Running Out of Food in the Last Year: Never true    Ran Out of Food in the Last Year: Never true  Transportation Needs: No Transportation Needs (01/16/2023)   PRAPARE - Hydrologist (Medical): No    Lack of Transportation (Non-Medical): No  Physical Activity: Sufficiently Active (01/16/2023)   Exercise Vital Sign    Days of Exercise per Week: 5 days    Minutes of Exercise per Session: 30 min  Stress: No Stress Concern Present (01/16/2023)   [place]    Feeling of Stress : Not at all  Social Connections: Moderately Integrated (01/16/2023)   Social Connection and Isolation Panel [NHANES]    Frequency of Communication with Friends and Family: More than three times a week    Frequency of Social Gatherings with Friends and Family: Not on file    Attends Religious Services: 1 to  4 times per year    Active Member of [employer] or Organizations: Yes    Attends Archivist Meetings: 1 to 4 times per year    Marital Status: Divorced    Tobacco Counseling Counseling given: Not Answered   Clinical Intake:  Pre-visit preparation completed: Yes  Pain : No/denies pain Pain Score: 7      BMI - recorded: 26.52 Nutritional Status: BMI 25 -29 Overweight Nutritional Risks: None Diabetes: No  How often do you need to have someone help you when you read instructions, pamphlets, or other written materials from your doctor or pharmacy?: 1 - Never What is the last grade level you completed in school?: HSG  Diabetic? No  Interpreter Needed?: No  Information entered by :: Lisette Abu, LPN.   Activities of Daily Living    01/16/2023   10:42 AM  In your present state of health, do you have any difficulty performing the following activities:  Hearing? 0  Vision? 0  Difficulty concentrating or making decisions? 0  Walking or climbing stairs? 0  Dressing or bathing? 0  Doing errands, shopping? 0  Preparing Food and eating ? N  Using the Toilet? N  In the past six months, have you accidently leaked urine? N  Do you have problems with loss of bowel control? N  Managing your Medications? N  Managing your Finances? N  Housekeeping or managing your Housekeeping? N    Patient Care Team: Binnie Rail, MD as PCP - General (Internal Medicine) Garvin Fila, MD as Consulting Physician (Neurology) Rolm Bookbinder, MD as Consulting Physician (General Surgery) Truitt Merle, MD as Consulting Physician (Hematology) Kyung Rudd, MD as Consulting Physician (Radiation Oncology)  Indicate any recent Medical Services you may have received from other than [facility] providers in the past year (date may be approximate).     Assessment:   This is a routine wellness examination for Shirley Wells.  Hearing/Vision screen Hearing Screening - Comments:: Denies hearing  difficulties   Vision Screening - Comments:: Wears rx glasses - up to date with routine eye exams with [facility]   Dietary issues and exercise activities discussed: Current Exercise Habits: Home exercise routine, Type of exercise: walking, Time (Minutes): 30, Frequency (Times/Week): 5, Weekly Exercise (Minutes/Week): 150, Intensity: Moderate, Exercise limited by: orthopedic condition(s)   Goals Addressed   None   Depression Screen    01/16/2023   10:38 AM 09/21/2022    2:13 PM 01/15/2022   11:01 AM 03/14/2020   11:08 AM 02/03/2019   11:14 AM 02/10/2018    1:53 PM 11/02/2016   10:40 AM  PHQ 2/9 Scores  PHQ - 2 Score 0 0 0 0 0 0 0    Fall Risk    01/16/2023   10:41 AM 09/21/2022    2:13 PM 01/15/2022   10:46 AM 03/14/2020   11:08 AM 02/03/2019   11:14 AM  [place] in the past year? 0 0 0 0 0  Number falls in past yr: 0 0 0 0   Injury with Fall? 0 0 0 0   Risk for fall due to : No Fall Risks No Fall Risks No Fall Risks    Follow up Falls prevention discussed Falls evaluation completed Falls evaluation completed Falls evaluation completed     [place]:  Any stairs in or around the home? Yes  If so, are there any without handrails? No  Home free of loose throw rugs in walkways, pet beds, electrical cords, etc? Yes  Adequate lighting in your home to reduce risk of falls? Yes   ASSISTIVE DEVICES UTILIZED TO PREVENT FALLS:  Life alert? No  Use of a cane, walker or w/c? No  Grab bars in the bathroom? Yes  Shower chair or bench in shower? Yes  Elevated toilet seat or a handicapped toilet? Yes   TIMED UP AND GO:  Was the test performed? No . Telephonic Visit  Cognitive Function:        01/16/2023   10:40 AM  6CIT Screen  What Year? 0 points  What month? 0 points  What time? 0 points  Count back from 20 0 points  Months in  reverse 0 points  Repeat phrase 0 points  Total Score 0 points    Immunizations Immunization  History  Administered Date(s) Administered   PFIZER(Purple Top)SARS-COV-2 Vaccination 09/05/2020, 10/28/2020    TDAP Status: Declined  Flu Vaccine status: Declined, Education has been provided regarding the importance of this vaccine but patient still declined. Advised may receive this vaccine at local pharmacy or Health Dept. Aware to provide a copy of the vaccination record if obtained from local pharmacy or Health Dept. Verbalized acceptance and understanding.  Pneumococcal vaccine status: Declined,  Education has been provided regarding the importance of this vaccine but patient still declined. Advised may receive this vaccine at local pharmacy or Health Dept. Aware to provide a copy of the vaccination record if obtained from local pharmacy or Health Dept. Verbalized acceptance and understanding.   Covid-19 vaccine status: Completed vaccines  Qualifies for Shingles Vaccine? Yes   Zostavax completed No   Shingrix Completed?: No.    Education has been provided regarding the importance of this vaccine. Patient has been advised to call insurance company to determine out of pocket expense if they have not yet received this vaccine. Advised may also receive vaccine at local pharmacy or Health Dept. Verbalized acceptance and understanding.  Screening Tests Health Maintenance  Topic Date Due   DTaP/Tdap/Td (1 - Tdap) Never done   COVID-19 Vaccine (3 - Pfizer risk series) 11/25/2020   DEXA SCAN  09/22/2023 (Originally 09/12/2022)   Medicare Annual Wellness (AWV)  01/17/2024   HPV VACCINES  Aged Out   Pneumonia Vaccine 45+ Years old  Discontinued   Zoster Vaccines- Shingrix  Discontinued    Health Maintenance  Health Maintenance Due  Topic Date Due   DTaP/Tdap/Td (1 - Tdap) Never done   COVID-19 Vaccine (3 - Pfizer risk series) 11/25/2020    Colorectal cancer screening: No longer required.   Mammogram status: No longer required due to history of breast cancer.  Bone Density status:  Completed 09/12/2020. Results reflect: Bone density results: OSTEOPENIA. Repeat every 2-3 years.  Lung Cancer Screening: (Low Dose CT Chest recommended if Age 81-80 years, 30 pack-year currently smoking OR have quit w/in 15years.) does not qualify.   Lung Cancer Screening Referral: no  Additional Screening:  Hepatitis C Screening: does not qualify; Completed no  Vision Screening: Recommended annual ophthalmology exams for early detection of glaucoma and other disorders of the eye. Is the patient up to date with their annual eye exam?  Yes  Who is the provider or what is the name of the office in which the patient attends annual eye exams? [facility] If pt is not established with a provider, would they like to be referred to a provider to establish care? No .   Dental Screening: Recommended annual dental exams for proper oral hygiene  Community Resource Referral / Chronic Care Management: CRR required this visit?  No   CCM required this visit?  No      Plan:     I have personally reviewed and noted the following in the patient's chart:   Medical and social history Use of alcohol, tobacco or illicit drugs  Current medications and supplements including opioid prescriptions. Patient is not currently taking opioid prescriptions. Functional ability and status Nutritional status Physical activity Advanced directives List of other physicians Hospitalizations, surgeries, and ER visits in previous 12 months Vitals Screenings to include cognitive, depression, and falls Referrals and appointments  In addition, I have reviewed and discussed with patient certain preventive protocols, quality  metrics, and best practice recommendations. A written personalized care plan for preventive services as well as general preventive health recommendations were provided to patient.     Sheral Flow, LPN   [id number]   Nurse Notes: Normal cognitive status assessed by direct observation by  this Nurse Health Advisor. No abnormalities found.

## 2023-01-28 ENCOUNTER — Ambulatory Visit (HOSPITAL_COMMUNITY)
Admission: RE | Admit: 2023-01-28 | Discharge: 2023-01-28 | Disposition: A | Discharge status: Home / Self Care | Payer: Medicare FFS | Source: Ambulatory Visit | Attending: Hematology | Admitting: Hematology

## 2023-01-28 DIAGNOSIS — C7951 Secondary malignant neoplasm of bone: Secondary | ICD-10-CM | POA: Insufficient documentation

## 2023-01-28 DIAGNOSIS — C50212 Malignant neoplasm of upper-inner quadrant of left female breast: Secondary | ICD-10-CM | POA: Insufficient documentation

## 2023-01-28 DIAGNOSIS — Z170 Estrogen receptor positive status [ER+]: Secondary | ICD-10-CM | POA: Diagnosis present

## 2023-01-28 LAB — GLUCOSE, CAPILLARY: Glucose-Capillary: 104 mg/dL — ABNORMAL HIGH (ref 70–99)

## 2023-01-28 MED ORDER — FLUDEOXYGLUCOSE F - 18 (FDG) INJECTION
7.0000 | Freq: Once | INTRAVENOUS | Status: AC
  Administered 2023-01-28: 7.08 via INTRAVENOUS

## 2023-01-29 ENCOUNTER — Telehealth: Payer: Self-pay | Admitting: Internal Medicine

## 2023-01-29 NOTE — Telephone Encounter (Signed)
Patient called to reschedule appointment

## 2023-02-01 ENCOUNTER — Inpatient Hospital Stay: Payer: Medicare FFS

## 2023-02-04 ENCOUNTER — Other Ambulatory Visit: Payer: Self-pay

## 2023-02-04 ENCOUNTER — Inpatient Hospital Stay: Payer: Medicare FFS

## 2023-02-04 ENCOUNTER — Inpatient Hospital Stay: Payer: Medicare FFS | Attending: Hematology

## 2023-02-04 VITALS — BP 195/61 | HR 54 | Temp 97.1°F | Resp 16

## 2023-02-04 DIAGNOSIS — I129 Hypertensive chronic kidney disease with stage 1 through stage 4 chronic kidney disease, or unspecified chronic kidney disease: Secondary | ICD-10-CM | POA: Insufficient documentation

## 2023-02-04 DIAGNOSIS — Z79811 Long term (current) use of aromatase inhibitors: Secondary | ICD-10-CM | POA: Diagnosis not present

## 2023-02-04 DIAGNOSIS — C50212 Malignant neoplasm of upper-inner quadrant of left female breast: Secondary | ICD-10-CM | POA: Diagnosis present

## 2023-02-04 DIAGNOSIS — Z5111 Encounter for antineoplastic chemotherapy: Secondary | ICD-10-CM | POA: Diagnosis present

## 2023-02-04 DIAGNOSIS — Z9012 Acquired absence of left breast and nipple: Secondary | ICD-10-CM | POA: Insufficient documentation

## 2023-02-04 DIAGNOSIS — Z170 Estrogen receptor positive status [ER+]: Secondary | ICD-10-CM | POA: Diagnosis not present

## 2023-02-04 DIAGNOSIS — C7951 Secondary malignant neoplasm of bone: Secondary | ICD-10-CM | POA: Insufficient documentation

## 2023-02-04 DIAGNOSIS — N189 Chronic kidney disease, unspecified: Secondary | ICD-10-CM | POA: Diagnosis not present

## 2023-02-04 LAB — COMPREHENSIVE METABOLIC PANEL
ALT: 17 U/L (ref 0–44)
AST: 24 U/L (ref 15–41)
Albumin: 3.8 g/dL (ref 3.5–5.0)
Alkaline Phosphatase: 128 U/L — ABNORMAL HIGH (ref 38–126)
Anion gap: 8 (ref 5–15)
BUN: 26 mg/dL — ABNORMAL HIGH (ref 8–23)
CO2: 24 mmol/L (ref 22–32)
Calcium: 9.4 mg/dL (ref 8.9–10.3)
Chloride: 108 mmol/L (ref 98–111)
Creatinine, Ser: 1.73 mg/dL — ABNORMAL HIGH (ref 0.44–1.00)
GFR, Estimated: 29 mL/min — ABNORMAL LOW (ref >60)
Glucose, Bld: 91 mg/dL (ref 70–99)
Potassium: 4.7 mmol/L (ref 3.5–5.1)
Sodium: 140 mmol/L (ref 135–145)
Total Bilirubin: 0.4 mg/dL (ref 0.3–1.2)
Total Protein: 6.9 g/dL (ref 6.5–8.1)

## 2023-02-04 LAB — CBC WITH DIFFERENTIAL/PLATELET
Abs Immature Granulocytes: 0.02 10*3/uL (ref 0.00–0.07)
Basophils Absolute: 0.1 10*3/uL (ref 0.0–0.1)
Basophils Relative: 3 %
Eosinophils Absolute: 0.1 10*3/uL (ref 0.0–0.5)
Eosinophils Relative: 3 %
HCT: 27.4 % — ABNORMAL LOW (ref 36.0–46.0)
Hemoglobin: 9.3 g/dL — ABNORMAL LOW (ref 12.0–15.0)
Immature Granulocytes: 1 %
Lymphocytes Relative: 40 %
Lymphs Abs: 1 10*3/uL (ref 0.7–4.0)
MCH: 42.1 pg — ABNORMAL HIGH (ref 26.0–34.0)
MCHC: 33.9 g/dL (ref 30.0–36.0)
MCV: 124 fL — ABNORMAL HIGH (ref 80.0–100.0)
Monocytes Absolute: 0.2 10*3/uL (ref 0.1–1.0)
Monocytes Relative: 9 %
Neutro Abs: 1.2 10*3/uL — ABNORMAL LOW (ref 1.7–7.7)
Neutrophils Relative %: 44 %
Platelets: 161 10*3/uL (ref 150–400)
RBC: 2.21 MIL/uL — ABNORMAL LOW (ref 3.87–5.11)
RDW: 14.5 % (ref 11.5–15.5)
WBC: 2.6 10*3/uL — ABNORMAL LOW (ref 4.0–10.5)
nRBC: 0 % (ref 0.0–0.2)

## 2023-02-04 MED ORDER — FULVESTRANT 250 MG/5ML IM SOSY
500.0000 mg | PREFILLED_SYRINGE | Freq: Once | INTRAMUSCULAR | Status: AC
  Administered 2023-02-04: 500 mg via INTRAMUSCULAR
  Filled 2023-02-04: qty 10

## 2023-02-05 LAB — CANCER ANTIGEN 27.29: CA 27.29: 74.8 U/mL — ABNORMAL HIGH (ref 0.0–38.6)

## 2023-02-05 NOTE — Assessment & Plan Note (Signed)
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 S/p left total mastectomy. She declined antiestrogen therapy due to concerns of side effects. She lost follow up after 07/2017.   -After 6 months of left hip and ribcage pain she was found to have diffuse bone metastasis on 11/28/20 PET. This was confirmed on 12/09/20 bone biopsy --she started first-line Anastrozole in 11/2020 and Ibrance on 01/09/21, not very compliant with Ibrance  -progressed on PET 10/03/2022 -changed her tx to fulvestrant injection and Verzenio. Due to GI side effect, I changed her Verzenio to [place] -Restaging PET scan from January 28, 2023 showed overall stable disease, with some lesion mildly improved -Will continue fulvestrant and Ibrance

## 2023-02-05 NOTE — Assessment & Plan Note (Signed)
-  with L1/L3 fracture, hypercalcemia -S/p palliative RT with Dr Moody 1/12-1/26/22 for severe left hip pain. -She received one dose of Zometa on 11/29/20 for hypercalcinemia, but given her CKD this has been held. Her calcium has been WNL to low since Zometa injection. 

## 2023-02-06 ENCOUNTER — Encounter: Payer: Self-pay | Admitting: Hematology

## 2023-02-06 ENCOUNTER — Inpatient Hospital Stay (HOSPITAL_BASED_OUTPATIENT_CLINIC_OR_DEPARTMENT_OTHER): Payer: Medicare FFS | Admitting: Hematology

## 2023-02-06 DIAGNOSIS — C7951 Secondary malignant neoplasm of bone: Secondary | ICD-10-CM

## 2023-02-06 DIAGNOSIS — Z170 Estrogen receptor positive status [ER+]: Secondary | ICD-10-CM

## 2023-02-06 DIAGNOSIS — C50212 Malignant neoplasm of upper-inner quadrant of left female breast: Secondary | ICD-10-CM

## 2023-02-06 MED ORDER — PALBOCICLIB 100 MG PO TABS: 100.0000 mg | ORAL_TABLET | Freq: Every day | ORAL | 1 refills | Status: DC

## 2023-02-06 NOTE — Progress Notes (Signed)
[place]   [phone] [phone]) [phone]   Clinic Follow up Note   Patient Care Team: Binnie Rail, MD as PCP - General (Internal Medicine) Garvin Fila, MD as Consulting Physician (Neurology) Rolm Bookbinder, MD as Consulting Physician (General Surgery) Truitt Merle, MD as Consulting Physician (Hematology) Kyung Rudd, MD as Consulting Physician (Radiation Oncology)  Date of Service:  02/06/2023  I connected with Shirley Wells on 02/06/2023 at 10:40 AM EDT by telephone visit and verified that I am speaking with the correct person using two identifiers.  I discussed the limitations, risks, security and privacy concerns of performing an evaluation and management service by telephone and the availability of in person appointments. I also discussed with the patient that there may be a patient responsible charge related to this service. The patient expressed understanding and agreed to proceed.   Other persons participating in the visit and their role in the encounter:  No  Patient's location:  Home Provider's location:  Office  CHIEF COMPLAINT: f/u of metastatic breast cancer    CURRENT THERAPY:  -Fulvestrant injection started on October 12, 2022 -Verzenio 100 mg twice daily started on October 25, 2022   ASSESSMENT & PLAN:  Shirley Wells is a 82 y.o. 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 S/p left total mastectomy. She declined antiestrogen therapy due to concerns of side effects. She lost follow up after 07/2017.   -After 6 months of left hip and ribcage pain she was found to have diffuse bone metastasis on 11/28/20 PET. This was confirmed on 12/09/20 bone biopsy --she started first-line Anastrozole in 11/2020 and Ibrance on  01/09/21, not very compliant with Ibrance  -progressed on PET 10/03/2022 -changed her tx to fulvestrant injection and Verzenio. Due to GI side effect, I changed her Verzenio to [place] -Restaging PET scan from January 28, 2023 showed overall stable disease, with some lesion mildly improved -Will continue fulvestrant and Ibrance  Metastasis to bone [facility]) -with L1/L3 fracture, hypercalcemia -S/p palliative RT with Dr Lisbeth Renshaw 1/12-1/26/22 for severe left hip pain. -She received one dose of Zometa on 11/29/20 for hypercalcinemia, but given her CKD this has been held. Her calcium has been WNL to low since Zometa injection.   PLAN: Lab reviewed -discuss PET scan findings  -Continue fulvestrant injection every 4 weeks, continue Ibrance -f/u in 8 weeks   SUMMARY OF ONCOLOGIC HISTORY: Oncology History Overview Note   Cancer Staging  Malignant neoplasm of upper-inner quadrant of left breast in female, estrogen receptor positive ([place]) Staging form: Breast, AJCC 8th Edition - Clinical stage from 06/18/2017: Stage IB (cT2, cN0, cM0, G2, ER+, PR+, HER2-) - Signed by Truitt Merle, MD on 06/24/2017 Histologic grading system: 3 grade system - Pathologic stage from 07/30/2017: Stage IA (pT2, pN0, cM0, G2, ER+, PR+, HER2-, Oncotype DX score: 23) - Signed by Truitt Merle, MD on 08/23/2017 Neoadjuvant therapy: No Nuclear grade: G2 Multigene prognostic tests performed: Oncotype DX Recurrence score range: Greater than or equal to 11 Histologic grading system: 3 grade system Residual tumor (R): R0 - None Laterality: Left     Malignant neoplasm of upper-inner quadrant of left breast in female, estrogen receptor positive ([place])  06/17/2017 Mammogram   [place] and MM diagnostic Breast Tomo Bilateral  IMPRESSION: 1. 3.4  palpable mass in the 10 o'clock position of the left breast is highly suspicious for malignancy.  2. No evidence of malignancy in the right breast.  3. Ultrasound of the left axilla shows normal sized axillary  lymph nodes.   06/18/2017 Initial Biopsy   Diagnosis 06/18/17 Breast, left, needle core biopsy, 10:00 o'clock - INVASIVE MAMMARY CARCINOMA. At least G2  The malignant cells are negative for E-Cadherin, supporting a lobular phenotype.   06/18/2017 Initial Diagnosis   Malignant neoplasm of upper-inner quadrant of left breast in female, estrogen receptor positive ([place])   06/18/2017 Receptors her2   ER 95%+, PR 25%+, both strong staining  HER2- Ki67 30%    07/30/2017 Surgery   LEFT TOTAL MASTECTOMY WITH AXILLARY SENTINEL LYMPH NODE BIOPSY by Dr. Donne Hazel on 07/30/17   07/30/2017 Pathology Results   Diagnosis 07/30/17 1. Breast, simple mastectomy, Left Total - INVASIVE LOBULAR CARCINOMA, GRADE 2, SPANNING 3.5 CM. - LOBULAR CARCINOMA IN SITU. - INVASIVE CARCINOMA IS BROADLY 0.1 CM FROM POSTERIOR MARGIN. - PERINEURAL INVASION PRESENT. - SEE ONCOLOGY TABLE. 2. Lymph node, sentinel, biopsy, Left Axillary - ONE OF ONE LYMPH NODES NEGATIVE FOR CARCINOMA (0/1). 3. Lymph node, sentinel, biopsy, Left - ONE OF ONE LYMPH NODES NEGATIVE FOR CARCINOMA (0/1).   07/30/2017 Oncotype testing   Oncotype 07/30/17 Recurrence score is 23, an intermediate risk With a 10-year risk of recurrence at 15% with tamoxifen alone.     11/11/2020 Imaging   MRI Lumbar Spine  IMPRESSION: Diffuse osseous metastases. Chronic inferior L2 endplate deformity with mild height loss.   Likely late subacute pathologic fracture deformities involving the superior L1 endplate and right L3 lamina.   Multilevel spondylosis. Moderate to severe spinal canal and neural foraminal narrowing at the L3-4 and L4-5 levels.   Mild to moderate spinal canal and neural foraminal narrowing at the L5-S1 level.   11/28/2020 PET scan   IMPRESSION: 1. Widespread and diffuse hypermetabolic bony metastases. 2. No evidence for soft tissue metastases in the neck, chest, abdomen, or pelvis. 3.  Aortic Atherosclerois (ICD10-170.0)     11/29/2020 -   Chemotherapy   Zometa q31month starting 11/29/20. Held after first dose. Given her CKD will only give when she has hypercalcemia and may switch to Prolia.     12/04/2020 Imaging   MRI Brain  IMPRESSION: No evidence of intracranial metastatic disease.   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   Mild chronic microvascular ischemic changes. Small chronic right cerebellar infarct.     12/07/2020 - 12/21/2020 Radiation Therapy   palliative RT to left hip with Dr MLisbeth Renshawon 12/07/20-12/21/20   12/09/2020 Pathology Results   FINAL MICROSCOPIC DIAGNOSIS:   A. BONE, BIOPSY:  - Metastatic carcinoma.  - See comment.   COMMENT:  The morphology is compatible with metastatic lobular breast carcinoma.  Estrogen receptor, progesterone receptor and HER2/neu will be performed.   ADDENDUM:   PROGNOSTIC INDICATOR RESULTS:   Immunohistochemical and morphometric analysis performed manually   The tumor cells are EQUIVOCAL for Her2 (2+).  Her2 by FISH will be performed and the results will be reported  separately.   Estrogen Receptor:       POSITIVE, 50%, MODERATE-STRONG STAINING  Progesterone Receptor:   POSITIVE, 15%, MODERATE-STRONG STAINING    11/2020 -  Anti-estrogen oral therapy   First-line Anti-estrogen therapy Anastrozole '1mg'$  daily starting in 11/2020 and Ibrance starting in 12/2020    01/09/2021 -  Chemotherapy   Ibrance 3 weeks on/1 weeks off starting beginning of  01/09/21. Reduced to '75mg'$  starting with C2 (02/13/21) due to cytopenia and fatigue. Reduced to '75mg'$  2 weeks on/1 week off starting C3 on 03/13/21)    03/31/2021 PET scan   IMPRESSION: 1. Dramatic reduction in metabolic activity throughout the skeleton. Metabolic activity of diffuse sclerotic skeletal lesions is now essentially at background blood pool level. No focal metabolic activity. 2. Persistent diffuse confluent sclerotic skeletal metastasis unchanged. 3. No evidence of soft tissue breast cancer  metastasis.   10/13/2021 PET scan   IMPRESSION: 1. Widespread osseous metastatic disease again noted with areas of minimally increased metabolic activity within the sternal manubrium and L2 vertebral body. 2. No evidence of extra osseous metastatic disease.      INTERVAL HISTORY:  Shirley Wells was contacted for a follow up of metastatic breast cancer  . She was last seen by me on 12/07/2022.  Pt reports that she is feeling well. She denies having pain.   All other systems were reviewed with the patient and are negative.  MEDICAL HISTORY:  Past Medical History:  Diagnosis Date   CHICKENPOX, HX OF 03/21/2010   Qualifier: Diagnosis of  By: Marca Ancona RMA, Lucy     CVA (cerebral infarction) 02/2010   no residual deficits, a/w mid basal art stenosis, declined IC stent as rec by IR/neuro   Dyslipidemia    intol of statin due to >3x increase LFTs   GERD (gastroesophageal reflux disease)    hx   Hypertension, essential    Malignant neoplasm of upper-inner quadrant of left breast in female, estrogen receptor positive ([place]) 06/20/2017   Osteoarthritis    Stroke [facility])     SURGICAL HISTORY: Past Surgical History:  Procedure Laterality Date   CHOLECYSTECTOMY  1981   MASTECTOMY W/ SENTINEL NODE BIOPSY Left 07/30/2017   Procedure: LEFT TOTAL MASTECTOMY WITH AXILLARY SENTINEL LYMPH NODE BIOPSY;  Surgeon: Rolm Bookbinder, MD;  Location: [place];  Service: General;  Laterality: Left;    I have reviewed the social history and family history with the patient and they are unchanged from previous note.  ALLERGIES:  is allergic to influenza vaccines, pneumococcal vaccines, simvastatin, tetanus toxoids, and zoster vaccine live.  MEDICATIONS:  Current Outpatient Medications  Medication Sig Dispense Refill   acetaminophen (TYLENOL) 650 MG CR tablet Take 1,300 mg by mouth every 8 (eight) hours as needed for pain.     aspirin 81 MG tablet Take 81 mg by mouth 2 (two) times a week.     clopidogrel (PLAVIX)  75 MG tablet TAKE 1 TABLET(75 MG) BY MOUTH DAILY 90 tablet 1   furosemide (LASIX) 20 MG tablet Take 20 mg by mouth daily as needed for fluid.     lisinopril (ZESTRIL) 10 MG tablet TAKE 1 TABLET(10 MG) BY MOUTH DAILY 90 tablet 1   loratadine (CLARITIN) 10 MG tablet Take 10 mg by mouth daily as needed for allergies.     Menthol, Topical Analgesic, (BIOFREEZE EX) Apply 1 application topically daily as needed (muscle pain).     milk thistle 175 MG tablet Take 175 mg by mouth daily.     Multiple Vitamin (MULTIVITAMIN) capsule Take 1 capsule by mouth daily.     palbociclib (IBRANCE) 100 MG tablet Take 1 tablet (100 mg total) by mouth daily. Take days 1-14 and 7 days off for a  21 day cycle 21 tablet 1   No current facility-administered medications for this visit.    PHYSICAL EXAMINATION: ECOG PERFORMANCE STATUS: 1 - Symptomatic but completely ambulatory  There were no vitals filed for this visit. Wt Readings from Last 3 Encounters:  01/28/23 142 lb (64.4 kg)  01/16/23 145 lb (65.8 kg)  12/07/22 145 lb 6.4 oz (66 kg)     No vitals taken today, Exam not performed today  LABORATORY DATA:  I have reviewed the data as listed    Latest Ref Rng & Units 02/04/2023   11:04 AM 01/04/2023   10:58 AM 12/07/2022   12:50 PM  CBC  WBC 4.0 - 10.5 K/uL 2.6  2.7  3.3   Hemoglobin 12.0 - 15.0 g/dL 9.3  8.8  9.5   Hematocrit 36.0 - 46.0 % 27.4  25.2  27.7   Platelets 150 - 400 K/uL 161  103  119         Latest Ref Rng & Units 02/04/2023   11:04 AM 01/04/2023   10:58 AM 12/07/2022   12:50 PM  CMP  Glucose 70 - 99 mg/dL 91  88  87   BUN 8 - 23 mg/dL 26  32  53   Creatinine 0.44 - 1.00 mg/dL 1.73  1.77  2.97   Sodium 135 - 145 mmol/L 140  142  135   Potassium 3.5 - 5.1 mmol/L 4.7  4.3  4.2   Chloride 98 - 111 mmol/L 108  109  108   CO2 22 - 32 mmol/L '24  24  17   '$ Calcium 8.9 - 10.3 mg/dL 9.4  9.3  9.3   Total Protein 6.5 - 8.1 g/dL 6.9  7.0  7.0   Total Bilirubin 0.3 - 1.2 mg/dL 0.4  0.4  0.3    Alkaline Phos 38 - 126 U/L 128  140  142   AST 15 - 41 U/L '24  24  23   '$ ALT 0 - 44 U/L '17  16  14       '$ RADIOGRAPHIC STUDIES: I have personally reviewed the radiological images as listed and agreed with the findings in the report. No results found.    No orders of the defined types were placed in this encounter.  All questions were answered. The patient knows to call the clinic with any problems, questions or concerns. No barriers to learning was detected. The total time spent in the appointment was 22 minutes.     Truitt Merle, MD 02/06/2023   Felicity Coyer am acting as scribe for Truitt Merle, MD.   I have reviewed the above documentation for accuracy and completeness, and I agree with the above.

## 2023-03-04 ENCOUNTER — Inpatient Hospital Stay: Payer: Medicare FFS | Attending: Hematology

## 2023-03-04 ENCOUNTER — Other Ambulatory Visit: Payer: Self-pay

## 2023-03-04 VITALS — BP 187/58 | HR 64 | Temp 98.0°F | Resp 18

## 2023-03-04 DIAGNOSIS — Z5111 Encounter for antineoplastic chemotherapy: Secondary | ICD-10-CM | POA: Insufficient documentation

## 2023-03-04 DIAGNOSIS — C7951 Secondary malignant neoplasm of bone: Secondary | ICD-10-CM | POA: Diagnosis not present

## 2023-03-04 DIAGNOSIS — Z170 Estrogen receptor positive status [ER+]: Secondary | ICD-10-CM | POA: Diagnosis not present

## 2023-03-04 DIAGNOSIS — C50212 Malignant neoplasm of upper-inner quadrant of left female breast: Secondary | ICD-10-CM | POA: Insufficient documentation

## 2023-03-04 MED ORDER — FULVESTRANT 250 MG/5ML IM SOSY
500.0000 mg | PREFILLED_SYRINGE | Freq: Once | INTRAMUSCULAR | Status: AC
  Administered 2023-03-04: 500 mg via INTRAMUSCULAR
  Filled 2023-03-04: qty 10

## 2023-03-04 NOTE — Patient Instructions (Signed)

## 2023-03-06 ENCOUNTER — Other Ambulatory Visit (HOSPITAL_COMMUNITY): Payer: Self-pay

## 2023-03-14 ENCOUNTER — Other Ambulatory Visit: Payer: Self-pay | Admitting: Internal Medicine

## 2023-03-24 NOTE — Progress Notes (Unsigned)
Subjective:    Patient ID: Shirley Wells, female    DOB: Jun 27, 1941, 82 y.o.   MRN: [id number]     HPI Shirley Wells is here for follow up of her chronic medical problems.  She states she is doing well.  She does feel like her energy level is little bit better and she is a little bit more active.  She does some slight exercise at home-does not want to go to the gym  Doing well with the medication.    Medications and allergies reviewed with patient and updated if appropriate.  Current Outpatient Medications on File Prior to Visit  Medication Sig Dispense Refill   acetaminophen (TYLENOL) 650 MG CR tablet Take 1,300 mg by mouth every 8 (eight) hours as needed for pain.     aspirin 81 MG tablet Take 81 mg by mouth 2 (two) times a week.     clopidogrel (PLAVIX) 75 MG tablet TAKE 1 TABLET(75 MG) BY MOUTH DAILY 90 tablet 1   furosemide (LASIX) 20 MG tablet Take 20 mg by mouth daily as needed for fluid.     lisinopril (ZESTRIL) 10 MG tablet TAKE 1 TABLET(10 MG) BY MOUTH DAILY 90 tablet 1   loratadine (CLARITIN) 10 MG tablet Take 10 mg by mouth daily as needed for allergies.     Menthol, Topical Analgesic, (BIOFREEZE EX) Apply 1 application topically daily as needed (muscle pain).     milk thistle 175 MG tablet Take 175 mg by mouth daily.     Multiple Vitamin (MULTIVITAMIN) capsule Take 1 capsule by mouth daily.     palbociclib (IBRANCE) 100 MG tablet Take 1 tablet (100 mg total) by mouth daily. Take days 1-14 and 7 days off for a  21 day cycle 21 tablet 1   No current facility-administered medications on file prior to visit.     Review of Systems  Constitutional:  Negative for fever.  Respiratory:  Negative for cough, shortness of breath and wheezing.   Cardiovascular:  Positive for leg swelling (occ swelling). Negative for chest pain and palpitations.  Neurological:  Negative for light-headedness and headaches.       Objective:   Vitals:   03/25/23 1334  BP: (!) 148/68  Pulse:  60  Temp: 98.2 F (36.8 C)  SpO2: 99%   BP Readings from Last 3 Encounters:  03/25/23 (!) 148/68  03/04/23 (!) 187/58  02/04/23 (!) 195/61   Wt Readings from Last 3 Encounters:  03/25/23 144 lb (65.3 kg)  01/28/23 142 lb (64.4 kg)  01/16/23 145 lb (65.8 kg)   Body mass index is 26.34 kg/m.    Physical Exam Constitutional:      General: She is not in acute distress.    Appearance: Normal appearance.  HENT:     Head: Normocephalic and atraumatic.  Eyes:     Conjunctiva/sclera: Conjunctivae normal.  Cardiovascular:     Rate and Rhythm: Normal rate and regular rhythm.     Heart sounds: Normal heart sounds.  Pulmonary:     Effort: Pulmonary effort is normal. No respiratory distress.     Breath sounds: Normal breath sounds. No wheezing.  Musculoskeletal:     Cervical back: Neck supple.     Right lower leg: No edema.     Left lower leg: No edema.  Lymphadenopathy:     Cervical: No cervical adenopathy.  Skin:    General: Skin is warm and dry.     Findings: No rash.  Neurological:     Mental Status: She is alert. Mental status is at baseline.  Psychiatric:        Mood and Affect: Mood normal.        Behavior: Behavior normal.        Lab Results  Component Value Date   WBC 2.6 (L) 02/04/2023   HGB 9.3 (L) 02/04/2023   HCT 27.4 (L) 02/04/2023   PLT 161 02/04/2023   GLUCOSE 91 02/04/2023   CHOL 304 (H) 09/12/2020   TRIG (H) 09/12/2020    462.0 Triglyceride is over 400; calculations on Lipids are invalid.   HDL 47.70 09/12/2020   LDLDIRECT 58.0 09/12/2020   LDLCALC 232 (H) 04/30/2014   ALT 17 02/04/2023   AST 24 02/04/2023   NA 140 02/04/2023   K 4.7 02/04/2023   CL 108 02/04/2023   CREATININE 1.73 (H) 02/04/2023   BUN 26 (H) 02/04/2023   CO2 24 02/04/2023   TSH 1.67 11/02/2016   INR 1.1 12/09/2020   HGBA1C 5.1 03/19/2022   HGBA1C 5.1 03/19/2022   HGBA1C 5.1 (A) 03/19/2022   HGBA1C 5.1 03/19/2022     Assessment & Plan:    See Problem List for  Assessment and Plan of chronic medical problems.

## 2023-03-24 NOTE — Patient Instructions (Addendum)
      Blood work was ordered.   The lab is on the first floor.    Medications changes include :       A referral was ordered for XXX.     Someone will call you to schedule an appointment.    Return in about 6 months (around 09/24/2023) for Physical Exam.

## 2023-03-25 ENCOUNTER — Encounter: Payer: Self-pay | Admitting: Internal Medicine

## 2023-03-25 ENCOUNTER — Ambulatory Visit (INDEPENDENT_AMBULATORY_CARE_PROVIDER_SITE_OTHER): Payer: Medicare FFS | Admitting: Internal Medicine

## 2023-03-25 VITALS — BP 148/68 | HR 60 | Temp 98.2°F | Ht 62.0 in | Wt 144.0 lb

## 2023-03-25 DIAGNOSIS — E785 Hyperlipidemia, unspecified: Secondary | ICD-10-CM

## 2023-03-25 DIAGNOSIS — I10 Essential (primary) hypertension: Secondary | ICD-10-CM

## 2023-03-25 DIAGNOSIS — N1832 Chronic kidney disease, stage 3b: Secondary | ICD-10-CM

## 2023-03-25 DIAGNOSIS — R7303 Prediabetes: Secondary | ICD-10-CM | POA: Diagnosis not present

## 2023-03-25 DIAGNOSIS — R600 Localized edema: Secondary | ICD-10-CM

## 2023-03-25 DIAGNOSIS — M85851 Other specified disorders of bone density and structure, right thigh: Secondary | ICD-10-CM

## 2023-03-25 MED ORDER — AMLODIPINE BESYLATE 2.5 MG PO TABS: 2.5000 mg | ORAL_TABLET | Freq: Every day | ORAL | 3 refills | Status: DC

## 2023-03-25 MED ORDER — LISINOPRIL 10 MG PO TABS: ORAL_TABLET | ORAL | 1 refills | Status: DC

## 2023-03-25 NOTE — Assessment & Plan Note (Addendum)
Chronic Sugars have been well-controlled Will defer blood work at this time since she just had routine blood work done with oncology

## 2023-03-25 NOTE — Assessment & Plan Note (Signed)
Chronic Takes Lasix 20 mg daily as needed for edema, which she rarely takes Okay to continue

## 2023-03-25 NOTE — Assessment & Plan Note (Signed)
Chronic Progression over the years Stable recently BP not well controlled She is very resistant to changes in medication Add amlodipine 2.5 mg

## 2023-03-25 NOTE — Assessment & Plan Note (Signed)
Chronic Managed by oncology

## 2023-03-25 NOTE — Assessment & Plan Note (Signed)
Chronic Deferred statin Encouraged healthy diet 

## 2023-03-25 NOTE — Assessment & Plan Note (Signed)
Chronic BP not well controlled, CKD She is very resistant to any changes in medication Ideally would like to replace lisinopril for a different medication but she would prefer not to do that Will add amlodipine 2.5 mg daily today Continue lisinopril 10 mg daily

## 2023-03-31 NOTE — Assessment & Plan Note (Signed)
-  with L1/L3 fracture, hypercalcemia -S/p palliative RT with Dr Moody 1/12-1/26/22 for severe left hip pain. -She received one dose of Zometa on 11/29/20 for hypercalcinemia, but given her CKD this has been held. Her calcium has been WNL to low since Zometa injection. 

## 2023-03-31 NOTE — Assessment & Plan Note (Signed)
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 S/p left total mastectomy. She declined antiestrogen therapy due to concerns of side effects. She lost follow up after 07/2017.   -After 6 months of left hip and ribcage pain she was found to have diffuse bone metastasis on 11/28/20 PET. This was confirmed on 12/09/20 bone biopsy --she started first-line Anastrozole in 11/2020 and Ibrance on 01/09/21, not very compliant with Ibrance  -progressed on PET 10/03/2022 -changed her tx to fulvestrant injection and Verzenio. Due to GI side effect, I changed her Verzenio to Ibrance -Restaging PET scan from January 28, 2023 showed overall stable disease, with some lesion mildly improved -Will continue fulvestrant and Ibrance 

## 2023-04-01 ENCOUNTER — Inpatient Hospital Stay: Payer: Medicare FFS | Admitting: Hematology

## 2023-04-01 ENCOUNTER — Other Ambulatory Visit: Payer: Self-pay

## 2023-04-01 ENCOUNTER — Inpatient Hospital Stay: Payer: Medicare FFS | Attending: Hematology

## 2023-04-01 ENCOUNTER — Encounter: Payer: Self-pay | Admitting: Hematology

## 2023-04-01 ENCOUNTER — Inpatient Hospital Stay: Payer: Medicare FFS

## 2023-04-01 VITALS — BP 159/66 | HR 65 | Temp 97.6°F | Resp 18 | Ht 62.0 in | Wt 144.6 lb

## 2023-04-01 DIAGNOSIS — C50212 Malignant neoplasm of upper-inner quadrant of left female breast: Secondary | ICD-10-CM | POA: Diagnosis not present

## 2023-04-01 DIAGNOSIS — Z170 Estrogen receptor positive status [ER+]: Secondary | ICD-10-CM

## 2023-04-01 DIAGNOSIS — Z5111 Encounter for antineoplastic chemotherapy: Secondary | ICD-10-CM | POA: Diagnosis present

## 2023-04-01 DIAGNOSIS — N189 Chronic kidney disease, unspecified: Secondary | ICD-10-CM | POA: Diagnosis not present

## 2023-04-01 DIAGNOSIS — C7951 Secondary malignant neoplasm of bone: Secondary | ICD-10-CM | POA: Insufficient documentation

## 2023-04-01 LAB — CBC WITH DIFFERENTIAL/PLATELET
Abs Immature Granulocytes: 0 10*3/uL (ref 0.00–0.07)
Basophils Absolute: 0 10*3/uL (ref 0.0–0.1)
Basophils Relative: 2 %
Eosinophils Absolute: 0.1 10*3/uL (ref 0.0–0.5)
Eosinophils Relative: 2 %
HCT: 26.4 % — ABNORMAL LOW (ref 36.0–46.0)
Hemoglobin: 9 g/dL — ABNORMAL LOW (ref 12.0–15.0)
Immature Granulocytes: 0 %
Lymphocytes Relative: 44 %
Lymphs Abs: 1.2 10*3/uL (ref 0.7–4.0)
MCH: 42.5 pg — ABNORMAL HIGH (ref 26.0–34.0)
MCHC: 34.1 g/dL (ref 30.0–36.0)
MCV: 124.5 fL — ABNORMAL HIGH (ref 80.0–100.0)
Monocytes Absolute: 0.2 10*3/uL (ref 0.1–1.0)
Monocytes Relative: 7 %
Neutro Abs: 1.2 10*3/uL — ABNORMAL LOW (ref 1.7–7.7)
Neutrophils Relative %: 45 %
Platelets: 111 10*3/uL — ABNORMAL LOW (ref 150–400)
RBC: 2.12 MIL/uL — ABNORMAL LOW (ref 3.87–5.11)
RDW: 14.1 % (ref 11.5–15.5)
Smear Review: NORMAL
WBC: 2.7 10*3/uL — ABNORMAL LOW (ref 4.0–10.5)
nRBC: 0 % (ref 0.0–0.2)

## 2023-04-01 LAB — COMPREHENSIVE METABOLIC PANEL
ALT: 12 U/L (ref 0–44)
AST: 22 U/L (ref 15–41)
Albumin: 3.9 g/dL (ref 3.5–5.0)
Alkaline Phosphatase: 105 U/L (ref 38–126)
Anion gap: 9 (ref 5–15)
BUN: 43 mg/dL — ABNORMAL HIGH (ref 8–23)
CO2: 21 mmol/L — ABNORMAL LOW (ref 22–32)
Calcium: 9.1 mg/dL (ref 8.9–10.3)
Chloride: 110 mmol/L (ref 98–111)
Creatinine, Ser: 1.8 mg/dL — ABNORMAL HIGH (ref 0.44–1.00)
GFR, Estimated: 28 mL/min — ABNORMAL LOW (ref >60)
Glucose, Bld: 100 mg/dL — ABNORMAL HIGH (ref 70–99)
Potassium: 4.3 mmol/L (ref 3.5–5.1)
Sodium: 140 mmol/L (ref 135–145)
Total Bilirubin: 0.4 mg/dL (ref 0.3–1.2)
Total Protein: 7.1 g/dL (ref 6.5–8.1)

## 2023-04-01 MED ORDER — FULVESTRANT 250 MG/5ML IM SOSY
500.0000 mg | PREFILLED_SYRINGE | Freq: Once | INTRAMUSCULAR | Status: AC
  Administered 2023-04-01: 500 mg via INTRAMUSCULAR
  Filled 2023-04-01: qty 10

## 2023-04-01 MED ORDER — PALBOCICLIB 100 MG PO TABS: 100.0000 mg | ORAL_TABLET | Freq: Every day | ORAL | 2 refills | Status: DC

## 2023-04-01 NOTE — Progress Notes (Signed)
[facility]   [phone] Fax:([phone]) [phone]   Clinic Follow up Note   Patient Care Team: Pincus Sanes, MD as PCP - General (Internal Medicine) Micki Riley, MD as Consulting Physician (Neurology) Emelia Loron, MD as Consulting Physician (General Surgery) Malachy Mood, MD as Consulting Physician (Hematology) Dorothy Puffer, MD as Consulting Physician (Radiation Oncology)  Date of Service:  04/01/2023  CHIEF COMPLAINT: f/u of metastatic breast cancer   CURRENT THERAPY:   -Fulvestrant injection started on October 12, 2022 -Verzenio 100 mg twice daily started on October 25, 2022  ASSESSMENT:  Shirley Wells is a 82 y.o. 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 S/p left total mastectomy. She declined antiestrogen therapy due to concerns of side effects. She lost follow up after 07/2017.   -After 6 months of left hip and ribcage pain she was found to have diffuse bone metastasis on 11/28/20 PET. This was confirmed on 12/09/20 bone biopsy --she started first-line Anastrozole in 11/2020 and Ibrance on 01/09/21, not very compliant with Ibrance  -progressed on PET 10/03/2022 -changed her tx to fulvestrant injection and Verzenio. Due to GI side effect, I changed her Verzenio to [place] -Restaging PET scan from January 28, 2023 showed overall stable disease, with some lesion mildly improved -Will continue fulvestrant and Ibrance -She is clinically doing well, lab reviewed, exam unremarkable, no clinical concern for cancer recurrence -Plan to repeat CT or PET scan in September 2024, or sooner if she has clinical concern for progression.  Metastasis to bone [facility]) -with L1/L3 fracture, hypercalcemia -S/p palliative RT with Dr Mitzi Hansen 1/12-1/26/22  for severe left hip pain. -She received one dose of Zometa on 11/29/20 for hypercalcinemia, but given her CKD this has been held. Her calcium has been WNL to low since Zometa injection.      PLAN: -lab reviewed -I refill Ibrance -order CT scan next visit. -Continue fulvestrant injection monthly -lab and f/u 2 month    SUMMARY OF ONCOLOGIC HISTORY: Oncology History Overview Note   Cancer Staging  Malignant neoplasm of upper-inner quadrant of left breast in female, estrogen receptor positive (HCC) Staging form: Breast, AJCC 8th Edition - Clinical stage from 06/18/2017: Stage IB (cT2, cN0, cM0, G2, ER+, PR+, HER2-) - Signed by Malachy Mood, MD on 06/24/2017 Histologic grading system: 3 grade system - Pathologic stage from 07/30/2017: Stage IA (pT2, pN0, cM0, G2, ER+, PR+, HER2-, Oncotype DX score: 23) - Signed by Malachy Mood, MD on 08/23/2017 Neoadjuvant therapy: No Nuclear grade: G2 Multigene prognostic tests performed: Oncotype DX Recurrence score range: Greater than or equal to 11 Histologic grading system: 3 grade system Residual tumor (R): R0 - None Laterality: Left     Malignant neoplasm of upper-inner quadrant of left breast in female, estrogen receptor positive (HCC)  06/17/2017 Mammogram   [place] and [facility] diagnostic Breast Tomo Bilateral  IMPRESSION: 1. 3.4 palpable mass in the 10 o'clock position of the left breast is highly suspicious for malignancy.  2. No evidence of malignancy in the right breast.  3. Ultrasound of the left axilla shows normal sized axillary lymph nodes.   06/18/2017 Initial Biopsy   Diagnosis 06/18/17 Breast, left, needle core biopsy, 10:00 o'clock - INVASIVE MAMMARY CARCINOMA. At least G2  The malignant cells are negative for E-Cadherin, supporting a  lobular phenotype.   06/18/2017 Initial Diagnosis   Malignant neoplasm of upper-inner quadrant of left breast in female, estrogen receptor positive (HCC)   06/18/2017 Receptors her2   ER 95%+, PR 25%+, both  strong staining  HER2- Ki67 30%    07/30/2017 Surgery   LEFT TOTAL MASTECTOMY WITH AXILLARY SENTINEL LYMPH NODE BIOPSY by Dr. Dwain Sarna on 07/30/17   07/30/2017 Pathology Results   Diagnosis 07/30/17 1. Breast, simple mastectomy, Left Total - INVASIVE LOBULAR CARCINOMA, GRADE 2, SPANNING 3.5 CM. - LOBULAR CARCINOMA IN SITU. - INVASIVE CARCINOMA IS BROADLY 0.1 CM FROM POSTERIOR MARGIN. - PERINEURAL INVASION PRESENT. - SEE ONCOLOGY TABLE. 2. Lymph node, sentinel, biopsy, Left Axillary - ONE OF ONE LYMPH NODES NEGATIVE FOR CARCINOMA (0/1). 3. Lymph node, sentinel, biopsy, Left - ONE OF ONE LYMPH NODES NEGATIVE FOR CARCINOMA (0/1).   07/30/2017 Oncotype testing   Oncotype 07/30/17 Recurrence score is 23, an intermediate risk With a 10-year risk of recurrence at 15% with tamoxifen alone.     11/11/2020 Imaging   MRI Lumbar Spine  IMPRESSION: Diffuse osseous metastases. Chronic inferior L2 endplate deformity with mild height loss.   Likely late subacute pathologic fracture deformities involving the superior L1 endplate and right L3 lamina.   Multilevel spondylosis. Moderate to severe spinal canal and neural foraminal narrowing at the L3-4 and L4-5 levels.   Mild to moderate spinal canal and neural foraminal narrowing at the L5-S1 level.   11/28/2020 PET scan   IMPRESSION: 1. Widespread and diffuse hypermetabolic bony metastases. 2. No evidence for soft tissue metastases in the neck, chest, abdomen, or pelvis. 3.  Aortic Atherosclerois (ICD10-170.0)     11/29/2020 -  Chemotherapy   Zometa q16months starting 11/29/20. Held after first dose. Given her CKD will only give when she has hypercalcemia and may switch to Prolia.     12/04/2020 Imaging   MRI Brain  IMPRESSION: No evidence of intracranial metastatic disease.   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   Mild chronic microvascular ischemic changes. Small chronic right cerebellar infarct.     12/07/2020 -  12/21/2020 Radiation Therapy   palliative RT to left hip with Dr Mitzi Hansen on 12/07/20-12/21/20   12/09/2020 Pathology Results   FINAL MICROSCOPIC DIAGNOSIS:   A. BONE, BIOPSY:  - Metastatic carcinoma.  - See comment.   COMMENT:  The morphology is compatible with metastatic lobular breast carcinoma.  Estrogen receptor, progesterone receptor and HER2/neu will be performed.   ADDENDUM:   PROGNOSTIC INDICATOR RESULTS:   Immunohistochemical and morphometric analysis performed manually   The tumor cells are EQUIVOCAL for Her2 (2+).  Her2 by FISH will be performed and the results will be reported  separately.   Estrogen Receptor:       POSITIVE, 50%, MODERATE-STRONG STAINING  Progesterone Receptor:   POSITIVE, 15%, MODERATE-STRONG STAINING    11/2020 -  Anti-estrogen oral therapy   First-line Anti-estrogen therapy Anastrozole 1mg  daily starting in 11/2020 and Ibrance starting in 12/2020    01/09/2021 -  Chemotherapy   Ibrance 3 weeks on/1 weeks off starting beginning of 01/09/21. Reduced to 75mg  starting with C2 (02/13/21) due to cytopenia and fatigue. Reduced to 75mg  2 weeks on/1 week off starting C3 on 03/13/21)    03/31/2021 PET scan   IMPRESSION: 1. Dramatic reduction in metabolic activity throughout the skeleton. Metabolic activity of diffuse sclerotic skeletal lesions is now essentially at background blood pool level. No focal metabolic activity. 2. Persistent diffuse confluent sclerotic skeletal metastasis unchanged. 3. No  evidence of soft tissue breast cancer metastasis.   10/13/2021 PET scan   IMPRESSION: 1. Widespread osseous metastatic disease again noted with areas of minimally increased metabolic activity within the sternal manubrium and L2 vertebral body. 2. No evidence of extra osseous metastatic disease.      INTERVAL HISTORY:  SAVONNAH LESEBERG is here for a follow up of metastatic breast cancer . She was last seen by me on 02/06/2023. She presents to the clinic accompanied  by daughter. Pt state that her toes have tingling  sensation all the time, denies having the tingling sensation in her hands. Pt doesn't have no fever or headaches with Ibrance. Pt state that she has a little more strength and she gets around well. Pt does line dancing at home for activity.    All other systems were reviewed with the patient and are negative.  MEDICAL HISTORY:  Past Medical History:  Diagnosis Date   CHICKENPOX, HX OF 03/21/2010   Qualifier: Diagnosis of  By: Charlsie Quest RMA, Lucy     CVA (cerebral infarction) 02/2010   no residual deficits, a/w mid basal art stenosis, declined IC stent as rec by IR/neuro   Dyslipidemia    intol of statin due to >3x increase LFTs   GERD (gastroesophageal reflux disease)    hx   Hypertension, essential    Malignant neoplasm of upper-inner quadrant of left breast in female, estrogen receptor positive (HCC) 06/20/2017   Osteoarthritis    Stroke [facility])     SURGICAL HISTORY: Past Surgical History:  Procedure Laterality Date   CHOLECYSTECTOMY  1981   MASTECTOMY W/ SENTINEL NODE BIOPSY Left 07/30/2017   Procedure: LEFT TOTAL MASTECTOMY WITH AXILLARY SENTINEL LYMPH NODE BIOPSY;  Surgeon: Emelia Loron, MD;  Location: [facility] OR;  Service: General;  Laterality: Left;    I have reviewed the social history and family history with the patient and they are unchanged from previous note.  ALLERGIES:  is allergic to influenza vaccines, pneumococcal vaccines, simvastatin, tetanus toxoids, and zoster vaccine live.  MEDICATIONS:  Current Outpatient Medications  Medication Sig Dispense Refill   acetaminophen (TYLENOL) 650 MG CR tablet Take 1,300 mg by mouth every 8 (eight) hours as needed for pain.     amLODipine (NORVASC) 2.5 MG tablet Take 1 tablet (2.5 mg total) by mouth daily. 90 tablet 3   aspirin 81 MG tablet Take 81 mg by mouth 2 (two) times a week.     clopidogrel (PLAVIX) 75 MG tablet TAKE 1 TABLET(75 MG) BY MOUTH DAILY 90 tablet 1   furosemide  (LASIX) 20 MG tablet Take 20 mg by mouth daily as needed for fluid.     lisinopril (ZESTRIL) 10 MG tablet TAKE 1 TABLET(10 MG) BY MOUTH DAILY 90 tablet 1   loratadine (CLARITIN) 10 MG tablet Take 10 mg by mouth daily as needed for allergies.     Menthol, Topical Analgesic, (BIOFREEZE EX) Apply 1 application topically daily as needed (muscle pain).     milk thistle 175 MG tablet Take 175 mg by mouth daily.     Multiple Vitamin (MULTIVITAMIN) capsule Take 1 capsule by mouth daily.     palbociclib (IBRANCE) 100 MG tablet Take 1 tablet (100 mg total) by mouth daily. Take days 1-14 and 7 days off for a  21 day cycle 21 tablet 2   No current facility-administered medications for this visit.   Facility-Administered Medications Ordered in Other Visits  Medication Dose Route Frequency Provider Last Rate Last Admin   fulvestrant (  FASLODEX) injection 500 mg  500 mg Intramuscular Once Malachy Mood, MD        PHYSICAL EXAMINATION: ECOG PERFORMANCE STATUS: 1 - Symptomatic but completely ambulatory  Vitals:   04/01/23 1304  BP: (!) 159/66  Pulse: 65  Resp: 18  Temp: 97.6 F (36.4 C)  SpO2: 99%   Wt Readings from Last 3 Encounters:  04/01/23 144 lb 9.6 oz (65.6 kg)  03/25/23 144 lb (65.3 kg)  01/28/23 142 lb (64.4 kg)     GENERAL:alert, no distress and comfortable SKIN: skin color normal, no rashes or significant lesions EYES: normal, Conjunctiva are pink and non-injected, sclera clear  NEURO: alert & oriented x 3 with fluent speech LYMPH:(-)  no palpable lymphadenopathy in the cervical, axillary  LUNGS:(-) clear to auscultation and percussion with normal breathing effort HEART:(-)  regular rate & rhythm and no murmurs and (-) no lower extremity edema ABDOMEN:(-)abdomen soft, (-) non-tender and normal bowel sounds  LABORATORY DATA:  I have reviewed the data as listed    Latest Ref Rng & Units 04/01/2023   12:21 PM 02/04/2023   11:04 AM 01/04/2023   10:58 AM  CBC  WBC 4.0 - 10.5 K/uL 2.7   2.6  2.7   Hemoglobin 12.0 - 15.0 g/dL 9.0  9.3  8.8   Hematocrit 36.0 - 46.0 % 26.4  27.4  25.2   Platelets 150 - 400 K/uL 111  161  103         Latest Ref Rng & Units 04/01/2023   12:21 PM 02/04/2023   11:04 AM 01/04/2023   10:58 AM  CMP  Glucose 70 - 99 mg/dL 161  91  88   BUN 8 - 23 mg/dL 43  26  32   Creatinine 0.44 - 1.00 mg/dL 0.96  0.45  4.09   Sodium 135 - 145 mmol/L 140  140  142   Potassium 3.5 - 5.1 mmol/L 4.3  4.7  4.3   Chloride 98 - 111 mmol/L 110  108  109   CO2 22 - 32 mmol/L 21  24  24    Calcium 8.9 - 10.3 mg/dL 9.1  9.4  9.3   Total Protein 6.5 - 8.1 g/dL 7.1  6.9  7.0   Total Bilirubin 0.3 - 1.2 mg/dL 0.4  0.4  0.4   Alkaline Phos 38 - 126 U/L 105  128  140   AST 15 - 41 U/L 22  24  24    ALT 0 - 44 U/L 12  17  16        RADIOGRAPHIC STUDIES: I have personally reviewed the radiological images as listed and agreed with the findings in the report. No results found.    No orders of the defined types were placed in this encounter.  All questions were answered. The patient knows to call the clinic with any problems, questions or concerns. No barriers to learning was detected. The total time spent in the appointment was 25 minutes.     Malachy Mood, MD 04/01/2023   Carolin Coy, CMA, am acting as scribe for Malachy Mood, MD.   I have reviewed the above documentation for accuracy and completeness, and I agree with the above.

## 2023-04-02 ENCOUNTER — Other Ambulatory Visit: Payer: Medicare FFS

## 2023-04-02 ENCOUNTER — Ambulatory Visit: Payer: Medicare FFS

## 2023-04-02 ENCOUNTER — Ambulatory Visit: Payer: Medicare FFS | Admitting: Hematology

## 2023-05-06 ENCOUNTER — Inpatient Hospital Stay: Payer: Medicare FFS | Attending: Hematology

## 2023-05-06 VITALS — BP 168/59 | HR 68 | Temp 97.8°F | Resp 18

## 2023-05-06 DIAGNOSIS — Z170 Estrogen receptor positive status [ER+]: Secondary | ICD-10-CM | POA: Diagnosis not present

## 2023-05-06 DIAGNOSIS — C50212 Malignant neoplasm of upper-inner quadrant of left female breast: Secondary | ICD-10-CM | POA: Insufficient documentation

## 2023-05-06 DIAGNOSIS — C7951 Secondary malignant neoplasm of bone: Secondary | ICD-10-CM | POA: Insufficient documentation

## 2023-05-06 DIAGNOSIS — Z5111 Encounter for antineoplastic chemotherapy: Secondary | ICD-10-CM | POA: Diagnosis present

## 2023-05-06 MED ORDER — FULVESTRANT 250 MG/5ML IM SOSY
500.0000 mg | PREFILLED_SYRINGE | Freq: Once | INTRAMUSCULAR | Status: AC
  Administered 2023-05-06: 500 mg via INTRAMUSCULAR
  Filled 2023-05-06: qty 10

## 2023-06-02 NOTE — Assessment & Plan Note (Signed)
-  with L1/L3 fracture, hypercalcemia -S/p palliative RT with Dr Moody 1/12-1/26/22 for severe left hip pain. -She received one dose of Zometa on 11/29/20 for hypercalcinemia, but given her CKD this has been held. Her calcium has been WNL to low since Zometa injection. 

## 2023-06-02 NOTE — Assessment & Plan Note (Signed)
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 S/p left total mastectomy. She declined antiestrogen therapy due to concerns of side effects. She lost follow up after 07/2017.   -After 6 months of left hip and ribcage pain she was found to have diffuse bone metastasis on 11/28/20 PET. This was confirmed on 12/09/20 bone biopsy --she started first-line Anastrozole in 11/2020 and Ibrance on 01/09/21, not very compliant with Ibrance  -progressed on PET 10/03/2022 -changed her tx to fulvestrant injection and Verzenio. Due to GI side effect, I changed her Verzenio to [place] -Restaging PET scan from January 28, 2023 showed overall stable disease, with some lesion mildly improved -Will continue fulvestrant and Ibrance -She is clinically doing well, lab reviewed, exam unremarkable, no clinical concern for cancer recurrence -Plan to repeat CT or PET scan in September 2024, or sooner if she has clinical concern for progression.

## 2023-06-03 ENCOUNTER — Telehealth: Payer: Self-pay | Admitting: *Deleted

## 2023-06-03 ENCOUNTER — Inpatient Hospital Stay: Payer: Medicare FFS | Attending: Hematology

## 2023-06-03 ENCOUNTER — Other Ambulatory Visit: Payer: Self-pay | Admitting: Nurse Practitioner

## 2023-06-03 ENCOUNTER — Inpatient Hospital Stay: Payer: Medicare FFS | Admitting: Hematology

## 2023-06-03 ENCOUNTER — Inpatient Hospital Stay: Payer: Medicare FFS

## 2023-06-03 ENCOUNTER — Encounter: Payer: Self-pay | Admitting: Hematology

## 2023-06-03 VITALS — BP 176/59 | HR 67 | Temp 98.0°F | Resp 18 | Ht 62.0 in | Wt 142.6 lb

## 2023-06-03 DIAGNOSIS — Z79818 Long term (current) use of other agents affecting estrogen receptors and estrogen levels: Secondary | ICD-10-CM | POA: Insufficient documentation

## 2023-06-03 DIAGNOSIS — C7951 Secondary malignant neoplasm of bone: Secondary | ICD-10-CM | POA: Diagnosis not present

## 2023-06-03 DIAGNOSIS — Z170 Estrogen receptor positive status [ER+]: Secondary | ICD-10-CM

## 2023-06-03 DIAGNOSIS — Z5111 Encounter for antineoplastic chemotherapy: Secondary | ICD-10-CM | POA: Insufficient documentation

## 2023-06-03 DIAGNOSIS — Z9012 Acquired absence of left breast and nipple: Secondary | ICD-10-CM | POA: Diagnosis not present

## 2023-06-03 DIAGNOSIS — C50212 Malignant neoplasm of upper-inner quadrant of left female breast: Secondary | ICD-10-CM

## 2023-06-03 DIAGNOSIS — N189 Chronic kidney disease, unspecified: Secondary | ICD-10-CM | POA: Diagnosis not present

## 2023-06-03 LAB — CBC WITH DIFFERENTIAL/PLATELET
Abs Immature Granulocytes: 0.01 10*3/uL (ref 0.00–0.07)
Basophils Absolute: 0.1 10*3/uL (ref 0.0–0.1)
Basophils Relative: 2 %
Eosinophils Absolute: 0 10*3/uL (ref 0.0–0.5)
Eosinophils Relative: 2 %
HCT: 27 % — ABNORMAL LOW (ref 36.0–46.0)
Hemoglobin: 9.3 g/dL — ABNORMAL LOW (ref 12.0–15.0)
Immature Granulocytes: 0 %
Lymphocytes Relative: 35 %
Lymphs Abs: 0.9 10*3/uL (ref 0.7–4.0)
MCH: 43.1 pg — ABNORMAL HIGH (ref 26.0–34.0)
MCHC: 34.4 g/dL (ref 30.0–36.0)
MCV: 125 fL — ABNORMAL HIGH (ref 80.0–100.0)
Monocytes Absolute: 0.2 10*3/uL (ref 0.1–1.0)
Monocytes Relative: 8 %
Neutro Abs: 1.3 10*3/uL — ABNORMAL LOW (ref 1.7–7.7)
Neutrophils Relative %: 53 %
Platelets: 161 10*3/uL (ref 150–400)
RBC: 2.16 MIL/uL — ABNORMAL LOW (ref 3.87–5.11)
RDW: 13.3 % (ref 11.5–15.5)
Smear Review: NORMAL
WBC: 2.5 10*3/uL — ABNORMAL LOW (ref 4.0–10.5)
nRBC: 0 % (ref 0.0–0.2)

## 2023-06-03 LAB — COMPREHENSIVE METABOLIC PANEL
ALT: 11 U/L (ref 0–44)
AST: 21 U/L (ref 15–41)
Albumin: 3.7 g/dL (ref 3.5–5.0)
Alkaline Phosphatase: 140 U/L — ABNORMAL HIGH (ref 38–126)
Anion gap: 8 (ref 5–15)
BUN: 40 mg/dL — ABNORMAL HIGH (ref 8–23)
CO2: 24 mmol/L (ref 22–32)
Calcium: 9.7 mg/dL (ref 8.9–10.3)
Chloride: 108 mmol/L (ref 98–111)
Creatinine, Ser: 1.83 mg/dL — ABNORMAL HIGH (ref 0.44–1.00)
GFR, Estimated: 27 mL/min — ABNORMAL LOW (ref >60)
Glucose, Bld: 86 mg/dL (ref 70–99)
Potassium: 4.7 mmol/L (ref 3.5–5.1)
Sodium: 140 mmol/L (ref 135–145)
Total Bilirubin: 0.5 mg/dL (ref 0.3–1.2)
Total Protein: 7.2 g/dL (ref 6.5–8.1)

## 2023-06-03 MED ORDER — FULVESTRANT 250 MG/5ML IM SOSY
500.0000 mg | PREFILLED_SYRINGE | Freq: Once | INTRAMUSCULAR | Status: AC
  Administered 2023-06-03: 500 mg via INTRAMUSCULAR
  Filled 2023-06-03: qty 10

## 2023-06-03 MED ORDER — PALBOCICLIB 100 MG PO TABS: 100.0000 mg | ORAL_TABLET | Freq: Every day | ORAL | 2 refills | Status: DC

## 2023-06-03 NOTE — Progress Notes (Signed)
[facility]   [phone]) [phone] Fax:([phone]) [phone]   Clinic Follow up Note   Patient Care Team: Pincus Sanes, MD as PCP - General (Internal Medicine) Micki Riley, MD as Consulting Physician (Neurology) Emelia Loron, MD as Consulting Physician (General Surgery) Malachy Mood, MD as Consulting Physician (Hematology) Dorothy Puffer, MD as Consulting Physician (Radiation Oncology)  Date of Service:  06/03/2023  CHIEF COMPLAINT: f/u of metastatic breast cancer   CURRENT THERAPY:   -Fulvestrant injection started on October 12, 2022 - Ibrance   100 mg starting 10/2022  ASSESSMENT:  Shirley Wells is a 82 y.o. 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 S/p left total mastectomy. She declined antiestrogen therapy due to concerns of side effects. She lost follow up after 07/2017.   -After 6 months of left hip and ribcage pain she was found to have diffuse bone metastasis on 11/28/20 PET. This was confirmed on 12/09/20 bone biopsy --she started first-line Anastrozole in 11/2020 and Ibrance on 01/09/21, not very compliant with Ibrance  -progressed on PET 10/03/2022 -changed her tx to fulvestrant injection and Verzenio. Due to GI side effect, I changed her Verzenio to [place] -Restaging PET scan from January 28, 2023 showed overall stable disease, with some lesion mildly improved -Will continue fulvestrant and Ibrance -She is clinically doing well, lab reviewed, exam unremarkable, no clinical concern for cancer recurrence -Plan to repeat CT or PET scan in Oct 2024, or sooner if she has clinical concern for progression.  Metastasis to bone [facility]) -with L1/L3 fracture, hypercalcemia -S/p palliative RT with Dr Mitzi Hansen 1/12-1/26/22 for severe left hip  pain. -She received one dose of Zometa on 11/29/20 for hypercalcinemia, but given her CKD this has been held. Her calcium has been WNL to low since Zometa injection.      PLAN: -lab reviewed. -I order PET scan in 3 months, f/u after PET  -continue with the fulvestrant injection every 4 weeks  -proceed with the Injection today -I refill Ibrance  SUMMARY OF ONCOLOGIC HISTORY: Oncology History Overview Note   Cancer Staging  Malignant neoplasm of upper-inner quadrant of left breast in female, estrogen receptor positive (HCC) Staging form: Breast, AJCC 8th Edition - Clinical stage from 06/18/2017: Stage IB (cT2, cN0, cM0, G2, ER+, PR+, HER2-) - Signed by Malachy Mood, MD on 06/24/2017 Histologic grading system: 3 grade system - Pathologic stage from 07/30/2017: Stage IA (pT2, pN0, cM0, G2, ER+, PR+, HER2-, Oncotype DX score: 23) - Signed by Malachy Mood, MD on 08/23/2017 Neoadjuvant therapy: No Nuclear grade: G2 Multigene prognostic tests performed: Oncotype DX Recurrence score range: Greater than or equal to 11 Histologic grading system: 3 grade system Residual tumor (R): R0 - None Laterality: Left     Malignant neoplasm of upper-inner quadrant of left breast in female, estrogen receptor positive (HCC)  06/17/2017 Mammogram   [place] and [facility] diagnostic Breast Tomo Bilateral  IMPRESSION: 1. 3.4 palpable mass in the 10 o'clock position of the left breast is highly suspicious for malignancy.  2. No evidence of malignancy in the right breast.  3. Ultrasound of the left axilla shows normal sized axillary lymph nodes.   06/18/2017 Initial Biopsy   Diagnosis 06/18/17 Breast, left, needle core biopsy, 10:00 o'clock - INVASIVE MAMMARY CARCINOMA. At least G2  The malignant  cells are negative for E-Cadherin, supporting a lobular phenotype.   06/18/2017 Initial Diagnosis   Malignant neoplasm of upper-inner quadrant of left breast in female, estrogen receptor positive (HCC)   06/18/2017 Receptors her2    ER 95%+, PR 25%+, both strong staining  HER2- Ki67 30%    07/30/2017 Surgery   LEFT TOTAL MASTECTOMY WITH AXILLARY SENTINEL LYMPH NODE BIOPSY by Dr. Dwain Sarna on 07/30/17   07/30/2017 Pathology Results   Diagnosis 07/30/17 1. Breast, simple mastectomy, Left Total - INVASIVE LOBULAR CARCINOMA, GRADE 2, SPANNING 3.5 CM. - LOBULAR CARCINOMA IN SITU. - INVASIVE CARCINOMA IS BROADLY 0.1 CM FROM POSTERIOR MARGIN. - PERINEURAL INVASION PRESENT. - SEE ONCOLOGY TABLE. 2. Lymph node, sentinel, biopsy, Left Axillary - ONE OF ONE LYMPH NODES NEGATIVE FOR CARCINOMA (0/1). 3. Lymph node, sentinel, biopsy, Left - ONE OF ONE LYMPH NODES NEGATIVE FOR CARCINOMA (0/1).   07/30/2017 Oncotype testing   Oncotype 07/30/17 Recurrence score is 23, an intermediate risk With a 10-year risk of recurrence at 15% with tamoxifen alone.     11/11/2020 Imaging   MRI Lumbar Spine  IMPRESSION: Diffuse osseous metastases. Chronic inferior L2 endplate deformity with mild height loss.   Likely late subacute pathologic fracture deformities involving the superior L1 endplate and right L3 lamina.   Multilevel spondylosis. Moderate to severe spinal canal and neural foraminal narrowing at the L3-4 and L4-5 levels.   Mild to moderate spinal canal and neural foraminal narrowing at the L5-S1 level.   11/28/2020 PET scan   IMPRESSION: 1. Widespread and diffuse hypermetabolic bony metastases. 2. No evidence for soft tissue metastases in the neck, chest, abdomen, or pelvis. 3.  Aortic Atherosclerois (ICD10-170.0)     11/29/2020 -  Chemotherapy   Zometa q64months starting 11/29/20. Held after first dose. Given her CKD will only give when she has hypercalcemia and may switch to Prolia.     12/04/2020 Imaging   MRI Brain  IMPRESSION: No evidence of intracranial metastatic disease.   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   Mild chronic microvascular ischemic changes. Small chronic right cerebellar infarct.      12/07/2020 - 12/21/2020 Radiation Therapy   palliative RT to left hip with Dr Mitzi Hansen on 12/07/20-12/21/20   12/09/2020 Pathology Results   FINAL MICROSCOPIC DIAGNOSIS:   A. BONE, BIOPSY:  - Metastatic carcinoma.  - See comment.   COMMENT:  The morphology is compatible with metastatic lobular breast carcinoma.  Estrogen receptor, progesterone receptor and HER2/neu will be performed.   ADDENDUM:   PROGNOSTIC INDICATOR RESULTS:   Immunohistochemical and morphometric analysis performed manually   The tumor cells are EQUIVOCAL for Her2 (2+).  Her2 by FISH will be performed and the results will be reported  separately.   Estrogen Receptor:       POSITIVE, 50%, MODERATE-STRONG STAINING  Progesterone Receptor:   POSITIVE, 15%, MODERATE-STRONG STAINING    11/2020 -  Anti-estrogen oral therapy   First-line Anti-estrogen therapy Anastrozole 1mg  daily starting in 11/2020 and Ibrance starting in 12/2020    01/09/2021 -  Chemotherapy   Ibrance 3 weeks on/1 weeks off starting beginning of 01/09/21. Reduced to 75mg  starting with C2 (02/13/21) due to cytopenia and fatigue. Reduced to 75mg  2 weeks on/1 week off starting C3 on 03/13/21)    03/31/2021 PET scan   IMPRESSION: 1. Dramatic reduction in metabolic activity throughout the skeleton. Metabolic activity of diffuse sclerotic skeletal lesions is now essentially at background blood pool level. No focal metabolic activity. 2. Persistent diffuse  confluent sclerotic skeletal metastasis unchanged. 3. No evidence of soft tissue breast cancer metastasis.   10/13/2021 PET scan   IMPRESSION: 1. Widespread osseous metastatic disease again noted with areas of minimally increased metabolic activity within the sternal manubrium and L2 vertebral body. 2. No evidence of extra osseous metastatic disease.      INTERVAL HISTORY:  SHARNEICE PLEGER is here for a follow up of metastatic breast cancer . She was last seen by me on 04/01/2023. She presents to the  clinic accompanied by daughter. Pt state that she has no issues with her BM. Pt state that she has intermittent back pain and she take Tylenol as needed. She denies having chill, fever     All other systems were reviewed with the patient and are negative.  MEDICAL HISTORY:  Past Medical History:  Diagnosis Date   CHICKENPOX, HX OF 03/21/2010   Qualifier: Diagnosis of  By: Charlsie Quest RMA, Lucy     CVA (cerebral infarction) 02/2010   no residual deficits, a/w mid basal art stenosis, declined IC stent as rec by IR/neuro   Dyslipidemia    intol of statin due to >3x increase LFTs   GERD (gastroesophageal reflux disease)    hx   Hypertension, essential    Malignant neoplasm of upper-inner quadrant of left breast in female, estrogen receptor positive (HCC) 06/20/2017   Osteoarthritis    Stroke [facility])     SURGICAL HISTORY: Past Surgical History:  Procedure Laterality Date   CHOLECYSTECTOMY  1981   MASTECTOMY W/ SENTINEL NODE BIOPSY Left 07/30/2017   Procedure: LEFT TOTAL MASTECTOMY WITH AXILLARY SENTINEL LYMPH NODE BIOPSY;  Surgeon: Emelia Loron, MD;  Location: [facility] OR;  Service: General;  Laterality: Left;    I have reviewed the social history and family history with the patient and they are unchanged from previous note.  ALLERGIES:  is allergic to influenza vaccines, pneumococcal vaccines, simvastatin, tetanus toxoids, and zoster vaccine live.  MEDICATIONS:  Current Outpatient Medications  Medication Sig Dispense Refill   acetaminophen (TYLENOL) 650 MG CR tablet Take 1,300 mg by mouth every 8 (eight) hours as needed for pain.     amLODipine (NORVASC) 2.5 MG tablet Take 1 tablet (2.5 mg total) by mouth daily. 90 tablet 3   aspirin 81 MG tablet Take 81 mg by mouth 2 (two) times a week.     clopidogrel (PLAVIX) 75 MG tablet TAKE 1 TABLET(75 MG) BY MOUTH DAILY 90 tablet 1   furosemide (LASIX) 20 MG tablet Take 20 mg by mouth daily as needed for fluid.     lisinopril (ZESTRIL) 10 MG tablet  TAKE 1 TABLET(10 MG) BY MOUTH DAILY 90 tablet 1   loratadine (CLARITIN) 10 MG tablet Take 10 mg by mouth daily as needed for allergies.     Menthol, Topical Analgesic, (BIOFREEZE EX) Apply 1 application topically daily as needed (muscle pain).     milk thistle 175 MG tablet Take 175 mg by mouth daily.     Multiple Vitamin (MULTIVITAMIN) capsule Take 1 capsule by mouth daily.     palbociclib (IBRANCE) 100 MG tablet Take 1 tablet (100 mg total) by mouth daily. Take days 1-14 and 7 days off for a  21 day cycle 21 tablet 2   No current facility-administered medications for this visit.    PHYSICAL EXAMINATION: ECOG PERFORMANCE STATUS: 1 - Symptomatic but completely ambulatory  Vitals:   06/03/23 1328  BP: (!) 176/59  Pulse: 67  Resp: 18  Temp: 98 F (36.7  C)  SpO2: 97%   Wt Readings from Last 3 Encounters:  06/03/23 142 lb 9.6 oz (64.7 kg)  04/01/23 144 lb 9.6 oz (65.6 kg)  03/25/23 144 lb (65.3 kg)     GENERAL:alert, no distress and comfortable SKIN: skin color normal, no rashes or significant lesions EYES: normal, Conjunctiva are pink and non-injected, sclera clear  NEURO: alert & oriented x 3 with fluent speech  LABORATORY DATA:  I have reviewed the data as listed    Latest Ref Rng & Units 06/03/2023   12:55 PM 04/01/2023   12:21 PM 02/04/2023   11:04 AM  CBC  WBC 4.0 - 10.5 K/uL 2.5  2.7  2.6   Hemoglobin 12.0 - 15.0 g/dL 9.3  9.0  9.3   Hematocrit 36.0 - 46.0 % 27.0  26.4  27.4   Platelets 150 - 400 K/uL 161  111  161         Latest Ref Rng & Units 06/03/2023   12:55 PM 04/01/2023   12:21 PM 02/04/2023   11:04 AM  CMP  Glucose 70 - 99 mg/dL 86  161  91   BUN 8 - 23 mg/dL 40  43  26   Creatinine 0.44 - 1.00 mg/dL 0.96  0.45  4.09   Sodium 135 - 145 mmol/L 140  140  140   Potassium 3.5 - 5.1 mmol/L 4.7  4.3  4.7   Chloride 98 - 111 mmol/L 108  110  108   CO2 22 - 32 mmol/L 24  21  24    Calcium 8.9 - 10.3 mg/dL 9.7  9.1  9.4   Total Protein 6.5 - 8.1 g/dL 7.2  7.1   6.9   Total Bilirubin 0.3 - 1.2 mg/dL 0.5  0.4  0.4   Alkaline Phos 38 - 126 U/L 140  105  128   AST 15 - 41 U/L 21  22  24    ALT 0 - 44 U/L 11  12  17        RADIOGRAPHIC STUDIES: I have personally reviewed the radiological images as listed and agreed with the findings in the report. No results found.    Orders Placed This Encounter  Procedures   NM PET Image Restag (PS) Skull Base To Thigh    Standing Status:   Future    Standing Expiration Date:   06/02/2024    Order Specific Question:   If indicated for the ordered procedure, I authorize the administration of a radiopharmaceutical per Radiology protocol    Answer:   Yes    Order Specific Question:   Preferred imaging location?    Answer:   [facility]    Order Specific Question:   Release to patient    Answer:   Immediate   All questions were answered. The patient knows to call the clinic with any problems, questions or concerns. No barriers to learning was detected. The total time spent in the appointment was 30 minutes.     Malachy Mood, MD 06/03/2023   Carolin Coy, CMA, am acting as scribe for Malachy Mood, MD.   I have reviewed the above documentation for accuracy and completeness, and I agree with the above.

## 2023-06-04 ENCOUNTER — Encounter: Payer: Self-pay | Admitting: Hematology

## 2023-06-04 NOTE — Telephone Encounter (Signed)
Error

## 2023-07-01 ENCOUNTER — Inpatient Hospital Stay: Payer: Medicare FFS | Attending: Hematology

## 2023-07-01 ENCOUNTER — Other Ambulatory Visit: Payer: Self-pay

## 2023-07-01 VITALS — BP 158/57 | HR 69 | Temp 97.7°F | Resp 18

## 2023-07-01 DIAGNOSIS — Z170 Estrogen receptor positive status [ER+]: Secondary | ICD-10-CM | POA: Diagnosis not present

## 2023-07-01 DIAGNOSIS — C7951 Secondary malignant neoplasm of bone: Secondary | ICD-10-CM | POA: Diagnosis not present

## 2023-07-01 DIAGNOSIS — Z5111 Encounter for antineoplastic chemotherapy: Secondary | ICD-10-CM | POA: Insufficient documentation

## 2023-07-01 DIAGNOSIS — C50212 Malignant neoplasm of upper-inner quadrant of left female breast: Secondary | ICD-10-CM | POA: Diagnosis not present

## 2023-07-01 MED ORDER — FULVESTRANT 250 MG/5ML IM SOSY
500.0000 mg | PREFILLED_SYRINGE | Freq: Once | INTRAMUSCULAR | Status: AC
  Administered 2023-07-01: 500 mg via INTRAMUSCULAR
  Filled 2023-07-01: qty 10

## 2023-08-02 ENCOUNTER — Inpatient Hospital Stay: Payer: Medicare FFS | Attending: Hematology

## 2023-08-02 VITALS — BP 166/53 | HR 80 | Temp 97.8°F | Resp 18

## 2023-08-02 DIAGNOSIS — Z170 Estrogen receptor positive status [ER+]: Secondary | ICD-10-CM | POA: Insufficient documentation

## 2023-08-02 DIAGNOSIS — Z5111 Encounter for antineoplastic chemotherapy: Secondary | ICD-10-CM | POA: Diagnosis present

## 2023-08-02 DIAGNOSIS — C50212 Malignant neoplasm of upper-inner quadrant of left female breast: Secondary | ICD-10-CM | POA: Diagnosis not present

## 2023-08-02 DIAGNOSIS — C7951 Secondary malignant neoplasm of bone: Secondary | ICD-10-CM | POA: Insufficient documentation

## 2023-08-02 MED ORDER — FULVESTRANT 250 MG/5ML IM SOSY
500.0000 mg | PREFILLED_SYRINGE | Freq: Once | INTRAMUSCULAR | Status: AC
  Administered 2023-08-02: 500 mg via INTRAMUSCULAR
  Filled 2023-08-02: qty 10

## 2023-08-16 ENCOUNTER — Other Ambulatory Visit: Payer: Self-pay

## 2023-08-26 ENCOUNTER — Ambulatory Visit: Payer: Medicare FFS

## 2023-09-02 ENCOUNTER — Inpatient Hospital Stay: Payer: Medicare FFS

## 2023-09-02 ENCOUNTER — Inpatient Hospital Stay: Payer: Medicare FFS | Attending: Hematology

## 2023-09-02 VITALS — BP 159/55 | HR 55 | Temp 98.2°F | Resp 18

## 2023-09-02 DIAGNOSIS — Z5111 Encounter for antineoplastic chemotherapy: Secondary | ICD-10-CM | POA: Diagnosis present

## 2023-09-02 DIAGNOSIS — C50212 Malignant neoplasm of upper-inner quadrant of left female breast: Secondary | ICD-10-CM | POA: Insufficient documentation

## 2023-09-02 DIAGNOSIS — Z170 Estrogen receptor positive status [ER+]: Secondary | ICD-10-CM | POA: Diagnosis not present

## 2023-09-02 DIAGNOSIS — C7951 Secondary malignant neoplasm of bone: Secondary | ICD-10-CM | POA: Insufficient documentation

## 2023-09-02 LAB — CBC WITH DIFFERENTIAL/PLATELET
Abs Immature Granulocytes: 0.02 10*3/uL (ref 0.00–0.07)
Basophils Absolute: 0 10*3/uL (ref 0.0–0.1)
Basophils Relative: 2 %
Eosinophils Absolute: 0.1 10*3/uL (ref 0.0–0.5)
Eosinophils Relative: 4 %
HCT: 25.7 % — ABNORMAL LOW (ref 36.0–46.0)
Hemoglobin: 8.8 g/dL — ABNORMAL LOW (ref 12.0–15.0)
Immature Granulocytes: 1 %
Lymphocytes Relative: 37 %
Lymphs Abs: 0.7 10*3/uL (ref 0.7–4.0)
MCH: 43.3 pg — ABNORMAL HIGH (ref 26.0–34.0)
MCHC: 34.2 g/dL (ref 30.0–36.0)
MCV: 126.6 fL — ABNORMAL HIGH (ref 80.0–100.0)
Monocytes Absolute: 0.1 10*3/uL (ref 0.1–1.0)
Monocytes Relative: 7 %
Neutro Abs: 1 10*3/uL — ABNORMAL LOW (ref 1.7–7.7)
Neutrophils Relative %: 49 %
Platelets: 143 10*3/uL — ABNORMAL LOW (ref 150–400)
RBC: 2.03 MIL/uL — ABNORMAL LOW (ref 3.87–5.11)
RDW: 13.5 % (ref 11.5–15.5)
Smear Review: NORMAL
WBC: 2 10*3/uL — ABNORMAL LOW (ref 4.0–10.5)
nRBC: 0 % (ref 0.0–0.2)

## 2023-09-02 LAB — COMPREHENSIVE METABOLIC PANEL
ALT: 11 U/L (ref 0–44)
AST: 18 U/L (ref 15–41)
Albumin: 3.6 g/dL (ref 3.5–5.0)
Alkaline Phosphatase: 126 U/L (ref 38–126)
Anion gap: 7 (ref 5–15)
BUN: 37 mg/dL — ABNORMAL HIGH (ref 8–23)
CO2: 23 mmol/L (ref 22–32)
Calcium: 9 mg/dL (ref 8.9–10.3)
Chloride: 112 mmol/L — ABNORMAL HIGH (ref 98–111)
Creatinine, Ser: 1.95 mg/dL — ABNORMAL HIGH (ref 0.44–1.00)
GFR, Estimated: 25 mL/min — ABNORMAL LOW (ref >60)
Glucose, Bld: 95 mg/dL (ref 70–99)
Potassium: 4.3 mmol/L (ref 3.5–5.1)
Sodium: 142 mmol/L (ref 135–145)
Total Bilirubin: 0.4 mg/dL (ref 0.3–1.2)
Total Protein: 6.5 g/dL (ref 6.5–8.1)

## 2023-09-02 MED ORDER — FULVESTRANT 250 MG/5ML IM SOSY
500.0000 mg | PREFILLED_SYRINGE | Freq: Once | INTRAMUSCULAR | Status: AC
  Administered 2023-09-02: 500 mg via INTRAMUSCULAR
  Filled 2023-09-02: qty 10

## 2023-09-02 NOTE — Patient Instructions (Signed)

## 2023-09-04 ENCOUNTER — Telehealth: Payer: Self-pay | Admitting: Hematology

## 2023-09-09 ENCOUNTER — Ambulatory Visit: Payer: Medicare FFS | Admitting: Hematology

## 2023-09-19 ENCOUNTER — Encounter: Payer: Self-pay | Admitting: Internal Medicine

## 2023-09-19 NOTE — Patient Instructions (Addendum)
Consider getting COVID booster and tetanus vaccine at the pharmacy.   Pneumonia vaccine today.    Medications changes include :   stop lisinopril.    Start nebivolol 10 mg daily.  Continue amlodipine 2.5 mg       Return in about 6 months (around 03/20/2024) for follow up.   Health Maintenance, Female Adopting a healthy lifestyle and getting preventive care are important in promoting health and wellness. Ask your health care provider about: The right schedule for you to have regular tests and exams. Things you can do on your own to prevent diseases and keep yourself healthy. What should I know about diet, weight, and exercise? Eat a healthy diet  Eat a diet that includes plenty of vegetables, fruits, low-fat dairy products, and lean protein. Do not eat a lot of foods that are high in solid fats, added sugars, or sodium. Maintain a healthy weight Body mass index (BMI) is used to identify weight problems. It estimates body fat based on height and weight. Your health care provider can help determine your BMI and help you achieve or maintain a healthy weight. Get regular exercise Get regular exercise. This is one of the most important things you can do for your health. Most adults should: Exercise for at least 150 minutes each week. The exercise should increase your heart rate and make you sweat (moderate-intensity exercise). Do strengthening exercises at least twice a week. This is in addition to the moderate-intensity exercise. Spend less time sitting. Even light physical activity can be beneficial. Watch cholesterol and blood lipids Have your blood tested for lipids and cholesterol at 83 years of age, then have this test every 5 years. Have your cholesterol levels checked more often if: Your lipid or cholesterol levels are high. You are older than 83 years of age. You are at high risk for heart disease. What should I know about cancer screening? Depending on your health  history and family history, you may need to have cancer screening at various ages. This may include screening for: Breast cancer. Cervical cancer. Colorectal cancer. Skin cancer. Lung cancer. What should I know about heart disease, diabetes, and high blood pressure? Blood pressure and heart disease High blood pressure causes heart disease and increases the risk of stroke. This is more likely to develop in people who have high blood pressure readings or are overweight. Have your blood pressure checked: Every 3-5 years if you are 69-87 years of age. Every year if you are 26 years old or older. Diabetes Have regular diabetes screenings. This checks your fasting blood sugar level. Have the screening done: Once every three years after age 53 if you are at a normal weight and have a low risk for diabetes. More often and at a younger age if you are overweight or have a high risk for diabetes. What should I know about preventing infection? Hepatitis B If you have a higher risk for hepatitis B, you should be screened for this virus. Talk with your health care provider to find out if you are at risk for hepatitis B infection. Hepatitis C Testing is recommended for: Everyone born from [birth year] through 1966. Anyone with known risk factors for hepatitis C. Sexually transmitted infections (STIs) Get screened for STIs, including gonorrhea and chlamydia, if: You are sexually active and are younger than 83 years of age. You are older than 83 years of age and your health care provider tells you that you are at risk for this type  of infection. Your sexual activity has changed since you were last screened, and you are at increased risk for chlamydia or gonorrhea. Ask your health care provider if you are at risk. Ask your health care provider about whether you are at high risk for HIV. Your health care provider may recommend a prescription medicine to help prevent HIV infection. If you choose to take medicine to  prevent HIV, you should first get tested for HIV. You should then be tested every 3 months for as long as you are taking the medicine. Pregnancy If you are about to stop having your period (premenopausal) and you may become pregnant, seek counseling before you get pregnant. Take 400 to 800 micrograms (mcg) of folic acid every day if you become pregnant. Ask for birth control (contraception) if you want to prevent pregnancy. Osteoporosis and menopause Osteoporosis is a disease in which the bones lose minerals and strength with aging. This can result in bone fractures. If you are 45 years old or older, or if you are at risk for osteoporosis and fractures, ask your health care provider if you should: Be screened for bone loss. Take a calcium or vitamin D supplement to lower your risk of fractures. Be given hormone replacement therapy (HRT) to treat symptoms of menopause. Follow these instructions at home: Alcohol use Do not drink alcohol if: Your health care provider tells you not to drink. You are pregnant, may be pregnant, or are planning to become pregnant. If you drink alcohol: Limit how much you have to: 0-1 drink a day. Know how much alcohol is in your drink. In the [place] one drink equals one 12 oz bottle of beer (355 mL), one 5 oz glass of wine (148 mL), or one 1 oz glass of hard liquor (44 mL). Lifestyle Do not use any products that contain nicotine or tobacco. These products include cigarettes, chewing tobacco, and vaping devices, such as e-cigarettes. If you need help quitting, ask your health care provider. Do not use street drugs. Do not share needles. Ask your health care provider for help if you need support or information about quitting drugs. General instructions Schedule regular health, dental, and eye exams. Stay current with your vaccines. Tell your health care provider if: You often feel depressed. You have ever been abused or do not feel safe at  home. Summary Adopting a healthy lifestyle and getting preventive care are important in promoting health and wellness. Follow your health care provider's instructions about healthy diet, exercising, and getting tested or screened for diseases. Follow your health care provider's instructions on monitoring your cholesterol and blood pressure. This information is not intended to replace advice given to you by your health care provider. Make sure you discuss any questions you have with your health care provider. Document Revised: 04/03/2021 Document Reviewed: 04/03/2021 Elsevier Patient Education  2024 [employer].

## 2023-09-19 NOTE — Progress Notes (Signed)
Subjective:    Patient ID: Shirley Wells, female    DOB: 16-Jun-1941, 82 y.o.   MRN: [id number]      HPI Shirley Wells is here for a Physical exam and her chronic medical problems.    No concerns.  Goes out to the stores but does not go out much.  Not checking BP at home.   Medications and allergies reviewed with patient and updated if appropriate.  Current Outpatient Medications on File Prior to Visit  Medication Sig Dispense Refill   acetaminophen (TYLENOL) 650 MG CR tablet Take 1,300 mg by mouth every 8 (eight) hours as needed for pain.     amLODipine (NORVASC) 2.5 MG tablet Take 1 tablet (2.5 mg total) by mouth daily. 90 tablet 3   aspirin 81 MG tablet Take 81 mg by mouth 2 (two) times a week.     furosemide (LASIX) 20 MG tablet Take 20 mg by mouth daily as needed for fluid.     loratadine (CLARITIN) 10 MG tablet Take 10 mg by mouth daily as needed for allergies.     Menthol, Topical Analgesic, (BIOFREEZE EX) Apply 1 application topically daily as needed (muscle pain).     milk thistle 175 MG tablet Take 175 mg by mouth daily.     Multiple Vitamin (MULTIVITAMIN) capsule Take 1 capsule by mouth daily.     palbociclib (IBRANCE) 100 MG tablet Take 1 tablet (100 mg total) by mouth daily. Take days 1-14 and 7 days off for a  21 day cycle 21 tablet 2   No current facility-administered medications on file prior to visit.    Review of Systems  Constitutional:  Negative for fever.  Eyes:  Negative for visual disturbance.  Respiratory:  Negative for cough, shortness of breath and wheezing.   Cardiovascular:  Positive for leg swelling. Negative for chest pain and palpitations.  Gastrointestinal:  Negative for abdominal pain, blood in stool, constipation and diarrhea.       Rare gerd  Genitourinary:  Negative for dysuria.  Musculoskeletal:  Positive for back pain. Negative for arthralgias.  Skin:  Negative for rash.  Neurological:  Negative for light-headedness and headaches.   Hematological:  Bruises/bleeds easily (bruises easily).  Psychiatric/Behavioral:  Negative for dysphoric mood and sleep disturbance. The patient is not nervous/anxious.        Objective:   Vitals:   09/20/23 1320  BP: (!) 144/80  Pulse: 80  Temp: 98 F (36.7 C)  SpO2: 97%   Filed Weights   09/20/23 1320  Weight: 143 lb (64.9 kg)   Body mass index is 26.16 kg/m.  BP Readings from Last 3 Encounters:  09/20/23 (!) 144/80  09/02/23 (!) 159/55  08/02/23 (!) 166/53    Wt Readings from Last 3 Encounters:  09/20/23 143 lb (64.9 kg)  06/03/23 142 lb 9.6 oz (64.7 kg)  04/01/23 144 lb 9.6 oz (65.6 kg)       Physical Exam Constitutional: She appears well-developed and well-nourished. No distress.  HENT:  Head: Normocephalic and atraumatic.  Right Ear: External ear normal. Normal ear canal and TM Left Ear: External ear normal.  Normal ear canal and TM Mouth/Throat: Oropharynx is clear and moist.  Eyes: Conjunctivae normal.  Neck: Neck supple. No tracheal deviation present. No thyromegaly present.  No carotid bruit  Cardiovascular: Normal rate, regular rhythm and normal heart sounds.   2/6 sys murmur heard.  Mild b/l LE edema. Pulmonary/Chest: Effort normal and breath sounds normal. No respiratory distress.  She has no wheezes. She has no rales.  Breast: deferred   Abdominal: Soft. She exhibits no distension. There is no tenderness.  Lymphadenopathy: She has no cervical adenopathy.  Skin: Skin is warm and dry. She is not diaphoretic.  Psychiatric: She has a normal mood and affect. Her behavior is normal.     Lab Results  Component Value Date   WBC 2.0 (L) 09/02/2023   HGB 8.8 (L) 09/02/2023   HCT 25.7 (L) 09/02/2023   PLT 143 (L) 09/02/2023   GLUCOSE 95 09/02/2023   CHOL 304 (H) 09/12/2020   TRIG (H) 09/12/2020    462.0 Triglyceride is over 400; calculations on Lipids are invalid.   HDL 47.70 09/12/2020   LDLDIRECT 58.0 09/12/2020   LDLCALC 232 (H) 04/30/2014    ALT 11 09/02/2023   AST 18 09/02/2023   NA 142 09/02/2023   K 4.3 09/02/2023   CL 112 (H) 09/02/2023   CREATININE 1.95 (H) 09/02/2023   BUN 37 (H) 09/02/2023   CO2 23 09/02/2023   TSH 1.67 11/02/2016   INR 1.1 12/09/2020   HGBA1C 5.1 03/19/2022   HGBA1C 5.1 03/19/2022   HGBA1C 5.1 (A) 03/19/2022   HGBA1C 5.1 03/19/2022         Assessment & Plan:   Physical exam: Screening blood work deferred Exercise  none Weight  good Substance abuse  none   Reviewed recommended immunizations.  Prevnar today   Health Maintenance  Topic Date Due   DEXA SCAN  09/22/2023 (Originally 09/12/2022)   COVID-19 Vaccine (3 - Pfizer risk series) 10/06/2023 (Originally 11/25/2020)   DTaP/Tdap/Td (1 - Tdap) 09/09/2024 (Originally 07/12/1960)   Medicare Annual Wellness (AWV)  01/17/2024   HPV VACCINES  Aged Out   Pneumonia Vaccine 29+ Years old  Discontinued   Zoster Vaccines- Shingrix  Discontinued          See Problem List for Assessment and Plan of chronic medical problems.

## 2023-09-20 ENCOUNTER — Ambulatory Visit (INDEPENDENT_AMBULATORY_CARE_PROVIDER_SITE_OTHER): Payer: Medicare FFS | Admitting: Internal Medicine

## 2023-09-20 ENCOUNTER — Telehealth: Payer: Self-pay

## 2023-09-20 ENCOUNTER — Telehealth: Payer: Self-pay | Admitting: Hematology

## 2023-09-20 ENCOUNTER — Ambulatory Visit: Payer: Medicare FFS | Admitting: Hematology

## 2023-09-20 VITALS — BP 144/80 | HR 80 | Temp 98.0°F | Ht 62.0 in | Wt 143.0 lb

## 2023-09-20 DIAGNOSIS — I10 Essential (primary) hypertension: Secondary | ICD-10-CM | POA: Diagnosis not present

## 2023-09-20 DIAGNOSIS — Z8673 Personal history of transient ischemic attack (TIA), and cerebral infarction without residual deficits: Secondary | ICD-10-CM | POA: Diagnosis not present

## 2023-09-20 DIAGNOSIS — E785 Hyperlipidemia, unspecified: Secondary | ICD-10-CM | POA: Diagnosis not present

## 2023-09-20 DIAGNOSIS — Z23 Encounter for immunization: Secondary | ICD-10-CM | POA: Diagnosis not present

## 2023-09-20 DIAGNOSIS — N1832 Chronic kidney disease, stage 3b: Secondary | ICD-10-CM | POA: Diagnosis not present

## 2023-09-20 DIAGNOSIS — Z0000 Encounter for general adult medical examination without abnormal findings: Secondary | ICD-10-CM | POA: Diagnosis not present

## 2023-09-20 DIAGNOSIS — R7303 Prediabetes: Secondary | ICD-10-CM

## 2023-09-20 MED ORDER — NEBIVOLOL HCL 10 MG PO TABS: 10.0000 mg | ORAL_TABLET | Freq: Every day | ORAL | 3 refills | Status: DC

## 2023-09-20 MED ORDER — CLOPIDOGREL BISULFATE 75 MG PO TABS: ORAL_TABLET | ORAL | 1 refills | Status: DC

## 2023-09-20 NOTE — Telephone Encounter (Signed)
Spoke w/Shirley Wells to inform Shirley Wells that her appt scheduled for today w/Dr. Mosetta Putt is cancelled.  Shirley Wells sounded extremely confused; therefore, contacted Shirley Wells's daughter.  LVM for Shirley Wells's daughter stating that Dr. Mosetta Putt cancelled Shirley Wells's appt today d/t Shirley Wells was supposed to complete a PET Scan in October and Dr. Mosetta Putt would like for the Shirley Wells to complete the PET Scan.  Provided the number to Central Scheduling for the scan to be scheduled.  Stated Dr. Mosetta Putt will schedule the f/u appt 2 to 3 days after the PET Scan is completed to go over the results of the scan.  Instructed Shirley Wells's daughter to call Dr. Latanya Maudlin office should she have additional questions or concerns.

## 2023-09-20 NOTE — Assessment & Plan Note (Signed)
Chronic Lab Results  Component Value Date   HGBA1C 5.1 03/19/2022   HGBA1C 5.1 03/19/2022   HGBA1C 5.1 (A) 03/19/2022   HGBA1C 5.1 03/19/2022

## 2023-09-20 NOTE — Assessment & Plan Note (Signed)
Chronic She has deferred statin therapy Encouraged healthy diet

## 2023-09-20 NOTE — Assessment & Plan Note (Addendum)
Chronic H/o cva Continue ASA 81 mg daily, plavix 75 mg daily, lisinopril 10 mg daily BP not ideally controlled-will adjust medication-advised monitoring BP at home Deferred statin a1c normal

## 2023-09-20 NOTE — Assessment & Plan Note (Addendum)
Chronic Progression over the years, but stable over the last year BP adequately controlled Continue amlodipine 2.5 mg daily Has not wanted to discontinue lisinopril, but now is willing to do so-will discontinue secondary to low GFR

## 2023-09-20 NOTE — Assessment & Plan Note (Deleted)
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 S/p left total mastectomy. She declined antiestrogen therapy due to concerns of side effects. She lost follow up after 07/2017.   -After 6 months of left hip and ribcage pain she was found to have diffuse bone metastasis on 11/28/20 PET. This was confirmed on 12/09/20 bone biopsy --she started first-line Anastrozole in 11/2020 and Ibrance on 01/09/21, not very compliant with Ibrance  -progressed on PET 10/03/2022 -changed her tx to fulvestrant injection and Verzenio. Due to GI side effect, I changed her Verzenio to [place] -Restaging PET scan from January 28, 2023 showed overall stable disease, with some lesion mildly improved -Will continue fulvestrant and Ibrance -She is clinically doing well, lab reviewed, exam unremarkable, no clinical concern for cancer recurrence -Plan to repeat CT or PET scan in [facility] 2024

## 2023-09-20 NOTE — Assessment & Plan Note (Addendum)
Chronic BP not controlled She is willing to make changes today Continue amlodipine 2.5 mg daily Stop lisinopril givnan GFR and start bystolic 10 mg daily Monitor BP

## 2023-09-30 ENCOUNTER — Inpatient Hospital Stay: Payer: Medicare FFS | Attending: Hematology

## 2023-09-30 VITALS — BP 149/5 | HR 51 | Temp 98.1°F | Resp 16

## 2023-09-30 DIAGNOSIS — Z170 Estrogen receptor positive status [ER+]: Secondary | ICD-10-CM | POA: Diagnosis not present

## 2023-09-30 DIAGNOSIS — C7951 Secondary malignant neoplasm of bone: Secondary | ICD-10-CM | POA: Diagnosis not present

## 2023-09-30 DIAGNOSIS — C50212 Malignant neoplasm of upper-inner quadrant of left female breast: Secondary | ICD-10-CM | POA: Diagnosis not present

## 2023-09-30 DIAGNOSIS — Z5111 Encounter for antineoplastic chemotherapy: Secondary | ICD-10-CM | POA: Insufficient documentation

## 2023-09-30 MED ORDER — FULVESTRANT 250 MG/5ML IM SOSY
500.0000 mg | PREFILLED_SYRINGE | Freq: Once | INTRAMUSCULAR | Status: AC
  Administered 2023-09-30: 500 mg via INTRAMUSCULAR
  Filled 2023-09-30: qty 10

## 2023-10-14 ENCOUNTER — Ambulatory Visit (HOSPITAL_COMMUNITY)
Admission: RE | Admit: 2023-10-14 | Discharge: 2023-10-14 | Disposition: A | Discharge status: Home / Self Care | Payer: Medicare FFS | Source: Ambulatory Visit | Attending: Hematology | Admitting: Hematology

## 2023-10-14 DIAGNOSIS — Z170 Estrogen receptor positive status [ER+]: Secondary | ICD-10-CM | POA: Insufficient documentation

## 2023-10-14 DIAGNOSIS — C50212 Malignant neoplasm of upper-inner quadrant of left female breast: Secondary | ICD-10-CM | POA: Insufficient documentation

## 2023-10-14 LAB — GLUCOSE, CAPILLARY: Glucose-Capillary: 90 mg/dL (ref 70–99)

## 2023-10-14 MED ORDER — FLUDEOXYGLUCOSE F - 18 (FDG) INJECTION
7.1400 | Freq: Once | INTRAVENOUS | Status: AC
  Administered 2023-10-14: 7.14 via INTRAVENOUS

## 2023-10-17 ENCOUNTER — Other Ambulatory Visit: Payer: Self-pay

## 2023-10-18 ENCOUNTER — Telehealth: Payer: Self-pay | Admitting: Pharmacy Technician

## 2023-10-18 NOTE — Telephone Encounter (Signed)
Oral Oncology Patient Advocate Encounter   Received notification that patient is due for re-enrollment for assistance for Ibrance through [employer] .   Re-enrollment process has been initiated and will be submitted upon completion of necessary documents.   [employer] [facility] phone number [phone].   I will continue to follow until final determination.  Jinger Neighbors, CPhT-Adv Oncology Pharmacy Patient Advocate [facility] Direct Number: [phone]  Fax: [phone]

## 2023-10-21 ENCOUNTER — Other Ambulatory Visit (HOSPITAL_COMMUNITY): Payer: Self-pay

## 2023-10-21 NOTE — Telephone Encounter (Signed)
Oral Oncology Patient Advocate Encounter   Received notification that prior authorization for Shirley Wells is required.   PA submitted on 10/21/23 Key [id number] Status is pending     Jinger Neighbors, CPhT-Adv Oncology Pharmacy Patient Advocate [facility] Direct Number: [phone]  Fax: [phone]

## 2023-10-21 NOTE — Telephone Encounter (Signed)
Oral Oncology Patient Advocate Encounter  Prior Authorization for Ilda Foil has been approved.    PA# [id number] Effective dates: 10/21/23 through 04/18/24  Patients co-pay is $3,190.72.    Jinger Neighbors, CPhT-Adv Oncology Pharmacy Patient Advocate [facility] Direct Number: [phone]  Fax: [phone]

## 2023-10-26 ENCOUNTER — Other Ambulatory Visit: Payer: Self-pay | Admitting: Hematology

## 2023-10-26 DIAGNOSIS — C50212 Malignant neoplasm of upper-inner quadrant of left female breast: Secondary | ICD-10-CM

## 2023-10-26 NOTE — Assessment & Plan Note (Signed)
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 S/p left total mastectomy. She declined antiestrogen therapy due to concerns of side effects. She lost follow up after 07/2017.   -After 6 months of left hip and ribcage pain she was found to have diffuse bone metastasis on 11/28/20 PET. This was confirmed on 12/09/20 bone biopsy --she started first-line Anastrozole in 11/2020 and Ibrance on 01/09/21, not very compliant with Ibrance  -progressed on PET 10/03/2022 -changed her tx to fulvestrant injection and Verzenio. Due to GI side effect, I changed her Verzenio to [place] -Restaging PET scan from January 28, 2023 showed overall stable disease, with some lesion mildly improved -Will continue fulvestrant and Ibrance -restaging PET on 11/18 showed worsening bone metastasis

## 2023-10-28 ENCOUNTER — Inpatient Hospital Stay: Payer: Medicare FFS

## 2023-10-28 ENCOUNTER — Telehealth: Payer: Self-pay | Admitting: Internal Medicine

## 2023-10-28 ENCOUNTER — Inpatient Hospital Stay: Payer: Medicare FFS | Attending: Hematology | Admitting: Hematology

## 2023-10-28 ENCOUNTER — Encounter: Payer: Self-pay | Admitting: Hematology

## 2023-10-28 VITALS — BP 170/68 | HR 40 | Temp 97.6°F | Resp 16 | Ht 62.0 in | Wt 146.4 lb

## 2023-10-28 DIAGNOSIS — I959 Hypotension, unspecified: Secondary | ICD-10-CM | POA: Diagnosis not present

## 2023-10-28 DIAGNOSIS — C50212 Malignant neoplasm of upper-inner quadrant of left female breast: Secondary | ICD-10-CM

## 2023-10-28 DIAGNOSIS — R001 Bradycardia, unspecified: Secondary | ICD-10-CM | POA: Diagnosis not present

## 2023-10-28 DIAGNOSIS — Z170 Estrogen receptor positive status [ER+]: Secondary | ICD-10-CM | POA: Insufficient documentation

## 2023-10-28 DIAGNOSIS — Z9012 Acquired absence of left breast and nipple: Secondary | ICD-10-CM | POA: Diagnosis not present

## 2023-10-28 DIAGNOSIS — Z5111 Encounter for antineoplastic chemotherapy: Secondary | ICD-10-CM | POA: Insufficient documentation

## 2023-10-28 DIAGNOSIS — D649 Anemia, unspecified: Secondary | ICD-10-CM | POA: Insufficient documentation

## 2023-10-28 DIAGNOSIS — I10 Essential (primary) hypertension: Secondary | ICD-10-CM | POA: Diagnosis not present

## 2023-10-28 DIAGNOSIS — C7951 Secondary malignant neoplasm of bone: Secondary | ICD-10-CM | POA: Insufficient documentation

## 2023-10-28 DIAGNOSIS — Z1721 Progesterone receptor positive status: Secondary | ICD-10-CM | POA: Insufficient documentation

## 2023-10-28 LAB — COMPREHENSIVE METABOLIC PANEL
ALT: 11 U/L (ref 0–44)
AST: 22 U/L (ref 15–41)
Albumin: 3.9 g/dL (ref 3.5–5.0)
Alkaline Phosphatase: 162 U/L — ABNORMAL HIGH (ref 38–126)
Anion gap: 9 (ref 5–15)
BUN: 34 mg/dL — ABNORMAL HIGH (ref 8–23)
CO2: 24 mmol/L (ref 22–32)
Calcium: 9.7 mg/dL (ref 8.9–10.3)
Chloride: 112 mmol/L — ABNORMAL HIGH (ref 98–111)
Creatinine, Ser: 1.8 mg/dL — ABNORMAL HIGH (ref 0.44–1.00)
GFR, Estimated: 28 mL/min — ABNORMAL LOW (ref >60)
Glucose, Bld: 101 mg/dL — ABNORMAL HIGH (ref 70–99)
Potassium: 4.1 mmol/L (ref 3.5–5.1)
Sodium: 145 mmol/L (ref 135–145)
Total Bilirubin: 0.4 mg/dL (ref <1.2)
Total Protein: 7.1 g/dL (ref 6.5–8.1)

## 2023-10-28 LAB — FERRITIN: Ferritin: 205 ng/mL (ref 11–307)

## 2023-10-28 LAB — CBC WITH DIFFERENTIAL/PLATELET
Abs Immature Granulocytes: 0.01 10*3/uL (ref 0.00–0.07)
Basophils Absolute: 0 10*3/uL (ref 0.0–0.1)
Basophils Relative: 1 %
Eosinophils Absolute: 0.1 10*3/uL (ref 0.0–0.5)
Eosinophils Relative: 2 %
HCT: 28.6 % — ABNORMAL LOW (ref 36.0–46.0)
Hemoglobin: 9.7 g/dL — ABNORMAL LOW (ref 12.0–15.0)
Immature Granulocytes: 0 %
Lymphocytes Relative: 45 %
Lymphs Abs: 1.2 10*3/uL (ref 0.7–4.0)
MCH: 42.5 pg — ABNORMAL HIGH (ref 26.0–34.0)
MCHC: 33.9 g/dL (ref 30.0–36.0)
MCV: 125.4 fL — ABNORMAL HIGH (ref 80.0–100.0)
Monocytes Absolute: 0.3 10*3/uL (ref 0.1–1.0)
Monocytes Relative: 9 %
Neutro Abs: 1.2 10*3/uL — ABNORMAL LOW (ref 1.7–7.7)
Neutrophils Relative %: 43 %
Platelets: 89 10*3/uL — ABNORMAL LOW (ref 150–400)
RBC: 2.28 MIL/uL — ABNORMAL LOW (ref 3.87–5.11)
RDW: 14.5 % (ref 11.5–15.5)
Smear Review: NORMAL
WBC: 2.8 10*3/uL — ABNORMAL LOW (ref 4.0–10.5)
nRBC: 0 % (ref 0.0–0.2)

## 2023-10-28 LAB — SAMPLE TO BLOOD BANK

## 2023-10-28 LAB — RETIC PANEL
Immature Retic Fract: 31.4 % — ABNORMAL HIGH (ref 2.3–15.9)
RBC.: 2.22 MIL/uL — ABNORMAL LOW (ref 3.87–5.11)
Retic Count, Absolute: 35.3 10*3/uL (ref 19.0–186.0)
Retic Ct Pct: 1.6 % (ref 0.4–3.1)
Reticulocyte Hemoglobin: 39.1 pg (ref >27.9)

## 2023-10-28 LAB — VITAMIN B12: Vitamin B-12: 318 pg/mL (ref 180–914)

## 2023-10-28 MED ORDER — HYDRALAZINE HCL 25 MG PO TABS: 25.0000 mg | ORAL_TABLET | Freq: Three times a day (TID) | ORAL | 5 refills | Status: DC

## 2023-10-28 MED ORDER — FULVESTRANT 250 MG/5ML IM SOSY
500.0000 mg | PREFILLED_SYRINGE | Freq: Once | INTRAMUSCULAR | Status: AC
Start: 2023-10-28 — End: 2023-10-28
  Administered 2023-10-28: 500 mg via INTRAMUSCULAR
  Filled 2023-10-28: qty 10

## 2023-10-28 NOTE — Telephone Encounter (Signed)
Please call patient - she needs to monitor her BP and heart rate at home and ideally call with her numbers in one week.      Start hydralazine 25 mg three times daily.  We will adjust your dose as needed.  ( Rx sent to pharmacy).  Bystolic stopped by Dr Mosetta Putt.     Received message from Dr Mosetta Putt today  --    Dr. Lawerance Bach,  I saw her today for routine f/u. Noted her HR in 40's, EKG showed bradycardia, she was asymptomatic.  Her blood pressure was also very high with SBP 170-200.  I told her to hold Bystolic for now and you will call her to adjust her BP meds.  Thanks  Terrace Arabia

## 2023-10-28 NOTE — Progress Notes (Signed)
[facility]   Telephone:([phone]) [phone] Fax:(336) [phone]   Clinic Follow up Note   Patient Care Team: Pincus Sanes, MD as PCP - General (Internal Medicine) Micki Riley, MD as Consulting Physician (Neurology) Emelia Loron, MD as Consulting Physician (General Surgery) Malachy Mood, MD as Consulting Physician (Hematology) Dorothy Puffer, MD as Consulting Physician (Radiation Oncology)  Date of Service:  10/28/2023  CHIEF COMPLAINT: f/u of metastatic breast cancer  CURRENT THERAPY:  Second line fulvestrant injection and Ibrance  Oncology History   Malignant neoplasm of upper-inner quadrant of left breast in female, estrogen receptor positive (HCC) -pT2N0, Stage 1b, ER/PR+/HER2-, Grade 2, 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 S/p left total mastectomy. She declined antiestrogen therapy due to concerns of side effects. She lost follow up after 07/2017.   -After 6 months of left hip and ribcage pain she was found to have diffuse bone metastasis on 11/28/20 PET. This was confirmed on 12/09/20 bone biopsy --she started first-line Anastrozole in 11/2020 and Ibrance on 01/09/21, not very compliant with Ibrance  -progressed on PET 10/03/2022 -changed her tx to fulvestrant injection and Verzenio. Due to GI side effect, I changed her Verzenio to [place] -Restaging PET scan from January 28, 2023 showed overall stable disease, with some lesion mildly improved -Will continue fulvestrant and Ibrance -restaging PET on 11/18 showed worsening bone metastasis     Assessment and Plan    Metastatic Breast Cancer   Follow-up for metastatic breast cancer. Reports no new pain but occasional low energy. Recent PET scan shows progression of bone metastasis in the spine and pelvis compared to March. I reviewed with pt. Current treatment with Fulvestrant is no longer effective. On Ibrance, which will be discontinued after  the current pack. A liquid biopsy (Guardant 360) was done today to identify potential mutations for targeted therapy. If no actionable mutations are found, the next line of treatment will be Exemestane and Afinitor, or oral chemotherapy with Xeloda. Explained that Xeloda is generally well-tolerated with mild side effects such as fatigue, low appetite, and diarrhea.   - Order CBC, CMP, iron level, and B12 level   - Send extra tube to blood bank for potential blood transfusion   - Administer Fulvestrant injection today  (last dose) - Discontinue Ibrance after current pack in 7 days    - Await Guardant 360 results and schedule phone visit in 10-14 days to discuss new treatment options   - If Guardant 360 results are negative, start Exemestane and Afinitor   - Consider Xeloda if endocrine therapy options are exhausted    Anemia   Previous hemoglobin was 8.8 two months ago, indicating anemia. Has not been taking her multivitamin consistently.   - Order CBC to reassess anemia   - Encourage consistent use of multivitamin    Bradycardia and hypotension -Noticed on vital signs today, EKG showed sinus bradycardia with heart rate of 43. -This is secondary to her recently started beta-blocker Bystolic.  Her blood pressure was also noted to be elevated today.  She is asymptomatic. -I told her to stop Bystolic and I will inform her PCP  General Health Maintenance   Managing daily activities but avoids vacuuming due to back pain. Able to cook, do laundry, and shower independently.   - Encourage continuation of daily activities as tolerated   - Avoid activities that exacerbate back pain    Plan -restaging PET reviewed, which showed disease  progression in her bone metastasis. -Will proceed to last dose fulvestrant injection today, she will finish the current bottle of Ibrance  in 7 days. - Schedule phone visit in 10-14 days to discuss Guardant 360 results   - Plan to start new treatment after Christmas,  likely in the first week of January.  -due to bradycardia, and uncontrolled hypertension, I will reach out to her PCP Dr. Lawerance Bach.    SUMMARY OF ONCOLOGIC HISTORY: Oncology History Overview Note   Cancer Staging  Malignant neoplasm of upper-inner quadrant of left breast in female, estrogen receptor positive (HCC) Staging form: Breast, AJCC 8th Edition - Clinical stage from 06/18/2017: Stage IB (cT2, cN0, cM0, G2, ER+, PR+, HER2-) - Signed by Malachy Mood, MD on 06/24/2017 Histologic grading system: 3 grade system - Pathologic stage from 07/30/2017: Stage IA (pT2, pN0, cM0, G2, ER+, PR+, HER2-, Oncotype DX score: 23) - Signed by Malachy Mood, MD on 08/23/2017 Neoadjuvant therapy: No Nuclear grade: G2 Multigene prognostic tests performed: Oncotype DX Recurrence score range: Greater than or equal to 11 Histologic grading system: 3 grade system Residual tumor (R): R0 - None Laterality: Left     Malignant neoplasm of upper-inner quadrant of left breast in female, estrogen receptor positive (HCC)  06/17/2017 Mammogram   [place] and MM diagnostic Breast Tomo Bilateral  IMPRESSION: 1. 3.4 palpable mass in the 10 o'clock position of the left breast is highly suspicious for malignancy.  2. No evidence of malignancy in the right breast.  3. Ultrasound of the left axilla shows normal sized axillary lymph nodes.   06/18/2017 Initial Biopsy   Diagnosis 06/18/17 Breast, left, needle core biopsy, 10:00 o'clock - INVASIVE MAMMARY CARCINOMA. At least G2  The malignant cells are negative for E-Cadherin, supporting a lobular phenotype.   06/18/2017 Initial Diagnosis   Malignant neoplasm of upper-inner quadrant of left breast in female, estrogen receptor positive (HCC)   06/18/2017 Receptors her2   ER 95%+, PR 25%+, both strong staining  HER2- Ki67 30%    07/30/2017 Surgery   LEFT TOTAL MASTECTOMY WITH AXILLARY SENTINEL LYMPH NODE BIOPSY by Dr. Dwain Sarna on 07/30/17   07/30/2017 Pathology Results   Diagnosis  07/30/17 1. Breast, simple mastectomy, Left Total - INVASIVE LOBULAR CARCINOMA, GRADE 2, SPANNING 3.5 CM. - LOBULAR CARCINOMA IN SITU. - INVASIVE CARCINOMA IS BROADLY 0.1 CM FROM POSTERIOR MARGIN. - PERINEURAL INVASION PRESENT. - SEE ONCOLOGY TABLE. 2. Lymph node, sentinel, biopsy, Left Axillary - ONE OF ONE LYMPH NODES NEGATIVE FOR CARCINOMA (0/1). 3. Lymph node, sentinel, biopsy, Left - ONE OF ONE LYMPH NODES NEGATIVE FOR CARCINOMA (0/1).   07/30/2017 Oncotype testing   Oncotype 07/30/17 Recurrence score is 23, an intermediate risk With a 10-year risk of recurrence at 15% with tamoxifen alone.     11/11/2020 Imaging   MRI Lumbar Spine  IMPRESSION: Diffuse osseous metastases. Chronic inferior L2 endplate deformity with mild height loss.   Likely late subacute pathologic fracture deformities involving the superior L1 endplate and right L3 lamina.   Multilevel spondylosis. Moderate to severe spinal canal and neural foraminal narrowing at the L3-4 and L4-5 levels.   Mild to moderate spinal canal and neural foraminal narrowing at the L5-S1 level.   11/28/2020 PET scan   IMPRESSION: 1. Widespread and diffuse hypermetabolic bony metastases. 2. No evidence for soft tissue metastases in the neck, chest, abdomen, or pelvis. 3.  Aortic Atherosclerois (ICD10-170.0)     11/29/2020 -  Chemotherapy   Zometa q66months starting 11/29/20. Held after first dose.  Given her CKD will only give when she has hypercalcemia and may switch to Prolia.     12/04/2020 Imaging   MRI Brain  IMPRESSION: No evidence of intracranial metastatic disease.   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   Mild chronic microvascular ischemic changes. Small chronic right cerebellar infarct.     12/07/2020 - 12/21/2020 Radiation Therapy   palliative RT to left hip with Dr Mitzi Hansen on 12/07/20-12/21/20   12/09/2020 Pathology Results   FINAL MICROSCOPIC DIAGNOSIS:   A. BONE, BIOPSY:  - Metastatic  carcinoma.  - See comment.   COMMENT:  The morphology is compatible with metastatic lobular breast carcinoma.  Estrogen receptor, progesterone receptor and HER2/neu will be performed.   ADDENDUM:   PROGNOSTIC INDICATOR RESULTS:   Immunohistochemical and morphometric analysis performed manually   The tumor cells are EQUIVOCAL for Her2 (2+).  Her2 by FISH will be performed and the results will be reported  separately.   Estrogen Receptor:       POSITIVE, 50%, MODERATE-STRONG STAINING  Progesterone Receptor:   POSITIVE, 15%, MODERATE-STRONG STAINING    11/2020 -  Anti-estrogen oral therapy   First-line Anti-estrogen therapy Anastrozole 1mg  daily starting in 11/2020 and Ibrance starting in 12/2020    01/09/2021 -  Chemotherapy   Ibrance 3 weeks on/1 weeks off starting beginning of 01/09/21. Reduced to 75mg  starting with C2 (02/13/21) due to cytopenia and fatigue. Reduced to 75mg  2 weeks on/1 week off starting C3 on 03/13/21)    03/31/2021 PET scan   IMPRESSION: 1. Dramatic reduction in metabolic activity throughout the skeleton. Metabolic activity of diffuse sclerotic skeletal lesions is now essentially at background blood pool level. No focal metabolic activity. 2. Persistent diffuse confluent sclerotic skeletal metastasis unchanged. 3. No evidence of soft tissue breast cancer metastasis.   10/13/2021 PET scan   IMPRESSION: 1. Widespread osseous metastatic disease again noted with areas of minimally increased metabolic activity within the sternal manubrium and L2 vertebral body. 2. No evidence of extra osseous metastatic disease.      Discussed the use of AI scribe software for clinical note transcription with the patient, who gave verbal consent to proceed.  History of Present Illness   The patient, with a history of metastatic breast cancer, reports feeling generally well with no pain. She admits to having days where she doesn't feel like doing anything, but is able to  perform her routine activities, except for vacuuming due to back pain. The patient's energy level is reported to be about the same as two months ago. She has been on Fulvestrant injections for a year, and Ibrance, which she is about to finish. The patient's blood counts were a little low at the last check, and she has not been taking her multivitamin regularly.         All other systems were reviewed with the patient and are negative.  MEDICAL HISTORY:  Past Medical History:  Diagnosis Date   CHICKENPOX, HX OF 03/21/2010   Qualifier: Diagnosis of  By: Charlsie Quest RMA, Lucy     CVA (cerebral infarction) 02/2010   no residual deficits, a/w mid basal art stenosis, declined IC stent as rec by IR/neuro   Dyslipidemia    intol of statin due to >3x increase LFTs   GERD (gastroesophageal reflux disease)    hx   Hypertension, essential    Malignant neoplasm of upper-inner quadrant of left breast in female, estrogen receptor positive (HCC) 06/20/2017   Osteoarthritis    Stroke (HCC)  SURGICAL HISTORY: Past Surgical History:  Procedure Laterality Date   CHOLECYSTECTOMY  1981   MASTECTOMY W/ SENTINEL NODE BIOPSY Left 07/30/2017   Procedure: LEFT TOTAL MASTECTOMY WITH AXILLARY SENTINEL LYMPH NODE BIOPSY;  Surgeon: Emelia Loron, MD;  Location: [facility] OR;  Service: General;  Laterality: Left;    I have reviewed the social history and family history with the patient and they are unchanged from previous note.  ALLERGIES:  is allergic to influenza vaccines, pneumococcal vaccines, simvastatin, tetanus toxoids, and zoster vaccine live.  MEDICATIONS:  Current Outpatient Medications  Medication Sig Dispense Refill   acetaminophen (TYLENOL) 650 MG CR tablet Take 1,300 mg by mouth every 8 (eight) hours as needed for pain.     amLODipine (NORVASC) 2.5 MG tablet Take 1 tablet (2.5 mg total) by mouth daily. 90 tablet 3   aspirin 81 MG tablet Take 81 mg by mouth 2 (two) times a week.     clopidogrel (PLAVIX)  75 MG tablet TAKE 1 TABLET(75 MG) BY MOUTH DAILY 90 tablet 1   furosemide (LASIX) 20 MG tablet Take 20 mg by mouth daily as needed for fluid.     loratadine (CLARITIN) 10 MG tablet Take 10 mg by mouth daily as needed for allergies.     Menthol, Topical Analgesic, (BIOFREEZE EX) Apply 1 application topically daily as needed (muscle pain).     milk thistle 175 MG tablet Take 175 mg by mouth daily.     Multiple Vitamin (MULTIVITAMIN) capsule Take 1 capsule by mouth daily.     nebivolol (BYSTOLIC) 10 MG tablet Take 1 tablet (10 mg total) by mouth daily. 90 tablet 3   palbociclib (IBRANCE) 100 MG tablet Take 1 tablet (100 mg total) by mouth daily. Take days 1-14 and 7 days off for a  21 day cycle 21 tablet 2   No current facility-administered medications for this visit.    PHYSICAL EXAMINATION: ECOG PERFORMANCE STATUS: 1 - Symptomatic but completely ambulatory  Vitals:   10/28/23 1245 10/28/23 1251  BP: (!) 196/43 (!) 170/68  Pulse: (!) 41 (!) 40  Resp:    Temp:    SpO2:     Wt Readings from Last 3 Encounters:  10/28/23 146 lb 7 oz (66.4 kg)  09/20/23 143 lb (64.9 kg)  06/03/23 142 lb 9.6 oz (64.7 kg)     GENERAL:alert, no distress and comfortable SKIN: skin color, texture, turgor are normal, no rashes or significant lesions EYES: normal, Conjunctiva are pink and non-injected, sclera clear NECK: supple, thyroid normal size, non-tender, without nodularity LYMPH:  no palpable lymphadenopathy in the cervical, axillary  LUNGS: clear to auscultation and percussion with normal breathing effort HEART: regular rate & rhythm and no murmurs and no lower extremity edema ABDOMEN:abdomen soft, non-tender and normal bowel sounds Musculoskeletal:no cyanosis of digits and no clubbing  NEURO: alert & oriented x 3 with fluent speech, no focal motor/sensory deficits   LABORATORY DATA:  I have reviewed the data as listed    Latest Ref Rng & Units 10/28/2023    1:05 PM 09/02/2023   10:06 AM  06/03/2023   12:55 PM  CBC  WBC 4.0 - 10.5 K/uL 2.8  2.0  2.5   Hemoglobin 12.0 - 15.0 g/dL 9.7  8.8  9.3   Hematocrit 36.0 - 46.0 % 28.6  25.7  27.0   Platelets 150 - 400 K/uL 89  143  161         Latest Ref Rng & Units 10/28/2023  1:05 PM 09/02/2023   10:06 AM 06/03/2023   12:55 PM  CMP  Glucose 70 - 99 mg/dL 409  95  86   BUN 8 - 23 mg/dL 34  37  40   Creatinine 0.44 - 1.00 mg/dL 8.11  9.14  7.82   Sodium 135 - 145 mmol/L 145  142  140   Potassium 3.5 - 5.1 mmol/L 4.1  4.3  4.7   Chloride 98 - 111 mmol/L 112  112  108   CO2 22 - 32 mmol/L 24  23  24    Calcium 8.9 - 10.3 mg/dL 9.7  9.0  9.7   Total Protein 6.5 - 8.1 g/dL 7.1  6.5  7.2   Total Bilirubin <1.2 mg/dL 0.4  0.4  0.5   Alkaline Phos 38 - 126 U/L 162  126  140   AST 15 - 41 U/L 22  18  21    ALT 0 - 44 U/L 11  11  11        RADIOGRAPHIC STUDIES: I have personally reviewed the radiological images as listed and agreed with the findings in the report. No results found.    Orders Placed This Encounter  Procedures   Cancer antigen 27.29    Standing Status:   Standing    Number of Occurrences:   20    Standing Expiration Date:   10/25/2024   CBC with Differential/Platelet    Standing Status:   Standing    Number of Occurrences:   50    Standing Expiration Date:   10/27/2024   Comprehensive metabolic panel    Standing Status:   Standing    Number of Occurrences:   50    Standing Expiration Date:   10/27/2024   Ferritin    Standing Status:   Future    Number of Occurrences:   1    Standing Expiration Date:   10/27/2024   Vitamin B12    Standing Status:   Future    Number of Occurrences:   1    Standing Expiration Date:   10/27/2024   Retic Panel    Standing Status:   Future    Number of Occurrences:   1    Standing Expiration Date:   10/27/2024   EKG 12-Lead    Ordered by an unspecified provider    EKG 12-Lead    Ordered by an unspecified provider    Sample to Blood Bank    Standing Status:   Future     Number of Occurrences:   1    Standing Expiration Date:   10/27/2024   Pt and her daughter had many questions today. All questions were answered. The patient knows to call the clinic with any problems, questions or concerns. No barriers to learning was detected. The total time spent in the appointment was 40 minutes.     Malachy Mood, MD 10/28/2023

## 2023-10-28 NOTE — Telephone Encounter (Signed)
Oral Oncology Patient Advocate Encounter   Submitted application for assistance for Ibrance to [employer].   Application submitted via e-fax to [phone]   [employer] phone number [phone].   I will continue to check the status until final determination.   Betrice Wanat, CPhT-Adv Oncology Pharmacy Patient Advocate [facility] Direct Number: [phone]  Fax: [phone]   

## 2023-10-29 LAB — CANCER ANTIGEN 27.29: CA 27.29: 142.1 U/mL — ABNORMAL HIGH (ref 0.0–38.6)

## 2023-10-29 NOTE — Telephone Encounter (Signed)
Spoke with patient today and instructions given.  Will call her back next Tuesday for BP readings.

## 2023-11-05 ENCOUNTER — Encounter: Payer: Self-pay | Admitting: Hematology

## 2023-11-07 LAB — GUARDANT 360

## 2023-11-11 NOTE — Telephone Encounter (Signed)
Spoke with patient today and she had eyes dilated.  Will call her tomorrow and get readings.

## 2023-11-12 NOTE — Telephone Encounter (Signed)
Called and left message for patient to return call to clinic to provide updated blood pressure readings.   My-chart message sent as well.

## 2023-11-14 MED ORDER — HYDRALAZINE HCL 50 MG PO TABS: 50.0000 mg | ORAL_TABLET | Freq: Three times a day (TID) | ORAL | 5 refills | Status: DC

## 2023-11-14 NOTE — Telephone Encounter (Signed)
Lets increase hydralazine to 50 mg three times a day.  New rx sent to pharmacy for 50 mg pill - can take 2 of the 25 mg pills

## 2023-11-14 NOTE — Addendum Note (Signed)
Addended by: Pincus Sanes on: 11/14/2023 11:13 AM   Modules accepted: Orders

## 2023-11-15 ENCOUNTER — Other Ambulatory Visit: Payer: Self-pay

## 2023-11-19 ENCOUNTER — Encounter: Payer: Self-pay | Admitting: Hematology

## 2023-11-19 ENCOUNTER — Other Ambulatory Visit (HOSPITAL_COMMUNITY): Payer: Self-pay

## 2023-11-19 ENCOUNTER — Telehealth: Payer: Self-pay | Admitting: Pharmacy Technician

## 2023-11-19 NOTE — Telephone Encounter (Signed)
Oral Oncology Patient Advocate Encounter  Was successful in securing patient a $15,000 grant from [employer] to provide copayment coverage for Shirley Wells.  This will keep the out of pocket expense at $0.     [employer] ID: [id number]  I have spoken with the patient.   The billing information is as follows and has been shared with [facility].    RxBin: [id number] PCN: [id number] Member ID: [id number] Group ID: [id number] Dates of Eligibility: 10/20/23 through 10/18/24  Fund:  Breast  Jinger Neighbors, CPhT-Adv Oncology Pharmacy Patient Advocate [facility] Direct Number: [phone]  Fax: [phone]

## 2023-11-19 NOTE — Telephone Encounter (Signed)
Oral Oncology Patient Advocate Encounter  Application will be cancelled. Patient has been awarded a Camera operator [employer] that will cover the cost of her Ilda Foil in 2025.  Jinger Neighbors, CPhT-Adv Oncology Pharmacy Patient Advocate [facility] Direct Number: [phone]  Fax: [phone]

## 2023-11-21 ENCOUNTER — Other Ambulatory Visit (HOSPITAL_COMMUNITY): Payer: Self-pay

## 2023-11-21 ENCOUNTER — Other Ambulatory Visit: Payer: Self-pay | Admitting: Pharmacy Technician

## 2023-11-21 ENCOUNTER — Other Ambulatory Visit: Payer: Self-pay

## 2023-11-21 MED ORDER — PALBOCICLIB 100 MG PO TABS
100.0000 mg | ORAL_TABLET | Freq: Every day | ORAL | 2 refills | Status: DC
  Filled 2023-11-21 (×2): qty 14, 21d supply, fill #0

## 2023-11-21 NOTE — Progress Notes (Signed)
Oral Chemotherapy Pharmacist Encounter  Patient was previously counseled under telephone encounter from 12/19/20.  Sherry Ruffing, PharmD, BCPS, BCOP Hematology/Oncology Clinical Pharmacist [facility] Oral Chemotherapy Navigation Clinics [phone] 11/21/2023 2:19 PM

## 2023-11-21 NOTE — Progress Notes (Signed)
Specialty Pharmacy Initial Fill Coordination Note  Shirley Wells is a 82 y.o. female contacted today regarding refills of specialty medication(s) Palbociclib Ilda Foil) .  Patient requested Delivery  on 11/25/23  to verified address [address]   [place] [id number]-   Medication will be filled on 11/21/23.   Patient is aware of $0 copayment.

## 2023-11-22 ENCOUNTER — Telehealth: Payer: Self-pay | Admitting: Hematology

## 2023-11-22 ENCOUNTER — Other Ambulatory Visit: Payer: Self-pay

## 2023-11-24 NOTE — Progress Notes (Signed)
 Patient Care Team: Geofm Glade PARAS, MD as PCP - General (Internal Medicine) Rosemarie Eather RAMAN, MD as Consulting Physician (Neurology) Ebbie Cough, MD as Consulting Physician (General Surgery) Lanny Callander, MD as Consulting Physician (Hematology) Dewey Rush, MD as Consulting Physician (Radiation Oncology)    I connected with Shirley Wells on 11/26/23 at  8:30 AM EST by telephone visit and verified that I am speaking with the correct person using two identifiers.   I discussed the limitations, risks, security and privacy concerns of performing an evaluation and management service by telemedicine and the availability of in-person appointments. I also discussed with the patient that there may be a patient responsible charge related to this service. The patient expressed understanding and agreed to proceed.   Other persons participating in the visit and their role in the encounter: N/A   Patient's location: Home  Provider's location: [facility] office    Chief Complaint: Follow up Guardant 36o results   CURRENT THERAPY: Second line fulvestrant  injection and Ibrance    INTERVAL HISTORY Called pt for scheduled phone visit to review Guardant 360. She seems flustered, having a problem with her eye and can barely open it. She is rushing to get to an eye dr appt this morning. Received new fill of Ibrance  and has been taking it for several days. Tolerating well. Also reports BP stays in 150's, not coming down much on hydralazine .   Past Medical History:  Diagnosis Date   CHICKENPOX, HX OF 03/21/2010   Qualifier: Diagnosis of  By: Wilhemina RMA, Lucy     CVA (cerebral infarction) 02/2010   no residual deficits, a/w mid basal art stenosis, declined IC stent as rec by IR/neuro   Dyslipidemia    intol of statin due to >3x increase LFTs   GERD (gastroesophageal reflux disease)    hx   Hypertension, essential    Malignant neoplasm of upper-inner quadrant of left breast in female, estrogen receptor positive  (HCC) 06/20/2017   Osteoarthritis    Stroke [facility]      Past Surgical History:  Procedure Laterality Date   CHOLECYSTECTOMY  1981   MASTECTOMY W/ SENTINEL NODE BIOPSY Left 07/30/2017   Procedure: LEFT TOTAL MASTECTOMY WITH AXILLARY SENTINEL LYMPH NODE BIOPSY;  Surgeon: Ebbie Cough, MD;  Location: [facility] OR;  Service: General;  Laterality: Left;     Outpatient Encounter Medications as of 11/26/2023  Medication Sig Note   acetaminophen  (TYLENOL ) 650 MG CR tablet Take 1,300 mg by mouth every 8 (eight) hours as needed for pain.    amLODipine  (NORVASC ) 2.5 MG tablet Take 1 tablet (2.5 mg total) by mouth daily.    aspirin  81 MG tablet Take 81 mg by mouth 2 (two) times a week. 12/06/2020: On hold for procedure    clopidogrel  (PLAVIX ) 75 MG tablet TAKE 1 TABLET(75 MG) BY MOUTH DAILY    furosemide  (LASIX ) 20 MG tablet Take 20 mg by mouth daily as needed for fluid.    hydrALAZINE  (APRESOLINE ) 50 MG tablet Take 1 tablet (50 mg total) by mouth 3 (three) times daily.    loratadine (CLARITIN) 10 MG tablet Take 10 mg by mouth daily as needed for allergies.    Menthol, Topical Analgesic, (BIOFREEZE EX) Apply 1 application topically daily as needed (muscle pain).    milk thistle 175 MG tablet Take 175 mg by mouth daily.    Multiple Vitamin (MULTIVITAMIN) capsule Take 1 capsule by mouth daily.    palbociclib  (IBRANCE ) 100 MG tablet Take 1 tablet (100  mg total) by mouth daily. Take days 1-14 and 7 days off for a  21 day cycle    No facility-administered encounter medications on file as of 11/26/2023.     There were no vitals filed for this visit. There is no height or weight on file to calculate BMI.   PHYSICAL EXAM Phone visit   LAB DATA No labs for this visit       ASSESSMENT & PLAN: 82 yo female  Malignant neoplasm of upper-inner quadrant of left breast in female, estrogen receptor positive; pT2N0, Stage 1b, ER/PR+/HER2-, Grade 2, RS 23, Bone metastasis in 11/2020 ER+/PR+  -initially  diagnosed with left invasive lobular breast cancer in 05/2017. S/p left total mastectomy. She declined antiestrogen therapy due to concerns of side effects. She lost follow up after 07/2017.   -After 6 months of left hip and ribcage pain she was found to have diffuse bone metastasis on 11/28/20 PET. This was confirmed on 12/09/20 bone biopsy -she started first-line Anastrozole  in 11/2020 and Ibrance  on 01/09/21, not very compliant with Ibrance   -progressed on PET 10/03/2022 -changed her tx to fulvestrant  injection and Verzenio . Due to GI side effect, changed Verzenio  to Ibrance  -Restaging PET scan from January 28, 2023 showed overall stable disease, with some lesion mildly improved -Will continue fulvestrant  and Ibrance  -restaging PET on 11/18 showed worsening bone metastasis  -Last dose Fulvestrant  10/28/23 and Guardant 360 was drawn. Dr. 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 Given that Shirley Wells is preoccupied by her acute eye issue and not ready to discuss treatment options, and that Dr. Lanny is not in clinic to discuss, will complete the discussion at a later date. Pt agrees.   -In the meantime, I instructed her to stop Ibrance  in anticipation of treatment change.     PLAN: -Stop Ibrance  -Postpone phone visit to 1/2 or 1/3 when Pt's eye condition has stabilized and Dr. Lanny returns to clinic to discuss treatment options per Guardant 360 findings   The patient knows to call the clinic with any problems, questions or concerns. No barriers to learning were detected. I spent 4 minutes counseling the patient non face to face.   Shirley Tavenner, NP-C 11/26/2023

## 2023-11-26 ENCOUNTER — Encounter: Payer: Self-pay | Admitting: Nurse Practitioner

## 2023-11-26 ENCOUNTER — Inpatient Hospital Stay: Payer: Medicare FFS | Admitting: Nurse Practitioner

## 2023-11-26 DIAGNOSIS — Z170 Estrogen receptor positive status [ER+]: Secondary | ICD-10-CM

## 2023-11-28 ENCOUNTER — Telehealth: Payer: Self-pay | Admitting: Hematology

## 2023-11-29 ENCOUNTER — Inpatient Hospital Stay: Payer: Medicare FFS | Attending: Hematology | Admitting: Hematology

## 2023-11-29 ENCOUNTER — Telehealth: Payer: Self-pay | Admitting: Pharmacy Technician

## 2023-11-29 ENCOUNTER — Other Ambulatory Visit (HOSPITAL_COMMUNITY): Payer: Self-pay

## 2023-11-29 ENCOUNTER — Telehealth: Payer: Self-pay | Admitting: Pharmacist

## 2023-11-29 ENCOUNTER — Encounter: Payer: Self-pay | Admitting: Hematology

## 2023-11-29 DIAGNOSIS — Z170 Estrogen receptor positive status [ER+]: Secondary | ICD-10-CM | POA: Diagnosis not present

## 2023-11-29 DIAGNOSIS — C50212 Malignant neoplasm of upper-inner quadrant of left female breast: Secondary | ICD-10-CM | POA: Diagnosis not present

## 2023-11-29 MED ORDER — ELACESTRANT HYDROCHLORIDE 345 MG PO TABS: 345.0000 mg | ORAL_TABLET | Freq: Every day | ORAL | 0 refills | Status: DC

## 2023-11-29 NOTE — Progress Notes (Signed)
 [facility]   [phone] [phone]   Clinic Follow up Note   Patient Care Team: Geofm Glade PARAS, MD as PCP - General (Internal Medicine) Rosemarie Eather RAMAN, MD as Consulting Physician (Neurology) Ebbie Cough, MD as Consulting Physician (General Surgery) Lanny Callander, MD as Consulting Physician (Hematology) Dewey Rush, MD as Consulting Physician (Radiation Oncology) 11/29/2023  I connected with Shirley Wells on 11/29/23 at  1:00 PM EST by telephone and verified that I am speaking with the correct person using two identifiers.   I discussed the limitations, risks, security and privacy concerns of performing an evaluation and management service by telephone and the availability of in person appointments. I also discussed with the patient that there may be a patient responsible charge related to this service. The patient expressed understanding and agreed to proceed.   Patient's location: Home Provider's location:  Office    CHIEF COMPLAINT: Follow-up of metastatic breast cancer   CURRENT THERAPY: Pending elacestrant   Oncology history Malignant neoplasm of upper-inner quadrant of left breast in female, estrogen receptor positive (HCC) -pT2N0, Stage 1b, ER/PR+/HER2-, Grade 2, 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 S/p left total mastectomy. She declined antiestrogen therapy due to concerns of side effects. She lost follow up after 07/2017.   -After 6 months of left hip and ribcage pain she was found to have diffuse bone metastasis on 11/28/20 PET. This was confirmed on 12/09/20 bone biopsy --she started first-line Anastrozole  in 11/2020 and Ibrance  on 01/09/21, not very compliant with Ibrance   -progressed on PET 10/03/2022 -changed her tx to fulvestrant  injection and Verzenio . 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 Current treatment regimen ineffective. Last fulvestrant  injection on October 28, 2023.  Due to her disease progression on recent scan, I recommended changing treatment.  -Based on the Guardant360 results, I discussed treatment option of elacestrant  (oral anti-estrogen) and combination therapy (PIK3CA inhibitor, Ibrance  and fulvestrant ). Chose elacestrant  for better tolerability and ease of administration. Potential side effects: cholesterol impact, gastrointestinal symptoms (nausea, diarrhea), hot flashes, liver function impact.  Patient is not interested in PIK3CA inhibitor due to high risk of hypoglycemia. 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 Attending daily appointments and has obtained necessary medication. - Continue current ophthalmologic treatment - Attend follow-up appointment on Wednesday  Plan -I called and elacestrant  345 mg daily for her, she was started when she received it. -She knows to stop Ibrance  - Follow up in one month with lab       SUMMARY OF ONCOLOGIC HISTORY: Oncology History Overview Note   Cancer Staging  Malignant neoplasm of upper-inner quadrant of left breast in female, estrogen receptor positive (HCC) Staging form: Breast, AJCC 8th Edition - Clinical stage from 06/18/2017: Stage IB (cT2, cN0, cM0, G2, ER+, PR+, HER2-) - Signed by Lanny Callander, MD on 06/24/2017 Histologic grading system: 3 grade system - Pathologic stage from 07/30/2017: Stage IA  (  pT2, pN0, cM0, G2, ER+, PR+, HER2-, Oncotype DX score: 23) - Signed by Lanny Callander, MD on 08/23/2017 Neoadjuvant therapy: No Nuclear grade: G2 Multigene prognostic tests performed: Oncotype DX Recurrence score range: Greater than or equal to 11 Histologic grading system: 3 grade system Residual tumor (R): R0 - None Laterality: Left     Malignant neoplasm of upper-inner quadrant of left breast in female, estrogen receptor positive (HCC)  06/17/2017 Mammogram   US  and MM diagnostic Breast Tomo Bilateral  IMPRESSION: 1. 3.4 palpable mass in the 10 o'clock position of the left breast is highly suspicious for malignancy.  2. No evidence of malignancy in the right breast.  3. Ultrasound of the left axilla shows normal sized axillary lymph nodes.   06/18/2017 Initial Biopsy   Diagnosis 06/18/17 Breast, left, needle core biopsy, 10:00 o'clock - INVASIVE MAMMARY CARCINOMA. At least G2  The malignant cells are negative for E-Cadherin, supporting a lobular phenotype.   06/18/2017 Initial Diagnosis   Malignant neoplasm of upper-inner quadrant of left breast in female, estrogen receptor positive (HCC)   06/18/2017 Receptors her2   ER 95%+, PR 25%+, both strong staining  HER2- Ki67 30%    07/30/2017 Surgery   LEFT TOTAL MASTECTOMY WITH AXILLARY SENTINEL LYMPH NODE BIOPSY by Dr. Ebbie on 07/30/17   07/30/2017 Pathology Results   Diagnosis 07/30/17 1. Breast, simple mastectomy, Left Total - INVASIVE LOBULAR CARCINOMA, GRADE 2, SPANNING 3.5 CM. - LOBULAR CARCINOMA IN SITU. - INVASIVE CARCINOMA IS BROADLY 0.1 CM FROM POSTERIOR MARGIN. - PERINEURAL INVASION PRESENT. - SEE ONCOLOGY TABLE. 2. Lymph node, sentinel, biopsy, Left Axillary - ONE OF ONE LYMPH NODES NEGATIVE FOR CARCINOMA (0/1). 3. Lymph node, sentinel, biopsy, Left - ONE OF ONE LYMPH NODES NEGATIVE FOR CARCINOMA (0/1).   07/30/2017 Oncotype testing   Oncotype 07/30/17 Recurrence score is 23, an intermediate risk With a 10-year risk of  recurrence at 15% with tamoxifen  alone.     11/11/2020 Imaging   MRI Lumbar Spine  IMPRESSION: Diffuse osseous metastases. Chronic inferior L2 endplate deformity with mild height loss.   Likely late subacute pathologic fracture deformities involving the superior L1 endplate and right L3 lamina.   Multilevel spondylosis. Moderate to severe spinal canal and neural foraminal narrowing at the L3-4 and L4-5 levels.   Mild to moderate spinal canal and neural foraminal narrowing at the L5-S1 level.   11/28/2020 PET scan   IMPRESSION: 1. Widespread and diffuse hypermetabolic bony metastases. 2. No evidence for soft tissue metastases in the neck, chest, abdomen, or pelvis. 3.  Aortic Atherosclerois (ICD10-170.0)     11/29/2020 -  Chemotherapy   Zometa  q58months starting 11/29/20. Held after first dose. Given her CKD will only give when she has hypercalcemia and may switch to Prolia.     12/04/2020 Imaging   MRI Brain  IMPRESSION: No evidence of intracranial metastatic disease.   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   Mild chronic microvascular ischemic changes. Small chronic right cerebellar infarct.     12/07/2020 - 12/21/2020 Radiation Therapy   palliative RT to left hip with Dr Dewey on 12/07/20-12/21/20   12/09/2020 Pathology Results   FINAL MICROSCOPIC DIAGNOSIS:   A. BONE, BIOPSY:  - Metastatic carcinoma.  - See comment.   COMMENT:  The morphology is compatible with metastatic lobular breast carcinoma.  Estrogen receptor, progesterone receptor and HER2/neu will be performed.   ADDENDUM:   PROGNOSTIC INDICATOR RESULTS:   Immunohistochemical and morphometric analysis performed manually   The tumor  cells are EQUIVOCAL for Her2 (2+).  Her2 by FISH will be performed and the results will be reported  separately.   Estrogen Receptor:       POSITIVE, 50%, MODERATE-STRONG STAINING  Progesterone Receptor:   POSITIVE, 15%, MODERATE-STRONG STAINING     11/2020 -  Anti-estrogen oral therapy   First-line Anti-estrogen therapy Anastrozole  1mg  daily starting in 11/2020 and Ibrance  starting in 12/2020    01/09/2021 -  Chemotherapy   Ibrance  3 weeks on/1 weeks off starting beginning of 01/09/21. Reduced to 75mg  starting with C2 (02/13/21) due to cytopenia and fatigue. Reduced to 75mg  2 weeks on/1 week off starting C3 on 03/13/21)    03/31/2021 PET scan   IMPRESSION: 1. Dramatic reduction in metabolic activity throughout the skeleton. Metabolic activity of diffuse sclerotic skeletal lesions is now essentially at background blood pool level. No focal metabolic activity. 2. Persistent diffuse confluent sclerotic skeletal metastasis unchanged. 3. No evidence of soft tissue breast cancer metastasis.   10/13/2021 PET scan   IMPRESSION: 1. Widespread osseous metastatic disease again noted with areas of minimally increased metabolic activity within the sternal manubrium and L2 vertebral body. 2. No evidence of extra osseous metastatic disease.     Discussed the use of AI scribe software for clinical note transcription with the patient, who gave verbal consent to proceed.  History of Present Illness   The patient, an 83 year old female with metastatic breast cancer, reports a significant issue with her eye. She describes it as a virus-like infection that has severely affected her vision, particularly in her right eye. She has been seeing a doctor daily for this issue and has been prescribed medication, which she has started using. Despite this eye issue, the patient reports feeling good overall.  The patient has been on Effient for her cancer treatment, but the doctor suggests that it is not working well. The patient has a three-week supply left of this medication. The patient's last injection was on December 2nd, and the doctor suggests stopping these as she does not seem to be effective.         REVIEW OF SYSTEMS:   Constitutional: Denies fevers,  chills or abnormal weight loss Eyes: Denies blurriness of vision Ears, nose, mouth, throat, and face: Denies mucositis or sore throat Respiratory: Denies cough, dyspnea or wheezes Cardiovascular: Denies palpitation, chest discomfort or lower extremity swelling Gastrointestinal:  Denies nausea, heartburn or change in bowel habits Skin: Denies abnormal skin rashes Lymphatics: Denies new lymphadenopathy or easy bruising Neurological:Denies numbness, tingling or new weaknesses Behavioral/Psych: Mood is stable, no new changes  All other systems were reviewed with the patient and are negative.  MEDICAL HISTORY:  Past Medical History:  Diagnosis Date   CHICKENPOX, HX OF 03/21/2010   Qualifier: Diagnosis of  By: Wilhemina RMA, Lucy     CVA (cerebral infarction) 02/2010   no residual deficits, a/w mid basal art stenosis, declined IC stent as rec by IR/neuro   Dyslipidemia    intol of statin due to >3x increase LFTs   GERD (gastroesophageal reflux disease)    hx   Hypertension, essential    Malignant neoplasm of upper-inner quadrant of left breast in female, estrogen receptor positive (HCC) 06/20/2017   Osteoarthritis    Stroke [facility])     SURGICAL HISTORY: Past Surgical History:  Procedure Laterality Date   CHOLECYSTECTOMY  1981   MASTECTOMY W/ SENTINEL NODE BIOPSY Left 07/30/2017   Procedure: LEFT TOTAL MASTECTOMY WITH AXILLARY SENTINEL LYMPH NODE BIOPSY;  Surgeon: Ebbie,  Donnice, MD;  Location: [facility] OR;  Service: General;  Laterality: Left;    I have reviewed the social history and family history with the patient and they are unchanged from previous note.  ALLERGIES:  is allergic to influenza vaccines, pneumococcal vaccines, simvastatin, tetanus toxoids, and zoster vaccine live.  MEDICATIONS:  Current Outpatient Medications  Medication Sig Dispense Refill   elacestrant  hydrochloride (ORSERDU ) 345 MG tablet Take 1 tablet (345 mg total) by mouth daily. Take with food. 30 tablet 0    acetaminophen  (TYLENOL ) 650 MG CR tablet Take 1,300 mg by mouth every 8 (eight) hours as needed for pain.     amLODipine  (NORVASC ) 2.5 MG tablet Take 1 tablet (2.5 mg total) by mouth daily. 90 tablet 3   aspirin  81 MG tablet Take 81 mg by mouth 2 (two) times a week.     clopidogrel  (PLAVIX ) 75 MG tablet TAKE 1 TABLET(75 MG) BY MOUTH DAILY 90 tablet 1   furosemide  (LASIX ) 20 MG tablet Take 20 mg by mouth daily as needed for fluid.     hydrALAZINE  (APRESOLINE ) 50 MG tablet Take 1 tablet (50 mg total) by mouth 3 (three) times daily. 90 tablet 5   loratadine (CLARITIN) 10 MG tablet Take 10 mg by mouth daily as needed for allergies.     Menthol, Topical Analgesic, (BIOFREEZE EX) Apply 1 application topically daily as needed (muscle pain).     milk thistle 175 MG tablet Take 175 mg by mouth daily.     Multiple Vitamin (MULTIVITAMIN) capsule Take 1 capsule by mouth daily.     palbociclib  (IBRANCE ) 100 MG tablet Take 1 tablet (100 mg total) by mouth daily. Take days 1-14 and 7 days off for a  21 day cycle 21 tablet 2   No current facility-administered medications for this visit.    PHYSICAL EXAMINATION: Not performed   LABORATORY DATA:  I have reviewed the data as listed    Latest Ref Rng & Units 10/28/2023    1:05 PM 09/02/2023   10:06 AM 06/03/2023   12:55 PM  CBC  WBC 4.0 - 10.5 K/uL 2.8  2.0  2.5   Hemoglobin 12.0 - 15.0 g/dL 9.7  8.8  9.3   Hematocrit 36.0 - 46.0 % 28.6  25.7  27.0   Platelets 150 - 400 K/uL 89  143  161         Latest Ref Rng & Units 10/28/2023    1:05 PM 09/02/2023   10:06 AM 06/03/2023   12:55 PM  CMP  Glucose 70 - 99 mg/dL 898  95  86   BUN 8 - 23 mg/dL 34  37  40   Creatinine 0.44 - 1.00 mg/dL 8.19  8.04  8.16   Sodium 135 - 145 mmol/L 145  142  140   Potassium 3.5 - 5.1 mmol/L 4.1  4.3  4.7   Chloride 98 - 111 mmol/L 112  112  108   CO2 22 - 32 mmol/L 24  23  24    Calcium 8.9 - 10.3 mg/dL 9.7  9.0  9.7   Total Protein 6.5 - 8.1 g/dL 7.1  6.5  7.2   Total  Bilirubin <1.2 mg/dL 0.4  0.4  0.5   Alkaline Phos 38 - 126 U/L 162  126  140   AST 15 - 41 U/L 22  18  21    ALT 0 - 44 U/L 11  11  11        RADIOGRAPHIC STUDIES: I  have personally reviewed the radiological images as listed and agreed with the findings in the report. No results found.     I discussed the assessment and treatment plan with the patient. The patient was provided an opportunity to ask questions and all were answered. The patient agreed with the plan and demonstrated an understanding of the instructions.   The patient was advised to call back or seek an in-person evaluation if the symptoms worsen or if the condition fails to improve as anticipated.  I provided 22 minutes of non face-to-face telephone visit time during this encounter, and > 50% was spent counseling as documented under my assessment & plan.     Onita Mattock, MD 11/29/23

## 2023-11-29 NOTE — Telephone Encounter (Signed)
 Oral Oncology Pharmacist Encounter  Received new prescription for Orserdu  (elacestrant ) for the treatment of metastatic ER/PR positive, HER-2 negative, ESR1 mutated breast cancer, planned duration until disease progression or unacceptable drug toxicity.  CBC w/ Diff and CMP from 10/28/23 assessed, no baseline dose adjustments required at this time. MD has placed lipid panel to be obtained to monitor throughout therapy while patient is on Orserdu . Prescription dose and frequency assessed for appropriateness.  Current medication list in Epic reviewed, no relevant/significant DDIs with Orserdu  identified.  Evaluated chart and no patient barriers to medication adherence noted.   Patient agreement for treatment documented in MD note on 11/29/23.  Due to medication being limited distribution - prescription has been sent to [facility] for dispensing.   Oral Oncology Clinic will continue to follow for insurance authorization, copayment issues, initial counseling and start date.  Asberry Macintosh, PharmD, BCPS, BCOP Hematology/Oncology Clinical Pharmacist [facility] and [facility] Oral Chemotherapy Navigation Clinics [phone] 11/29/2023 2:18 PM

## 2023-11-29 NOTE — Telephone Encounter (Signed)
 Oral Oncology Patient Advocate Encounter   Received notification that prior authorization for Orserdu  is required.   PA submitted on 11/29/23 Key [id number] Status is pending     Estefana Moellers, CPhT-Adv Oncology Pharmacy Patient Advocate [facility] Direct Number: [phone]  Fax: [phone]

## 2023-11-29 NOTE — Telephone Encounter (Signed)
 Oral Oncology Patient Advocate Encounter  Prior Authorization for Orserdu  has been approved.    PA# [id number] Effective dates: 11/29/23 through 05/27/24  Patients co-pay is $1,982.97    Estefana Moellers, CPhT-Adv Oncology Pharmacy Patient Advocate [facility] Direct Number: [phone]  Fax: [phone]

## 2023-11-29 NOTE — Assessment & Plan Note (Signed)
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 S/p left total mastectomy. She declined antiestrogen therapy due to concerns of side effects. She lost follow up after 07/2017.   -After 6 months of left hip and ribcage pain she was found to have diffuse bone metastasis on 11/28/20 PET. This was confirmed on 12/09/20 bone biopsy --she started first-line Anastrozole  in 11/2020 and Ibrance  on 01/09/21, not very compliant with Ibrance   -progressed on PET 10/03/2022 -changed her tx to fulvestrant  injection and Verzenio . 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

## 2023-12-03 NOTE — Telephone Encounter (Signed)
 Oral Chemotherapy Pharmacist Encounter   Patient will start Orserdu  (elacestrant ) on 12/04/23   Patient Education I spoke with patient for overview of new oral chemotherapy medication: Orserdu  (elacestrant ) for the treatment of metastatic ER/PR positive, HER-2 negative, ESR1 mutated breast cancer, planned duration until disease progression or unacceptable drug toxicity.   Counseled patient on administration, dosing, side effects, monitoring, drug-food interactions, safe handling, storage, and disposal.   Patient will take Orserdu  345 mg tablets, 1 tablet (345 mg total) by mouth once daily with food.   Side effects include but not limited to: nausea/vomiting, musculoskeletal pain, changes in liver function tests, changes in lipid panel and fatigue.   Patient declined having an anti-emetic called in to have on hand at home.    Reviewed with patient importance of keeping a medication schedule and plan for any missed doses.   After discussion no patient barriers to medication adherence identified.    Shirley Wells voiced understanding and appreciation. All questions answered. Medication handout provided.   Provided patient with Oral Chemotherapy Navigation Clinic phone number. Patient knows to call the office with questions or concerns. Oral Chemotherapy Navigation Clinic will continue to follow.  Asberry Rummer, PharmD, BCPS, [facility] Hematology/Oncology Clinical Pharmacist [facility] and [facility] Oral Chemotherapy Navigation Clinics [phone] 12/03/2023 9:16 AM

## 2023-12-11 ENCOUNTER — Other Ambulatory Visit: Payer: Self-pay

## 2023-12-11 NOTE — Progress Notes (Signed)
 Ibrance  therapy has been discontinued. Disenrolling patient of Specialty Services.

## 2023-12-25 ENCOUNTER — Other Ambulatory Visit: Payer: Self-pay

## 2023-12-25 MED ORDER — ELACESTRANT HYDROCHLORIDE 345 MG PO TABS: 345.0000 mg | ORAL_TABLET | Freq: Every day | ORAL | 0 refills | Status: DC

## 2023-12-29 NOTE — Assessment & Plan Note (Signed)
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 S/p left total mastectomy. She declined antiestrogen therapy due to concerns of side effects. She lost follow up after 07/2017.   -After 6 months of left hip and ribcage pain she was found to have diffuse bone metastasis on 11/28/20 PET. This was confirmed on 12/09/20 bone biopsy --she started first-line Anastrozole in 11/2020 and Ibrance on 01/09/21, not very compliant with Ibrance  -progressed on PET 10/03/2022 -changed her tx to fulvestrant injection and Verzenio. 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

## 2023-12-30 ENCOUNTER — Encounter: Payer: Self-pay | Admitting: Hematology

## 2023-12-30 ENCOUNTER — Inpatient Hospital Stay: Payer: Medicare FFS | Attending: Hematology

## 2023-12-30 ENCOUNTER — Telehealth: Payer: Self-pay | Admitting: Radiation Oncology

## 2023-12-30 ENCOUNTER — Inpatient Hospital Stay (HOSPITAL_BASED_OUTPATIENT_CLINIC_OR_DEPARTMENT_OTHER): Payer: Medicare FFS | Admitting: Hematology

## 2023-12-30 VITALS — BP 160/60 | HR 63 | Temp 97.5°F | Resp 16 | Wt 142.6 lb

## 2023-12-30 DIAGNOSIS — Z1721 Progesterone receptor positive status: Secondary | ICD-10-CM | POA: Insufficient documentation

## 2023-12-30 DIAGNOSIS — Z1732 Human epidermal growth factor receptor 2 negative status: Secondary | ICD-10-CM | POA: Insufficient documentation

## 2023-12-30 DIAGNOSIS — Z9012 Acquired absence of left breast and nipple: Secondary | ICD-10-CM | POA: Diagnosis not present

## 2023-12-30 DIAGNOSIS — H44009 Unspecified purulent endophthalmitis, unspecified eye: Secondary | ICD-10-CM | POA: Diagnosis not present

## 2023-12-30 DIAGNOSIS — C7951 Secondary malignant neoplasm of bone: Secondary | ICD-10-CM | POA: Insufficient documentation

## 2023-12-30 DIAGNOSIS — C50212 Malignant neoplasm of upper-inner quadrant of left female breast: Secondary | ICD-10-CM

## 2023-12-30 DIAGNOSIS — E785 Hyperlipidemia, unspecified: Secondary | ICD-10-CM | POA: Insufficient documentation

## 2023-12-30 DIAGNOSIS — Z170 Estrogen receptor positive status [ER+]: Secondary | ICD-10-CM | POA: Insufficient documentation

## 2023-12-30 DIAGNOSIS — G8929 Other chronic pain: Secondary | ICD-10-CM | POA: Insufficient documentation

## 2023-12-30 LAB — COMPREHENSIVE METABOLIC PANEL
ALT: 21 U/L (ref 0–44)
AST: 28 U/L (ref 15–41)
Albumin: 3.9 g/dL (ref 3.5–5.0)
Alkaline Phosphatase: 172 U/L — ABNORMAL HIGH (ref 38–126)
Anion gap: 10 (ref 5–15)
BUN: 39 mg/dL — ABNORMAL HIGH (ref 8–23)
CO2: 21 mmol/L — ABNORMAL LOW (ref 22–32)
Calcium: 10 mg/dL (ref 8.9–10.3)
Chloride: 109 mmol/L (ref 98–111)
Creatinine, Ser: 1.59 mg/dL — ABNORMAL HIGH (ref 0.44–1.00)
GFR, Estimated: 32 mL/min — ABNORMAL LOW (ref >60)
Glucose, Bld: 104 mg/dL — ABNORMAL HIGH (ref 70–99)
Potassium: 4 mmol/L (ref 3.5–5.1)
Sodium: 140 mmol/L (ref 135–145)
Total Bilirubin: 0.4 mg/dL (ref 0.0–1.2)
Total Protein: 7.2 g/dL (ref 6.5–8.1)

## 2023-12-30 LAB — CBC WITH DIFFERENTIAL/PLATELET
Abs Immature Granulocytes: 0.05 10*3/uL (ref 0.00–0.07)
Basophils Absolute: 0.1 10*3/uL (ref 0.0–0.1)
Basophils Relative: 1 %
Eosinophils Absolute: 0.2 10*3/uL (ref 0.0–0.5)
Eosinophils Relative: 3 %
HCT: 33 % — ABNORMAL LOW (ref 36.0–46.0)
Hemoglobin: 11.1 g/dL — ABNORMAL LOW (ref 12.0–15.0)
Immature Granulocytes: 1 %
Lymphocytes Relative: 30 %
Lymphs Abs: 1.5 10*3/uL (ref 0.7–4.0)
MCH: 39.5 pg — ABNORMAL HIGH (ref 26.0–34.0)
MCHC: 33.6 g/dL (ref 30.0–36.0)
MCV: 117.4 fL — ABNORMAL HIGH (ref 80.0–100.0)
Monocytes Absolute: 0.4 10*3/uL (ref 0.1–1.0)
Monocytes Relative: 9 %
Neutro Abs: 2.8 10*3/uL (ref 1.7–7.7)
Neutrophils Relative %: 56 %
Platelets: 195 10*3/uL (ref 150–400)
RBC: 2.81 MIL/uL — ABNORMAL LOW (ref 3.87–5.11)
RDW: 14.5 % (ref 11.5–15.5)
WBC: 4.9 10*3/uL (ref 4.0–10.5)
nRBC: 0 % (ref 0.0–0.2)

## 2023-12-30 LAB — LIPID PANEL
Cholesterol: 416 mg/dL — ABNORMAL HIGH (ref 0–200)
HDL: 65 mg/dL (ref >40)
LDL Cholesterol: 290 mg/dL — ABNORMAL HIGH (ref 0–99)
Total CHOL/HDL Ratio: 6.4 {ratio}
Triglycerides: 307 mg/dL — ABNORMAL HIGH (ref <150)
VLDL: 61 mg/dL — ABNORMAL HIGH (ref 0–40)

## 2023-12-30 NOTE — Progress Notes (Signed)
[facility]   [phone] [phone]   Clinic Follow up Note   Patient Care Team: Pincus Sanes, MD as PCP - General (Internal Medicine) Micki Riley, MD as Consulting Physician (Neurology) Emelia Loron, MD as Consulting Physician (General Surgery) Malachy Mood, MD as Consulting Physician (Hematology) Dorothy Puffer, MD as Consulting Physician (Radiation Oncology)  Date of Service:  12/30/2023  CHIEF COMPLAINT: f/u of metastatic breast cancer  CURRENT THERAPY:  Elacestrant 345 mg daily, started in mid January 2024  Oncology History   Malignant neoplasm of upper-inner quadrant of left breast in female, estrogen receptor positive (HCC) -pT2N0, Stage 1b, ER/PR+/HER2-, Grade 2, 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 S/p left total mastectomy. She declined antiestrogen therapy due to concerns of side effects. She lost follow up after 07/2017.   -After 6 months of left hip and ribcage pain she was found to have diffuse bone metastasis on 11/28/20 PET. This was confirmed on 12/09/20 bone biopsy --she started first-line Anastrozole in 11/2020 and Ibrance on 01/09/21, not very compliant with Ibrance  -progressed on PET 10/03/2022 -changed her tx to fulvestrant injection and Verzenio. 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 Reports back pain, tingling toes, fatigue, and weight loss of 3 pounds in the past two weeks. Last PET scan shows significant bone lesion in the lumbar  spine L1 which is likely the cause of her low back pain. Adequate appetite but low protein intake. Discussed high-protein, high-calorie diet. Radiation therapy proposed for lumbar spine lesion to alleviate back pain. Patient expressed concerns about daily travel for radiation but agreed to proceed. Informed about potential benefits of pain relief and short session duration. 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 Pain managed with Tylenol, preferred over prescription pain medication due to constipation. Discussed potential benefit of radiation therapy for pain relief. - Continue Tylenol as needed, up to four tablets a day - Consider radiation therapy for lumbar spine lesion L1  Eye Infections Recurrent eye infections since Christmas. Currently using thick cream and eye drops. No longer on Ibrance, which may reduce infection frequency. Cataract surgery discussed but delayed due to current eye infection. Informed that new anti-estrogen therapy does not significantly impact the immune system, potentially reducing infection risk. - Continue current eye treatment regimen - Consider cataract surgery once infection resolves - Monitor for further eye infections  General Health Maintenance Encouraged to maintain a high-protein, high-calorie diet. Discussed the importance of weight management and physical activity. - Encourage high-protein, high-calorie diet - Encourage physical activity as tolerated  Plan -She is tolerating elacestrant well, will continue - Schedule follow-up in two months - Repeat PET scan in June -I made a referral to radiation oncologist Dr. Mitzi Hansen for palliative radiation to  L1 lesion         SUMMARY OF ONCOLOGIC HISTORY: Oncology History Overview Note   Cancer Staging  Malignant neoplasm of upper-inner quadrant of left breast in female, estrogen receptor positive (HCC) Staging form: Breast, AJCC 8th Edition - Clinical stage from 06/18/2017: Stage IB (cT2, cN0, cM0, G2, ER+, PR+, HER2-) - Signed by Malachy Mood, MD on 06/24/2017 Histologic grading system: 3 grade system - Pathologic stage from 07/30/2017: Stage IA (pT2, pN0, cM0, G2, ER+, PR+, HER2-, Oncotype DX score: 23) - Signed by Malachy Mood, MD on 08/23/2017 Neoadjuvant therapy: No Nuclear grade: G2 Multigene prognostic tests performed: Oncotype DX Recurrence score range: Greater than or equal to 11 Histologic grading system: 3 grade system Residual tumor (R): R0 - None Laterality: Left     Malignant neoplasm of upper-inner quadrant of left breast in female, estrogen receptor positive (HCC)  06/17/2017 Mammogram   [place] and MM diagnostic Breast Tomo Bilateral  IMPRESSION: 1. 3.4 palpable mass in the 10 o'clock position of the left breast is highly suspicious for malignancy.  2. No evidence of malignancy in the right breast.  3. Ultrasound of the left axilla shows normal sized axillary lymph nodes.   06/18/2017 Initial Biopsy   Diagnosis 06/18/17 Breast, left, needle core biopsy, 10:00 o'clock - INVASIVE MAMMARY CARCINOMA. At least G2  The malignant cells are negative for E-Cadherin, supporting a lobular phenotype.   06/18/2017 Initial Diagnosis   Malignant neoplasm of upper-inner quadrant of left breast in female, estrogen receptor positive (HCC)   06/18/2017 Receptors her2   ER 95%+, PR 25%+, both strong staining  HER2- Ki67 30%    07/30/2017 Surgery   LEFT TOTAL MASTECTOMY WITH AXILLARY SENTINEL LYMPH NODE BIOPSY by Dr. Dwain Sarna on 07/30/17   07/30/2017 Pathology Results   Diagnosis 07/30/17 1. Breast, simple mastectomy, Left Total - INVASIVE LOBULAR CARCINOMA, GRADE 2, SPANNING 3.5 CM. - LOBULAR CARCINOMA IN  SITU. - INVASIVE CARCINOMA IS BROADLY 0.1 CM FROM POSTERIOR MARGIN. - PERINEURAL INVASION PRESENT. - SEE ONCOLOGY TABLE. 2. Lymph node, sentinel, biopsy, Left Axillary - ONE OF ONE LYMPH NODES NEGATIVE FOR CARCINOMA (0/1). 3. Lymph node, sentinel, biopsy, Left - ONE OF ONE LYMPH NODES NEGATIVE FOR CARCINOMA (0/1).   07/30/2017 Oncotype testing   Oncotype 07/30/17 Recurrence score is 23, an intermediate risk With a 10-year risk of recurrence at 15% with tamoxifen alone.     11/11/2020 Imaging   MRI Lumbar Spine  IMPRESSION: Diffuse osseous metastases. Chronic inferior L2 endplate deformity with mild height loss.   Likely late subacute pathologic fracture deformities involving the superior L1 endplate and right L3 lamina.   Multilevel spondylosis. Moderate to severe spinal canal and neural foraminal narrowing at the L3-4 and L4-5 levels.   Mild to moderate spinal canal and neural foraminal narrowing at the L5-S1 level.   11/28/2020 PET scan   IMPRESSION: 1. Widespread and diffuse hypermetabolic bony metastases. 2. No evidence for soft tissue metastases in the neck, chest, abdomen, or pelvis. 3.  Aortic Atherosclerois (ICD10-170.0)     11/29/2020 -  Chemotherapy   Zometa q14months starting 11/29/20. Held after first dose. Given her CKD will only give when she has hypercalcemia and may switch to Prolia.     12/04/2020 Imaging   MRI Brain  IMPRESSION: No evidence of intracranial metastatic disease.   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   Mild chronic microvascular ischemic changes. Small chronic right cerebellar infarct.     12/07/2020 - 12/21/2020 Radiation Therapy  palliative RT to left hip with Dr Mitzi Hansen on 12/07/20-12/21/20   12/09/2020 Pathology Results   FINAL MICROSCOPIC DIAGNOSIS:   A. BONE, BIOPSY:  - Metastatic carcinoma.  - See comment.   COMMENT:  The morphology is compatible with metastatic lobular breast carcinoma.  Estrogen receptor,  progesterone receptor and HER2/neu will be performed.   ADDENDUM:   PROGNOSTIC INDICATOR RESULTS:   Immunohistochemical and morphometric analysis performed manually   The tumor cells are EQUIVOCAL for Her2 (2+).  Her2 by FISH will be performed and the results will be reported  separately.   Estrogen Receptor:       POSITIVE, 50%, MODERATE-STRONG STAINING  Progesterone Receptor:   POSITIVE, 15%, MODERATE-STRONG STAINING    11/2020 -  Anti-estrogen oral therapy   First-line Anti-estrogen therapy Anastrozole 1mg  daily starting in 11/2020 and Ibrance starting in 12/2020    01/09/2021 -  Chemotherapy   Ibrance 3 weeks on/1 weeks off starting beginning of 01/09/21. Reduced to 75mg  starting with C2 (02/13/21) due to cytopenia and fatigue. Reduced to 75mg  2 weeks on/1 week off starting C3 on 03/13/21)    03/31/2021 PET scan   IMPRESSION: 1. Dramatic reduction in metabolic activity throughout the skeleton. Metabolic activity of diffuse sclerotic skeletal lesions is now essentially at background blood pool level. No focal metabolic activity. 2. Persistent diffuse confluent sclerotic skeletal metastasis unchanged. 3. No evidence of soft tissue breast cancer metastasis.   10/13/2021 PET scan   IMPRESSION: 1. Widespread osseous metastatic disease again noted with areas of minimally increased metabolic activity within the sternal manubrium and L2 vertebral body. 2. No evidence of extra osseous metastatic disease.      Discussed the use of AI scribe software for clinical note transcription with the patient, who gave verbal consent to proceed.  History of Present Illness   The patient is an 83 year old female with a known history of metastatic breast cancer. She presents with complaints of back pain, tingling in her toes, and recurrent eye infections. The back pain, which she reports has been present for a long time, is more pronounced on the right side. She also mentions fatigue and low energy  levels, which have been persistent. Despite these symptoms, she reports a good appetite but has experienced some weight loss. She has been on medication Stevenson Clinch) for her cancer, which she has been taking since January. She has no reported issues with the medication and denies any nausea. She also mentions concerns about her cholesterol levels, which are being monitored due to the potential side effects of her cancer medication. She has been experiencing recurrent eye infections since Christmas and is currently undergoing treatment for the same. She also has concerns about her vision and is considering cataract surgery.         All other systems were reviewed with the patient and are negative.  MEDICAL HISTORY:  Past Medical History:  Diagnosis Date   CHICKENPOX, HX OF 03/21/2010   Qualifier: Diagnosis of  By: Charlsie Quest RMA, Lucy     CVA (cerebral infarction) 02/2010   no residual deficits, a/w mid basal art stenosis, declined IC stent as rec by IR/neuro   Dyslipidemia    intol of statin due to >3x increase LFTs   GERD (gastroesophageal reflux disease)    hx   Hypertension, essential    Malignant neoplasm of upper-inner quadrant of left breast in female, estrogen receptor positive (HCC) 06/20/2017   Osteoarthritis    Stroke [facility])     SURGICAL HISTORY: Past Surgical  History:  Procedure Laterality Date   CHOLECYSTECTOMY  1981   MASTECTOMY W/ SENTINEL NODE BIOPSY Left 07/30/2017   Procedure: LEFT TOTAL MASTECTOMY WITH AXILLARY SENTINEL LYMPH NODE BIOPSY;  Surgeon: Emelia Loron, MD;  Location: [facility] OR;  Service: General;  Laterality: Left;    I have reviewed the social history and family history with the patient and they are unchanged from previous note.  ALLERGIES:  is allergic to influenza vaccines, pneumococcal vaccines, simvastatin, tetanus toxoids, and zoster vaccine live.  MEDICATIONS:  Current Outpatient Medications  Medication Sig Dispense Refill   acetaminophen (TYLENOL) 650 MG  CR tablet Take 1,300 mg by mouth every 8 (eight) hours as needed for pain.     amLODipine (NORVASC) 2.5 MG tablet Take 1 tablet (2.5 mg total) by mouth daily. 90 tablet 3   aspirin 81 MG tablet Take 81 mg by mouth 2 (two) times a week.     clopidogrel (PLAVIX) 75 MG tablet TAKE 1 TABLET(75 MG) BY MOUTH DAILY 90 tablet 1   elacestrant hydrochloride (ORSERDU) 345 MG tablet Take 1 tablet (345 mg total) by mouth daily. Take with food. 30 tablet 0   furosemide (LASIX) 20 MG tablet Take 20 mg by mouth daily as needed for fluid.     hydrALAZINE (APRESOLINE) 50 MG tablet Take 1 tablet (50 mg total) by mouth 3 (three) times daily. 90 tablet 5   loratadine (CLARITIN) 10 MG tablet Take 10 mg by mouth daily as needed for allergies.     Menthol, Topical Analgesic, (BIOFREEZE EX) Apply 1 application topically daily as needed (muscle pain).     milk thistle 175 MG tablet Take 175 mg by mouth daily.     Multiple Vitamin (MULTIVITAMIN) capsule Take 1 capsule by mouth daily.     No current facility-administered medications for this visit.    PHYSICAL EXAMINATION: ECOG PERFORMANCE STATUS: 2 - Symptomatic, <50% confined to bed  Vitals:   12/30/23 0929  BP: (!) 160/60  Pulse: 63  Resp: 16  Temp: (!) 97.5 F (36.4 C)  SpO2: 97%   Wt Readings from Last 3 Encounters:  12/30/23 142 lb 9.6 oz (64.7 kg)  10/28/23 146 lb 7 oz (66.4 kg)  09/20/23 143 lb (64.9 kg)     GENERAL:alert, no distress and comfortable SKIN: skin color, texture, turgor are normal, no rashes or significant lesions EYES: normal, Conjunctiva are pink and non-injected, sclera clear NECK: supple, thyroid normal size, non-tender, without nodularity LYMPH:  no palpable lymphadenopathy in the cervical, axillary  LUNGS: clear to auscultation and percussion with normal breathing effort HEART: regular rate & rhythm and no murmurs and no lower extremity edema ABDOMEN:abdomen soft, non-tender and normal bowel sounds Musculoskeletal:no cyanosis  of digits and no clubbing  NEURO: alert & oriented x 3 with fluent speech, no focal motor/sensory deficits  LABORATORY DATA:  I have reviewed the data as listed    Latest Ref Rng & Units 12/30/2023    9:06 AM 10/28/2023    1:05 PM 09/02/2023   10:06 AM  CBC  WBC 4.0 - 10.5 K/uL 4.9  2.8  2.0   Hemoglobin 12.0 - 15.0 g/dL 86.5  9.7  8.8   Hematocrit 36.0 - 46.0 % 33.0  28.6  25.7   Platelets 150 - 400 K/uL 195  89  143         Latest Ref Rng & Units 12/30/2023    9:06 AM 10/28/2023    1:05 PM 09/02/2023   10:06 AM  CMP  Glucose 70 - 99 mg/dL 782  956  95   BUN 8 - 23 mg/dL 39  34  37   Creatinine 0.44 - 1.00 mg/dL 2.13  0.86  5.78   Sodium 135 - 145 mmol/L 140  145  142   Potassium 3.5 - 5.1 mmol/L 4.0  4.1  4.3   Chloride 98 - 111 mmol/L 109  112  112   CO2 22 - 32 mmol/L 21  24  23    Calcium 8.9 - 10.3 mg/dL 46.9  9.7  9.0   Total Protein 6.5 - 8.1 g/dL 7.2  7.1  6.5   Total Bilirubin 0.0 - 1.2 mg/dL 0.4  0.4  0.4   Alkaline Phos 38 - 126 U/L 172  162  126   AST 15 - 41 U/L 28  22  18    ALT 0 - 44 U/L 21  11  11        RADIOGRAPHIC STUDIES: I have personally reviewed the radiological images as listed and agreed with the findings in the report. No results found.    Orders Placed This Encounter  Procedures   Ambulatory referral to Radiation Oncology    Referral Priority:   Routine    Referral Type:   Consultation    Referral Reason:   Specialty Services Required    Requested Specialty:   Radiation Oncology    Number of Visits Requested:   1   All questions were answered. The patient knows to call the clinic with any problems, questions or concerns. No barriers to learning was detected. The total time spent in the appointment was 30 minutes.     Malachy Mood, MD 12/30/2023

## 2023-12-30 NOTE — Telephone Encounter (Signed)
Spoke to pt's daughter who is unable to make any of the urgent available options we have available. She asked if it would be possible to have pt seen in [place] due to distance and pt needing family for transportation. Per PA AP, pt is able to seek tx in [place]. Inbasket sent to Dr. Mosetta Putt and Breast navigators to send this to [facility] ASAP; referral sent back to [facility] team.

## 2023-12-30 NOTE — Telephone Encounter (Signed)
Reached out to pt who asked to contact her daughter to make appts. Spoke to daughter who asked to c/b since she would need to make sure she or another family member were available to bring pt. Daughter was provided best c/b number and routing to reach my line once confirmation is complete on her end.

## 2023-12-31 LAB — CANCER ANTIGEN 27.29: CA 27.29: 174.4 U/mL — ABNORMAL HIGH (ref 0.0–38.6)

## 2024-01-06 ENCOUNTER — Other Ambulatory Visit: Payer: Self-pay

## 2024-01-06 NOTE — Progress Notes (Signed)
 Received fax from [facility] stating that she is scheduled for radiation on 01/09/2024 @ 945.  Made Dr. Maryalice Smaller and nurse Abbie Abbey aware.

## 2024-01-06 NOTE — Progress Notes (Signed)
 Sent referral packet to Dr. Shelvy Dickens Radiation Oncology [phone]) per Staff Message from Dr. Maryalice Smaller.  Vanda in Radiology will push images over to [facility] for pt's recent diagnostic test.  Fax confirmation received.

## 2024-01-10 ENCOUNTER — Telehealth: Payer: Self-pay | Admitting: Radiation Oncology

## 2024-01-10 NOTE — Telephone Encounter (Signed)
Received medical record request from [facility] care at [facility] for dosimetry records. Request forwarded to dosimetry.

## 2024-01-13 ENCOUNTER — Other Ambulatory Visit: Payer: Self-pay

## 2024-01-20 ENCOUNTER — Ambulatory Visit (INDEPENDENT_AMBULATORY_CARE_PROVIDER_SITE_OTHER): Payer: Medicare FFS

## 2024-01-20 VITALS — Ht 64.5 in | Wt 142.0 lb

## 2024-01-20 DIAGNOSIS — Z0000 Encounter for general adult medical examination without abnormal findings: Secondary | ICD-10-CM | POA: Diagnosis not present

## 2024-01-20 NOTE — Progress Notes (Signed)
 Subjective:   Shirley Wells is a 83 y.o. who presents for a Medicare Wellness preventive visit.  Visit Complete: Virtual I connected with  [name] on 01/20/24 by a audio enabled telemedicine application and verified that I am speaking with the correct person using two identifiers.  Patient Location: Home  Provider Location: Office/Clinic  I discussed the limitations of evaluation and management by telemedicine. The patient expressed understanding and agreed to proceed.  Vital Signs: Because this visit was a virtual/telehealth visit, some criteria may be missing or patient reported. Any vitals not documented were not able to be obtained and vitals that have been documented are patient reported.  VideoDeclined- This patient declined Librarian, academic. Therefore the visit was completed with audio only.  AWV Questionnaire: No: Patient Medicare AWV questionnaire was not completed prior to this visit.  Cardiac Risk Factors include: advanced age (>57men, >56 women);hypertension;dyslipidemia     Objective:    Today's Vitals   01/20/24 1057  Weight: 142 lb (64.4 kg)  Height: 5' 4.5" (1.638 m)   Body mass index is 24 kg/m.     01/20/2024   10:54 AM 01/16/2023   10:39 AM 01/15/2022   10:45 AM 01/15/2021   11:28 AM 12/09/2020    9:48 AM 12/01/2020    1:57 PM 07/31/2017    1:00 AM  Advanced Directives  Does Patient Have a Medical Advance Directive? No No No No No No No  Would patient like information on creating a medical advance directive? No - Patient declined No - Patient declined No - Patient declined Yes (ED - Information included in AVS) No - Patient declined No - Patient declined No - Patient declined    Current Medications (verified) Outpatient Encounter Medications as of 01/20/2024  Medication Sig   acetaminophen (TYLENOL) 650 MG CR tablet Take 1,300 mg by mouth every 8 (eight) hours as needed for pain.   amLODipine (NORVASC) 2.5 MG tablet Take 1  tablet (2.5 mg total) by mouth daily.   aspirin 81 MG tablet Take 81 mg by mouth 2 (two) times a week.   clopidogrel (PLAVIX) 75 MG tablet TAKE 1 TABLET(75 MG) BY MOUTH DAILY   cyclopentolate (CYCLODRYL,CYCLOGYL) 1 % ophthalmic solution Place 1 drop into the left eye once.   elacestrant hydrochloride (ORSERDU) 345 MG tablet Take 1 tablet (345 mg total) by mouth daily. Take with food.   furosemide (LASIX) 20 MG tablet Take 20 mg by mouth daily as needed for fluid.   hydrALAZINE (APRESOLINE) 50 MG tablet Take 1 tablet (50 mg total) by mouth 3 (three) times daily.   loratadine (CLARITIN) 10 MG tablet Take 10 mg by mouth daily as needed for allergies.   Menthol, Topical Analgesic, (BIOFREEZE EX) Apply 1 application topically daily as needed (muscle pain).   milk thistle 175 MG tablet Take 175 mg by mouth daily.   moxifloxacin (VIGAMOX) 0.5 % ophthalmic solution Place 1 drop into the left eye 3 (three) times daily.   Multiple Vitamin (MULTIVITAMIN) capsule Take 1 capsule by mouth daily.   ZIRGAN 0.15 % GEL Place 1 drop into the left eye 5 (five) times daily.   No facility-administered encounter medications on file as of 01/20/2024.    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   Osteoarthritis    Stroke [facility])    Past Surgical History:  Procedure Laterality Date   CHOLECYSTECTOMY  1981   MASTECTOMY W/ SENTINEL NODE BIOPSY  Left 07/30/2017   Procedure: LEFT TOTAL MASTECTOMY WITH AXILLARY SENTINEL LYMPH NODE BIOPSY;  Surgeon: Emelia Loron, MD;  Location: [facility] OR;  Service: General;  Laterality: Left;   Family History  Problem Relation Age of Onset   Breast cancer Mother 21   Arthritis Other    Breast cancer Maternal Aunt    Social History   Socioeconomic History   Marital status: Divorced    Spouse name: Not on file   Number of children: Not on file   Years of education: Not on file   Highest education level: Not on file  Occupational History   Not on file  Tobacco Use   Smoking status: Never   Smokeless tobacco: Never  Vaping Use   Vaping status: Never Used  Substance and Sexual Activity   Alcohol use: No    Alcohol/week: 0.0 standard drinks of alcohol   Drug use: No   Sexual activity: Not on file  Other Topics Concern   Not on file  Social History Narrative   Divorced  >40 yrs, single and lives alone.    Supportive daughters, 1 is a Charity fundraiser at [employer]).    Patient enjoys gardening, dancing and caring for her grand children.   Social Drivers of Corporate investment banker Strain: Low Risk  (01/20/2024)   Overall Financial Resource Strain (CARDIA)    Difficulty of Paying Living Expenses: Not hard at all  Food Insecurity: No Food Insecurity (01/20/2024)   Hunger Vital Sign    Worried About Running Out of Food in the Last Year: Never true    Ran Out of Food in the Last Year: Never true  Transportation Needs: No Transportation Needs (01/20/2024)   PRAPARE - Administrator, Civil Service (Medical): No    Lack of Transportation (Non-Medical): No  Physical Activity: Insufficiently Active (01/20/2024)   Exercise Vital Sign    Days of Exercise per Week: 5 days    Minutes of Exercise per Session: 10 min  Stress: No Stress Concern Present (01/20/2024)   [employer] of Occupational Health - Occupational Stress Questionnaire    Feeling of Stress : Not at all  Social  Connections: Socially Isolated (01/20/2024)   Social Connection and Isolation Panel [NHANES]    Frequency of Communication with Friends and Family: More than three times a week    Frequency of Social Gatherings with Friends and Family: Never    Attends Religious Services: Never    Database administrator or Organizations: No    Attends Banker Meetings: Never    Marital Status: Divorced    Tobacco Counseling - Non Smoker Counseling given: Not Answered    Clinical Intake:Completed 01/20/2024  Pre-visit preparation completed: Yes  Pain : No/denies pain     BMI - recorded: 24 Nutritional Status: BMI of 19-24  Normal Nutritional Risks: None Diabetes: No  How often do you need to  have someone help you when you read instructions, pamphlets, or other written materials from your doctor or pharmacy?: 1 - Never  Interpreter Needed?: No  Information entered by :: Hassell Halim, CMA   Activities of Daily Living Completed 01/20/2024    01/20/2024   11:00 AM  In your present state of health, do you have any difficulty performing the following activities:  Hearing? 0  Vision? 0  Difficulty concentrating or making decisions? 0  Walking or climbing stairs? 0  Dressing or bathing? 0  Doing errands, shopping? 0  Preparing Food and eating ? N  Using the Toilet? N  In the past six months, have you accidently leaked urine? N  Do you have problems with loss of bowel control? N  Managing your Medications? N  Managing your Finances? N  Housekeeping or managing your Housekeeping? N    Patient Care Team: Pincus Sanes, MD as PCP - General (Internal Medicine) Micki Riley, MD as Consulting Physician (Neurology) Emelia Loron, MD as Consulting Physician (General Surgery) Malachy Mood, MD as Consulting Physician (Hematology) Dorothy Puffer, MD as Consulting Physician (Radiation Oncology)  Indicate any recent Medical Services you may have received from other than [facility]  providers in the past year (date may be approximate).     Assessment:   This is a routine wellness examination for Nancyann.  Hearing/Vision screen Hearing Screening - Comments:: Denies hearing difficulties   Vision Screening - Comments:: Wears reading glasses - up to date with routine eye exams with [facility] in [place], [place]   Goals Addressed               This Visit's Progress     Patient Stated (pt-stated)        Patient stated she wants to continue tx for Cancer and wants to "beat" the Cancer.       Depression Screen Completed 01/20/2024    01/20/2024   11:06 AM 03/25/2023    1:41 PM 01/16/2023   10:38 AM 09/21/2022    2:13 PM 01/15/2022   11:01 AM 03/14/2020   11:08 AM 02/03/2019   11:14 AM  PHQ 2/9 Scores  PHQ - 2 Score 0 0 0 0 0 0 0  PHQ- 9 Score  0         Fall Risk Completed 01/20/2024    01/20/2024   11:00 AM 03/25/2023    1:41 PM 01/16/2023   10:41 AM 09/21/2022    2:13 PM 01/15/2022   10:46 AM  Fall Risk   Falls in the past year? 0 0 0 0 0  Number falls in past yr: 0 0 0 0 0  Injury with Fall? 0 0 0 0 0  Risk for fall due to : No Fall Risks No Fall Risks No Fall Risks No Fall Risks No Fall Risks  Follow up Falls prevention discussed;Falls evaluation completed Falls evaluation completed Falls prevention discussed Falls evaluation completed Falls evaluation completed    MEDICARE RISK AT HOME: Completed on 01/20/2024 Medicare Risk at Home Any stairs in or around the home?: Yes (in the basement but don't use often) If so, are there any without handrails?: No Home free of loose throw rugs in walkways, pet beds, electrical cords, etc?: Yes Adequate lighting in your home to reduce risk of falls?: Yes Life alert?: No Use of a cane, walker or w/c?: No Grab bars in the bathroom?: Yes Shower chair or bench in shower?: Yes Elevated toilet seat or a handicapped toilet?:  Yes  TIMED UP AND GO:  Was the test performed?  No  Cognitive Function: 6CIT completed         01/20/2024   11:02 AM 01/16/2023   10:40 AM  6CIT Screen  What Year? 0 points 0 points  What month? 0 points 0 points  What time? 0 points 0 points  Count back from 20 0 points 0 points  Months in reverse 0 points 0 points  Repeat phrase 0 points 0 points  Total Score 0 points 0 points    Immunizations Immunization History  Administered Date(s) Administered   Fluad Trivalent(High Dose 65+) 09/20/2023   PFIZER(Purple Top)SARS-COV-2 Vaccination 09/05/2020, 10/28/2020   PNEUMOCOCCAL CONJUGATE-20 09/20/2023    Screening Tests Health Maintenance  Topic Date Due   COVID-19 Vaccine (3 - Pfizer risk series) 11/25/2020   DEXA SCAN  09/12/2022   DTaP/Tdap/Td (1 - Tdap) 09/09/2024 (Originally 07/12/1960)   Medicare Annual Wellness (AWV)  01/19/2025   HPV VACCINES  Aged Out   Pneumonia Vaccine 57+ Years old  Discontinued   Zoster Vaccines- Shingrix  Discontinued    Health Maintenance  Health Maintenance Due  Topic Date Due   COVID-19 Vaccine (3 - Pfizer risk series) 11/25/2020   DEXA SCAN  09/12/2022   Health Maintenance Items Addressed:01/20/2024.  Pt declined the COVID vaccine and she is currently being monitored by her Oncologist for her bones/muscles while undergoing treatment.   Additional Screening:  Vision Screening: Recommended annual ophthalmology exams for early detection of glaucoma and other disorders of the eye. Sees [facility] for annual eye exams.  Wears eyeglasses for reading only.  Dental Screening: Recommended annual dental exams for proper oral hygiene  Community Resource Referral / Chronic Care Management: CRR required this visit?  No   CCM required this visit?  No     Plan:     I have personally reviewed and noted the following in the patient's chart:   Medical and social history Use of alcohol, tobacco or illicit drugs  Current medications and supplements including opioid prescriptions. Patient is not currently taking opioid  prescriptions. Functional ability and status Nutritional status Physical activity Advanced directives List of other physicians Hospitalizations, surgeries, and ER visits in previous 12 months Vitals Screenings to include cognitive, depression, and falls Referrals and appointments  In addition, I have reviewed and discussed with patient certain preventive protocols, quality metrics, and best practice recommendations. A written personalized care plan for preventive services as well as general preventive health recommendations were provided to patient.     Darreld Mclean, CMA   01/20/2024   After Visit Summary: (Mail) Due to this being a telephonic visit, the after visit summary with patients personalized plan was offered to patient via mail   Notes: Patient is currently being monitored by her Oncologist during treatment.

## 2024-01-20 NOTE — Patient Instructions (Addendum)
 Ms. Shirley Wells , Thank you for taking time to come for your Medicare Wellness Visit. I appreciate your ongoing commitment to your health goals. Please review the following plan we discussed and let me know if I can assist you in the future.   Referrals/Orders/Follow-Ups/Clinician Recommendations: Aim for 30 minutes of exercise or brisk walking, 6-8 glasses of water, and 5 servings of fruits and vegetables each day. Patient declines the COVID vaccine.  Patient is currently being monitored by Oncologist for treatment and will obtain a DEXA scan by him.    This is a list of the screening recommended for you and due dates:  Health Maintenance  Topic Date Due   COVID-19 Vaccine (3 - Pfizer risk series) 11/25/2020   DEXA scan (bone density measurement)  09/12/2022   DTaP/Tdap/Td vaccine (1 - Tdap) 09/09/2024*   Medicare Annual Wellness Visit  01/19/2025   HPV Vaccine  Aged Out   Pneumonia Vaccine  Discontinued   Zoster (Shingles) Vaccine  Discontinued  *Topic was postponed. The date shown is not the original due date.    Advanced directives: (Declined) Advance directive discussed with you today. Even though you declined this today, please call our office should you change your mind, and we can give you the proper paperwork for you to fill out.  Next Medicare Annual Wellness Visit scheduled for next year: Yes - 02/2025

## 2024-01-24 ENCOUNTER — Other Ambulatory Visit: Payer: Self-pay

## 2024-01-24 MED ORDER — ELACESTRANT HYDROCHLORIDE 345 MG PO TABS: 345.0000 mg | ORAL_TABLET | Freq: Every day | ORAL | 1 refills | Status: DC

## 2024-03-01 NOTE — Assessment & Plan Note (Signed)
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 S/p left total mastectomy. She declined antiestrogen therapy due to concerns of side effects. She lost follow up after 07/2017.   -After 6 months of left hip and ribcage pain she was found to have diffuse bone metastasis on 11/28/20 PET. This was confirmed on 12/09/20 bone biopsy --she started first-line Anastrozole in 11/2020 and Ibrance on 01/09/21, not very compliant with Ibrance  -progressed on PET 10/03/2022 -changed her tx to fulvestrant injection and Verzenio. 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

## 2024-03-02 ENCOUNTER — Encounter: Payer: Self-pay | Admitting: Hematology

## 2024-03-02 ENCOUNTER — Inpatient Hospital Stay (HOSPITAL_BASED_OUTPATIENT_CLINIC_OR_DEPARTMENT_OTHER): Payer: Medicare FFS | Admitting: Hematology

## 2024-03-02 ENCOUNTER — Inpatient Hospital Stay: Payer: Medicare FFS | Attending: Hematology

## 2024-03-02 VITALS — BP 151/60 | HR 75 | Temp 98.1°F | Resp 18 | Ht 64.5 in | Wt 140.4 lb

## 2024-03-02 DIAGNOSIS — N184 Chronic kidney disease, stage 4 (severe): Secondary | ICD-10-CM | POA: Diagnosis not present

## 2024-03-02 DIAGNOSIS — C50212 Malignant neoplasm of upper-inner quadrant of left female breast: Secondary | ICD-10-CM | POA: Diagnosis not present

## 2024-03-02 DIAGNOSIS — Z170 Estrogen receptor positive status [ER+]: Secondary | ICD-10-CM

## 2024-03-02 DIAGNOSIS — I129 Hypertensive chronic kidney disease with stage 1 through stage 4 chronic kidney disease, or unspecified chronic kidney disease: Secondary | ICD-10-CM | POA: Insufficient documentation

## 2024-03-02 DIAGNOSIS — Z9012 Acquired absence of left breast and nipple: Secondary | ICD-10-CM | POA: Insufficient documentation

## 2024-03-02 DIAGNOSIS — Z1732 Human epidermal growth factor receptor 2 negative status: Secondary | ICD-10-CM | POA: Insufficient documentation

## 2024-03-02 DIAGNOSIS — Z79811 Long term (current) use of aromatase inhibitors: Secondary | ICD-10-CM | POA: Insufficient documentation

## 2024-03-02 DIAGNOSIS — Z1721 Progesterone receptor positive status: Secondary | ICD-10-CM | POA: Insufficient documentation

## 2024-03-02 DIAGNOSIS — C7951 Secondary malignant neoplasm of bone: Secondary | ICD-10-CM | POA: Insufficient documentation

## 2024-03-02 DIAGNOSIS — Z79899 Other long term (current) drug therapy: Secondary | ICD-10-CM | POA: Insufficient documentation

## 2024-03-02 LAB — CBC WITH DIFFERENTIAL/PLATELET
Abs Immature Granulocytes: 0.03 10*3/uL (ref 0.00–0.07)
Basophils Absolute: 0.1 10*3/uL (ref 0.0–0.1)
Basophils Relative: 1 %
Eosinophils Absolute: 0.5 10*3/uL (ref 0.0–0.5)
Eosinophils Relative: 7 %
HCT: 36 % (ref 36.0–46.0)
Hemoglobin: 11.9 g/dL — ABNORMAL LOW (ref 12.0–15.0)
Immature Granulocytes: 1 %
Lymphocytes Relative: 24 %
Lymphs Abs: 1.6 10*3/uL (ref 0.7–4.0)
MCH: 36.6 pg — ABNORMAL HIGH (ref 26.0–34.0)
MCHC: 33.1 g/dL (ref 30.0–36.0)
MCV: 110.8 fL — ABNORMAL HIGH (ref 80.0–100.0)
Monocytes Absolute: 0.6 10*3/uL (ref 0.1–1.0)
Monocytes Relative: 8 %
Neutro Abs: 3.9 10*3/uL (ref 1.7–7.7)
Neutrophils Relative %: 59 %
Platelets: 204 10*3/uL (ref 150–400)
RBC: 3.25 MIL/uL — ABNORMAL LOW (ref 3.87–5.11)
RDW: 14 % (ref 11.5–15.5)
WBC: 6.6 10*3/uL (ref 4.0–10.5)
nRBC: 0 % (ref 0.0–0.2)

## 2024-03-02 LAB — COMPREHENSIVE METABOLIC PANEL WITH GFR
ALT: 22 U/L (ref 0–44)
AST: 32 U/L (ref 15–41)
Albumin: 3.8 g/dL (ref 3.5–5.0)
Alkaline Phosphatase: 225 U/L — ABNORMAL HIGH (ref 38–126)
Anion gap: 9 (ref 5–15)
BUN: 43 mg/dL — ABNORMAL HIGH (ref 8–23)
CO2: 27 mmol/L (ref 22–32)
Calcium: 9.9 mg/dL (ref 8.9–10.3)
Chloride: 107 mmol/L (ref 98–111)
Creatinine, Ser: 1.82 mg/dL — ABNORMAL HIGH (ref 0.44–1.00)
GFR, Estimated: 27 mL/min — ABNORMAL LOW (ref >60)
Glucose, Bld: 117 mg/dL — ABNORMAL HIGH (ref 70–99)
Potassium: 4.8 mmol/L (ref 3.5–5.1)
Sodium: 143 mmol/L (ref 135–145)
Total Bilirubin: 0.4 mg/dL (ref 0.0–1.2)
Total Protein: 7.4 g/dL (ref 6.5–8.1)

## 2024-03-02 NOTE — Progress Notes (Signed)
 [facility]   Telephone:([phone] Fax:([phone]   Clinic Follow up Note   Patient Care Team: Pincus Sanes, MD as PCP - General (Internal Medicine) Micki Riley, MD as Consulting Physician (Neurology) Emelia Loron, MD as Consulting Physician (General Surgery) Malachy Mood, MD as Consulting Physician (Hematology) Dorothy Puffer, MD as Consulting Physician (Radiation Oncology)  Date of Service:  03/02/2024  CHIEF COMPLAINT: f/u of metastatic cancer  CURRENT THERAPY:  Elacestrant  Oncology History   Malignant neoplasm of upper-inner quadrant of left breast in female, estrogen receptor positive (HCC) -pT2N0, Stage 1b, ER/PR+/HER2-, Grade 2, 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 S/p left total mastectomy. She declined antiestrogen therapy due to concerns of side effects. She lost follow up after 07/2017.   -After 6 months of left hip and ribcage pain she was found to have diffuse bone metastasis on 11/28/20 PET. This was confirmed on 12/09/20 bone biopsy --she started first-line Anastrozole in 11/2020 and Ibrance on 01/09/21, not very compliant with Ibrance  -progressed on PET 10/03/2022 -changed her tx to fulvestrant injection and Verzenio. 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 Current medication regimen started in January. Blood counts improved since discontinuing Ibrance; white count normal, hemoglobin 11.9, platelet counts normal. Decided against radiation therapy due to  perceived risks. Mild back pain with prolonged standing, not requiring Tylenol. Scheduled for PET scan at the end of May to assess treatment response. Tumor markers pending; lower levels would indicate positive response. - Schedule PET scan for the end of May - Continue current medication regimen - Monitor blood counts, kidney, and liver function - Assess tumor markers - Discuss PET scan results in follow-up visit  CKD stage III-IV Advised to maintain adequate hydration to support kidney function. - Encourage adequate hydration  Hypertension On hydralazine and amlodipine. Occasionally takes aspirin but not Lasix, as she has not experienced significant swelling. - Continue hydralazine and amlodipine - Monitor blood pressure  Cataracts Considering cataract surgery. Current hormonal therapy does not impact healing or increase infection risk, unlike Ibrance, which she is no longer taking. - Proceed with cataract surgery if recommended by the ophthalmologist      Plan -Lab reviewed, her cytopenias improved since she came off of Ibrance -She is tolerating elacestrant well, will continue -Follow-up in 2 months virtually, with lab and PET scan a week before   SUMMARY OF ONCOLOGIC HISTORY: Oncology History Overview Note   Cancer Staging  Malignant neoplasm of upper-inner quadrant of left breast in female, estrogen receptor positive (HCC) Staging form: Breast, AJCC 8th Edition - Clinical stage from 06/18/2017: Stage IB (cT2, cN0, cM0, G2, ER+, PR+, HER2-) - Signed by Malachy Mood, MD on 06/24/2017 Histologic grading system: 3 grade system - Pathologic stage from 07/30/2017: Stage IA (pT2, pN0, cM0, G2, ER+, PR+, HER2-, Oncotype DX score: 23) - Signed by Malachy Mood, MD on 08/23/2017 Neoadjuvant therapy: No Nuclear grade: G2 Multigene prognostic tests performed: Oncotype DX Recurrence score range: Greater than or equal to 11 Histologic grading system: 3  grade system Residual tumor (R): R0 -  None Laterality: Left     Malignant neoplasm of upper-inner quadrant of left breast in female, estrogen receptor positive (HCC)  06/17/2017 Mammogram   [place] and [facility] diagnostic Breast Tomo Bilateral  IMPRESSION: 1. 3.4 palpable mass in the 10 o'clock position of the left breast is highly suspicious for malignancy.  2. No evidence of malignancy in the right breast.  3. Ultrasound of the left axilla shows normal sized axillary lymph nodes.   06/18/2017 Initial Biopsy   Diagnosis 06/18/17 Breast, left, needle core biopsy, 10:00 o'clock - INVASIVE MAMMARY CARCINOMA. At least G2  The malignant cells are negative for E-Cadherin, supporting a lobular phenotype.   06/18/2017 Initial Diagnosis   Malignant neoplasm of upper-inner quadrant of left breast in female, estrogen receptor positive (HCC)   06/18/2017 Receptors her2   ER 95%+, PR 25%+, both strong staining  HER2- Ki67 30%    07/30/2017 Surgery   LEFT TOTAL MASTECTOMY WITH AXILLARY SENTINEL LYMPH NODE BIOPSY by Dr. Dwain Sarna on 07/30/17   07/30/2017 Pathology Results   Diagnosis 07/30/17 1. Breast, simple mastectomy, Left Total - INVASIVE LOBULAR CARCINOMA, GRADE 2, SPANNING 3.5 CM. - LOBULAR CARCINOMA IN SITU. - INVASIVE CARCINOMA IS BROADLY 0.1 CM FROM POSTERIOR MARGIN. - PERINEURAL INVASION PRESENT. - SEE ONCOLOGY TABLE. 2. Lymph node, sentinel, biopsy, Left Axillary - ONE OF ONE LYMPH NODES NEGATIVE FOR CARCINOMA (0/1). 3. Lymph node, sentinel, biopsy, Left - ONE OF ONE LYMPH NODES NEGATIVE FOR CARCINOMA (0/1).   07/30/2017 Oncotype testing   Oncotype 07/30/17 Recurrence score is 23, an intermediate risk With a 10-year risk of recurrence at 15% with tamoxifen alone.     11/11/2020 Imaging   MRI Lumbar Spine  IMPRESSION: Diffuse osseous metastases. Chronic inferior L2 endplate deformity with mild height loss.   Likely late subacute pathologic fracture deformities involving the superior L1 endplate and right L3 lamina.    Multilevel spondylosis. Moderate to severe spinal canal and neural foraminal narrowing at the L3-4 and L4-5 levels.   Mild to moderate spinal canal and neural foraminal narrowing at the L5-S1 level.   11/28/2020 PET scan   IMPRESSION: 1. Widespread and diffuse hypermetabolic bony metastases. 2. No evidence for soft tissue metastases in the neck, chest, abdomen, or pelvis. 3.  Aortic Atherosclerois (ICD10-170.0)     11/29/2020 -  Chemotherapy   Zometa q72months starting 11/29/20. Held after first dose. Given her CKD will only give when she has hypercalcemia and may switch to Prolia.     12/04/2020 Imaging   MRI Brain  IMPRESSION: No evidence of intracranial metastatic disease.   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   Mild chronic microvascular ischemic changes. Small chronic right cerebellar infarct.     12/07/2020 - 12/21/2020 Radiation Therapy   palliative RT to left hip with Dr Mitzi Hansen on 12/07/20-12/21/20   12/09/2020 Pathology Results   FINAL MICROSCOPIC DIAGNOSIS:   A. BONE, BIOPSY:  - Metastatic carcinoma.  - See comment.   COMMENT:  The morphology is compatible with metastatic lobular breast carcinoma.  Estrogen receptor, progesterone receptor and HER2/neu will be performed.   ADDENDUM:   PROGNOSTIC INDICATOR RESULTS:   Immunohistochemical and morphometric analysis performed manually   The tumor cells are EQUIVOCAL for Her2 (2+).  Her2 by FISH will be performed and the results will be reported  separately.   Estrogen Receptor:       POSITIVE, 50%, MODERATE-STRONG STAINING  Progesterone Receptor:   POSITIVE, 15%, MODERATE-STRONG  STAINING    11/2020 -  Anti-estrogen oral therapy   First-line Anti-estrogen therapy Anastrozole 1mg  daily starting in 11/2020 and Ibrance starting in 12/2020    01/09/2021 -  Chemotherapy   Ibrance 3 weeks on/1 weeks off starting beginning of 01/09/21. Reduced to 75mg  starting with C2 (02/13/21) due to cytopenia and  fatigue. Reduced to 75mg  2 weeks on/1 week off starting C3 on 03/13/21)    03/31/2021 PET scan   IMPRESSION: 1. Dramatic reduction in metabolic activity throughout the skeleton. Metabolic activity of diffuse sclerotic skeletal lesions is now essentially at background blood pool level. No focal metabolic activity. 2. Persistent diffuse confluent sclerotic skeletal metastasis unchanged. 3. No evidence of soft tissue breast cancer metastasis.   10/13/2021 PET scan   IMPRESSION: 1. Widespread osseous metastatic disease again noted with areas of minimally increased metabolic activity within the sternal manubrium and L2 vertebral body. 2. No evidence of extra osseous metastatic disease.      Discussed the use of AI scribe software for clinical note transcription with the patient, who gave verbal consent to proceed.  History of Present Illness   The patient, an 83 year old female with metastatic breast cancer, presents for a follow-up visit. She reports feeling 'pretty good' and has been adhering to her medication regimen without any issues. She has been taking her medication with food as advised. She has not missed any doses and has not experienced any side effects from the medication.  The patient was previously on Ibrance, but it was discontinued due to low blood counts. Since discontinuing Ibrance, her blood counts have returned to normal. She has some kidney problems and has been advised to hydrate adequately.  The patient was offered radiation therapy but decided against it due to perceived risks. She reports back pain, but it is not severe and only occurs when she is on her feet for extended periods. She has not needed to take any pain medication for it in the past week.  The patient also has cataracts and is hoping to have surgery soon. She has been experiencing difficulty reading due to her vision problems.         All other systems were reviewed with the patient and are  negative.  MEDICAL HISTORY:  Past Medical History:  Diagnosis Date   CHICKENPOX, HX OF 03/21/2010   Qualifier: Diagnosis of  By: Charlsie Quest RMA, Lucy     CVA (cerebral infarction) 02/2010   no residual deficits, a/w mid basal art stenosis, declined IC stent as rec by IR/neuro   Dyslipidemia    intol of statin due to >3x increase LFTs   GERD (gastroesophageal reflux disease)    hx   Hypertension, essential    Malignant neoplasm of upper-inner quadrant of left breast in female, estrogen receptor positive (HCC) 06/20/2017   Osteoarthritis    Stroke [facility])     SURGICAL HISTORY: Past Surgical History:  Procedure Laterality Date   CHOLECYSTECTOMY  1981   MASTECTOMY W/ SENTINEL NODE BIOPSY Left 07/30/2017   Procedure: LEFT TOTAL MASTECTOMY WITH AXILLARY SENTINEL LYMPH NODE BIOPSY;  Surgeon: Emelia Loron, MD;  Location: [facility] OR;  Service: General;  Laterality: Left;    I have reviewed the social history and family history with the patient and they are unchanged from previous note.  ALLERGIES:  is allergic to influenza vaccines, pneumococcal vaccines, simvastatin, tetanus toxoids, and zoster vaccine live.  MEDICATIONS:  Current Outpatient Medications  Medication Sig Dispense Refill   acetaminophen (TYLENOL) 650 MG CR tablet  Take 1,300 mg by mouth every 8 (eight) hours as needed for pain.     amLODipine (NORVASC) 2.5 MG tablet Take 1 tablet (2.5 mg total) by mouth daily. 90 tablet 3   aspirin 81 MG tablet Take 81 mg by mouth 2 (two) times a week.     clopidogrel (PLAVIX) 75 MG tablet TAKE 1 TABLET(75 MG) BY MOUTH DAILY 90 tablet 1   cyclopentolate (CYCLODRYL,CYCLOGYL) 1 % ophthalmic solution Place 1 drop into the left eye once.     elacestrant hydrochloride (ORSERDU) 345 MG tablet Take 1 tablet (345 mg total) by mouth daily. Take with food. 30 tablet 1   hydrALAZINE (APRESOLINE) 50 MG tablet Take 1 tablet (50 mg total) by mouth 3 (three) times daily. 90 tablet 5   loratadine (CLARITIN) 10 MG  tablet Take 10 mg by mouth daily as needed for allergies.     Menthol, Topical Analgesic, (BIOFREEZE EX) Apply 1 application topically daily as needed (muscle pain).     milk thistle 175 MG tablet Take 175 mg by mouth daily.     moxifloxacin (VIGAMOX) 0.5 % ophthalmic solution Place 1 drop into the left eye 3 (three) times daily.     Multiple Vitamin (MULTIVITAMIN) capsule Take 1 capsule by mouth daily.     ZIRGAN 0.15 % GEL Place 1 drop into the left eye 5 (five) times daily.     No current facility-administered medications for this visit.    PHYSICAL EXAMINATION: ECOG PERFORMANCE STATUS: 1 - Symptomatic but completely ambulatory  Vitals:   03/02/24 1045 03/02/24 1046  BP: (!) 172/67 (!) 151/60  Pulse: 82 75  Resp: 18   Temp: 98.1 F (36.7 C)   SpO2: 100%    Wt Readings from Last 3 Encounters:  03/02/24 140 lb 6.4 oz (63.7 kg)  01/20/24 142 lb (64.4 kg)  12/30/23 142 lb 9.6 oz (64.7 kg)     GENERAL:alert, no distress and comfortable SKIN: skin color, texture, turgor are normal, no rashes or significant lesions EYES: normal, Conjunctiva are pink and non-injected, sclera clear NECK: supple, thyroid normal size, non-tender, without nodularity LYMPH:  no palpable lymphadenopathy in the cervical, axillary  LUNGS: clear to auscultation and percussion with normal breathing effort HEART: regular rate & rhythm and no murmurs and no lower extremity edema ABDOMEN:abdomen soft, non-tender and normal bowel sounds Musculoskeletal:no cyanosis of digits and no clubbing  NEURO: alert & oriented x 3 with fluent speech, no focal motor/sensory deficits   LABORATORY DATA:  I have reviewed the data as listed    Latest Ref Rng & Units 03/02/2024   10:16 AM 12/30/2023    9:06 AM 10/28/2023    1:05 PM  CBC  WBC 4.0 - 10.5 K/uL 6.6  4.9  2.8   Hemoglobin 12.0 - 15.0 g/dL 16.1  09.6  9.7   Hematocrit 36.0 - 46.0 % 36.0  33.0  28.6   Platelets 150 - 400 K/uL 204  195  89         Latest Ref  Rng & Units 03/02/2024   10:16 AM 12/30/2023    9:06 AM 10/28/2023    1:05 PM  CMP  Glucose 70 - 99 mg/dL 045  409  811   BUN 8 - 23 mg/dL 43  39  34   Creatinine 0.44 - 1.00 mg/dL 9.14  7.82  9.56   Sodium 135 - 145 mmol/L 143  140  145   Potassium 3.5 - 5.1 mmol/L 4.8  4.0  4.1   Chloride 98 - 111 mmol/L 107  109  112   CO2 22 - 32 mmol/L 27  21  24    Calcium 8.9 - 10.3 mg/dL 9.9  16.1  9.7   Total Protein 6.5 - 8.1 g/dL 7.4  7.2  7.1   Total Bilirubin 0.0 - 1.2 mg/dL 0.4  0.4  0.4   Alkaline Phos 38 - 126 U/L 225  172  162   AST 15 - 41 U/L 32  28  22   ALT 0 - 44 U/L 22  21  11        RADIOGRAPHIC STUDIES: I have personally reviewed the radiological images as listed and agreed with the findings in the report. No results found.    Orders Placed This Encounter  Procedures   NM PET Image Restag (PS) Skull Base To Thigh    Standing Status:   Future    Expected Date:   04/24/2024    Expiration Date:   03/02/2025    If indicated for the ordered procedure, I authorize the administration of a radiopharmaceutical per Radiology protocol:   Yes    Preferred imaging location?:   [facility]   All questions were answered. The patient knows to call the clinic with any problems, questions or concerns. No barriers to learning was detected. The total time spent in the appointment was 25 minutes.     Malachy Mood, MD 03/02/2024

## 2024-03-03 LAB — CANCER ANTIGEN 27.29: CA 27.29: 203.6 U/mL — ABNORMAL HIGH (ref 0.0–38.6)

## 2024-03-20 ENCOUNTER — Other Ambulatory Visit: Payer: Self-pay | Admitting: Internal Medicine

## 2024-03-22 ENCOUNTER — Encounter: Payer: Self-pay | Admitting: Internal Medicine

## 2024-03-22 NOTE — Progress Notes (Unsigned)
 Subjective:    Patient ID: Shirley Wells, female    DOB: 1941-01-18, 83 y.o.   MRN: [id number]     HPI Shirley Wells is here for follow up of her chronic medical problems.  BP usually better controlled at home - has not been taking her hydralazine  in the middle of the day.  She was hoping she could remain off of it and not needed.  She has noticed that when she does not take the middle of the day dose her blood pressure is higher.   Medications and allergies reviewed with patient and updated if appropriate.  Current Outpatient Medications on File Prior to Visit  Medication Sig Dispense Refill   acetaminophen  (TYLENOL ) 650 MG CR tablet Take 1,300 mg by mouth every 8 (eight) hours as needed for pain.     amLODipine  (NORVASC ) 2.5 MG tablet Take 1 tablet (2.5 mg total) by mouth daily. 90 tablet 3   clopidogrel  (PLAVIX ) 75 MG tablet TAKE 1 TABLET(75 MG) BY MOUTH DAILY 90 tablet 1   cyclopentolate (CYCLODRYL,CYCLOGYL) 1 % ophthalmic solution Place 1 drop into the left eye once.     elacestrant  hydrochloride (ORSERDU ) 345 MG tablet Take 1 tablet (345 mg total) by mouth daily. Take with food. 30 tablet 1   erythromycin ophthalmic ointment      hydrALAZINE  (APRESOLINE ) 50 MG tablet Take 1 tablet (50 mg total) by mouth 3 (three) times daily. 90 tablet 5   loratadine (CLARITIN) 10 MG tablet Take 10 mg by mouth daily as needed for allergies.     Menthol, Topical Analgesic, (BIOFREEZE EX) Apply 1 application topically daily as needed (muscle pain).     milk thistle 175 MG tablet Take 175 mg by mouth daily.     moxifloxacin (VIGAMOX) 0.5 % ophthalmic solution Place 1 drop into the left eye 3 (three) times daily.     Multiple Vitamin (MULTIVITAMIN) capsule Take 1 capsule by mouth daily.     prednisoLONE acetate (PRED FORTE) 1 % ophthalmic suspension SHAKE LIQUID AND INSTILL 1 DROP IN LEFT EYE TWICE DAILY FOR 2 WEEKS THEN STOP     ZIRGAN 0.15 % GEL Place 1 drop into the left eye 5 (five) times daily.      aspirin  81 MG tablet Take 81 mg by mouth 2 (two) times a week. (Patient not taking: Reported on 03/23/2024)     No current facility-administered medications on file prior to visit.     Review of Systems  Constitutional:  Negative for fever.  Respiratory:  Negative for cough, shortness of breath and wheezing.   Cardiovascular:  Positive for leg swelling. Negative for chest pain and palpitations.  Neurological:  Negative for light-headedness and headaches (occ pressure at times).       Objective:   Vitals:   03/23/24 1303  BP: (!) 150/80  Pulse: 67  Temp: 98.1 F (36.7 C)  SpO2: 97%   BP Readings from Last 3 Encounters:  03/23/24 (!) 150/80  03/02/24 (!) 151/60  12/30/23 (!) 160/60   Wt Readings from Last 3 Encounters:  03/23/24 140 lb (63.5 kg)  03/02/24 140 lb 6.4 oz (63.7 kg)  01/20/24 142 lb (64.4 kg)   Body mass index is 23.66 kg/m.    Physical Exam Constitutional:      General: She is not in acute distress.    Appearance: Normal appearance.  HENT:     Head: Normocephalic and atraumatic.  Eyes:     Conjunctiva/sclera: Conjunctivae normal.  Cardiovascular:     Rate and Rhythm: Normal rate and regular rhythm.     Heart sounds: Normal heart sounds.  Pulmonary:     Effort: Pulmonary effort is normal. No respiratory distress.     Breath sounds: Normal breath sounds. No wheezing.  Musculoskeletal:     Cervical back: Neck supple.     Right lower leg: Edema (Mild edema, nonpitting) present.     Left lower leg: Edema (Mild 1+ pitting edema-left lower extremity is chronically larger than right) present.  Lymphadenopathy:     Cervical: No cervical adenopathy.  Skin:    General: Skin is warm and dry.     Findings: No rash.  Neurological:     Mental Status: She is alert. Mental status is at baseline.  Psychiatric:        Mood and Affect: Mood normal.        Behavior: Behavior normal.        Lab Results  Component Value Date   WBC 6.6 03/02/2024   HGB 11.9  (L) 03/02/2024   HCT 36.0 03/02/2024   PLT 204 03/02/2024   GLUCOSE 117 (H) 03/02/2024   CHOL 416 (H) 12/30/2023   TRIG 307 (H) 12/30/2023   HDL 65 12/30/2023   LDLDIRECT 58.0 09/12/2020   LDLCALC 290 (H) 12/30/2023   ALT 22 03/02/2024   AST 32 03/02/2024   NA 143 03/02/2024   K 4.8 03/02/2024   CL 107 03/02/2024   CREATININE 1.82 (H) 03/02/2024   BUN 43 (H) 03/02/2024   CO2 27 03/02/2024   TSH 1.67 11/02/2016   INR 1.1 12/09/2020   HGBA1C 5.1 03/19/2022   HGBA1C 5.1 03/19/2022   HGBA1C 5.1 (A) 03/19/2022   HGBA1C 5.1 03/19/2022     Assessment & Plan:    See Problem List for Assessment and Plan of chronic medical problems.

## 2024-03-22 NOTE — Patient Instructions (Addendum)
      Blood work was ordered.       Medications changes include :   None      Return in about 6 months (around 09/22/2024) for follow up.

## 2024-03-23 ENCOUNTER — Ambulatory Visit (INDEPENDENT_AMBULATORY_CARE_PROVIDER_SITE_OTHER): Payer: Medicare FFS | Admitting: Internal Medicine

## 2024-03-23 VITALS — BP 142/76 | HR 67 | Temp 98.1°F | Ht 64.5 in | Wt 140.0 lb

## 2024-03-23 DIAGNOSIS — R7303 Prediabetes: Secondary | ICD-10-CM | POA: Diagnosis not present

## 2024-03-23 DIAGNOSIS — N1832 Chronic kidney disease, stage 3b: Secondary | ICD-10-CM

## 2024-03-23 DIAGNOSIS — R600 Localized edema: Secondary | ICD-10-CM

## 2024-03-23 DIAGNOSIS — I10 Essential (primary) hypertension: Secondary | ICD-10-CM

## 2024-03-23 DIAGNOSIS — Z8673 Personal history of transient ischemic attack (TIA), and cerebral infarction without residual deficits: Secondary | ICD-10-CM

## 2024-03-23 MED ORDER — CLOPIDOGREL BISULFATE 75 MG PO TABS: ORAL_TABLET | ORAL | 1 refills | Status: AC

## 2024-03-23 MED ORDER — HYDRALAZINE HCL 50 MG PO TABS: 50.0000 mg | ORAL_TABLET | Freq: Three times a day (TID) | ORAL | 5 refills | Status: DC

## 2024-03-23 MED ORDER — AMLODIPINE BESYLATE 2.5 MG PO TABS: 2.5000 mg | ORAL_TABLET | Freq: Every day | ORAL | 3 refills | Status: AC

## 2024-03-23 NOTE — Assessment & Plan Note (Signed)
 Chronic Lab Results  Component Value Date   HGBA1C 5.1 03/19/2022   HGBA1C 5.1 03/19/2022   HGBA1C 5.1 (A) 03/19/2022   HGBA1C 5.1 03/19/2022   Will hold off on blood work since she does have blood work done by oncology-overall glucose has been reasonably controlled in prediabetic range

## 2024-03-23 NOTE — Assessment & Plan Note (Addendum)
 Chronic BP not ideally controlled Continue amlodipine  2.5 mg daily Continue hydralazine  50 mg 3 times daily-has only been taking twice a day-advise she needs to take this 3 times a day Stressed good BP control to preserve her kidney function Monitor BP at home-discussed goal of less than 140/90

## 2024-03-23 NOTE — Assessment & Plan Note (Addendum)
 Chronic Progression over the years, but stable  BP not ideally controlled Continue amlodipine  2.5 mg daily Continue hydralazine  50 mg 3 times daily-has only been taking twice a day and advised she needs to take this 3 times a day to prevent kidney damage Monitor BP at home-discussed goal of less than 140/90-ideally less than 130/80

## 2024-03-23 NOTE — Assessment & Plan Note (Addendum)
 Acute Has intermittent lower extremity edema-typically was traveling Last week and she [place] the [place] and did a lot of sitting Took lasix  yesterday afternoon Advised not to take any further Lasix  given CKD Elevate legs more Continue walking inside the house If the leg edema does not improve advised to let me know

## 2024-03-23 NOTE — Assessment & Plan Note (Addendum)
 Chronic H/o cva Continue plavix  75 mg daily BP not ideally controlled-will take hydralazine  3 times a day-advised monitoring BP at home Deferred statin

## 2024-03-31 ENCOUNTER — Other Ambulatory Visit: Payer: Self-pay

## 2024-03-31 MED ORDER — ELACESTRANT HYDROCHLORIDE 345 MG PO TABS: 345.0000 mg | ORAL_TABLET | Freq: Every day | ORAL | 1 refills | Status: DC

## 2024-04-06 ENCOUNTER — Ambulatory Visit (INDEPENDENT_AMBULATORY_CARE_PROVIDER_SITE_OTHER): Admitting: Internal Medicine

## 2024-04-06 ENCOUNTER — Ambulatory Visit: Payer: Self-pay

## 2024-04-06 ENCOUNTER — Encounter: Payer: Self-pay | Admitting: Internal Medicine

## 2024-04-06 VITALS — BP 140/60 | HR 59 | Temp 98.1°F | Ht 64.5 in | Wt 137.0 lb

## 2024-04-06 DIAGNOSIS — I10 Essential (primary) hypertension: Secondary | ICD-10-CM

## 2024-04-06 MED ORDER — HYDRALAZINE HCL 50 MG PO TABS: 75.0000 mg | ORAL_TABLET | Freq: Three times a day (TID) | ORAL | 2 refills | Status: DC

## 2024-04-06 NOTE — Patient Instructions (Signed)
       Medications changes include :   increase hydralazine  to 75 mg three times a day

## 2024-04-06 NOTE — Assessment & Plan Note (Signed)
 Chronic Not ideally controlled Her blood pressure has not been ideally controlled for a while, but higher recently at home  Not having any chest pain, palpitations, headaches or dizziness/lightheadedness Continue amlodipine  2.5 mg daily Increase hydralazine  to 75 mg 3 times daily Monitor BP at home Discussed that we can increase the hydralazine  further if needed

## 2024-04-06 NOTE — Telephone Encounter (Signed)
 Copied from CRM [id number]. Topic: Clinical - Medical Advice >> Apr 06, 2024 10:42 AM Keitha Pata L wrote: Reason for [id number] DAUGHTER SAID HER MOTHERS BLOOD PRESSURE HAS BEEN HIGH AT ABOUT 170 AND WANTED TO KNOW IF YOU CAN GIVE HER CALL [phone]  Chief Complaint: elevated bp, headache Symptoms: see above Frequency: started today Pertinent Negatives: Patient denies numbness, tingling, weakness Disposition: [] ED /[] Urgent Care (no appt availability in office) / [x] Appointment(In office/virtual)/ []  [place] [facility]/ [] Home Care/ [] Refused Recommended Disposition /[]  Mobile Bus/ []  Follow-up with PCP Additional Notes: apt made per protocol; care advice given, denies questions; instructed to go to ER if becomes worse.   Reason for Disposition  Systolic BP  >= 180 OR Diastolic >= 110  Answer Assessment - Initial Assessment Questions 1. BLOOD PRESSURE: "What is the blood pressure?" "Did you take at least two measurements 5 minutes apart?"     170/"can't remember bottom number" 2. ONSET: "When did you take your blood pressure?"     States decided to take bp today 3. HOW: "How did you take your blood pressure?" (e.g., automatic home BP monitor, visiting nurse)     automatic 4. HISTORY: "Do you have a history of high blood pressure?"     yes 5. MEDICINES: "Are you taking any medicines for blood pressure?" "Have you missed any doses recently?"     She missed dose yesterday 6. OTHER SYMPTOMS: "Do you have any symptoms?" (e.g., blurred vision, chest pain, difficulty breathing, headache, weakness)     Headache states dull and dizziness 7. PREGNANCY: "Is there any chance you are pregnant?" "When was your last menstrual period?"     na  Protocols used: Blood Pressure - High-A-AH

## 2024-04-06 NOTE — Progress Notes (Signed)
 Subjective:    Patient ID: Shirley Wells, female    DOB: [birth year], 83 y.o.   MRN: [id number]      HPI Shirley Wells is here for  Chief Complaint  Patient presents with   Hypertension     BP elelvated. BP last night was 180's.  This morning it was 176.  Mostly 150's at home she thinks.  She did consume a lot of salt over the weekend and wonders if that has influenced the elevated blood pressure.  She is taking her medication as prescribed.  She denies any headaches, lightheadedness, dizziness, chest pain or palpitations.    Medications and allergies reviewed with patient and updated if appropriate.  Current Outpatient Medications on File Prior to Visit  Medication Sig Dispense Refill   acetaminophen  (TYLENOL ) 650 MG CR tablet Take 1,300 mg by mouth every 8 (eight) hours as needed for pain.     amLODipine  (NORVASC ) 2.5 MG tablet Take 1 tablet (2.5 mg total) by mouth daily. 90 tablet 3   aspirin  81 MG tablet Take 81 mg by mouth 2 (two) times a week.     clopidogrel  (PLAVIX ) 75 MG tablet TAKE 1 TABLET(75 MG) BY MOUTH DAILY 90 tablet 1   cyclopentolate (CYCLODRYL,CYCLOGYL) 1 % ophthalmic solution Place 1 drop into the left eye once.     elacestrant  hydrochloride (ORSERDU ) 345 MG tablet Take 1 tablet (345 mg total) by mouth daily. Take with food. 30 tablet 1   erythromycin ophthalmic ointment      hydrALAZINE  (APRESOLINE ) 50 MG tablet Take 1 tablet (50 mg total) by mouth 3 (three) times daily. 90 tablet 5   loratadine (CLARITIN) 10 MG tablet Take 10 mg by mouth daily as needed for allergies.     Menthol, Topical Analgesic, (BIOFREEZE EX) Apply 1 application topically daily as needed (muscle pain).     milk thistle 175 MG tablet Take 175 mg by mouth daily.     moxifloxacin (VIGAMOX) 0.5 % ophthalmic solution Place 1 drop into the left eye 3 (three) times daily.     Multiple Vitamin (MULTIVITAMIN) capsule Take 1 capsule by mouth daily.     prednisoLONE acetate (PRED FORTE) 1 % ophthalmic  suspension SHAKE LIQUID AND INSTILL 1 DROP IN LEFT EYE TWICE DAILY FOR 2 WEEKS THEN STOP     ZIRGAN 0.15 % GEL Place 1 drop into the left eye 5 (five) times daily.     No current facility-administered medications on file prior to visit.    Review of Systems  Cardiovascular:  Positive for leg swelling (mild - chronic). Negative for chest pain and palpitations.  Neurological:  Positive for dizziness (occ when she gets up). Negative for light-headedness and headaches.       Objective:   Vitals:   04/06/24 1501  BP: (!) 158/80  Pulse: (!) 59  Temp: 98.1 F (36.7 C)  SpO2: 99%   BP Readings from Last 3 Encounters:  04/06/24 (!) 158/80  03/23/24 (!) 142/76  03/02/24 (!) 151/60   Wt Readings from Last 3 Encounters:  04/06/24 137 lb (62.1 kg)  03/23/24 140 lb (63.5 kg)  03/02/24 140 lb 6.4 oz (63.7 kg)   Body mass index is 23.15 kg/m.    Physical Exam Constitutional:      General: She is not in acute distress.    Appearance: Normal appearance.  HENT:     Head: Normocephalic and atraumatic.  Eyes:     Conjunctiva/sclera: Conjunctivae normal.  Cardiovascular:  Rate and Rhythm: Normal rate and regular rhythm.     Heart sounds: Normal heart sounds.  Pulmonary:     Effort: Pulmonary effort is normal. No respiratory distress.     Breath sounds: Normal breath sounds. No wheezing.  Musculoskeletal:     Cervical back: Neck supple.     Right lower leg: No edema.     Left lower leg: No edema.  Lymphadenopathy:     Cervical: No cervical adenopathy.  Skin:    General: Skin is warm and dry.     Findings: No rash.  Neurological:     Mental Status: She is alert. Mental status is at baseline.  Psychiatric:        Mood and Affect: Mood normal.        Behavior: Behavior normal.            Assessment & Plan:    See Problem List for Assessment and Plan of chronic medical problems.

## 2024-04-24 ENCOUNTER — Inpatient Hospital Stay: Attending: Hematology

## 2024-04-24 ENCOUNTER — Ambulatory Visit (HOSPITAL_COMMUNITY)
Admission: RE | Admit: 2024-04-24 | Discharge: 2024-04-24 | Disposition: A | Discharge status: Home / Self Care | Source: Ambulatory Visit | Attending: Hematology | Admitting: Hematology

## 2024-04-24 DIAGNOSIS — Z170 Estrogen receptor positive status [ER+]: Secondary | ICD-10-CM | POA: Insufficient documentation

## 2024-04-24 DIAGNOSIS — I129 Hypertensive chronic kidney disease with stage 1 through stage 4 chronic kidney disease, or unspecified chronic kidney disease: Secondary | ICD-10-CM | POA: Insufficient documentation

## 2024-04-24 DIAGNOSIS — C50212 Malignant neoplasm of upper-inner quadrant of left female breast: Secondary | ICD-10-CM

## 2024-04-24 DIAGNOSIS — Z1732 Human epidermal growth factor receptor 2 negative status: Secondary | ICD-10-CM | POA: Insufficient documentation

## 2024-04-24 DIAGNOSIS — N184 Chronic kidney disease, stage 4 (severe): Secondary | ICD-10-CM | POA: Diagnosis not present

## 2024-04-24 DIAGNOSIS — Z1721 Progesterone receptor positive status: Secondary | ICD-10-CM | POA: Diagnosis not present

## 2024-04-24 DIAGNOSIS — C7951 Secondary malignant neoplasm of bone: Secondary | ICD-10-CM | POA: Diagnosis not present

## 2024-04-24 LAB — CBC WITH DIFFERENTIAL/PLATELET
Abs Immature Granulocytes: 0.03 10*3/uL (ref 0.00–0.07)
Basophils Absolute: 0.1 10*3/uL (ref 0.0–0.1)
Basophils Relative: 1 %
Eosinophils Absolute: 0.2 10*3/uL (ref 0.0–0.5)
Eosinophils Relative: 3 %
HCT: 33.8 % — ABNORMAL LOW (ref 36.0–46.0)
Hemoglobin: 11.5 g/dL — ABNORMAL LOW (ref 12.0–15.0)
Immature Granulocytes: 1 %
Lymphocytes Relative: 23 %
Lymphs Abs: 1.4 10*3/uL (ref 0.7–4.0)
MCH: 35.9 pg — ABNORMAL HIGH (ref 26.0–34.0)
MCHC: 34 g/dL (ref 30.0–36.0)
MCV: 105.6 fL — ABNORMAL HIGH (ref 80.0–100.0)
Monocytes Absolute: 0.5 10*3/uL (ref 0.1–1.0)
Monocytes Relative: 8 %
Neutro Abs: 4 10*3/uL (ref 1.7–7.7)
Neutrophils Relative %: 64 %
Platelets: 219 10*3/uL (ref 150–400)
RBC: 3.2 MIL/uL — ABNORMAL LOW (ref 3.87–5.11)
RDW: 14.6 % (ref 11.5–15.5)
WBC: 6.1 10*3/uL (ref 4.0–10.5)
nRBC: 0 % (ref 0.0–0.2)

## 2024-04-24 LAB — COMPREHENSIVE METABOLIC PANEL WITH GFR
ALT: 56 U/L — ABNORMAL HIGH (ref 0–44)
AST: 57 U/L — ABNORMAL HIGH (ref 15–41)
Albumin: 3.8 g/dL (ref 3.5–5.0)
Alkaline Phosphatase: 215 U/L — ABNORMAL HIGH (ref 38–126)
Anion gap: 7 (ref 5–15)
BUN: 38 mg/dL — ABNORMAL HIGH (ref 8–23)
CO2: 27 mmol/L (ref 22–32)
Calcium: 10.2 mg/dL (ref 8.9–10.3)
Chloride: 108 mmol/L (ref 98–111)
Creatinine, Ser: 1.77 mg/dL — ABNORMAL HIGH (ref 0.44–1.00)
GFR, Estimated: 28 mL/min — ABNORMAL LOW (ref >60)
Glucose, Bld: 100 mg/dL — ABNORMAL HIGH (ref 70–99)
Potassium: 3.7 mmol/L (ref 3.5–5.1)
Sodium: 142 mmol/L (ref 135–145)
Total Bilirubin: 0.5 mg/dL (ref 0.0–1.2)
Total Protein: 7.3 g/dL (ref 6.5–8.1)

## 2024-04-24 LAB — LIPID PANEL
Cholesterol: 268 mg/dL — ABNORMAL HIGH (ref 0–200)
HDL: 90 mg/dL (ref >40)
LDL Cholesterol: 148 mg/dL — ABNORMAL HIGH (ref 0–99)
Total CHOL/HDL Ratio: 3 ratio
Triglycerides: 152 mg/dL — ABNORMAL HIGH (ref <150)
VLDL: 30 mg/dL (ref 0–40)

## 2024-04-24 LAB — GLUCOSE, CAPILLARY: Glucose-Capillary: 111 mg/dL — ABNORMAL HIGH (ref 70–99)

## 2024-04-24 MED ORDER — FLUDEOXYGLUCOSE F - 18 (FDG) INJECTION
6.8100 | Freq: Once | INTRAVENOUS | Status: AC | PRN
  Administered 2024-04-24: 6.81 via INTRAVENOUS

## 2024-04-25 LAB — CANCER ANTIGEN 27.29: CA 27.29: 294.7 U/mL — ABNORMAL HIGH (ref 0.0–38.6)

## 2024-04-30 ENCOUNTER — Inpatient Hospital Stay: Attending: Hematology | Admitting: Hematology

## 2024-04-30 ENCOUNTER — Telehealth: Payer: Self-pay | Admitting: Pharmacy Technician

## 2024-04-30 ENCOUNTER — Other Ambulatory Visit (HOSPITAL_COMMUNITY): Payer: Self-pay

## 2024-04-30 DIAGNOSIS — Z170 Estrogen receptor positive status [ER+]: Secondary | ICD-10-CM

## 2024-04-30 DIAGNOSIS — Z79811 Long term (current) use of aromatase inhibitors: Secondary | ICD-10-CM | POA: Insufficient documentation

## 2024-04-30 DIAGNOSIS — Z9012 Acquired absence of left breast and nipple: Secondary | ICD-10-CM | POA: Insufficient documentation

## 2024-04-30 DIAGNOSIS — N189 Chronic kidney disease, unspecified: Secondary | ICD-10-CM | POA: Insufficient documentation

## 2024-04-30 DIAGNOSIS — C50212 Malignant neoplasm of upper-inner quadrant of left female breast: Secondary | ICD-10-CM | POA: Diagnosis not present

## 2024-04-30 DIAGNOSIS — C7951 Secondary malignant neoplasm of bone: Secondary | ICD-10-CM | POA: Insufficient documentation

## 2024-04-30 DIAGNOSIS — Z1721 Progesterone receptor positive status: Secondary | ICD-10-CM | POA: Insufficient documentation

## 2024-04-30 DIAGNOSIS — Z1732 Human epidermal growth factor receptor 2 negative status: Secondary | ICD-10-CM | POA: Insufficient documentation

## 2024-04-30 MED ORDER — PALBOCICLIB 75 MG PO TABS: 75.0000 mg | ORAL_TABLET | Freq: Every day | ORAL | 2 refills | Status: DC

## 2024-04-30 NOTE — Telephone Encounter (Signed)
 Oral Oncology Patient Advocate Encounter  After completing a benefits investigation, prior authorization for Ibrance  is not required at this time through [employer].  Patient's copay is $0.    Roda Cirri, CPhT Specialty Pharmacy Patient Advocate Phone: [phone] Fax: [phone]

## 2024-04-30 NOTE — Progress Notes (Signed)
 [facility]   Telephone:([phone] [phone]   Clinic Follow up Note   Patient Care Team: Colene Dauphin, MD as PCP - General (Internal Medicine) Lisabeth Rider, MD as Consulting Physician (Neurology) Enid Harry, MD as Consulting Physician (General Surgery) Sonja Toronto, MD as Consulting Physician (Hematology) Johna Myers, MD as Consulting Physician (Radiation Oncology) 04/30/2024  I connected with Shirley Wells on 04/30/24 at  1:00 PM EDT by telephone and verified that I am speaking with the correct person using two identifiers.   I discussed the limitations, risks, security and privacy concerns of performing an evaluation and management service by telephone and the availability of in person appointments. I also discussed with the patient that there may be a patient responsible charge related to this service. The patient expressed understanding and agreed to proceed.   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 S/p left total mastectomy. She declined antiestrogen therapy due to concerns of side effects. She lost follow up after 07/2017.   -After 6 months of left hip and ribcage pain she was found to have diffuse bone metastasis on 11/28/20 PET. This was confirmed on 12/09/20 bone biopsy --she started first-line Anastrozole  in 11/2020 and Ibrance  on 01/09/21, not very compliant with Ibrance   -progressed on PET 10/03/2022 -changed her tx to fulvestrant  injection and Verzenio . Due to GI side effect, I changed her Verzenio  to Ibrance  -Restaging PET scan from January 28, 2023 showed overall stable disease, with some  lesion mildly improved -restaging PET on 11/18 showed worsening bone metastasis  --Guardant 360 revealed PIK3CA and ESR1 mutations which are targetable  -treatment changed to elacestrant  345 mg daily in early Jan 2025 - PET scan Apr 24, 2024 showed disease progression in bone. - I recommend PIK3CA inhibitor, she declined due to the potential side effects. -Will add Ibrance  to elacestrant   Assessment & Plan Metastatic breast cancer with bone metastases Progression of metastatic breast cancer in the bones as indicated by recent PET scan, showing worsening in some areas and new lesions since November 2024. Current treatment with Elacestrant  is yielding diminishing benefits. Consideration of treatment change due to progression. Discussed options include PIK3CA targeted therapy with oral medication and injection, potentially combined with Ibrance , or oral chemotherapy with Xeloda. Targeted therapy risks include hyperglycemia, potentially requiring diabetic medication, complicated by chronic kidney disease. She prefers to avoid diabetic medication.  -Decision made to add Ibrance  to current Elacestrant  regimen as a less invasive option. Ibrance  may cause fatigue and diarrhea, necessitating hydration and monitoring. - Continue Elacestrant . - Add Ibrance  75 mg, 14 days on, 1 week off. - Order Ibrance  through specialty pharmacy. - Monitor blood counts while on Ibrance . - Advise hydration to prevent dehydration due to potential diarrhea. - Educate on potential fatigue and diarrhea as side effects of Ibrance .  Chronic kidney disease, unspecified Chronic kidney disease complicates potential treatment options for metastatic breast cancer, particularly the use of metformin for hyperglycemia management, which is contraindicated due to impaired kidney function.  Plan -PET scan reviewed, she has disease progression in bones - I recommended changing treatment to PIK3CA inhibitors, she declined - Will continue  elacestrant , and add low-dose  Ibrance  75 mg daily for 2 weeks on and 1 week off -f/u in a month    SUMMARY OF ONCOLOGIC HISTORY: Oncology History Overview Note   Cancer Staging  Malignant neoplasm of upper-inner quadrant of left breast in female, estrogen receptor positive (HCC) Staging form: Breast, AJCC 8th Edition - Clinical stage from 06/18/2017: Stage IB (cT2, cN0, cM0, G2, ER+, PR+, HER2-) - Signed by Sonja Rockland, MD on 06/24/2017 Histologic grading system: 3 grade system - Pathologic stage from 07/30/2017: Stage IA (pT2, pN0, cM0, G2, ER+, PR+, HER2-, Oncotype DX score: 23) - Signed by Sonja Mora, MD on 08/23/2017 Neoadjuvant therapy: No Nuclear grade: G2 Multigene prognostic tests performed: Oncotype DX Recurrence score range: Greater than or equal to 11 Histologic grading system: 3 grade system Residual tumor (R): R0 - None Laterality: Left     Malignant neoplasm of upper-inner quadrant of left breast in female, estrogen receptor positive (HCC)  06/17/2017 Mammogram   US  and MM diagnostic Breast Tomo Bilateral  IMPRESSION: 1. 3.4 palpable mass in the 10 o'clock position of the left breast is highly suspicious for malignancy.  2. No evidence of malignancy in the right breast.  3. Ultrasound of the left axilla shows normal sized axillary lymph nodes.   06/18/2017 Initial Biopsy   Diagnosis 06/18/17 Breast, left, needle core biopsy, 10:00 o'clock - INVASIVE MAMMARY CARCINOMA. At least G2  The malignant cells are negative for E-Cadherin, supporting a lobular phenotype.   06/18/2017 Initial Diagnosis   Malignant neoplasm of upper-inner quadrant of left breast in female, estrogen receptor positive (HCC)   06/18/2017 Receptors her2   ER 95%+, PR 25%+, both strong staining  HER2- Ki67 30%    07/30/2017 Surgery   LEFT TOTAL MASTECTOMY WITH AXILLARY SENTINEL LYMPH NODE BIOPSY by Dr. Delane Fear on 07/30/17   07/30/2017 Pathology Results   Diagnosis 07/30/17 1. Breast, simple mastectomy,  Left Total - INVASIVE LOBULAR CARCINOMA, GRADE 2, SPANNING 3.5 CM. - LOBULAR CARCINOMA IN SITU. - INVASIVE CARCINOMA IS BROADLY 0.1 CM FROM POSTERIOR MARGIN. - PERINEURAL INVASION PRESENT. - SEE ONCOLOGY TABLE. 2. Lymph node, sentinel, biopsy, Left Axillary - ONE OF ONE LYMPH NODES NEGATIVE FOR CARCINOMA (0/1). 3. Lymph node, sentinel, biopsy, Left - ONE OF ONE LYMPH NODES NEGATIVE FOR CARCINOMA (0/1).   07/30/2017 Oncotype testing   Oncotype 07/30/17 Recurrence score is 23, an intermediate risk With a 10-year risk of recurrence at 15% with tamoxifen  alone.     11/11/2020 Imaging   MRI Lumbar Spine  IMPRESSION: Diffuse osseous metastases. Chronic inferior L2 endplate deformity with mild height loss.   Likely late subacute pathologic fracture deformities involving the superior L1 endplate and right L3 lamina.   Multilevel spondylosis. Moderate to severe spinal canal and neural foraminal narrowing at the L3-4 and L4-5 levels.   Mild to moderate spinal canal and neural foraminal narrowing at the L5-S1 level.   11/28/2020 PET scan   IMPRESSION: 1. Widespread and diffuse hypermetabolic bony metastases. 2. No evidence for soft tissue metastases in the neck, chest, abdomen, or pelvis. 3.  Aortic Atherosclerois (ICD10-170.0)     11/29/2020 -  Chemotherapy   Zometa  q57months starting 11/29/20. Held after first dose. Given her CKD will only give when she has hypercalcemia and may switch to Prolia.     12/04/2020 Imaging   MRI Brain  IMPRESSION: No evidence of intracranial metastatic disease.   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   Mild chronic microvascular ischemic changes. Small chronic right cerebellar infarct.  12/07/2020 - 12/21/2020 Radiation Therapy   palliative RT to left hip with Dr Jeryl Moris on 12/07/20-12/21/20   12/09/2020 Pathology Results   FINAL MICROSCOPIC DIAGNOSIS:   A. BONE, BIOPSY:  - Metastatic carcinoma.  - See comment.   COMMENT:  The  morphology is compatible with metastatic lobular breast carcinoma.  Estrogen receptor, progesterone receptor and HER2/neu will be performed.   ADDENDUM:   PROGNOSTIC INDICATOR RESULTS:   Immunohistochemical and morphometric analysis performed manually   The tumor cells are EQUIVOCAL for Her2 (2+).  Her2 by FISH will be performed and the results will be reported  separately.   Estrogen Receptor:       POSITIVE, 50%, MODERATE-STRONG STAINING  Progesterone Receptor:   POSITIVE, 15%, MODERATE-STRONG STAINING    11/2020 -  Anti-estrogen oral therapy   First-line Anti-estrogen therapy Anastrozole  1mg  daily starting in 11/2020 and Ibrance  starting in 12/2020    01/09/2021 -  Chemotherapy   Ibrance  3 weeks on/1 weeks off starting beginning of 01/09/21. Reduced to 75mg  starting with C2 (02/13/21) due to cytopenia and fatigue. Reduced to 75mg  2 weeks on/1 week off starting C3 on 03/13/21)    03/31/2021 PET scan   IMPRESSION: 1. Dramatic reduction in metabolic activity throughout the skeleton. Metabolic activity of diffuse sclerotic skeletal lesions is now essentially at background blood pool level. No focal metabolic activity. 2. Persistent diffuse confluent sclerotic skeletal metastasis unchanged. 3. No evidence of soft tissue breast cancer metastasis.   10/13/2021 PET scan   IMPRESSION: 1. Widespread osseous metastatic disease again noted with areas of minimally increased metabolic activity within the sternal manubrium and L2 vertebral body. 2. No evidence of extra osseous metastatic disease.     Discussed the use of AI scribe software for clinical note transcription with the patient, who gave verbal consent to proceed.  History of Present Illness Shirley Wells is an 83 year old female with metastatic breast cancer who presents for review of her recent PET scan results.  She is currently on Elacestrant , started in January 2025, with recent missed doses but otherwise compliant. The PET  scan shows new bone lesions without lymph node, liver, or other organ involvement. Previously, she was on Ibrance  with a cycle of two weeks on and one week off at 75 mg, and she has some remaining at home. No side effects from Elacestrant  or significant issues with her current treatment are reported. Her kidney problems preclude metformin use, but she does not have diabetes. She plans to go on vacation and will return in two weeks. No other symptoms or concerns are present.     REVIEW OF SYSTEMS:   Constitutional: Denies fevers, chills or abnormal weight loss Eyes: Denies blurriness of vision Ears, nose, mouth, throat, and face: Denies mucositis or sore throat Respiratory: Denies cough, dyspnea or wheezes Cardiovascular: Denies palpitation, chest discomfort or lower extremity swelling Gastrointestinal:  Denies nausea, heartburn or change in bowel habits Skin: Denies abnormal skin rashes Lymphatics: Denies new lymphadenopathy or easy bruising Neurological:Denies numbness, tingling or new weaknesses Behavioral/Psych: Mood is stable, no new changes  All other systems were reviewed with the patient and are negative.  MEDICAL HISTORY:  Past Medical History:  Diagnosis Date   CHICKENPOX, HX OF 03/21/2010   Qualifier: Diagnosis of  By: Georganne Kind RMA, Lucy     CVA (cerebral infarction) 02/2010   no residual deficits, a/w mid basal art stenosis, declined IC stent as rec by IR/neuro   Dyslipidemia    intol of statin due  to >3x increase LFTs   GERD (gastroesophageal reflux disease)    hx   Hypertension, essential    Malignant neoplasm of upper-inner quadrant of left breast in female, estrogen receptor positive (HCC) 06/20/2017   Osteoarthritis    Stroke [facility])     SURGICAL HISTORY: Past Surgical History:  Procedure Laterality Date   CHOLECYSTECTOMY  1981   MASTECTOMY W/ SENTINEL NODE BIOPSY Left 07/30/2017   Procedure: LEFT TOTAL MASTECTOMY WITH AXILLARY SENTINEL LYMPH NODE BIOPSY;  Surgeon:  Enid Harry, MD;  Location: [facility] OR;  Service: General;  Laterality: Left;    I have reviewed the social history and family history with the patient and they are unchanged from previous note.  ALLERGIES:  is allergic to influenza vaccines, pneumococcal vaccines, simvastatin, tetanus toxoids, and zoster vaccine live.  MEDICATIONS:  Current Outpatient Medications  Medication Sig Dispense Refill   acetaminophen  (TYLENOL ) 650 MG CR tablet Take 1,300 mg by mouth every 8 (eight) hours as needed for pain.     amLODipine  (NORVASC ) 2.5 MG tablet Take 1 tablet (2.5 mg total) by mouth daily. 90 tablet 3   aspirin  81 MG tablet Take 81 mg by mouth 2 (two) times a week.     clopidogrel  (PLAVIX ) 75 MG tablet TAKE 1 TABLET(75 MG) BY MOUTH DAILY 90 tablet 1   cyclopentolate (CYCLODRYL,CYCLOGYL) 1 % ophthalmic solution Place 1 drop into the left eye once.     elacestrant  hydrochloride (ORSERDU ) 345 MG tablet Take 1 tablet (345 mg total) by mouth daily. Take with food. 30 tablet 1   erythromycin ophthalmic ointment      hydrALAZINE  (APRESOLINE ) 50 MG tablet Take 1.5 tablets (75 mg total) by mouth 3 (three) times daily. 405 tablet 2   loratadine (CLARITIN) 10 MG tablet Take 10 mg by mouth daily as needed for allergies.     Menthol, Topical Analgesic, (BIOFREEZE EX) Apply 1 application topically daily as needed (muscle pain).     milk thistle 175 MG tablet Take 175 mg by mouth daily.     moxifloxacin (VIGAMOX) 0.5 % ophthalmic solution Place 1 drop into the left eye 3 (three) times daily.     Multiple Vitamin (MULTIVITAMIN) capsule Take 1 capsule by mouth daily.     palbociclib  (IBRANCE ) 75 MG tablet Take 1 tablet (75 mg total) by mouth daily. Take for 14 days on, 7 days off, repeat every 21 days. 14 tablet 2   prednisoLONE acetate (PRED FORTE) 1 % ophthalmic suspension SHAKE LIQUID AND INSTILL 1 DROP IN LEFT EYE TWICE DAILY FOR 2 WEEKS THEN STOP     ZIRGAN 0.15 % GEL Place 1 drop into the left eye 5 (five)  times daily.     No current facility-administered medications for this visit.    PHYSICAL EXAMINATION: Not performed   LABORATORY DATA:  I have reviewed the data as listed    Latest Ref Rng & Units 04/24/2024   10:04 AM 03/02/2024   10:16 AM 12/30/2023    9:06 AM  CBC  WBC 4.0 - 10.5 K/uL 6.1  6.6  4.9   Hemoglobin 12.0 - 15.0 g/dL 02.7  25.3  66.4   Hematocrit 36.0 - 46.0 % 33.8  36.0  33.0   Platelets 150 - 400 K/uL 219  204  195         Latest Ref Rng & Units 04/24/2024   10:04 AM 03/02/2024   10:16 AM 12/30/2023    9:06 AM  CMP  Glucose 70 - 99  mg/dL 098  119  147   BUN 8 - 23 mg/dL 38  43  39   Creatinine 0.44 - 1.00 mg/dL 8.29  5.62  1.30   Sodium 135 - 145 mmol/L 142  143  140   Potassium 3.5 - 5.1 mmol/L 3.7  4.8  4.0   Chloride 98 - 111 mmol/L 108  107  109   CO2 22 - 32 mmol/L 27  27  21    Calcium 8.9 - 10.3 mg/dL 86.5  9.9  78.4   Total Protein 6.5 - 8.1 g/dL 7.3  7.4  7.2   Total Bilirubin 0.0 - 1.2 mg/dL 0.5  0.4  0.4   Alkaline Phos 38 - 126 U/L 215  225  172   AST 15 - 41 U/L 57  32  28   ALT 0 - 44 U/L 56  22  21       RADIOGRAPHIC STUDIES: I have personally reviewed the radiological images as listed and agreed with the findings in the report. No results found.     I discussed the assessment and treatment plan with the patient. The patient was provided an opportunity to ask questions and all were answered. The patient agreed with the plan and demonstrated an understanding of the instructions.   The patient was advised to call back or seek an in-person evaluation if the symptoms worsen or if the condition fails to improve as anticipated.  I provided 25 minutes of face-to-face video visit time during this encounter, including review of chart and various tests results, discussions about plan of care and coordination of care plan.    Sonja Beckley, MD 04/30/24

## 2024-04-30 NOTE — Assessment & Plan Note (Addendum)
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 S/p left total mastectomy. She declined antiestrogen therapy due to concerns of side effects. She lost follow up after 07/2017.   -After 6 months of left hip and ribcage pain she was found to have diffuse bone metastasis on 11/28/20 PET. This was confirmed on 12/09/20 bone biopsy --she started first-line Anastrozole  in 11/2020 and Ibrance  on 01/09/21, not very compliant with Ibrance   -progressed on PET 10/03/2022 -changed her tx to fulvestrant  injection and Verzenio . 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 - I recommend PIK3CA inhibitor, she declined due to the potential side effects. -Will add Ibrance  to elacestrant

## 2024-05-01 ENCOUNTER — Telehealth: Payer: Self-pay

## 2024-05-01 DIAGNOSIS — C50212 Malignant neoplasm of upper-inner quadrant of left female breast: Secondary | ICD-10-CM

## 2024-05-01 NOTE — Telephone Encounter (Addendum)
 Addendum:  Patient states that she now is unsure when she will actually have the cataract surgery as it has been difficult to get the appointments. Patient states she is unsure the exact date she will have surgery now. We will proceed with scheduling the Ibrance  to her so she can start and she will call the office with exact date of surgery to coordinate the Ibrance  appropriately.   Patient has only been off therapy for a couple weeks and does not have any questions about the medication. Patient did not have any side effects from the 75mg  dose and will restart on 05/06/24.   Patient will have medication delivered to her on 05/05/24.   Oral Oncology Pharmacist Encounter  Received new prescription for Ibrance  (palbociclib ) for the treatment of metastatic ER positive, HER2 negative breast cancer in conjunction with Orserdu , planned duration until disease progression or unacceptable toxicity. Patient denied transition to PIK3CA inhibitor and would like to go back on the Ibrance . Patient had recent PET scan showing progression after being on therapy with Orserdu  alone. See MD note for discussion with patient about medications. Patient still has some Ibrance  at home per MD note although after patient got home she only had the 100mg  tablets not the 75mg  tablets. Discussed patients case with Lacie Burton, NP as Dr. Maryalice Smaller is out of town and due to patient having cataract surgery pre-op on 05/10/24 patient will start the Ibrance  for one week on, one week off until surgery is completed and then patient will start the 2 weeks on, 1 week off.   Labs from 04/24/24 assessed, no interventions needed. Prescription dose and frequency assessed for appropriateness.    Current medication list in Epic reviewed, no significant/ relevant DDIs with Ibrance  identified.   Evaluated chart and no patient barriers to medication adherence noted.   Patient agreement for treatment documented in MD note on 04/30/2024. Prescription has been  e-scribed to the [facility] for benefits analysis and approval. Oral Oncology Clinic will continue to follow for insurance authorization, copayment issues, initial counseling and start date.  Jayci Ellefson, PharmD Hematology/Oncology Clinical Pharmacist [facility] Oral Chemotherapy Navigation Clinic [phone] 05/01/2024 11:37 AM

## 2024-05-04 ENCOUNTER — Other Ambulatory Visit: Payer: Self-pay

## 2024-05-04 ENCOUNTER — Other Ambulatory Visit (HOSPITAL_COMMUNITY): Payer: Self-pay

## 2024-05-04 MED ORDER — PALBOCICLIB 75 MG PO TABS
75.0000 mg | ORAL_TABLET | Freq: Every day | ORAL | 2 refills | Status: DC
  Filled 2024-05-04: qty 14, 21d supply, fill #0
  Filled 2024-05-21: qty 14, 21d supply, fill #1
  Filled 2024-06-12: qty 14, 21d supply, fill #2

## 2024-05-04 NOTE — Progress Notes (Signed)
 Specialty Pharmacy Initial Fill Coordination Note  Shirley Wells is a 83 y.o. female contacted today regarding initial fill of specialty medication(s) Palbociclib  (IBRANCE )  Patient requested Delivery   Delivery date: 05/05/24  Verified address: [address] [place], [place] [address]  Medication will be filled on 05/04/24.   Patient is aware of $0.00 copayment.    Hansel Ley, CPhT Pharmacy Technician Coordinator [facility] [phone] (Ph) 05/04/2024 9:42 AM

## 2024-05-04 NOTE — Progress Notes (Signed)
 Received order after cut off time for [employer]. Med will be mailed 05/05/24. Printed [employer] label and inform [employer] via teams message.

## 2024-05-04 NOTE — Telephone Encounter (Addendum)
 Patient successfully OnBoarded and drug education scheduled to be provided by pharmacist in clinic today, 05/04/24, when patient picks up samples. Medication scheduled to be shipped on Monday, 05/04/24, for delivery on Tuesday, 05/05/24, from [facility] to patient's address. Patient also knows to call me at [phone] with any questions or concerns regarding receiving medication or if there is any unexpected change in co-pay.    Hansel Ley, CPhT Pharmacy Technician Coordinator [facility] [phone] (Ph) 05/04/2024 9:44 AM

## 2024-05-04 NOTE — Progress Notes (Signed)
 Specialty Pharmacy Initiation Note   Shirley Wells is a 82 y.o. female who will be followed by the specialty pharmacy service for RxSp Oncology    Review of administration, indication, effectiveness, safety, potential side effects, storage/disposable, and missed dose instructions occurred today for patient's specialty medication(s) Palbociclib  (IBRANCE )     Patient/Caregiver did not have any additional questions or concerns.   Patient's therapy is appropriate to: Initiate    Goals Addressed             This Visit's Progress    Maintain optimal adherence to therapy       Patient is initiating therapy. Patient will maintain adherence        Patient is restarting therapy and will take in addition to Orserdu  per Dr. Maryalice Smaller.   Garyn Waguespack M Larnce Schnackenberg Specialty Pharmacist

## 2024-05-20 ENCOUNTER — Encounter (INDEPENDENT_AMBULATORY_CARE_PROVIDER_SITE_OTHER): Payer: Self-pay

## 2024-05-21 ENCOUNTER — Other Ambulatory Visit: Payer: Self-pay

## 2024-05-21 ENCOUNTER — Other Ambulatory Visit (HOSPITAL_COMMUNITY): Payer: Self-pay

## 2024-05-21 NOTE — Progress Notes (Signed)
 Specialty Pharmacy Refill Coordination Note  Shirley Wells is a 83 y.o. female contacted today regarding refills of specialty medication(s) Palbociclib  (IBRANCE )   Patient requested (Patient-Rptd) Delivery   Delivery date: 05/22/24   Verified address[address]   Medication will be filled on 06.26.25.

## 2024-05-21 NOTE — Progress Notes (Signed)
 Specialty Pharmacy Ongoing Clinical Assessment Note  Shirley Wells is a 83 y.o. female who is being followed by the specialty pharmacy service for RxSp Oncology   Patient's specialty medication(s) reviewed today: Palbociclib  (IBRANCE )   Missed doses in the last 4 weeks: 0   Patient/Caregiver did not have any additional questions or concerns.   Therapeutic benefit summary: Patient is achieving benefit   Adverse events/side effects summary: Experienced adverse events/side effects (fatigue, tolerable at this time)   Patient's therapy is appropriate to: Continue    Goals Addressed             This Visit's Progress    Maintain optimal adherence to therapy   On track    Patient is initiating therapy. Patient will maintain adherence      Slow Disease Progression       Patient is initiating therapy. Patient will maintain adherence         Follow up: 3 months  Silvano LOISE Dolly Specialty Pharmacist

## 2024-05-28 ENCOUNTER — Other Ambulatory Visit: Payer: Self-pay

## 2024-06-12 ENCOUNTER — Other Ambulatory Visit: Payer: Self-pay

## 2024-06-12 NOTE — Progress Notes (Signed)
 Specialty Pharmacy Refill Coordination Note  Shirley Wells is a 83 y.o. female contacted today regarding refills of specialty medication(s) Palbociclib  (IBRANCE )   Patient requested Delivery   Delivery date: 06/16/24   Verified address: [address]   Medication will be filled on 06/15/24.

## 2024-06-15 ENCOUNTER — Other Ambulatory Visit: Payer: Self-pay

## 2024-06-18 ENCOUNTER — Other Ambulatory Visit: Payer: Self-pay

## 2024-06-18 MED ORDER — ELACESTRANT HYDROCHLORIDE 345 MG PO TABS: 345.0000 mg | ORAL_TABLET | Freq: Every day | ORAL | 1 refills | Status: DC

## 2024-06-29 ENCOUNTER — Other Ambulatory Visit: Payer: Self-pay

## 2024-06-29 ENCOUNTER — Other Ambulatory Visit: Payer: Self-pay | Admitting: Hematology

## 2024-06-29 ENCOUNTER — Other Ambulatory Visit (HOSPITAL_COMMUNITY): Payer: Self-pay

## 2024-06-29 DIAGNOSIS — Z170 Estrogen receptor positive status [ER+]: Secondary | ICD-10-CM

## 2024-06-29 MED ORDER — PALBOCICLIB 75 MG PO TABS
75.0000 mg | ORAL_TABLET | Freq: Every day | ORAL | 2 refills | Status: DC
  Filled 2024-06-29: qty 14, 14d supply, fill #0
  Filled 2024-07-01: qty 14, 21d supply, fill #0
  Filled 2024-07-29: qty 14, 21d supply, fill #1

## 2024-07-01 ENCOUNTER — Other Ambulatory Visit: Payer: Self-pay

## 2024-07-01 ENCOUNTER — Encounter (HOSPITAL_COMMUNITY)
Admission: RE | Admit: 2024-07-01 | Discharge: 2024-07-01 | Disposition: A | Discharge status: Home / Self Care | Source: Ambulatory Visit | Attending: Ophthalmology | Admitting: Ophthalmology

## 2024-07-01 ENCOUNTER — Encounter (HOSPITAL_COMMUNITY): Payer: Self-pay
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
## 2024-07-01 NOTE — Progress Notes (Signed)
 Specialty Pharmacy Refill Coordination Note  Shirley Wells is a 83 y.o. female contacted today regarding refills of specialty medication(s) Palbociclib  (IBRANCE )   Patient requested Delivery   Delivery date: 07/10/24   [address] address: [address]   Medication will be filled on 07/09/24.

## 2024-07-07 ENCOUNTER — Telehealth: Payer: Self-pay

## 2024-07-07 NOTE — Telephone Encounter (Signed)
 Pt's daughter LVM stating that the pt needs a f/u appt with Dr. Lanny.  Pt's daughter stated that the pt is taking Orserdu  and has been having side effect from the medication.  Pt's daughter stated that the pt has n/v, diarrhea, and fatigue.  Returned pt's daughter's called to f/u on the pt's symptoms.  Spoke with pt but daughter was also present at time of call with pt.  Pt denied diarrhea and stated she only had 1 episode as a result of taking a stool softener and stated she had 1 to 2 episodes of n/v r/t taking another medication that upset the pt's stomach.  Pt denied fatigue but stated she feels weak.  Pt denied every symptom the pt's daughter reported and both pt and daughter were arguing about the pt not reporting her symptoms.  Pt stated she's having eye surgery on Friday 07/10/2024 and does not want to do anything that's going to keep her from getting her surgery.  Stated that the pt based on the last office note was to return to clinic in 1 month for labs and OV.  Pt agreed to have a f/u appt with Dr. Lanny w/lab this week.  Appt scheduled.

## 2024-07-08 ENCOUNTER — Encounter (HOSPITAL_COMMUNITY)
Admission: RE | Admit: 2024-07-08 | Discharge: 2024-07-08 | Disposition: A | Discharge status: Home / Self Care | Source: Ambulatory Visit | Attending: Ophthalmology | Admitting: Ophthalmology
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
## 2024-07-09 ENCOUNTER — Encounter: Payer: Self-pay | Admitting: Hematology

## 2024-07-09 ENCOUNTER — Inpatient Hospital Stay

## 2024-07-09 ENCOUNTER — Inpatient Hospital Stay: Attending: Hematology | Admitting: Hematology

## 2024-07-09 VITALS — BP 138/60 | HR 58 | Temp 98.4°F | Resp 17 | Wt 137.5 lb

## 2024-07-09 DIAGNOSIS — D6481 Anemia due to antineoplastic chemotherapy: Secondary | ICD-10-CM | POA: Diagnosis not present

## 2024-07-09 DIAGNOSIS — R197 Diarrhea, unspecified: Secondary | ICD-10-CM | POA: Diagnosis not present

## 2024-07-09 DIAGNOSIS — C50212 Malignant neoplasm of upper-inner quadrant of left female breast: Secondary | ICD-10-CM

## 2024-07-09 DIAGNOSIS — Z79899 Other long term (current) drug therapy: Secondary | ICD-10-CM | POA: Insufficient documentation

## 2024-07-09 DIAGNOSIS — Z9012 Acquired absence of left breast and nipple: Secondary | ICD-10-CM | POA: Diagnosis not present

## 2024-07-09 DIAGNOSIS — C7951 Secondary malignant neoplasm of bone: Secondary | ICD-10-CM | POA: Insufficient documentation

## 2024-07-09 DIAGNOSIS — R112 Nausea with vomiting, unspecified: Secondary | ICD-10-CM | POA: Diagnosis not present

## 2024-07-09 DIAGNOSIS — Z923 Personal history of irradiation: Secondary | ICD-10-CM | POA: Insufficient documentation

## 2024-07-09 DIAGNOSIS — Z1721 Progesterone receptor positive status: Secondary | ICD-10-CM | POA: Insufficient documentation

## 2024-07-09 DIAGNOSIS — Z170 Estrogen receptor positive status [ER+]: Secondary | ICD-10-CM | POA: Diagnosis not present

## 2024-07-09 DIAGNOSIS — Z9221 Personal history of antineoplastic chemotherapy: Secondary | ICD-10-CM | POA: Insufficient documentation

## 2024-07-09 DIAGNOSIS — Z1732 Human epidermal growth factor receptor 2 negative status: Secondary | ICD-10-CM | POA: Insufficient documentation

## 2024-07-09 DIAGNOSIS — G893 Neoplasm related pain (acute) (chronic): Secondary | ICD-10-CM | POA: Insufficient documentation

## 2024-07-09 LAB — CBC WITH DIFFERENTIAL/PLATELET
Abs Immature Granulocytes: 0.01 K/uL (ref 0.00–0.07)
Basophils Absolute: 0.1 K/uL (ref 0.0–0.1)
Basophils Relative: 2 %
Eosinophils Absolute: 0.1 K/uL (ref 0.0–0.5)
Eosinophils Relative: 3 %
HCT: 26.5 % — ABNORMAL LOW (ref 36.0–46.0)
Hemoglobin: 8.9 g/dL — ABNORMAL LOW (ref 12.0–15.0)
Immature Granulocytes: 0 %
Lymphocytes Relative: 36 %
Lymphs Abs: 1 K/uL (ref 0.7–4.0)
MCH: 38.7 pg — ABNORMAL HIGH (ref 26.0–34.0)
MCHC: 33.6 g/dL (ref 30.0–36.0)
MCV: 115.2 fL — ABNORMAL HIGH (ref 80.0–100.0)
Monocytes Absolute: 0.2 K/uL (ref 0.1–1.0)
Monocytes Relative: 7 %
Neutro Abs: 1.4 K/uL — ABNORMAL LOW (ref 1.7–7.7)
Neutrophils Relative %: 52 %
Platelets: 160 K/uL (ref 150–400)
RBC: 2.3 MIL/uL — ABNORMAL LOW (ref 3.87–5.11)
RDW: 17.7 % — ABNORMAL HIGH (ref 11.5–15.5)
WBC: 2.7 K/uL — ABNORMAL LOW (ref 4.0–10.5)
nRBC: 0 % (ref 0.0–0.2)

## 2024-07-09 LAB — COMPREHENSIVE METABOLIC PANEL WITH GFR
ALT: 15 U/L (ref 0–44)
AST: 33 U/L (ref 15–41)
Albumin: 3.6 g/dL (ref 3.5–5.0)
Alkaline Phosphatase: 190 U/L — ABNORMAL HIGH (ref 38–126)
Anion gap: 10 (ref 5–15)
BUN: 35 mg/dL — ABNORMAL HIGH (ref 8–23)
CO2: 27 mmol/L (ref 22–32)
Calcium: 9.8 mg/dL (ref 8.9–10.3)
Chloride: 105 mmol/L (ref 98–111)
Creatinine, Ser: 2.02 mg/dL — ABNORMAL HIGH (ref 0.44–1.00)
GFR, Estimated: 24 mL/min — ABNORMAL LOW (ref >60)
Glucose, Bld: 102 mg/dL — ABNORMAL HIGH (ref 70–99)
Potassium: 4.1 mmol/L (ref 3.5–5.1)
Sodium: 142 mmol/L (ref 135–145)
Total Bilirubin: 0.4 mg/dL (ref 0.0–1.2)
Total Protein: 6.5 g/dL (ref 6.5–8.1)

## 2024-07-09 LAB — LIPID PANEL
Cholesterol: 241 mg/dL — ABNORMAL HIGH (ref 0–200)
HDL: 74 mg/dL (ref >40)
LDL Cholesterol: 137 mg/dL — ABNORMAL HIGH (ref 0–99)
Total CHOL/HDL Ratio: 3.3 ratio
Triglycerides: 149 mg/dL (ref <150)
VLDL: 30 mg/dL (ref 0–40)

## 2024-07-09 MED ORDER — ELACESTRANT HYDROCHLORIDE 345 MG PO TABS
345.0000 mg | ORAL_TABLET | Freq: Every day | ORAL | 2 refills | Status: DC
Start: 2024-07-09 — End: 2024-08-18

## 2024-07-09 NOTE — Progress Notes (Signed)
 [facility]   [phone] [phone]   Clinic Follow up Note   Patient Care Team: Geofm Glade PARAS, MD as PCP - General (Internal Medicine) Rosemarie Eather RAMAN, MD as Consulting Physician (Neurology) Ebbie Cough, MD as Consulting Physician (General Surgery) Lanny Callander, MD as Consulting Physician (Hematology) Dewey Rush, MD as Consulting Physician (Radiation Oncology)  Date of Service:  07/09/2024  CHIEF COMPLAINT: f/u of metastatic breast cancer  CURRENT THERAPY:  Elacestrant  and Ibrance   Oncology History   Malignant neoplasm of upper-inner quadrant of left breast in female, estrogen receptor positive (HCC) -pT2N0, Stage 1b, ER/PR+/HER2-, Grade 2, 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 S/p left total mastectomy. She declined antiestrogen therapy due to concerns of side effects. She lost follow up after 07/2017.   -After 6 months of left hip and ribcage pain she was found to have diffuse bone metastasis on 11/28/20 PET. This was confirmed on 12/09/20 bone biopsy --she started first-line Anastrozole  in 11/2020 and Ibrance  on 01/09/21, not very compliant with Ibrance   -progressed on PET 10/03/2022 -changed her tx to fulvestrant  injection and Verzenio . 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 - I recommend PIK3CA inhibitor, she declined due to the potential side effects. 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 Currently on Ibrance  (palbociclib ) at a low dose of 75 mg, two weeks on and one week off, due to previous low blood counts. Last PET scan in May showed disease progression. Reports lower back pain and left flank pain, but does not wish to pursue radiation therapy at this time. Discussed alternative treatment options including Fovastrant and Xeloda, but prefers to continue current treatment until the next scan at the end of the year. Discussed that worsening pain could indicate disease progression and may necessitate a change in treatment. 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 Anemia is contributing to fatigue but not severe enough to require blood transfusion. - Monitor hemoglobin levels - Return for blood count check in one month  Nausea, vomiting, and diarrhea secondary to cancer therapy -pt reports occasional nausea and diarrhea, with no more than one loose bowel movement per day. Vomiting episodes possibly related to other medications taken without medical advice. Discussed management of diarrhea with increased fluid intake and Imodium if necessary. 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 Pain is currently managed with Tylenol  and occasional hydrocodone . Does not wish  to pursue radiation therapy for pain management at this time. Discussed that radiation could be an option if pain becomes unmanageable. - Continue Tylenol  for pain management - Use hydrocodone  as needed for severe pain - Reassess pain management strategy if pain worsens  Plan - Per patient, she is tolerating Ibrance  well, but her daughter is very concerned about her intermittent nausea, diarrhea.  Her weight is stable. - We again discussed option of Piqray and fulvestrant  injection, she will think about it. - Lab and follow-up in a month - We also discussed option of palliative radiation to the bone mets in lumbar spine, if her pain gets worse.   SUMMARY OF ONCOLOGIC HISTORY: Oncology History Overview Note   Cancer Staging  Malignant neoplasm of upper-inner quadrant of left breast in female, estrogen receptor positive (HCC) Staging form: Breast, AJCC 8th Edition - Clinical stage from 06/18/2017: Stage IB (cT2, cN0, cM0, G2, ER+, PR+, HER2-) - Signed by Lanny Callander, MD on 06/24/2017 Histologic grading system: 3 grade system - Pathologic stage from 07/30/2017: Stage IA (pT2, pN0, cM0, G2, ER+, PR+, HER2-, Oncotype DX score: 23) - Signed by Lanny Callander, MD on 08/23/2017 Neoadjuvant therapy: No Nuclear grade: G2 Multigene prognostic tests performed: Oncotype DX Recurrence score range: Greater than or equal to 11 Histologic grading system: 3 grade system Residual tumor (R): R0 - None Laterality: Left     Malignant neoplasm of upper-inner quadrant of left breast in female, estrogen receptor positive (HCC)  06/17/2017 Mammogram   US  and MM diagnostic Breast Tomo Bilateral  IMPRESSION: 1. 3.4 palpable mass in the 10 o'clock position of the left breast is highly suspicious for malignancy.  2. No evidence of malignancy in the right breast.  3. Ultrasound of the left axilla shows normal sized axillary lymph nodes.   06/18/2017 Initial Biopsy   Diagnosis 06/18/17 Breast, left, needle core biopsy,  10:00 o'clock - INVASIVE MAMMARY CARCINOMA. At least G2  The malignant cells are negative for E-Cadherin, supporting a lobular phenotype.   06/18/2017 Initial Diagnosis   Malignant neoplasm of upper-inner quadrant of left breast in female, estrogen receptor positive (HCC)   06/18/2017 Receptors her2   ER 95%+, PR 25%+, both strong staining  HER2- Ki67 30%    07/30/2017 Surgery   LEFT TOTAL MASTECTOMY WITH AXILLARY SENTINEL LYMPH NODE BIOPSY by Dr. Ebbie on 07/30/17   07/30/2017 Pathology Results   Diagnosis 07/30/17 1. Breast, simple mastectomy, Left Total - INVASIVE LOBULAR CARCINOMA, GRADE 2, SPANNING 3.5 CM. - LOBULAR CARCINOMA IN SITU. - INVASIVE CARCINOMA IS BROADLY 0.1 CM FROM POSTERIOR MARGIN. - PERINEURAL INVASION PRESENT. - SEE ONCOLOGY TABLE. 2. Lymph node, sentinel, biopsy, Left Axillary - ONE OF ONE LYMPH NODES NEGATIVE FOR CARCINOMA (0/1). 3. Lymph node, sentinel, biopsy, Left - ONE OF ONE LYMPH NODES NEGATIVE FOR CARCINOMA (0/1).   07/30/2017 Oncotype testing   Oncotype 07/30/17 Recurrence score is 23, an intermediate risk With a 10-year risk of recurrence at 15% with tamoxifen  alone.     11/11/2020 Imaging   MRI Lumbar Spine  IMPRESSION: Diffuse osseous metastases. Chronic inferior L2 endplate deformity with mild height loss.   Likely late subacute pathologic fracture deformities involving the superior L1 endplate and right L3 lamina.   Multilevel spondylosis. Moderate to severe spinal canal and neural foraminal narrowing at the L3-4 and L4-5 levels.   Mild to moderate spinal  canal and neural foraminal narrowing at the L5-S1 level.   11/28/2020 PET scan   IMPRESSION: 1. Widespread and diffuse hypermetabolic bony metastases. 2. No evidence for soft tissue metastases in the neck, chest, abdomen, or pelvis. 3.  Aortic Atherosclerois (ICD10-170.0)     11/29/2020 -  Chemotherapy   Zometa  q74months starting 11/29/20. Held after first dose. Given her CKD will only  give when she has hypercalcemia and may switch to Prolia.     12/04/2020 Imaging   MRI Brain  IMPRESSION: No evidence of intracranial metastatic disease.   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   Mild chronic microvascular ischemic changes. Small chronic right cerebellar infarct.     12/07/2020 - 12/21/2020 Radiation Therapy   palliative RT to left hip with Dr Dewey on 12/07/20-12/21/20   12/09/2020 Pathology Results   FINAL MICROSCOPIC DIAGNOSIS:   A. BONE, BIOPSY:  - Metastatic carcinoma.  - See comment.   COMMENT:  The morphology is compatible with metastatic lobular breast carcinoma.  Estrogen receptor, progesterone receptor and HER2/neu will be performed.   ADDENDUM:   PROGNOSTIC INDICATOR RESULTS:   Immunohistochemical and morphometric analysis performed manually   The tumor cells are EQUIVOCAL for Her2 (2+).  Her2 by FISH will be performed and the results will be reported  separately.   Estrogen Receptor:       POSITIVE, 50%, MODERATE-STRONG STAINING  Progesterone Receptor:   POSITIVE, 15%, MODERATE-STRONG STAINING    11/2020 -  Anti-estrogen oral therapy   First-line Anti-estrogen therapy Anastrozole  1mg  daily starting in 11/2020 and Ibrance  starting in 12/2020    01/09/2021 -  Chemotherapy   Ibrance  3 weeks on/1 weeks off starting beginning of 01/09/21. Reduced to 75mg  starting with C2 (02/13/21) due to cytopenia and fatigue. Reduced to 75mg  2 weeks on/1 week off starting C3 on 03/13/21)    03/31/2021 PET scan   IMPRESSION: 1. Dramatic reduction in metabolic activity throughout the skeleton. Metabolic activity of diffuse sclerotic skeletal lesions is now essentially at background blood pool level. No focal metabolic activity. 2. Persistent diffuse confluent sclerotic skeletal metastasis unchanged. 3. No evidence of soft tissue breast cancer metastasis.   10/13/2021 PET scan   IMPRESSION: 1. Widespread osseous metastatic disease again noted  with areas of minimally increased metabolic activity within the sternal manubrium and L2 vertebral body. 2. No evidence of extra osseous metastatic disease.      Discussed the use of AI scribe software for clinical note transcription with the patient, who gave verbal consent to proceed.  History of Present Illness Shirley Wells is an 83 year old female with metastatic breast cancer who presents for follow-up. She is accompanied by her daughter, Dorthea.  She has been on Ibrance  since July, with a current regimen of 75 mg taken two weeks on and one week off. She experiences nausea and occasional diarrhea, which she attributes to the medication. Her weight remains stable at 137 pounds.  She experiences lower back pain and thoracic pain around the flank, related to bone metastasis in the spine confirmed by a PET scan in May. She uses Tylenol  for pain management and occasionally hydrocodone  for severe pain. There is no significant pain in the left hip.  Her hemoglobin level is 8.9, lower than her usual level of 11 when not on Ibrance . She feels tired. Her platelet count is normal.     All other systems were reviewed with the patient and are negative.  MEDICAL HISTORY:  Past Medical History:  Diagnosis Date   CHICKENPOX, HX OF 03/21/2010   Qualifier: Diagnosis of  By: Wilhemina RMA, Lucy     CVA (cerebral infarction) 02/2010   no residual deficits, a/w mid basal art stenosis, declined IC stent as rec by IR/neuro   Dyslipidemia    intol of statin due to >3x increase LFTs   GERD (gastroesophageal reflux disease)    hx   Hypertension, essential    Malignant neoplasm of upper-inner quadrant of left breast in female, estrogen receptor positive (HCC) 06/20/2017   Osteoarthritis    Stroke [facility])     SURGICAL HISTORY: Past Surgical History:  Procedure Laterality Date   CHOLECYSTECTOMY  1981   MASTECTOMY W/ SENTINEL NODE BIOPSY Left 07/30/2017   Procedure: LEFT TOTAL MASTECTOMY WITH AXILLARY  SENTINEL LYMPH NODE BIOPSY;  Surgeon: Ebbie Cough, MD;  Location: [facility] OR;  Service: General;  Laterality: Left;    I have reviewed the social history and family history with the patient and they are unchanged from previous note.  ALLERGIES:  is allergic to influenza vaccines, pneumococcal vaccines, simvastatin, tetanus toxoids, and zoster vaccine live.  MEDICATIONS:  Current Outpatient Medications  Medication Sig Dispense Refill   acetaminophen  (TYLENOL ) 650 MG CR tablet Take 1,300 mg by mouth every 8 (eight) hours as needed for pain.     amLODipine  (NORVASC ) 2.5 MG tablet Take 1 tablet (2.5 mg total) by mouth daily. 90 tablet 3   aspirin  81 MG tablet Take 81 mg by mouth 2 (two) times a week.     clopidogrel  (PLAVIX ) 75 MG tablet TAKE 1 TABLET(75 MG) BY MOUTH DAILY 90 tablet 1   cyclopentolate (CYCLODRYL,CYCLOGYL) 1 % ophthalmic solution Place 1 drop into the left eye once.     erythromycin ophthalmic ointment      hydrALAZINE  (APRESOLINE ) 50 MG tablet Take 1.5 tablets (75 mg total) by mouth 3 (three) times daily. 405 tablet 2   loratadine (CLARITIN) 10 MG tablet Take 10 mg by mouth daily as needed for allergies.     Menthol, Topical Analgesic, (BIOFREEZE EX) Apply 1 application topically daily as needed (muscle pain).     milk thistle 175 MG tablet Take 175 mg by mouth daily.     moxifloxacin  (VIGAMOX ) 0.5 % ophthalmic solution Place 1 drop into the left eye 3 (three) times daily.     Multiple Vitamin (MULTIVITAMIN) capsule Take 1 capsule by mouth daily.     palbociclib  (IBRANCE ) 75 MG tablet Take 1 tablet (75 mg total) by mouth daily. Take for 14 days on, 7 days off, repeat every 21 days. 14 tablet 2   prednisoLONE acetate (PRED FORTE) 1 % ophthalmic suspension SHAKE LIQUID AND INSTILL 1 DROP IN LEFT EYE TWICE DAILY FOR 2 WEEKS THEN STOP     ZIRGAN 0.15 % GEL Place 1 drop into the left eye 5 (five) times daily.     elacestrant  hydrochloride (ORSERDU ) 345 MG tablet Take 1 tablet (345  mg total) by mouth daily. Take with food. 30 tablet 2   No current facility-administered medications for this visit.    PHYSICAL EXAMINATION: ECOG PERFORMANCE STATUS: 2 - Symptomatic, <50% confined to bed  Vitals:   07/09/24 0841  BP: 138/60  Pulse: (!) 58  Resp: 17  Temp: 98.4 F (36.9 C)  SpO2: 99%   Wt Readings from Last 3 Encounters:  07/09/24 137 lb 8 oz (62.4 kg)  07/01/24 136 lb 14.5 oz (62.1 kg)  04/06/24 137 lb (62.1 kg)     GENERAL:alert,  no distress and comfortable SKIN: skin color, texture, turgor are normal, no rashes or significant lesions EYES: normal, Conjunctiva are pink and non-injected, sclera clear NECK: supple, thyroid  normal size, non-tender, without nodularity LYMPH:  no palpable lymphadenopathy in the cervical, axillary  LUNGS: clear to auscultation and percussion with normal breathing effort HEART: regular rate & rhythm and no murmurs and no lower extremity edema ABDOMEN:abdomen soft, non-tender and normal bowel sounds Musculoskeletal:no cyanosis of digits and no clubbing  NEURO: alert & oriented x 3 with fluent speech, no focal motor/sensory deficits  Physical Exam MEASUREMENTS: Weight- 137.  LABORATORY DATA:  I have reviewed the data as listed    Latest Ref Rng & Units 07/09/2024    8:11 AM 04/24/2024   10:04 AM 03/02/2024   10:16 AM  CBC  WBC 4.0 - 10.5 K/uL 2.7  6.1  6.6   Hemoglobin 12.0 - 15.0 g/dL 8.9  88.4  88.0   Hematocrit 36.0 - 46.0 % 26.5  33.8  36.0   Platelets 150 - 400 K/uL 160  219  204         Latest Ref Rng & Units 07/09/2024    8:11 AM 04/24/2024   10:04 AM 03/02/2024   10:16 AM  CMP  Glucose 70 - 99 mg/dL 897  899  882   BUN 8 - 23 mg/dL 35  38  43   Creatinine 0.44 - 1.00 mg/dL 7.97  8.22  8.17   Sodium 135 - 145 mmol/L 142  142  143   Potassium 3.5 - 5.1 mmol/L 4.1  3.7  4.8   Chloride 98 - 111 mmol/L 105  108  107   CO2 22 - 32 mmol/L 27  27  27    Calcium 8.9 - 10.3 mg/dL 9.8  89.7  9.9   Total Protein 6.5 -  8.1 g/dL 6.5  7.3  7.4   Total Bilirubin 0.0 - 1.2 mg/dL 0.4  0.5  0.4   Alkaline Phos 38 - 126 U/L 190  215  225   AST 15 - 41 U/L 33  57  32   ALT 0 - 44 U/L 15  56  22       RADIOGRAPHIC STUDIES: I have personally reviewed the radiological images as listed and agreed with the findings in the report. No results found.    No orders of the defined types were placed in this encounter.  All questions were answered. The patient knows to call the clinic with any problems, questions or concerns. No barriers to learning was detected. The total time spent in the appointment was 40 minutes, including review of chart and various tests results, discussions about plan of care and coordination of care plan     Onita Mattock, MD 07/09/2024

## 2024-07-09 NOTE — Assessment & Plan Note (Signed)
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 S/p left total mastectomy. She declined antiestrogen therapy due to concerns of side effects. She lost follow up after 07/2017.   -After 6 months of left hip and ribcage pain she was found to have diffuse bone metastasis on 11/28/20 PET. This was confirmed on 12/09/20 bone biopsy --she started first-line Anastrozole  in 11/2020 and Ibrance  on 01/09/21, not very compliant with Ibrance   -progressed on PET 10/03/2022 -changed her tx to fulvestrant  injection and Verzenio . 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 - I recommend PIK3CA inhibitor, she declined due to the potential side effects. -added Ibrance  to elacestrant  in June 2025

## 2024-07-10 ENCOUNTER — Other Ambulatory Visit: Payer: Self-pay

## 2024-07-10 ENCOUNTER — Ambulatory Visit (HOSPITAL_COMMUNITY)
Admission: RE | Admit: 2024-07-10 | Discharge: 2024-07-10 | Disposition: A | Discharge status: Home / Self Care | Attending: Ophthalmology | Admitting: Ophthalmology

## 2024-07-10 ENCOUNTER — Ambulatory Visit (HOSPITAL_COMMUNITY): Payer: Self-pay | Admitting: Anesthesiology

## 2024-07-10 ENCOUNTER — Encounter (HOSPITAL_COMMUNITY): Payer: Self-pay | Admitting: Ophthalmology

## 2024-07-10 ENCOUNTER — Encounter (HOSPITAL_COMMUNITY)
Admission: RE | Disposition: A | Discharge status: Home / Self Care | Payer: Self-pay | Source: Home / Self Care | Attending: Ophthalmology

## 2024-07-10 ENCOUNTER — Ambulatory Visit (HOSPITAL_BASED_OUTPATIENT_CLINIC_OR_DEPARTMENT_OTHER): Payer: Self-pay | Admitting: Anesthesiology

## 2024-07-10 DIAGNOSIS — H5712 Ocular pain, left eye: Secondary | ICD-10-CM | POA: Diagnosis present

## 2024-07-10 DIAGNOSIS — Z853 Personal history of malignant neoplasm of breast: Secondary | ICD-10-CM | POA: Insufficient documentation

## 2024-07-10 DIAGNOSIS — I129 Hypertensive chronic kidney disease with stage 1 through stage 4 chronic kidney disease, or unspecified chronic kidney disease: Secondary | ICD-10-CM | POA: Diagnosis not present

## 2024-07-10 DIAGNOSIS — E785 Hyperlipidemia, unspecified: Secondary | ICD-10-CM | POA: Diagnosis not present

## 2024-07-10 DIAGNOSIS — N183 Chronic kidney disease, stage 3 unspecified: Secondary | ICD-10-CM | POA: Insufficient documentation

## 2024-07-10 DIAGNOSIS — Z85830 Personal history of malignant neoplasm of bone: Secondary | ICD-10-CM | POA: Insufficient documentation

## 2024-07-10 DIAGNOSIS — K219 Gastro-esophageal reflux disease without esophagitis: Secondary | ICD-10-CM | POA: Insufficient documentation

## 2024-07-10 DIAGNOSIS — H4712 Papilledema associated with decreased ocular pressure: Secondary | ICD-10-CM | POA: Insufficient documentation

## 2024-07-10 DIAGNOSIS — H25812 Combined forms of age-related cataract, left eye: Secondary | ICD-10-CM

## 2024-07-10 DIAGNOSIS — R7303 Prediabetes: Secondary | ICD-10-CM | POA: Insufficient documentation

## 2024-07-10 HISTORY — PX: INSERTION, STENT, DRUG-ELUTING, LACRIMAL CANALICULUS: SHX7453

## 2024-07-10 HISTORY — PX: CATARACT EXTRACTION W/PHACO: SHX586

## 2024-07-10 LAB — CANCER ANTIGEN 27.29: CA 27.29: 547.8 U/mL — ABNORMAL HIGH (ref 0.0–38.6)

## 2024-07-10 SURGERY — PHACOEMULSIFICATION, CATARACT, WITH IOL INSERTION
Anesthesia: Monitor Anesthesia Care | Site: Eye | Laterality: Left

## 2024-07-10 MED ORDER — ONDANSETRON HCL 4 MG/2ML IJ SOLN
INTRAMUSCULAR | Status: AC
  Filled 2024-07-10: qty 2

## 2024-07-10 MED ORDER — LIDOCAINE HCL (PF) 1 % IJ SOLN
INTRAMUSCULAR | Status: DC | PRN
  Administered 2024-07-10: 1 mL

## 2024-07-10 MED ORDER — DEXAMETHASONE 0.4 MG OP INST
VAGINAL_INSERT | OPHTHALMIC | Status: DC | PRN
  Administered 2024-07-10: .4 mg via OPHTHALMIC

## 2024-07-10 MED ORDER — MOXIFLOXACIN HCL 5 MG/ML IO SOLN
INTRAOCULAR | Status: DC | PRN
  Administered 2024-07-10: .2 mL via INTRACAMERAL

## 2024-07-10 MED ORDER — PHENYLEPHRINE-KETOROLAC 1-0.3 % IO SOLN
INTRAOCULAR | Status: DC | PRN
  Administered 2024-07-10: 500 mL via OPHTHALMIC

## 2024-07-10 MED ORDER — BSS IO SOLN
INTRAOCULAR | Status: DC | PRN
  Administered 2024-07-10: 15 mL via INTRAOCULAR

## 2024-07-10 MED ORDER — DEXMEDETOMIDINE HCL IN NACL 80 MCG/20ML IV SOLN
INTRAVENOUS | Status: AC
  Filled 2024-07-10: qty 20

## 2024-07-10 MED ORDER — LIDOCAINE HCL 3.5 % OP GEL
1.0000 | Freq: Once | OPHTHALMIC | Status: AC
  Administered 2024-07-10: 1 via OPHTHALMIC

## 2024-07-10 MED ORDER — LACTATED RINGERS IV SOLN: INTRAVENOUS | Status: DC

## 2024-07-10 MED ORDER — TROPICAMIDE 1 % OP SOLN
1.0000 [drp] | OPHTHALMIC | Status: AC | PRN
  Administered 2024-07-10 (×3): 1 [drp] via OPHTHALMIC

## 2024-07-10 MED ORDER — MIDAZOLAM HCL 2 MG/2ML IJ SOLN
INTRAMUSCULAR | Status: DC | PRN
  Administered 2024-07-10: 1 mg via INTRAVENOUS

## 2024-07-10 MED ORDER — STERILE WATER FOR IRRIGATION IR SOLN
Status: DC | PRN
  Administered 2024-07-10: 250 mL

## 2024-07-10 MED ORDER — POVIDONE-IODINE 5 % OP SOLN
OPHTHALMIC | Status: DC | PRN
  Administered 2024-07-10: 1 via OPHTHALMIC

## 2024-07-10 MED ORDER — MIDAZOLAM HCL 2 MG/2ML IJ SOLN
INTRAMUSCULAR | Status: AC
Start: 2024-07-10 — End: 2024-07-10
  Filled 2024-07-10: qty 2

## 2024-07-10 MED ORDER — SODIUM CHLORIDE 0.9% FLUSH
INTRAVENOUS | Status: DC | PRN
Start: 2024-07-10 — End: 2024-07-10
  Administered 2024-07-10: 10 mL via INTRAVENOUS

## 2024-07-10 MED ORDER — TETRACAINE HCL 0.5 % OP SOLN
1.0000 [drp] | OPHTHALMIC | Status: AC | PRN
  Administered 2024-07-10 (×3): 1 [drp] via OPHTHALMIC

## 2024-07-10 MED ORDER — DEXAMETHASONE 0.4 MG OP INST
VAGINAL_INSERT | OPHTHALMIC | Status: AC
  Filled 2024-07-10: qty 1

## 2024-07-10 MED ORDER — PHENYLEPHRINE HCL 2.5 % OP SOLN
1.0000 [drp] | OPHTHALMIC | Status: AC | PRN
  Administered 2024-07-10 (×3): 1 [drp] via OPHTHALMIC

## 2024-07-10 MED ORDER — SODIUM HYALURONATE 23MG/ML IO SOSY
PREFILLED_SYRINGE | INTRAOCULAR | Status: DC | PRN
  Administered 2024-07-10: .6 mL via INTRAOCULAR

## 2024-07-10 MED ORDER — SODIUM HYALURONATE 10 MG/ML IO SOLUTION
PREFILLED_SYRINGE | INTRAOCULAR | Status: DC | PRN
  Administered 2024-07-10: .85 mL via INTRAOCULAR

## 2024-07-10 SURGICAL SUPPLY — 11 items
CLOTH BEACON ORANGE TIMEOUT ST (SAFETY) ×1 IMPLANT
EYE SHIELD UNIVERSAL CLEAR (GAUZE/BANDAGES/DRESSINGS) IMPLANT
FEE CATARACT SUITE SIGHTPATH (MISCELLANEOUS) ×1 IMPLANT
GLOVE BIOGEL PI IND STRL 7.0 (GLOVE) ×2 IMPLANT
LENS IOL TECNIS EYHANCE 20.0 (Intraocular Lens) IMPLANT
NDL HYPO 18GX1.5 BLUNT FILL (NEEDLE) ×1 IMPLANT
NEEDLE HYPO 18GX1.5 BLUNT FILL (NEEDLE) ×1 IMPLANT
PAD ARMBOARD POSITIONER FOAM (MISCELLANEOUS) ×1 IMPLANT
SYR TB 1ML LL NO SAFETY (SYRINGE) ×1 IMPLANT
TAPE SURG TRANSPORE 1 IN (GAUZE/BANDAGES/DRESSINGS) IMPLANT
WATER STERILE IRR 250ML POUR (IV SOLUTION) ×1 IMPLANT

## 2024-07-10 NOTE — Anesthesia Postprocedure Evaluation (Signed)
 Anesthesia Post Note  Patient: Shirley Wells  Procedure(s) Performed: PHACOEMULSIFICATION, CATARACT, WITH IOL INSERTION (Left: Eye) INSERTION, STENT, DRUG-ELUTING, LACRIMAL CANALICULUS (Left: Eye)  Patient location during evaluation: Phase II Anesthesia Type: MAC Level of consciousness: awake and alert Pain management: pain level controlled Vital Signs Assessment: post-procedure vital signs reviewed and stable Respiratory status: spontaneous breathing, nonlabored ventilation and respiratory function stable Cardiovascular status: stable and blood pressure returned to baseline Postop Assessment: no apparent nausea or vomiting Anesthetic complications: no   There were no known notable events for this encounter.   Last Vitals:  Vitals:   07/10/24 1205 07/10/24 1254  BP: (!) 169/63 (!) 148/57  Pulse: 71 67  Resp: 10 12  Temp: (!) 36.4 C 36.9 C  SpO2: 100% 99%    Last Pain:  Vitals:   07/10/24 1254  TempSrc: Axillary  PainSc: 0-No pain                 Luella Gardenhire L Altus Zaino

## 2024-07-10 NOTE — Interval H&P Note (Signed)
 History and Physical Interval Note:  07/10/2024 12:25 PM  Shirley Wells  has presented today for surgery, with the diagnosis of combined forms age related cataract, left eye.  The various methods of treatment have been discussed with the patient and family. After consideration of risks, benefits and other options for treatment, the patient has consented to  Procedure(s) with comments: PHACOEMULSIFICATION, CATARACT, WITH IOL INSERTION (Left) - CDE: INSERTION, STENT, DRUG-ELUTING, LACRIMAL CANALICULUS (Left) as a surgical intervention.  The patient's history has been reviewed, patient examined, no change in status, stable for surgery.  I have reviewed the patient's chart and labs.  Questions were answered to the patient's satisfaction.     HARRIE AGENT

## 2024-07-10 NOTE — Transfer of Care (Signed)
 Immediate Anesthesia Transfer of Care Note  Patient: Shirley Wells  Procedure(s) Performed: PHACOEMULSIFICATION, CATARACT, WITH IOL INSERTION (Left: Eye) INSERTION, STENT, DRUG-ELUTING, LACRIMAL CANALICULUS (Left: Eye)  Patient Location: Short Stay  Anesthesia Type:MAC  Level of Consciousness: awake, alert , and patient cooperative  Airway & Oxygen Therapy: Patient Spontanous Breathing  Post-op Assessment: Report given to RN, Post -op Vital signs reviewed and stable, and Patient moving all extremities X 4  Post vital signs: Reviewed and stable  Last Vitals:  Vitals Value Taken Time  BP 148/57 07/10/24 12:54  Temp 36.9 C 07/10/24 12:54  Pulse 67 07/10/24 12:54  Resp 12 07/10/24 12:54  SpO2 99 % 07/10/24 12:54    Last Pain:  Vitals:   07/10/24 1254  TempSrc: Axillary  PainSc: 0-No pain         Complications: No notable events documented.

## 2024-07-10 NOTE — Op Note (Signed)
 Date of procedure: 07/10/24  Pre-operative diagnosis: Visually significant age-related combined cataract, Left Eye (H25.812)  Post-operative diagnosis:  Visually significant age-related combined cataract, Left Eye (H25.812) 2.   Pain and inflammation following cataract surgery, Left Eye (H57.12)  Procedure:  Removal of cataract via phacoemulsification and insertion of intra-ocular lens [employer] DIB00 +20.0D into the capsular bag of the Left Eye 2. Placement of Dextenza  Implant, Left Lower Lid  Attending surgeon: Lynwood LABOR. Nzinga Ferran, MD, MA  Anesthesia: MAC, Topical Akten   Complications: None  Estimated Blood Loss: <32mL (minimal)  Specimens: None  Implants: As above  Indications:  Visually significant age-related cataract, Left Eye  Procedure:  The patient was seen and identified in the pre-operative area. The operative eye was identified and dilated.  The operative eye was marked.  Topical anesthesia was administered to the operative eye.     The patient was then to the operative suite and placed in the supine position.  A timeout was performed confirming the patient, procedure to be performed, and all other relevant information.   The patient's face was prepped and draped in the usual fashion for intra-ocular surgery.  A lid speculum was placed into the operative eye and the surgical microscope moved into place and focused.  An inferotemporal paracentesis was created using a 20 gauge paracentesis blade. Omidria  was injected into the anterior chamber. Shugarcaine was injected into the anterior chamber.  Viscoelastic was injected into the anterior chamber.  A temporal clear-corneal main wound incision was created using a 2.35mm microkeratome.  A continuous curvilinear capsulorrhexis was initiated using an irrigating cystitome and completed using capsulorrhexis forceps.  Hydrodissection and hydrodeliniation were performed.  Viscoelastic was injected into the anterior chamber.  A  phacoemulsification handpiece and a chopper as a second instrument were used to remove the nucleus and epinucleus. The irrigation/aspiration handpiece was used to remove any remaining cortical material.   The capsular bag was reinflated with viscoelastic, checked, and found to be intact.  The intraocular lens was inserted into the capsular bag.  The irrigation/aspiration handpiece was used to remove any remaining viscoelastic.  The clear corneal wound and paracentesis wounds were then hydrated and checked with Weck-Cels to be watertight.  0.1mL of moxifloxacin  was injected into the anterior chamber.  The lid-speculum was removed. The lower punctum was dilated. A Dextenza  implant was placed in the lower canaliculus without complication.   The drape was removed.  The patient's face was cleaned with a wet and dry 4x4.   A clear shield was taped over the eye. The patient was taken to the post-operative care unit in good condition, having tolerated the procedure well.  Post-Op Instructions: The patient will follow up at [facility] for a same day post-operative evaluation and will receive all other orders and instructions.

## 2024-07-10 NOTE — Anesthesia Preprocedure Evaluation (Addendum)
 Anesthesia Evaluation  Patient identified by MRN, date of birth, ID band Patient awake    Reviewed: Allergy & Precautions, H&P , NPO status , Patient's Chart, lab work & pertinent test results, reviewed documented beta blocker date and time   Airway Mallampati: II  TM Distance: >3 FB Neck ROM: full    Dental no notable dental hx. 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  Reproductive/Obstetrics negative OB ROS                              Anesthesia Physical Anesthesia Plan  ASA: 3  Anesthesia Plan: MAC   Post-op Pain Management: Minimal or no pain anticipated   Induction:   PONV Risk Score and Plan: Midazolam   Airway Management Planned: Nasal Cannula and Natural Airway  Additional Equipment: None  Intra-op Plan:   Post-operative Plan:   Informed Consent: I have reviewed the patients History and Physical, chart, labs and discussed the procedure including the risks, benefits and alternatives for the proposed anesthesia with the patient or authorized representative who has indicated his/her understanding and acceptance.     Dental Advisory Given  Plan Discussed with: CRNA  Anesthesia Plan Comments:          Anesthesia Quick Evaluation

## 2024-07-10 NOTE — Discharge Instructions (Addendum)
 Please discharge patient when stable, will follow up today with Dr. June Leap at the [facility] office immediately following discharge.  Leave shield in place until visit.  All paperwork with discharge instructions will be given at the office.  [facility] Address:  [address]  [place], [place] [address]

## 2024-07-11 ENCOUNTER — Ambulatory Visit: Payer: Self-pay | Admitting: Hematology

## 2024-07-13 ENCOUNTER — Encounter (HOSPITAL_COMMUNITY): Payer: Self-pay | Admitting: Ophthalmology

## 2024-07-14 ENCOUNTER — Other Ambulatory Visit: Payer: Self-pay

## 2024-07-14 ENCOUNTER — Telehealth: Payer: Self-pay

## 2024-07-14 NOTE — Telephone Encounter (Signed)
 LVM requesting if pt would please give Dr. Demetra office a call regarding pt's recent tumor marker lab results.  Awaiting return call from pt.  Also, sent staff message to Dr. Lanny regarding the PET Scan order because there is no order in EPIC.  Awaiting Dr. Demetra response regarding the PET Scan order.

## 2024-07-16 ENCOUNTER — Other Ambulatory Visit: Payer: Self-pay

## 2024-07-16 DIAGNOSIS — C50212 Malignant neoplasm of upper-inner quadrant of left female breast: Secondary | ICD-10-CM

## 2024-07-24 ENCOUNTER — Telehealth: Payer: Self-pay

## 2024-07-24 ENCOUNTER — Other Ambulatory Visit: Payer: Self-pay | Admitting: Hematology

## 2024-07-24 MED ORDER — HYDROCODONE-ACETAMINOPHEN 5-325 MG PO TABS: 1.0000 | ORAL_TABLET | Freq: Three times a day (TID) | ORAL | 0 refills | Status: DC | PRN

## 2024-07-24 NOTE — Telephone Encounter (Signed)
 Pt's daughter called stating that the pt is having pain and usually takes Acetaminophen  for the pain but the Acetaminophen  is not helping.  Pt's daughter stated the pt is having 8/10 bone pain and is requesting if the pt could get a prescription for Norco.  Pt's daughter stated that the pt had a old bottle of Norco that Dr. Lanny had prescribed years ago which the pt started taking since the pt's pain has increased.  Pt's daughter stated that the Norco was helping the pt's pain but the pt is almost out of the medication; therefore, requesting if Dr. Lanny could prescribe more Norco.  Stated Dr. Lanny and her Team are out of the office but this nurse will make them aware.  Also, stated that our office will be closed on Monday and a response may not be received until Tuesday, 07/28/2024.  Instructed pt's daughter to take pt to the ED over the weekend for pain management if unable to get a prescription for pain medication.  Notified Dr. Lanny and her team.

## 2024-07-28 ENCOUNTER — Encounter: Payer: Self-pay | Admitting: Hematology

## 2024-07-29 ENCOUNTER — Other Ambulatory Visit: Payer: Self-pay

## 2024-07-29 NOTE — Progress Notes (Signed)
 Specialty Pharmacy Refill Coordination Note  Shirley Wells is a 83 y.o. female contacted today regarding refills of specialty medication(s) Palbociclib  (IBRANCE )   Patient requested Delivery   Delivery date: 08/06/24   Verified address: [address]   Medication will be filled on 09.10.25.

## 2024-07-31 ENCOUNTER — Encounter (HOSPITAL_COMMUNITY)
Admission: RE | Admit: 2024-07-31 | Discharge: 2024-07-31 | Disposition: A | Discharge status: Home / Self Care | Source: Ambulatory Visit | Attending: Hematology | Admitting: Hematology

## 2024-07-31 DIAGNOSIS — C7951 Secondary malignant neoplasm of bone: Secondary | ICD-10-CM | POA: Diagnosis not present

## 2024-07-31 DIAGNOSIS — Z170 Estrogen receptor positive status [ER+]: Secondary | ICD-10-CM | POA: Insufficient documentation

## 2024-07-31 DIAGNOSIS — C50212 Malignant neoplasm of upper-inner quadrant of left female breast: Secondary | ICD-10-CM | POA: Diagnosis not present

## 2024-07-31 DIAGNOSIS — C787 Secondary malignant neoplasm of liver and intrahepatic bile duct: Secondary | ICD-10-CM | POA: Diagnosis not present

## 2024-07-31 LAB — GLUCOSE, CAPILLARY: Glucose-Capillary: 121 mg/dL — ABNORMAL HIGH (ref 70–99)

## 2024-07-31 MED ORDER — FLUDEOXYGLUCOSE F - 18 (FDG) INJECTION
6.8400 | Freq: Once | INTRAVENOUS | Status: AC
  Administered 2024-07-31: 6.84 via INTRAVENOUS

## 2024-08-05 ENCOUNTER — Other Ambulatory Visit: Payer: Self-pay

## 2024-08-06 ENCOUNTER — Other Ambulatory Visit: Payer: Self-pay

## 2024-08-06 NOTE — Progress Notes (Signed)
 Specialty Pharmacy Ongoing Clinical Assessment Note  Shirley Wells is a 83 y.o. female who is being followed by the specialty pharmacy service for RxSp Oncology   Patient's specialty medication(s) reviewed today: Palbociclib  (IBRANCE )   Missed doses in the last 4 weeks: 0   Patient/Caregiver did not have any additional questions or concerns.   Therapeutic benefit summary: Patient is NOT achieving benefit (Increase in CA 27.29 levels and increase in bone pain possibly indicating disease progression)   Adverse events/side effects summary: No adverse events/side effects   Patient's therapy is appropriate to: Continue    Goals Addressed             This Visit's Progress    Maintain optimal adherence to therapy   On track    Patient is on track. Patient will maintain adherence and be monitored by provider to determine if a change in treatment plan is warranted         Follow up: 3 months  [facility] Specialty Pharmacist

## 2024-08-17 ENCOUNTER — Inpatient Hospital Stay: Admitting: Hematology

## 2024-08-17 ENCOUNTER — Inpatient Hospital Stay: Attending: Hematology

## 2024-08-17 VITALS — BP 146/62 | HR 83 | Temp 98.3°F | Resp 17 | Ht 64.5 in | Wt 132.1 lb

## 2024-08-17 DIAGNOSIS — C50212 Malignant neoplasm of upper-inner quadrant of left female breast: Secondary | ICD-10-CM | POA: Diagnosis not present

## 2024-08-17 DIAGNOSIS — C7951 Secondary malignant neoplasm of bone: Secondary | ICD-10-CM | POA: Insufficient documentation

## 2024-08-17 DIAGNOSIS — C787 Secondary malignant neoplasm of liver and intrahepatic bile duct: Secondary | ICD-10-CM | POA: Insufficient documentation

## 2024-08-17 DIAGNOSIS — R634 Abnormal weight loss: Secondary | ICD-10-CM | POA: Insufficient documentation

## 2024-08-17 DIAGNOSIS — Z9012 Acquired absence of left breast and nipple: Secondary | ICD-10-CM | POA: Diagnosis not present

## 2024-08-17 DIAGNOSIS — Z923 Personal history of irradiation: Secondary | ICD-10-CM | POA: Diagnosis not present

## 2024-08-17 DIAGNOSIS — D630 Anemia in neoplastic disease: Secondary | ICD-10-CM | POA: Insufficient documentation

## 2024-08-17 DIAGNOSIS — Z9221 Personal history of antineoplastic chemotherapy: Secondary | ICD-10-CM | POA: Insufficient documentation

## 2024-08-17 DIAGNOSIS — Z170 Estrogen receptor positive status [ER+]: Secondary | ICD-10-CM | POA: Insufficient documentation

## 2024-08-17 DIAGNOSIS — Z5111 Encounter for antineoplastic chemotherapy: Secondary | ICD-10-CM | POA: Diagnosis not present

## 2024-08-17 DIAGNOSIS — Z1721 Progesterone receptor positive status: Secondary | ICD-10-CM | POA: Diagnosis not present

## 2024-08-17 DIAGNOSIS — G893 Neoplasm related pain (acute) (chronic): Secondary | ICD-10-CM | POA: Diagnosis not present

## 2024-08-17 DIAGNOSIS — N184 Chronic kidney disease, stage 4 (severe): Secondary | ICD-10-CM | POA: Diagnosis not present

## 2024-08-17 DIAGNOSIS — Z1732 Human epidermal growth factor receptor 2 negative status: Secondary | ICD-10-CM | POA: Insufficient documentation

## 2024-08-17 LAB — CBC WITH DIFFERENTIAL/PLATELET
Abs Immature Granulocytes: 0.02 K/uL (ref 0.00–0.07)
Basophils Absolute: 0.1 K/uL (ref 0.0–0.1)
Basophils Relative: 3 %
Eosinophils Absolute: 0.1 K/uL (ref 0.0–0.5)
Eosinophils Relative: 1 %
HCT: 26.8 % — ABNORMAL LOW (ref 36.0–46.0)
Hemoglobin: 9 g/dL — ABNORMAL LOW (ref 12.0–15.0)
Immature Granulocytes: 1 %
Lymphocytes Relative: 29 %
Lymphs Abs: 1.2 K/uL (ref 0.7–4.0)
MCH: 40.9 pg — ABNORMAL HIGH (ref 26.0–34.0)
MCHC: 33.6 g/dL (ref 30.0–36.0)
MCV: 121.8 fL — ABNORMAL HIGH (ref 80.0–100.0)
Monocytes Absolute: 0.2 K/uL (ref 0.1–1.0)
Monocytes Relative: 5 %
Neutro Abs: 2.5 K/uL (ref 1.7–7.7)
Neutrophils Relative %: 61 %
Platelets: 188 K/uL (ref 150–400)
RBC: 2.2 MIL/uL — ABNORMAL LOW (ref 3.87–5.11)
RDW: 16.8 % — ABNORMAL HIGH (ref 11.5–15.5)
WBC: 4 K/uL (ref 4.0–10.5)
nRBC: 0 % (ref 0.0–0.2)

## 2024-08-17 LAB — COMPREHENSIVE METABOLIC PANEL WITH GFR
ALT: 22 U/L (ref 0–44)
AST: 80 U/L — ABNORMAL HIGH (ref 15–41)
Albumin: 3.7 g/dL (ref 3.5–5.0)
Alkaline Phosphatase: 226 U/L — ABNORMAL HIGH (ref 38–126)
Anion gap: 10 (ref 5–15)
BUN: 31 mg/dL — ABNORMAL HIGH (ref 8–23)
CO2: 26 mmol/L (ref 22–32)
Calcium: 10.3 mg/dL (ref 8.9–10.3)
Chloride: 106 mmol/L (ref 98–111)
Creatinine, Ser: 1.91 mg/dL — ABNORMAL HIGH (ref 0.44–1.00)
GFR, Estimated: 26 mL/min — ABNORMAL LOW (ref >60)
Glucose, Bld: 107 mg/dL — ABNORMAL HIGH (ref 70–99)
Potassium: 4.1 mmol/L (ref 3.5–5.1)
Sodium: 142 mmol/L (ref 135–145)
Total Bilirubin: 0.7 mg/dL (ref 0.0–1.2)
Total Protein: 7.1 g/dL (ref 6.5–8.1)

## 2024-08-17 MED ORDER — HYDROCODONE-ACETAMINOPHEN 10-325 MG PO TABS: 1.0000 | ORAL_TABLET | Freq: Three times a day (TID) | ORAL | 0 refills | Status: DC | PRN

## 2024-08-17 MED ORDER — CAPIVASERTIB 200 MG PO TBPK
200.0000 mg | ORAL_TABLET | Freq: Two times a day (BID) | ORAL | 0 refills | Status: DC
  Filled 2024-08-19: qty 32, 28d supply, fill #0

## 2024-08-17 NOTE — Progress Notes (Signed)
 [name]   [phone] [phone]   Clinic Follow up Note   Patient Care Team: Geofm Glade PARAS, MD as PCP - General (Internal Medicine) Rosemarie Eather RAMAN, MD as Consulting Physician (Neurology) Ebbie Cough, MD as Consulting Physician (General Surgery) Lanny Callander, MD as Consulting Physician (Hematology) Dewey Rush, MD as Consulting Physician (Radiation Oncology)  Date of Service:  08/17/2024  CHIEF COMPLAINT: f/u of metastatic breast cancer  CURRENT THERAPY:  Elacestrant  and Ibrance   Oncology History   Malignant neoplasm of upper-inner quadrant of left breast in female, estrogen receptor positive (HCC) -pT2N0, Stage 1b, ER/PR+/HER2-, Grade 2, 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 S/p left total mastectomy. She declined antiestrogen therapy due to concerns of side effects. She lost follow up after 07/2017.   -After 6 months of left hip and ribcage pain she was found to have diffuse bone metastasis on 11/28/20 PET. This was confirmed on 12/09/20 bone biopsy --she started first-line Anastrozole  in 11/2020 and Ibrance  on 01/09/21, not very compliant with Ibrance   -progressed on PET 10/03/2022 -changed her tx to fulvestrant  injection and Verzenio . 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 - I recommend PIK3CA inhibitor, she declined due to the potential side effects. 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 Current treatment with Ibrance  and Ostritape is ineffective. - Discontinue Ibrance  and Elacestrant   - Initiate Capivasertib  (Truqap ) and fulvestrant  regimen targeting PIK3CA mutation. - Monitor blood sugar levels regularly due to risk of hyperglycemia from Capivasertib  - Schedule fulvestrant  injections every two weeks for three doses, then monthly. - Educate on potential side effects especially hyperglycemia, skin rash, diarrhea and management. - Coordinate with pharmacy for medication delivery.  Cancer-related pain Pain in left ribcage and thoracic spine due to bone metastases. Current hydrocodone  regimen is partially effective. Radiation therapy deferred due to previous radiation and diffuse metastases. - Continue hydrocodone , adjust dose to 10 mg every 6 hours as needed. - Consult radiation oncology to review previous radiation records and assess potential for further therapy. - Monitor pain levels and adjust treatment as needed.  Anemia in neoplastic disease Mild anemia with hemoglobin at 9 g/dL, likely related to neoplastic disease. - Monitor hemoglobin levels regularly.  Unintentional weight loss Weight decreased to 132 lbs from 140 lbs. Appetite variable, possibly affected by cancer and treatment. - Encourage nutritional intake with high-protein snacks and liquid nutrition as needed. - Monitor weight and nutritional status.  Chronic kidney disease, stage IV Chronic kidney disease with emphasis on maintaining hydration. - Encourage adequate fluid intake.  Plan - I personally reviewed her restaging PET scan, which unfortunately showed disease progression - Will stop elacestrant  and Ibrance  - I called in Truqap , will reduce dose to 200 mg twice daily for 4 days every week - Plan to start fulvestrant  injection next week -  Follow-up in 3  weeks   SUMMARY OF ONCOLOGIC HISTORY: Oncology History Overview Note   Cancer Staging  Malignant neoplasm of upper-inner quadrant of left breast in female, estrogen receptor positive (HCC) Staging form: Breast, AJCC 8th Edition - Clinical stage from 06/18/2017: Stage IB (cT2, cN0, cM0, G2, ER+, PR+, HER2-) - Signed by Lanny Callander, MD on 06/24/2017 Histologic grading system: 3 grade system - Pathologic stage from 07/30/2017: Stage IA (pT2, pN0, cM0, G2, ER+, PR+, HER2-, Oncotype DX score: 23) - Signed by Lanny Callander, MD on 08/23/2017 Neoadjuvant therapy: No Nuclear grade: G2 Multigene prognostic tests performed: Oncotype DX Recurrence score range: Greater than or equal to 11 Histologic grading system: 3 grade system Residual tumor (R): R0 - None Laterality: Left     Malignant neoplasm of upper-inner quadrant of left breast in female, estrogen receptor positive (HCC)  06/17/2017 Mammogram   US  and MM diagnostic Breast Tomo Bilateral  IMPRESSION: 1. 3.4 palpable mass in the 10 o'clock position of the left breast is highly suspicious for malignancy.  2. No evidence of malignancy in the right breast.  3. Ultrasound of the left axilla shows normal sized axillary lymph nodes.   06/18/2017 Initial Biopsy   Diagnosis 06/18/17 Breast, left, needle core biopsy, 10:00 o'clock - INVASIVE MAMMARY CARCINOMA. At least G2  The malignant cells are negative for E-Cadherin, supporting a lobular phenotype.   06/18/2017 Initial Diagnosis   Malignant neoplasm of upper-inner quadrant of left breast in female, estrogen receptor positive (HCC)   06/18/2017 Receptors her2   ER 95%+, PR 25%+, both strong staining  HER2- Ki67 30%    07/30/2017 Surgery   LEFT TOTAL MASTECTOMY WITH AXILLARY SENTINEL LYMPH NODE BIOPSY by Dr. Ebbie on 07/30/17   07/30/2017 Pathology Results   Diagnosis 07/30/17 1. Breast, simple mastectomy, Left Total - INVASIVE LOBULAR CARCINOMA, GRADE 2, SPANNING 3.5 CM. - LOBULAR  CARCINOMA IN SITU. - INVASIVE CARCINOMA IS BROADLY 0.1 CM FROM POSTERIOR MARGIN. - PERINEURAL INVASION PRESENT. - SEE ONCOLOGY TABLE. 2. Lymph node, sentinel, biopsy, Left Axillary - ONE OF ONE LYMPH NODES NEGATIVE FOR CARCINOMA (0/1). 3. Lymph node, sentinel, biopsy, Left - ONE OF ONE LYMPH NODES NEGATIVE FOR CARCINOMA (0/1).   07/30/2017 Oncotype testing   Oncotype 07/30/17 Recurrence score is 23, an intermediate risk With a 10-year risk of recurrence at 15% with tamoxifen  alone.     11/11/2020 Imaging   MRI Lumbar Spine  IMPRESSION: Diffuse osseous metastases. Chronic inferior L2 endplate deformity with mild height loss.   Likely late subacute pathologic fracture deformities involving the superior L1 endplate and right L3 lamina.   Multilevel spondylosis. Moderate to severe spinal canal and neural foraminal narrowing at the L3-4 and L4-5 levels.   Mild to moderate spinal canal and neural foraminal narrowing at the L5-S1 level.   11/28/2020 PET scan   IMPRESSION: 1. Widespread and diffuse hypermetabolic bony metastases. 2. No evidence for soft tissue metastases in the neck, chest, abdomen, or pelvis. 3.  Aortic Atherosclerois (ICD10-170.0)     11/29/2020 -  Chemotherapy   Zometa  q58months starting 11/29/20. Held after first dose. Given her CKD will only give when she has hypercalcemia and may switch to Prolia.     12/04/2020 Imaging   MRI Brain  IMPRESSION: No evidence of intracranial metastatic disease.   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   Mild chronic microvascular ischemic changes. Small chronic right cerebellar infarct.     12/07/2020 - 12/21/2020 Radiation Therapy   palliative RT to left hip  with Dr Dewey on 12/07/20-12/21/20   12/09/2020 Pathology Results   FINAL MICROSCOPIC DIAGNOSIS:   A. BONE, BIOPSY:  - Metastatic carcinoma.  - See comment.   COMMENT:  The morphology is compatible with metastatic lobular breast carcinoma.  Estrogen  receptor, progesterone receptor and HER2/neu will be performed.   ADDENDUM:   PROGNOSTIC INDICATOR RESULTS:   Immunohistochemical and morphometric analysis performed manually   The tumor cells are EQUIVOCAL for Her2 (2+).  Her2 by FISH will be performed and the results will be reported  separately.   Estrogen Receptor:       POSITIVE, 50%, MODERATE-STRONG STAINING  Progesterone Receptor:   POSITIVE, 15%, MODERATE-STRONG STAINING    11/2020 -  Anti-estrogen oral therapy   First-line Anti-estrogen therapy Anastrozole  1mg  daily starting in 11/2020 and Ibrance  starting in 12/2020    01/09/2021 -  Chemotherapy   Ibrance  3 weeks on/1 weeks off starting beginning of 01/09/21. Reduced to 75mg  starting with C2 (02/13/21) due to cytopenia and fatigue. Reduced to 75mg  2 weeks on/1 week off starting C3 on 03/13/21)    03/31/2021 PET scan   IMPRESSION: 1. Dramatic reduction in metabolic activity throughout the skeleton. Metabolic activity of diffuse sclerotic skeletal lesions is now essentially at background blood pool level. No focal metabolic activity. 2. Persistent diffuse confluent sclerotic skeletal metastasis unchanged. 3. No evidence of soft tissue breast cancer metastasis.   10/13/2021 PET scan   IMPRESSION: 1. Widespread osseous metastatic disease again noted with areas of minimally increased metabolic activity within the sternal manubrium and L2 vertebral body. 2. No evidence of extra osseous metastatic disease.      Discussed the use of AI scribe software for clinical note transcription with the patient, who gave verbal consent to proceed.  History of Present Illness Shirley Wells is an 83 year old female with metastatic breast cancer who presents for follow-up.  She experiences persistent pain in the left ribcage area, radiating to the thoracic spine and left scapula. The pain is occasionally severe, requiring hydrocodone  twice daily and Tylenol  for management. Constipation is  managed with a stool softener.  She underwent radiation therapy in 2022 to the thoracic spine and left rib area.  She has a decreased appetite and has lost weight, dropping from 140 pounds to 132 pounds. She maintains her weight by consuming yogurt and protein snacks between meals.  No significant issues with her current medications, which include amlodipine  and Plavix . Her blood sugar is 107, managed through dietary choices. She has a history of kidney issues and is advised to maintain adequate hydration. Her hemoglobin is slightly low at 9.     All other systems were reviewed with the patient and are negative.  MEDICAL HISTORY:  Past Medical History:  Diagnosis Date   CHICKENPOX, HX OF 03/21/2010   Qualifier: Diagnosis of  By: Wilhemina RMA, Lucy     CVA (cerebral infarction) 02/2010   no residual deficits, a/w mid basal art stenosis, declined IC stent as rec by IR/neuro   Dyslipidemia    intol of statin due to >3x increase LFTs   GERD (gastroesophageal reflux disease)    hx   Hypertension, essential    Malignant neoplasm of upper-inner quadrant of left breast in female, estrogen receptor positive (HCC) 06/20/2017   Osteoarthritis    Stroke [facility])     SURGICAL HISTORY: Past Surgical History:  Procedure Laterality Date   CATARACT EXTRACTION W/PHACO Left 07/10/2024   Procedure: PHACOEMULSIFICATION, CATARACT, WITH IOL INSERTION;  Surgeon: Harrie,  Lynwood, MD;  Location: [facility];  Service: Ophthalmology;  Laterality: Left;  CDE: 7.13   CHOLECYSTECTOMY  1981   INSERTION, STENT, DRUG-ELUTING, LACRIMAL CANALICULUS Left 07/10/2024   Procedure: INSERTION, STENT, DRUG-ELUTING, LACRIMAL CANALICULUS;  Surgeon: Harrie Lynwood, MD;  Location: [facility];  Service: Ophthalmology;  Laterality: Left;   MASTECTOMY W/ SENTINEL NODE BIOPSY Left 07/30/2017   Procedure: LEFT TOTAL MASTECTOMY WITH AXILLARY SENTINEL LYMPH NODE BIOPSY;  Surgeon: Ebbie Cough, MD;  Location: [facility] OR;  Service: General;  Laterality:  Left;    I have reviewed the social history and family history with the patient and they are unchanged from previous note.  ALLERGIES:  is allergic to influenza vaccines, pneumococcal vaccines, simvastatin, tetanus toxoid-containing vaccines, and zoster vaccine live.  MEDICATIONS:  Current Outpatient Medications  Medication Sig Dispense Refill   HYDROcodone -acetaminophen  (NORCO) 10-325 MG tablet Take 1 tablet by mouth every 8 (eight) hours as needed. 60 tablet 0   acetaminophen  (TYLENOL ) 650 MG CR tablet Take 1,300 mg by mouth every 8 (eight) hours as needed for pain.     amLODipine  (NORVASC ) 2.5 MG tablet Take 1 tablet (2.5 mg total) by mouth daily. 90 tablet 3   aspirin  81 MG tablet Take 81 mg by mouth 2 (two) times a week.     clopidogrel  (PLAVIX ) 75 MG tablet TAKE 1 TABLET(75 MG) BY MOUTH DAILY 90 tablet 1   cyclopentolate (CYCLODRYL,CYCLOGYL) 1 % ophthalmic solution Place 1 drop into the left eye once.     elacestrant  hydrochloride (ORSERDU ) 345 MG tablet Take 1 tablet (345 mg total) by mouth daily. Take with food. 30 tablet 2   erythromycin ophthalmic ointment      hydrALAZINE  (APRESOLINE ) 50 MG tablet Take 1.5 tablets (75 mg total) by mouth 3 (three) times daily. 405 tablet 2   loratadine (CLARITIN) 10 MG tablet Take 10 mg by mouth daily as needed for allergies.     Menthol, Topical Analgesic, (BIOFREEZE EX) Apply 1 application topically daily as needed (muscle pain).     milk thistle 175 MG tablet Take 175 mg by mouth daily.     moxifloxacin  (VIGAMOX ) 0.5 % ophthalmic solution Place 1 drop into the left eye 3 (three) times daily.     Multiple Vitamin (MULTIVITAMIN) capsule Take 1 capsule by mouth daily.     palbociclib  (IBRANCE ) 75 MG tablet Take 1 tablet (75 mg total) by mouth daily. Take for 14 days on, 7 days off, repeat every 21 days. 14 tablet 2   prednisoLONE acetate (PRED FORTE) 1 % ophthalmic suspension SHAKE LIQUID AND INSTILL 1 DROP IN LEFT EYE TWICE DAILY FOR 2 WEEKS THEN  STOP     ZIRGAN 0.15 % GEL Place 1 drop into the left eye 5 (five) times daily.     No current facility-administered medications for this visit.    PHYSICAL EXAMINATION: ECOG PERFORMANCE STATUS: 2 - Symptomatic, <50% confined to bed  Vitals:   08/17/24 1000 08/17/24 1003  BP: (!) 142/68 (!) 146/62  Pulse: 83   Resp: 17   Temp: 98.3 F (36.8 C)   SpO2: 99%    Wt Readings from Last 3 Encounters:  08/17/24 132 lb 1.6 oz (59.9 kg)  07/10/24 137 lb 8 oz (62.4 kg)  07/09/24 137 lb 8 oz (62.4 kg)     GENERAL:alert, no distress and comfortable SKIN: skin color, texture, turgor are normal, no rashes or significant lesions EYES: normal, Conjunctiva are pink and non-injected, sclera clear NECK: supple, thyroid  normal size, non-tender,  without nodularity LYMPH:  no palpable lymphadenopathy in the cervical, axillary  LUNGS: clear to auscultation and percussion with normal breathing effort HEART: regular rate & rhythm and no murmurs and no lower extremity edema ABDOMEN:abdomen soft, non-tender and normal bowel sounds Musculoskeletal:no cyanosis of digits and no clubbing  NEURO: alert & oriented x 3 with fluent speech, no focal motor/sensory deficits  Physical Exam MEASUREMENTS: Weight- 132.  LABORATORY DATA:  I have reviewed the data as listed    Latest Ref Rng & Units 08/17/2024    9:24 AM 07/09/2024    8:11 AM 04/24/2024   10:04 AM  CBC  WBC 4.0 - 10.5 K/uL 4.0  2.7  6.1   Hemoglobin 12.0 - 15.0 g/dL 9.0  8.9  88.4   Hematocrit 36.0 - 46.0 % 26.8  26.5  33.8   Platelets 150 - 400 K/uL 188  160  219         Latest Ref Rng & Units 08/17/2024    9:24 AM 07/09/2024    8:11 AM 04/24/2024   10:04 AM  CMP  Glucose 70 - 99 mg/dL 892  897  899   BUN 8 - 23 mg/dL 31  35  38   Creatinine 0.44 - 1.00 mg/dL 8.08  7.97  8.22   Sodium 135 - 145 mmol/L 142  142  142   Potassium 3.5 - 5.1 mmol/L 4.1  4.1  3.7   Chloride 98 - 111 mmol/L 106  105  108   CO2 22 - 32 mmol/L 26  27  27     Calcium 8.9 - 10.3 mg/dL 89.6  9.8  89.7   Total Protein 6.5 - 8.1 g/dL 7.1  6.5  7.3   Total Bilirubin 0.0 - 1.2 mg/dL 0.7  0.4  0.5   Alkaline Phos 38 - 126 U/L 226  190  215   AST 15 - 41 U/L 80  33  57   ALT 0 - 44 U/L 22  15  56       RADIOGRAPHIC STUDIES: I have personally reviewed the radiological images as listed and agreed with the findings in the report. No results found.    No orders of the defined types were placed in this encounter.  All questions were answered. The patient knows to call the clinic with any problems, questions or concerns. No barriers to learning was detected. The total time spent in the appointment was 40 minutes, including review of chart and various tests results, discussions about plan of care and coordination of care plan     Onita Mattock, MD 08/17/2024

## 2024-08-17 NOTE — Assessment & Plan Note (Signed)
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 S/p left total mastectomy. She declined antiestrogen therapy due to concerns of side effects. She lost follow up after 07/2017.   -After 6 months of left hip and ribcage pain she was found to have diffuse bone metastasis on 11/28/20 PET. This was confirmed on 12/09/20 bone biopsy --she started first-line Anastrozole  in 11/2020 and Ibrance  on 01/09/21, not very compliant with Ibrance   -progressed on PET 10/03/2022 -changed her tx to fulvestrant  injection and Verzenio . 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 - I recommend PIK3CA inhibitor, she declined due to the potential side effects. -added Ibrance  to elacestrant  in June 2025 -PET on 07/31/2024 showed disease progression with new liver mets  -I recommend changing treatment to Fulvestrant  and capivasertib

## 2024-08-18 ENCOUNTER — Other Ambulatory Visit (HOSPITAL_COMMUNITY): Payer: Self-pay

## 2024-08-18 ENCOUNTER — Other Ambulatory Visit: Payer: Self-pay

## 2024-08-18 ENCOUNTER — Telehealth: Payer: Self-pay

## 2024-08-18 ENCOUNTER — Telehealth: Payer: Self-pay | Admitting: Pharmacist

## 2024-08-18 DIAGNOSIS — C7951 Secondary malignant neoplasm of bone: Secondary | ICD-10-CM

## 2024-08-18 DIAGNOSIS — C50212 Malignant neoplasm of upper-inner quadrant of left female breast: Secondary | ICD-10-CM

## 2024-08-18 DIAGNOSIS — E09621 Drug or chemical induced diabetes mellitus with foot ulcer: Secondary | ICD-10-CM

## 2024-08-18 LAB — CANCER ANTIGEN 27.29: CA 27.29: 793.8 U/mL — ABNORMAL HIGH (ref 0.0–38.6)

## 2024-08-18 NOTE — Telephone Encounter (Signed)
 Oral Oncology Patient Advocate Encounter  Prior Authorization for Truqap  has been approved.    PA# [id number] Effective dates: 08/18/24 through 11/25/24  Patients co-pay is $0.00.     Charlott Hamilton,  CPhT-Adv  she/her/hers [facility]  [facility] Special[facility] Pharm[facility] Services Pharmacy Technician Patient Advocate Specialist III [facility] Phone: [phone]  Fax: [phone] [email or web address]

## 2024-08-18 NOTE — Telephone Encounter (Signed)
 Oral Oncology Patient Advocate Encounter   Received notification that prior authorization for Truqap  is required.   PA submitted on 08/18/24 Key [id number] Status is pending      Charlott Hamilton,  CPhT-Adv  she/her/hers [facility]  [facility] Specialty Pharmacy Services Pharmacy Technician Patient Advocate Specialist III [facility] Phone: [phone]  Fax: [phone] [email or web address]

## 2024-08-18 NOTE — Telephone Encounter (Signed)
 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  CBC w/ Diff and CMP from 08/17/24 assessed, noted patient with baseline renal dysfunction (Scr 1.91 mg/dL, CrCl ~76.4 mL/min). No renal dose adjustments are recommended in the PI. Truqap  is primarily hepatically metabolized (renal metabolism ~21%). Patient starting on dose reduction per MD.   Patient does not have PMH signficiant for diabetes. She will need a baseline A1c prior to initiating therapy with Truqap  (will see if labs can be added on at her injection visit on 08/24/24). Blood glucose available from 08/17/24 is slightly above goal of <100 mg/dL (892 mg/dL). It is recommended for patient to self monitor and/or have FBG obtained day 3 or 4 of weeks 1, 2, 4, 6, 8, then monthly while on Truqap . A1c will need to be obtained every 3 months while on therapy.   Recommend patient start cetirizne 10 mg daily for rash PPX  Current medication list in Epic reviewed, no relevant/significant DDIs with Truqap  identified.  Evaluated chart and no patient barriers to medication adherence noted.   Patient agreement for treatment documented in MD note on 08/17/24.  [facility] and approval.  Oral Oncology Clinic will continue to follow for insurance authorization, copayment issues, initial counseling and start date.  Asberry Macintosh, PharmD, BCPS, BCOP Hematology/Oncology Clinical Pharmacist [phone] 08/18/2024 10:55 AM

## 2024-08-19 ENCOUNTER — Other Ambulatory Visit: Payer: Self-pay

## 2024-08-19 ENCOUNTER — Telehealth: Payer: Self-pay | Admitting: Hematology

## 2024-08-19 NOTE — Progress Notes (Signed)
 Specialty Pharmacy Initial Fill Coordination Note  Shirley Wells is a 84 y.o. female contacted today regarding refills of specialty medication(s) Capivasertib  (TRUQAP ) .  Patient requested Delivery  on 08/21/24  to verified address [address] [place] [address]   Medication will be filled on 09/252025.   Patient is aware of $0.00 copayment.

## 2024-08-19 NOTE — Telephone Encounter (Signed)
 Called pt and spoke with them regarding her upcoming appt and she aware.

## 2024-08-19 NOTE — Telephone Encounter (Signed)
 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  Treatment goal: Palliative  Counseled on administration, dosing, side effects, monitoring, drug-food interactions, safe handling, storage, and disposal.  Patient will take Truqap  200mg  tablets, 1 tablet (200 mg) by mouth 2 (two) times daily for 4 days, then off for 3 days. Repeat every 7 days.  Patient knows to avoid grapefruit and grapefruit juice while on Truqap .   Truqap  start date: 08/25/24  Side effects include but not limited to: hyperglycemia, rash/hypersensitivity reaction, diarrhea, fatigue, nausea, vomiting, mouth sores, decreased appetite. Diarrhea: Patient will obtain anti diarrheal and alert the office of 4 or more loose stools above baseline. Rash: Discussed possibility of severe cutaneous reactions with Truqap . Patient will alert the office of any dermatologic toxicity that may occur after Truqap  initiation. Recommend patient start cetirizine 10 mg daily with Truqap  to decrease risk of rash severity.  Hyperglycemia: Discussed with patient need for glucose control prior to Truqap  initiation, and possible need for anti-diabetic agents for the treatment of medication-induced hyperglycemia while on treatment with Truqap .   Nausea: Patient has anti-emetic on hand and knows to take it if nausea develops.    Patient states her daughter, Marval, is a Char[employer] fundraiser - I have called and LVM for Debbie to call back to specifically discuss hyperglycemia risk with new medication.  Reviewed with patient importance of keeping a medication schedule and plan for any missed doses. No barriers to medication adherence identified.   Medication reconciliation performed and medication/allergy  list updated.  Distress thermometer flowsheet: Distress thermometer not completed during telephone call as patient has been on previous lines of therapy.   Communication and Learning Assessment Primary learner: Patient Barriers to learning: No barriers Preferred language: English Learning preferences: Listening Reading  All questions answered.  Ms. Biancardi voiced understanding and appreciation.   Medication education handout placed in mail for patient. Patient knows to call the office with questions or concerns. Oral Chemotherapy Clinic phone number provided to patient.   Asberry Macintosh, PharmD, BCPS, BCOP Hematology/Oncology Clinical Pharmacist [phone] 08/19/2024 2:25 PM

## 2024-08-19 NOTE — Progress Notes (Signed)
 Oral Chemotherapy Pharmacist Encounter  Patient was counseled under telephone encounter from 08/18/24.  Asberry Macintosh, PharmD, BCPS, BCOP Hematology/Oncology Clinical Pharmacist [facility] Oral Chemotherapy Navigation Clinics [phone] 08/19/2024 2:32 PM

## 2024-08-24 ENCOUNTER — Inpatient Hospital Stay

## 2024-08-24 VITALS — BP 172/63 | HR 95 | Temp 98.3°F | Resp 16

## 2024-08-24 DIAGNOSIS — C50212 Malignant neoplasm of upper-inner quadrant of left female breast: Secondary | ICD-10-CM | POA: Diagnosis not present

## 2024-08-24 DIAGNOSIS — Z5111 Encounter for antineoplastic chemotherapy: Secondary | ICD-10-CM | POA: Diagnosis not present

## 2024-08-24 DIAGNOSIS — G893 Neoplasm related pain (acute) (chronic): Secondary | ICD-10-CM | POA: Diagnosis not present

## 2024-08-24 DIAGNOSIS — Z170 Estrogen receptor positive status [ER+]: Secondary | ICD-10-CM

## 2024-08-24 DIAGNOSIS — C787 Secondary malignant neoplasm of liver and intrahepatic bile duct: Secondary | ICD-10-CM | POA: Diagnosis not present

## 2024-08-24 DIAGNOSIS — Z1721 Progesterone receptor positive status: Secondary | ICD-10-CM | POA: Diagnosis not present

## 2024-08-24 DIAGNOSIS — D630 Anemia in neoplastic disease: Secondary | ICD-10-CM | POA: Diagnosis not present

## 2024-08-24 DIAGNOSIS — Z1732 Human epidermal growth factor receptor 2 negative status: Secondary | ICD-10-CM | POA: Diagnosis not present

## 2024-08-24 DIAGNOSIS — C7951 Secondary malignant neoplasm of bone: Secondary | ICD-10-CM | POA: Diagnosis not present

## 2024-08-24 LAB — CBC WITH DIFFERENTIAL/PLATELET
Abs Immature Granulocytes: 0.14 K/uL — ABNORMAL HIGH (ref 0.00–0.07)
Basophils Absolute: 0.1 K/uL (ref 0.0–0.1)
Basophils Relative: 1 %
Eosinophils Absolute: 0.1 K/uL (ref 0.0–0.5)
Eosinophils Relative: 1 %
HCT: 24.6 % — ABNORMAL LOW (ref 36.0–46.0)
Hemoglobin: 8 g/dL — ABNORMAL LOW (ref 12.0–15.0)
Immature Granulocytes: 3 %
Lymphocytes Relative: 20 %
Lymphs Abs: 0.9 K/uL (ref 0.7–4.0)
MCH: 40.4 pg — ABNORMAL HIGH (ref 26.0–34.0)
MCHC: 32.5 g/dL (ref 30.0–36.0)
MCV: 124.2 fL — ABNORMAL HIGH (ref 80.0–100.0)
Monocytes Absolute: 0.6 K/uL (ref 0.1–1.0)
Monocytes Relative: 12 %
Neutro Abs: 2.8 K/uL (ref 1.7–7.7)
Neutrophils Relative %: 63 %
Platelets: 179 K/uL (ref 150–400)
RBC: 1.98 MIL/uL — ABNORMAL LOW (ref 3.87–5.11)
RDW: 15.9 % — ABNORMAL HIGH (ref 11.5–15.5)
WBC: 4.5 K/uL (ref 4.0–10.5)
nRBC: 1.8 % — ABNORMAL HIGH (ref 0.0–0.2)

## 2024-08-24 LAB — COMPREHENSIVE METABOLIC PANEL WITH GFR
ALT: 22 U/L (ref 0–44)
AST: 121 U/L — ABNORMAL HIGH (ref 15–41)
Albumin: 3.6 g/dL (ref 3.5–5.0)
Alkaline Phosphatase: 225 U/L — ABNORMAL HIGH (ref 38–126)
Anion gap: 8 (ref 5–15)
BUN: 36 mg/dL — ABNORMAL HIGH (ref 8–23)
CO2: 27 mmol/L (ref 22–32)
Calcium: 10 mg/dL (ref 8.9–10.3)
Chloride: 108 mmol/L (ref 98–111)
Creatinine, Ser: 1.78 mg/dL — ABNORMAL HIGH (ref 0.44–1.00)
GFR, Estimated: 28 mL/min — ABNORMAL LOW (ref >60)
Glucose, Bld: 130 mg/dL — ABNORMAL HIGH (ref 70–99)
Potassium: 4.6 mmol/L (ref 3.5–5.1)
Sodium: 143 mmol/L (ref 135–145)
Total Bilirubin: 0.5 mg/dL (ref 0.0–1.2)
Total Protein: 6.8 g/dL (ref 6.5–8.1)

## 2024-08-24 MED ORDER — FULVESTRANT 250 MG/5ML IM SOSY
500.0000 mg | PREFILLED_SYRINGE | Freq: Once | INTRAMUSCULAR | Status: AC
  Administered 2024-08-24: 500 mg via INTRAMUSCULAR
  Filled 2024-08-24: qty 10

## 2024-08-25 LAB — CANCER ANTIGEN 27.29: CA 27.29: 824.5 U/mL — ABNORMAL HIGH (ref 0.0–38.6)

## 2024-08-25 NOTE — Telephone Encounter (Signed)
 Oral Oncology Pharmacist Encounter  Baseline A1c not drawn on 9/29. Per staff message with Dr. Lanny, OK for patient to start Truqap  and have A1c drawn at visit on 09/07/24.  Asberry Macintosh, PharmD, BCPS, BCOP Hematology/Oncology Clinical Pharmacist [phone] 08/25/2024 2:18 PM

## 2024-09-04 NOTE — Assessment & Plan Note (Signed)
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 S/p left total mastectomy. She declined antiestrogen therapy due to concerns of side effects. She lost follow up after 07/2017.   -After 6 months of left hip and ribcage pain she was found to have diffuse bone metastasis on 11/28/20 PET. This was confirmed on 12/09/20 bone biopsy --she started first-line Anastrozole  in 11/2020 and Ibrance  on 01/09/21, not very compliant with Ibrance   -progressed on PET 10/03/2022 -changed her tx to fulvestrant  injection and Verzenio . 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 - I recommend PIK3CA inhibitor, she declined due to the potential side effects. -added Ibrance  to elacestrant  in June 2025 -PET on 07/31/2024 showed disease progression with new liver mets  -I recommend changing treatment to Fulvestrant  and capivasertib , she started on 08/24/2024

## 2024-09-07 ENCOUNTER — Inpatient Hospital Stay: Attending: Hematology

## 2024-09-07 ENCOUNTER — Inpatient Hospital Stay: Admitting: Hematology

## 2024-09-07 ENCOUNTER — Ambulatory Visit

## 2024-09-07 ENCOUNTER — Ambulatory Visit: Admitting: Hematology

## 2024-09-07 ENCOUNTER — Other Ambulatory Visit: Payer: Self-pay

## 2024-09-07 ENCOUNTER — Inpatient Hospital Stay

## 2024-09-07 ENCOUNTER — Other Ambulatory Visit

## 2024-09-07 VITALS — BP 138/64 | HR 94 | Temp 97.8°F | Resp 17 | Wt 131.3 lb

## 2024-09-07 DIAGNOSIS — G893 Neoplasm related pain (acute) (chronic): Secondary | ICD-10-CM | POA: Diagnosis not present

## 2024-09-07 DIAGNOSIS — N1832 Chronic kidney disease, stage 3b: Secondary | ICD-10-CM | POA: Diagnosis not present

## 2024-09-07 DIAGNOSIS — D649 Anemia, unspecified: Secondary | ICD-10-CM | POA: Insufficient documentation

## 2024-09-07 DIAGNOSIS — C7951 Secondary malignant neoplasm of bone: Secondary | ICD-10-CM | POA: Insufficient documentation

## 2024-09-07 DIAGNOSIS — E1122 Type 2 diabetes mellitus with diabetic chronic kidney disease: Secondary | ICD-10-CM | POA: Diagnosis not present

## 2024-09-07 DIAGNOSIS — R634 Abnormal weight loss: Secondary | ICD-10-CM | POA: Diagnosis not present

## 2024-09-07 DIAGNOSIS — Z1721 Progesterone receptor positive status: Secondary | ICD-10-CM | POA: Diagnosis not present

## 2024-09-07 DIAGNOSIS — R530 Neoplastic (malignant) related fatigue: Secondary | ICD-10-CM | POA: Insufficient documentation

## 2024-09-07 DIAGNOSIS — Z170 Estrogen receptor positive status [ER+]: Secondary | ICD-10-CM

## 2024-09-07 DIAGNOSIS — B370 Candidal stomatitis: Secondary | ICD-10-CM | POA: Insufficient documentation

## 2024-09-07 DIAGNOSIS — I129 Hypertensive chronic kidney disease with stage 1 through stage 4 chronic kidney disease, or unspecified chronic kidney disease: Secondary | ICD-10-CM | POA: Diagnosis not present

## 2024-09-07 DIAGNOSIS — C787 Secondary malignant neoplasm of liver and intrahepatic bile duct: Secondary | ICD-10-CM | POA: Diagnosis not present

## 2024-09-07 DIAGNOSIS — Z1732 Human epidermal growth factor receptor 2 negative status: Secondary | ICD-10-CM | POA: Diagnosis not present

## 2024-09-07 DIAGNOSIS — R630 Anorexia: Secondary | ICD-10-CM | POA: Diagnosis not present

## 2024-09-07 DIAGNOSIS — Z5111 Encounter for antineoplastic chemotherapy: Secondary | ICD-10-CM | POA: Insufficient documentation

## 2024-09-07 DIAGNOSIS — C50212 Malignant neoplasm of upper-inner quadrant of left female breast: Secondary | ICD-10-CM | POA: Diagnosis not present

## 2024-09-07 DIAGNOSIS — L97509 Non-pressure chronic ulcer of other part of unspecified foot with unspecified severity: Secondary | ICD-10-CM

## 2024-09-07 LAB — CBC WITH DIFFERENTIAL/PLATELET
Abs Immature Granulocytes: 0.27 K/uL — ABNORMAL HIGH (ref 0.00–0.07)
Basophils Absolute: 0.1 K/uL (ref 0.0–0.1)
Basophils Relative: 2 %
Eosinophils Absolute: 0.2 K/uL (ref 0.0–0.5)
Eosinophils Relative: 4 %
HCT: 26.1 % — ABNORMAL LOW (ref 36.0–46.0)
Hemoglobin: 8.5 g/dL — ABNORMAL LOW (ref 12.0–15.0)
Immature Granulocytes: 6 %
Lymphocytes Relative: 18 %
Lymphs Abs: 0.9 K/uL (ref 0.7–4.0)
MCH: 39.9 pg — ABNORMAL HIGH (ref 26.0–34.0)
MCHC: 32.6 g/dL (ref 30.0–36.0)
MCV: 122.5 fL — ABNORMAL HIGH (ref 80.0–100.0)
Monocytes Absolute: 0.4 K/uL (ref 0.1–1.0)
Monocytes Relative: 8 %
Neutro Abs: 3 K/uL (ref 1.7–7.7)
Neutrophils Relative %: 62 %
Platelets: 258 K/uL (ref 150–400)
RBC: 2.13 MIL/uL — ABNORMAL LOW (ref 3.87–5.11)
RDW: 15 % (ref 11.5–15.5)
Smear Review: NORMAL
WBC: 4.9 K/uL (ref 4.0–10.5)
nRBC: 1.6 % — ABNORMAL HIGH (ref 0.0–0.2)

## 2024-09-07 LAB — COMPREHENSIVE METABOLIC PANEL WITH GFR
ALT: 13 U/L (ref 0–44)
AST: 23 U/L (ref 15–41)
Albumin: 3.6 g/dL (ref 3.5–5.0)
Alkaline Phosphatase: 177 U/L — ABNORMAL HIGH (ref 38–126)
Anion gap: 11 (ref 5–15)
BUN: 29 mg/dL — ABNORMAL HIGH (ref 8–23)
CO2: 26 mmol/L (ref 22–32)
Calcium: 9.8 mg/dL (ref 8.9–10.3)
Chloride: 105 mmol/L (ref 98–111)
Creatinine, Ser: 1.93 mg/dL — ABNORMAL HIGH (ref 0.44–1.00)
GFR, Estimated: 25 mL/min — ABNORMAL LOW (ref >60)
Glucose, Bld: 170 mg/dL — ABNORMAL HIGH (ref 70–99)
Potassium: 3.9 mmol/L (ref 3.5–5.1)
Sodium: 142 mmol/L (ref 135–145)
Total Bilirubin: 0.4 mg/dL (ref 0.0–1.2)
Total Protein: 6.8 g/dL (ref 6.5–8.1)

## 2024-09-07 LAB — HEMOGLOBIN A1C
Hgb A1c MFr Bld: 5.9 % — ABNORMAL HIGH (ref 4.8–5.6)
Mean Plasma Glucose: 122.63 mg/dL

## 2024-09-07 MED ORDER — FULVESTRANT 250 MG/5ML IM SOSY
500.0000 mg | PREFILLED_SYRINGE | Freq: Once | INTRAMUSCULAR | Status: AC
  Administered 2024-09-07: 500 mg via INTRAMUSCULAR
  Filled 2024-09-07: qty 10

## 2024-09-07 MED ORDER — CAPIVASERTIB 200 MG PO TBPK
200.0000 mg | ORAL_TABLET | Freq: Two times a day (BID) | ORAL | 1 refills | Status: DC
  Filled 2024-09-07: qty 64, 32d supply, fill #0
  Filled 2024-09-11: qty 32, 30d supply, fill #0
  Filled 2024-10-09: qty 32, 30d supply, fill #1

## 2024-09-07 NOTE — Progress Notes (Signed)
 [facility]   Telephone:([phone] [phone]   Clinic Follow up Note   Patient Care Team: Geofm Glade PARAS, MD as PCP - General (Internal Medicine) Rosemarie Eather RAMAN, MD as Consulting Physician (Neurology) Ebbie Cough, MD as Consulting Physician (General Surgery) Lanny Callander, MD as Consulting Physician (Hematology) Dewey Rush, MD as Consulting Physician (Radiation Oncology)  Date of Service:  09/07/2024  CHIEF COMPLAINT: f/u of metastatic breast cancer  CURRENT THERAPY:  Fulvestrant  and capivasertib   Oncology History   Malignant neoplasm of upper-inner quadrant of left breast in female, estrogen receptor positive (HCC) -pT2N0, Stage 1b, ER/PR+/HER2-, Grade 2, 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 S/p left total mastectomy. She declined antiestrogen therapy due to concerns of side effects. She lost follow up after 07/2017.   -After 6 months of left hip and ribcage pain she was found to have diffuse bone metastasis on 11/28/20 PET. This was confirmed on 12/09/20 bone biopsy --she started first-line Anastrozole  in 11/2020 and Ibrance  on 01/09/21, not very compliant with Ibrance   -progressed on PET 10/03/2022 -changed her tx to fulvestrant  injection and Verzenio . 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 - I recommend PIK3CA inhibitor, she declined due to the potential side effects. -added Ibrance  to elacestrant  in June 2025 -PET on 07/31/2024 showed disease progression with new liver mets  -I recommend changing treatment  to Fulvestrant  and capivasertib , she started on 08/24/2024  Assessment & Plan Metastatic breast cancer Currently on Fulvestrant  and capivasertib , experiencing significant fatigue and weakness, particularly during medication days. Effectiveness of treatment still under evaluation. - Adjust capivasertib  dosing to three days on and four days off to improve tolerance. - Administer second injection today. - Schedule follow-up in two weeks for the third injection and reassessment of treatment tolerance.  Cancer-related fatigue and weakness Reports significant fatigue and weakness, impacting daily activities, mostly resting in a recliner. - Adjust Trizivir dosing to three days on and four days off to potentially reduce fatigue and weakness.  Anemia Slightly anemic with hemoglobin level of 8.5 g/dL. - Encourage increased nutritional intake, especially liquid nutrition like Ensure, to help improve anemia.  Cancer-associated decreased appetite and weight loss Experienced weight loss of 5-6 pounds since August, current weight 131 pounds. Decreased appetite may contribute to weight loss. - Encourage increased nutritional intake, including liquid nutrition like Ensure, to address decreased appetite and weight loss.  Chronic pain related to malignancy Experiences chronic lower back pain, managed with hydrocodone  and Tylenol . Sufficient supply of pain medication, no refill needed currently. - Continue current pain management regimen with hydrocodone  and Tylenol  as needed. - Refill hydrocodone  prescription if needed in the future.  Plan - Due to significant fatigue, will reduce capivasertib  to 3 days on and 4 days off every week - Will proceed with fulvestrant  injection today and she will return for next injection in 2 weeks - Follow-up in 2 weeks    SUMMARY OF ONCOLOGIC HISTORY: Oncology History Overview Note   Cancer Staging  Malignant neoplasm of upper-inner quadrant of left breast in female,  estrogen receptor positive (HCC)  Staging form: Breast, AJCC 8th Edition - Clinical stage from 06/18/2017: Stage IB (cT2, cN0, cM0, G2, ER+, PR+, HER2-) - Signed by Lanny Callander, MD on 06/24/2017 Histologic grading system: 3 grade system - Pathologic stage from 07/30/2017: Stage IA (pT2, pN0, cM0, G2, ER+, PR+, HER2-, Oncotype DX score: 23) - Signed by Lanny Callander, MD on 08/23/2017 Neoadjuvant therapy: No Nuclear grade: G2 Multigene prognostic tests performed: Oncotype DX Recurrence score range: Greater than or equal to 11 Histologic grading system: 3 grade system Residual tumor (R): R0 - None Laterality: Left     Malignant neoplasm of upper-inner quadrant of left breast in female, estrogen receptor positive (HCC)  06/17/2017 Mammogram   US  and MM diagnostic Breast Tomo Bilateral  IMPRESSION: 1. 3.4 palpable mass in the 10 o'clock position of the left breast is highly suspicious for malignancy.  2. No evidence of malignancy in the right breast.  3. Ultrasound of the left axilla shows normal sized axillary lymph nodes.   06/18/2017 Initial Biopsy   Diagnosis 06/18/17 Breast, left, needle core biopsy, 10:00 o'clock - INVASIVE MAMMARY CARCINOMA. At least G2  The malignant cells are negative for E-Cadherin, supporting a lobular phenotype.   06/18/2017 Initial Diagnosis   Malignant neoplasm of upper-inner quadrant of left breast in female, estrogen receptor positive (HCC)   06/18/2017 Receptors her2   ER 95%+, PR 25%+, both strong staining  HER2- Ki67 30%    07/30/2017 Surgery   LEFT TOTAL MASTECTOMY WITH AXILLARY SENTINEL LYMPH NODE BIOPSY by Dr. Ebbie on 07/30/17   07/30/2017 Pathology Results   Diagnosis 07/30/17 1. Breast, simple mastectomy, Left Total - INVASIVE LOBULAR CARCINOMA, GRADE 2, SPANNING 3.5 CM. - LOBULAR CARCINOMA IN SITU. - INVASIVE CARCINOMA IS BROADLY 0.1 CM FROM POSTERIOR MARGIN. - PERINEURAL INVASION PRESENT. - SEE ONCOLOGY TABLE. 2. Lymph node, sentinel, biopsy, Left  Axillary - ONE OF ONE LYMPH NODES NEGATIVE FOR CARCINOMA (0/1). 3. Lymph node, sentinel, biopsy, Left - ONE OF ONE LYMPH NODES NEGATIVE FOR CARCINOMA (0/1).   07/30/2017 Oncotype testing   Oncotype 07/30/17 Recurrence score is 23, an intermediate risk With a 10-year risk of recurrence at 15% with tamoxifen  alone.     11/11/2020 Imaging   MRI Lumbar Spine  IMPRESSION: Diffuse osseous metastases. Chronic inferior L2 endplate deformity with mild height loss.   Likely late subacute pathologic fracture deformities involving the superior L1 endplate and right L3 lamina.   Multilevel spondylosis. Moderate to severe spinal canal and neural foraminal narrowing at the L3-4 and L4-5 levels.   Mild to moderate spinal canal and neural foraminal narrowing at the L5-S1 level.   11/28/2020 PET scan   IMPRESSION: 1. Widespread and diffuse hypermetabolic bony metastases. 2. No evidence for soft tissue metastases in the neck, chest, abdomen, or pelvis. 3.  Aortic Atherosclerois (ICD10-170.0)     11/29/2020 -  Chemotherapy   Zometa  q11months starting 11/29/20. Held after first dose. Given her CKD will only give when she has hypercalcemia and may switch to Prolia.     12/04/2020 Imaging   MRI Brain  IMPRESSION: No evidence of intracranial metastatic disease.   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   Mild chronic microvascular ischemic changes. Small chronic right cerebellar infarct.     12/07/2020 - 12/21/2020 Radiation Therapy   palliative RT to left hip with Dr Dewey on 12/07/20-12/21/20   12/09/2020 Pathology Results   FINAL MICROSCOPIC DIAGNOSIS:   A. BONE, BIOPSY:  - Metastatic carcinoma.  - See comment.  COMMENT:  The morphology is compatible with metastatic lobular breast carcinoma.  Estrogen receptor, progesterone receptor and HER2/neu will be performed.   ADDENDUM:   PROGNOSTIC INDICATOR RESULTS:   Immunohistochemical and morphometric analysis performed  manually   The tumor cells are EQUIVOCAL for Her2 (2+).  Her2 by FISH will be performed and the results will be reported  separately.   Estrogen Receptor:       POSITIVE, 50%, MODERATE-STRONG STAINING  Progesterone Receptor:   POSITIVE, 15%, MODERATE-STRONG STAINING    11/2020 -  Anti-estrogen oral therapy   First-line Anti-estrogen therapy Anastrozole  1mg  daily starting in 11/2020 and Ibrance  starting in 12/2020    01/09/2021 -  Chemotherapy   Ibrance  3 weeks on/1 weeks off starting beginning of 01/09/21. Reduced to 75mg  starting with C2 (02/13/21) due to cytopenia and fatigue. Reduced to 75mg  2 weeks on/1 week off starting C3 on 03/13/21)    03/31/2021 PET scan   IMPRESSION: 1. Dramatic reduction in metabolic activity throughout the skeleton. Metabolic activity of diffuse sclerotic skeletal lesions is now essentially at background blood pool level. No focal metabolic activity. 2. Persistent diffuse confluent sclerotic skeletal metastasis unchanged. 3. No evidence of soft tissue breast cancer metastasis.   10/13/2021 PET scan   IMPRESSION: 1. Widespread osseous metastatic disease again noted with areas of minimally increased metabolic activity within the sternal manubrium and L2 vertebral body. 2. No evidence of extra osseous metastatic disease.      Discussed the use of AI scribe software for clinical note transcription with the patient, who gave verbal consent to proceed.  History of Present Illness Shirley Wells is an 83 year old female with metastatic breast cancer who presents for follow-up.  She recently started a new oral medication regimen with Trizivir, taking it four days on and three days off. During the days she takes the medication, she experiences significant weakness and fatigue. Some improvement is noted during the off days. Her appetite remains poor, and she has lost approximately 5-6 pounds since August, with her current weight at 131 pounds.  Her significant  weakness affects her ability to perform daily activities, spending most of her time in a recliner and unable to prepare meals. She lives alone but receives daily assistance from family members. She has not experienced any falls but is cautious due to weakness and vision issues.  She has a hemoglobin level of 8.5. She takes liquid nutrition supplements like Ensure daily to help with her nutritional intake. No stomach issues from her current medication, although she has a history of a sensitive stomach and ulcers.     All other systems were reviewed with the patient and are negative.  MEDICAL HISTORY:  Past Medical History:  Diagnosis Date   CHICKENPOX, HX OF 03/21/2010   Qualifier: Diagnosis of  By: Wilhemina RMA, Lucy     CVA (cerebral infarction) 02/2010   no residual deficits, a/w mid basal art stenosis, declined IC stent as rec by IR/neuro   Dyslipidemia    intol of statin due to >3x increase LFTs   GERD (gastroesophageal reflux disease)    hx   Hypertension, essential    Malignant neoplasm of upper-inner quadrant of left breast in female, estrogen receptor positive (HCC) 06/20/2017   Osteoarthritis    Stroke [facility])     SURGICAL HISTORY: Past Surgical History:  Procedure Laterality Date   CATARACT EXTRACTION W/PHACO Left 07/10/2024   Procedure: PHACOEMULSIFICATION, CATARACT, WITH IOL INSERTION;  Surgeon: Harrie Agent, MD;  Location: [facility]  ORS;  Service: Ophthalmology;  Laterality: Left;  CDE: 7.13   CHOLECYSTECTOMY  1981   INSERTION, STENT, DRUG-ELUTING, LACRIMAL CANALICULUS Left 07/10/2024   Procedure: INSERTION, STENT, DRUG-ELUTING, LACRIMAL CANALICULUS;  Surgeon: Harrie Agent, MD;  Location: [facility] ORS;  Service: Ophthalmology;  Laterality: Left;   MASTECTOMY W/ SENTINEL NODE BIOPSY Left 07/30/2017   Procedure: LEFT TOTAL MASTECTOMY WITH AXILLARY SENTINEL LYMPH NODE BIOPSY;  Surgeon: Ebbie Cough, MD;  Location: [facility] OR;  Service: General;  Laterality: Left;    I have reviewed the  social history and family history with the patient and they are unchanged from previous note.  ALLERGIES:  is allergic to influenza vaccines, pneumococcal vaccines, simvastatin, tetanus toxoid-containing vaccines, and zoster vaccine live.  MEDICATIONS:  Current Outpatient Medications  Medication Sig Dispense Refill   acetaminophen  (TYLENOL ) 650 MG CR tablet Take 1,300 mg by mouth every 8 (eight) hours as needed for pain.     amLODipine  (NORVASC ) 2.5 MG tablet Take 1 tablet (2.5 mg total) by mouth daily. 90 tablet 3   aspirin  81 MG tablet Take 81 mg by mouth 2 (two) times a week.     cetirizine (ZYRTEC) 10 MG tablet Take 10 mg by mouth daily. For rash PPX while on Truqap      clopidogrel  (PLAVIX ) 75 MG tablet TAKE 1 TABLET(75 MG) BY MOUTH DAILY 90 tablet 1   cyclopentolate (CYCLODRYL,CYCLOGYL) 1 % ophthalmic solution Place 1 drop into the left eye once.     erythromycin ophthalmic ointment      hydrALAZINE  (APRESOLINE ) 50 MG tablet Take 1.5 tablets (75 mg total) by mouth 3 (three) times daily. 405 tablet 2   HYDROcodone -acetaminophen  (NORCO) 10-325 MG tablet Take 1 tablet by mouth every 8 (eight) hours as needed. 60 tablet 0   loratadine (CLARITIN) 10 MG tablet Take 10 mg by mouth daily as needed for allergies.     Menthol, Topical Analgesic, (BIOFREEZE EX) Apply 1 application topically daily as needed (muscle pain).     milk thistle 175 MG tablet Take 175 mg by mouth daily.     moxifloxacin  (VIGAMOX ) 0.5 % ophthalmic solution Place 1 drop into the left eye 3 (three) times daily.     Multiple Vitamin (MULTIVITAMIN) capsule Take 1 capsule by mouth daily.     prednisoLONE acetate (PRED FORTE) 1 % ophthalmic suspension SHAKE LIQUID AND INSTILL 1 DROP IN LEFT EYE TWICE DAILY FOR 2 WEEKS THEN STOP     ZIRGAN 0.15 % GEL Place 1 drop into the left eye 5 (five) times daily.     capivasertib  (TRUQAP ) 200 MG pack Take 1 tablet (200 mg total) by mouth 2 (two) times daily. Take for 3 days, then hold for 4  days. Repeat every 7 days. 64 tablet 1   No current facility-administered medications for this visit.    PHYSICAL EXAMINATION: ECOG PERFORMANCE STATUS: 2 - Symptomatic, <50% confined to bed  Vitals:   09/07/24 0958  BP: 138/64  Pulse: 94  Resp: 17  Temp: 97.8 F (36.6 C)  SpO2: 94%   Wt Readings from Last 3 Encounters:  09/07/24 131 lb 4.8 oz (59.6 kg)  08/17/24 132 lb 1.6 oz (59.9 kg)  07/10/24 137 lb 8 oz (62.4 kg)     GENERAL:alert, no distress and comfortable SKIN: skin color, texture, turgor are normal, no rashes or significant lesions EYES: normal, Conjunctiva are pink and non-injected, sclera clear NECK: supple, thyroid  normal size, non-tender, without nodularity LYMPH:  no palpable lymphadenopathy in the cervical, axillary  LUNGS: clear to auscultation and percussion with normal breathing effort HEART: regular rate & rhythm and no murmurs and no lower extremity edema ABDOMEN:abdomen soft, non-tender and normal bowel sounds Musculoskeletal:no cyanosis of digits and no clubbing  NEURO: alert & oriented x 3 with fluent speech, no focal motor/sensory deficits  Physical Exam MEASUREMENTS: Weight- 131 lbs.  LABORATORY DATA:  I have reviewed the data as listed    Latest Ref Rng & Units 09/07/2024    9:34 AM 08/24/2024   10:22 AM 08/17/2024    9:24 AM  CBC  WBC 4.0 - 10.5 K/uL 4.9  4.5  4.0   Hemoglobin 12.0 - 15.0 g/dL 8.5  8.0  9.0   Hematocrit 36.0 - 46.0 % 26.1  24.6  26.8   Platelets 150 - 400 K/uL 258  179  188         Latest Ref Rng & Units 09/07/2024    9:34 AM 08/24/2024   10:22 AM 08/17/2024    9:24 AM  CMP  Glucose 70 - 99 mg/dL 829  869  892   BUN 8 - 23 mg/dL 29  36  31   Creatinine 0.44 - 1.00 mg/dL 8.06  8.21  8.08   Sodium 135 - 145 mmol/L 142  143  142   Potassium 3.5 - 5.1 mmol/L 3.9  4.6  4.1   Chloride 98 - 111 mmol/L 105  108  106   CO2 22 - 32 mmol/L 26  27  26    Calcium 8.9 - 10.3 mg/dL 9.8  89.9  89.6   Total Protein 6.5 - 8.1 g/dL  6.8  6.8  7.1   Total Bilirubin 0.0 - 1.2 mg/dL 0.4  0.5  0.7   Alkaline Phos 38 - 126 U/L 177  225  226   AST 15 - 41 U/L 23  121  80   ALT 0 - 44 U/L 13  22  22        RADIOGRAPHIC STUDIES: I have personally reviewed the radiological images as listed and agreed with the findings in the report. No results found.    No orders of the defined types were placed in this encounter.  All questions were answered. The patient knows to call the clinic with any problems, questions or concerns. No barriers to learning was detected. The total time spent in the appointment was 30 minutes, including review of chart and various tests results, discussions about plan of care and coordination of care plan     Onita Mattock, MD 09/07/2024

## 2024-09-08 ENCOUNTER — Other Ambulatory Visit: Payer: Self-pay

## 2024-09-08 LAB — CANCER ANTIGEN 27.29: CA 27.29: 754.6 U/mL — ABNORMAL HIGH (ref 0.0–38.6)

## 2024-09-11 ENCOUNTER — Other Ambulatory Visit: Payer: Self-pay

## 2024-09-11 NOTE — Progress Notes (Signed)
 Specialty Pharmacy Ongoing Clinical Assessment Note  Shirley Wells is a 83 y.o. female who is being followed by the specialty pharmacy service for RxSp Oncology   Patient's specialty medication(s) reviewed today: Capivasertib  (TRUQAP )   Missed doses in the last 4 weeks: 0   Patient/Caregiver did not have any additional questions or concerns.   Therapeutic benefit summary: Unable to assess   Adverse events/side effects summary: Experienced adverse events/side effects (still experiencing dizziness and loss of appetitie, plans to discuss next week at appt)   Patient's therapy is appropriate to: Continue    Goals Addressed             This Visit's Progress    Maintain optimal adherence to therapy   On track    Patient is on track. Patient will maintain adherence and be monitored by provider to determine if a change in treatment plan is warranted      Slow Disease Progression       Patient is unable to be assessed as therapy was recently initiated. Patient will maintain adherence         Follow up: 3 months  [facility] Specialty Pharmacist

## 2024-09-11 NOTE — Progress Notes (Signed)
 Specialty Pharmacy Refill Coordination Note  Shirley Wells is a 83 y.o. female contacted today regarding refills of specialty medication(s) Capivasertib  (TRUQAP )   Patient requested Delivery   Delivery date: 09/15/24   Verified address: [address] [place] [address]   Medication will be filled on 09/14/24.

## 2024-09-14 ENCOUNTER — Other Ambulatory Visit: Payer: Self-pay

## 2024-09-20 NOTE — Patient Instructions (Addendum)
        Medications changes include :   None    A referral was ordered and someone will call you to schedule an appointment.     Return in about 6 months (around 03/22/2025) for follow up.

## 2024-09-20 NOTE — Assessment & Plan Note (Signed)
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 S/p left total mastectomy. She declined antiestrogen therapy due to concerns of side effects. She lost follow up after 07/2017.   -After 6 months of left hip and ribcage pain she was found to have diffuse bone metastasis on 11/28/20 PET. This was confirmed on 12/09/20 bone biopsy --she started first-line Anastrozole  in 11/2020 and Ibrance  on 01/09/21, not very compliant with Ibrance   -progressed on PET 10/03/2022 -changed her tx to fulvestrant  injection and Verzenio . 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 - I recommend PIK3CA inhibitor, she declined due to the potential side effects. -added Ibrance  to elacestrant  in June 2025 -PET on 07/31/2024 showed disease progression with new liver mets  -I recommend changing treatment to Fulvestrant  and capivasertib , she started on 08/24/2024, and required dose reduction due to fatigue

## 2024-09-20 NOTE — Progress Notes (Unsigned)
 Subjective:    Patient ID: Shirley Wells, female    DOB: 03/07/1941, 83 y.o.   MRN: [id number]     HPI [place] is here for follow up of her chronic medical problems.  Still on chemo for metastatic breast ca --- dose just reduced secondary to significant fatigue.  Medications and allergies reviewed with patient and updated if appropriate.  Current Outpatient Medications on File Prior to Visit  Medication Sig Dispense Refill   acetaminophen  (TYLENOL ) 650 MG CR tablet Take 1,300 mg by mouth every 8 (eight) hours as needed for pain.     amLODipine  (NORVASC ) 2.5 MG tablet Take 1 tablet (2.5 mg total) by mouth daily. 90 tablet 3   aspirin  81 MG tablet Take 81 mg by mouth 2 (two) times a week.     capivasertib  (TRUQAP ) 200 MG pack Take 1 tablet (200 mg total) by mouth 2 (two) times daily. Take for 3 days, then hold for 4 days. Repeat every 7 days. 64 tablet 1   cetirizine (ZYRTEC) 10 MG tablet Take 10 mg by mouth daily. For rash PPX while on Truqap      clopidogrel  (PLAVIX ) 75 MG tablet TAKE 1 TABLET(75 MG) BY MOUTH DAILY 90 tablet 1   cyclopentolate (CYCLODRYL,CYCLOGYL) 1 % ophthalmic solution Place 1 drop into the left eye once.     erythromycin ophthalmic ointment      hydrALAZINE  (APRESOLINE ) 50 MG tablet Take 1.5 tablets (75 mg total) by mouth 3 (three) times daily. 405 tablet 2   HYDROcodone -acetaminophen  (NORCO) 10-325 MG tablet Take 1 tablet by mouth every 8 (eight) hours as needed. 60 tablet 0   loratadine (CLARITIN) 10 MG tablet Take 10 mg by mouth daily as needed for allergies.     Menthol, Topical Analgesic, (BIOFREEZE EX) Apply 1 application topically daily as needed (muscle pain).     milk thistle 175 MG tablet Take 175 mg by mouth daily.     moxifloxacin  (VIGAMOX ) 0.5 % ophthalmic solution Place 1 drop into the left eye 3 (three) times daily.     Multiple Vitamin (MULTIVITAMIN) capsule Take 1 capsule by mouth daily.     prednisoLONE acetate (PRED FORTE) 1 % ophthalmic  suspension SHAKE LIQUID AND INSTILL 1 DROP IN LEFT EYE TWICE DAILY FOR 2 WEEKS THEN STOP     ZIRGAN 0.15 % GEL Place 1 drop into the left eye 5 (five) times daily.     No current facility-administered medications on file prior to visit.     Review of Systems     Objective:  There were no vitals filed for this visit. BP Readings from Last 3 Encounters:  09/07/24 138/64  08/24/24 (!) 172/63  08/17/24 (!) 146/62   Wt Readings from Last 3 Encounters:  09/07/24 131 lb 4.8 oz (59.6 kg)  08/17/24 132 lb 1.6 oz (59.9 kg)  07/10/24 137 lb 8 oz (62.4 kg)   There is no height or weight on file to calculate BMI.    Physical Exam     Lab Results  Component Value Date   WBC 4.9 09/07/2024   HGB 8.5 (L) 09/07/2024   HCT 26.1 (L) 09/07/2024   PLT 258 09/07/2024   GLUCOSE 170 (H) 09/07/2024   CHOL 241 (H) 07/09/2024   TRIG 149 07/09/2024   HDL 74 07/09/2024   LDLDIRECT 58.0 09/12/2020   LDLCALC 137 (H) 07/09/2024   ALT 13 09/07/2024   AST 23 09/07/2024   NA 142 09/07/2024  K 3.9 09/07/2024   CL 105 09/07/2024   CREATININE 1.93 (H) 09/07/2024   BUN 29 (H) 09/07/2024   CO2 26 09/07/2024   TSH 1.67 11/02/2016   INR 1.1 12/09/2020   HGBA1C 5.9 (H) 09/07/2024     Assessment & Plan:    See Problem List for Assessment and Plan of chronic medical problems.

## 2024-09-21 ENCOUNTER — Inpatient Hospital Stay: Admitting: Hematology

## 2024-09-21 ENCOUNTER — Inpatient Hospital Stay (HOSPITAL_BASED_OUTPATIENT_CLINIC_OR_DEPARTMENT_OTHER)

## 2024-09-21 ENCOUNTER — Encounter: Payer: Self-pay | Admitting: Internal Medicine

## 2024-09-21 ENCOUNTER — Ambulatory Visit: Admitting: Internal Medicine

## 2024-09-21 ENCOUNTER — Inpatient Hospital Stay

## 2024-09-21 VITALS — BP 134/62 | HR 98 | Temp 98.3°F | Ht 64.5 in | Wt 127.0 lb

## 2024-09-21 VITALS — BP 138/60 | HR 98 | Temp 98.3°F | Resp 97 | Ht 64.5 in | Wt 127.7 lb

## 2024-09-21 DIAGNOSIS — R7303 Prediabetes: Secondary | ICD-10-CM | POA: Diagnosis not present

## 2024-09-21 DIAGNOSIS — Z170 Estrogen receptor positive status [ER+]: Secondary | ICD-10-CM

## 2024-09-21 DIAGNOSIS — Z5111 Encounter for antineoplastic chemotherapy: Secondary | ICD-10-CM | POA: Diagnosis not present

## 2024-09-21 DIAGNOSIS — C787 Secondary malignant neoplasm of liver and intrahepatic bile duct: Secondary | ICD-10-CM | POA: Diagnosis not present

## 2024-09-21 DIAGNOSIS — N184 Chronic kidney disease, stage 4 (severe): Secondary | ICD-10-CM

## 2024-09-21 DIAGNOSIS — Z8673 Personal history of transient ischemic attack (TIA), and cerebral infarction without residual deficits: Secondary | ICD-10-CM

## 2024-09-21 DIAGNOSIS — C7981 Secondary malignant neoplasm of breast: Secondary | ICD-10-CM

## 2024-09-21 DIAGNOSIS — C50212 Malignant neoplasm of upper-inner quadrant of left female breast: Secondary | ICD-10-CM | POA: Diagnosis not present

## 2024-09-21 DIAGNOSIS — I10 Essential (primary) hypertension: Secondary | ICD-10-CM | POA: Diagnosis not present

## 2024-09-21 LAB — COMPREHENSIVE METABOLIC PANEL WITH GFR
ALT: 23 U/L (ref 0–44)
AST: 36 U/L (ref 15–41)
Albumin: 3.5 g/dL (ref 3.5–5.0)
Alkaline Phosphatase: 318 U/L — ABNORMAL HIGH (ref 38–126)
Anion gap: 8 (ref 5–15)
BUN: 25 mg/dL — ABNORMAL HIGH (ref 8–23)
CO2: 27 mmol/L (ref 22–32)
Calcium: 9.5 mg/dL (ref 8.9–10.3)
Chloride: 107 mmol/L (ref 98–111)
Creatinine, Ser: 1.9 mg/dL — ABNORMAL HIGH (ref 0.44–1.00)
GFR, Estimated: 26 mL/min — ABNORMAL LOW (ref >60)
Glucose, Bld: 120 mg/dL — ABNORMAL HIGH (ref 70–99)
Potassium: 4.4 mmol/L (ref 3.5–5.1)
Sodium: 142 mmol/L (ref 135–145)
Total Bilirubin: 0.4 mg/dL (ref 0.0–1.2)
Total Protein: 6.7 g/dL (ref 6.5–8.1)

## 2024-09-21 LAB — CBC WITH DIFFERENTIAL/PLATELET
Abs Immature Granulocytes: 0.09 K/uL — ABNORMAL HIGH (ref 0.00–0.07)
Basophils Absolute: 0.1 K/uL (ref 0.0–0.1)
Basophils Relative: 2 %
Eosinophils Absolute: 0.3 K/uL (ref 0.0–0.5)
Eosinophils Relative: 8 %
HCT: 26.4 % — ABNORMAL LOW (ref 36.0–46.0)
Hemoglobin: 8.9 g/dL — ABNORMAL LOW (ref 12.0–15.0)
Immature Granulocytes: 2 %
Lymphocytes Relative: 18 %
Lymphs Abs: 0.8 K/uL (ref 0.7–4.0)
MCH: 39 pg — ABNORMAL HIGH (ref 26.0–34.0)
MCHC: 33.7 g/dL (ref 30.0–36.0)
MCV: 115.8 fL — ABNORMAL HIGH (ref 80.0–100.0)
Monocytes Absolute: 0.3 K/uL (ref 0.1–1.0)
Monocytes Relative: 8 %
Neutro Abs: 2.8 K/uL (ref 1.7–7.7)
Neutrophils Relative %: 62 %
Platelets: 221 K/uL (ref 150–400)
RBC: 2.28 MIL/uL — ABNORMAL LOW (ref 3.87–5.11)
RDW: 14.5 % (ref 11.5–15.5)
WBC: 4.4 K/uL (ref 4.0–10.5)
nRBC: 0.7 % — ABNORMAL HIGH (ref 0.0–0.2)

## 2024-09-21 MED ORDER — FULVESTRANT 250 MG/5ML IM SOSY
500.0000 mg | PREFILLED_SYRINGE | Freq: Once | INTRAMUSCULAR | Status: AC
  Administered 2024-09-21: 500 mg via INTRAMUSCULAR
  Filled 2024-09-21: qty 10

## 2024-09-21 MED ORDER — MEGESTROL ACETATE 40 MG PO TABS: 120.0000 mg | ORAL_TABLET | Freq: Three times a day (TID) | ORAL | 0 refills | Status: AC

## 2024-09-21 MED ORDER — HYDRALAZINE HCL 50 MG PO TABS: 75.0000 mg | ORAL_TABLET | Freq: Two times a day (BID) | ORAL | Status: AC

## 2024-09-21 MED ORDER — HYDROCODONE-ACETAMINOPHEN 10-325 MG PO TABS: 1.0000 | ORAL_TABLET | Freq: Three times a day (TID) | ORAL | 0 refills | Status: DC | PRN

## 2024-09-21 NOTE — Assessment & Plan Note (Signed)
 Chronic Lab Results  Component Value Date   HGBA1C 5.9 (H) 09/07/2024   Stable - low risk of progression to diabetes

## 2024-09-21 NOTE — Assessment & Plan Note (Addendum)
 Chronic H/o cva Continue plavix  75 mg daily BP controlled

## 2024-09-21 NOTE — Assessment & Plan Note (Addendum)
 Chronic Stage 4 Progression over the years, but stable recently BP controlled

## 2024-09-21 NOTE — Assessment & Plan Note (Addendum)
 Chronic Blood pressure controlled Not having any chest pain, palpitations, headaches or dizziness/lightheadedness Continue amlodipine  2.5 mg daily, hydralazine  to 75 mg 2 times daily Monitor BP at home-May need to further decrease medication depending on her weight and overall health status

## 2024-09-21 NOTE — Progress Notes (Signed)
 Shirley Wells   [phone] [phone]   Clinic Follow up Note   Patient Care Team: Shirley Glade PARAS, MD as PCP - General (Internal Medicine) Shirley Eather RAMAN, MD as Consulting Physician (Neurology) Shirley Cough, MD as Consulting Physician (General Surgery) Shirley Callander, MD as Consulting Physician (Hematology) Shirley Rush, MD as Consulting Physician (Radiation Oncology)  Date of Service:  09/21/2024  CHIEF COMPLAINT: f/u of metastatic breast cancer  CURRENT THERAPY:  Capivasertib  200mg  bid, 3 days on and 4 days off Fulvestrant  injection every 4 weeks  Oncology History   Malignant neoplasm of upper-inner quadrant of left breast in female, estrogen receptor positive (HCC) -pT2N0, Stage 1b, ER/PR+/HER2-, Grade 2, 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 S/p left total mastectomy. She declined antiestrogen therapy due to concerns of side effects. She lost follow up after 07/2017.   -After 6 months of left hip and ribcage pain she was found to have diffuse bone metastasis on 11/28/20 PET. This was confirmed on 12/09/20 bone biopsy --she started first-line Anastrozole  in 11/2020 and Ibrance  on 01/09/21, not very compliant with Ibrance   -progressed on PET 10/03/2022 -changed her tx to fulvestrant  injection and Verzenio . 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 - I recommend PIK3CA inhibitor, she declined due to the potential side effects. -added Ibrance  to elacestrant  in June 2025 -PET on 07/31/2024 showed disease  progression with new liver mets  -I recommend changing treatment to Fulvestrant  and capivasertib , she started on 08/24/2024, and required dose reduction due to fatigue   Assessment & Plan Metastatic breast cancer, left upper-inner quadrant Undergoing systemic therapy with a reduced dose schedule of Capivasertib . Reports slight improvement in strength and stability, but significant fatigue and anorexia persist. Current treatment plan under evaluation with pending tumor marker results. Considering hospice care transition if treatment proves ineffective or quality of life declines. - Order PET scan in approximately one month, post-Thanksgiving, to assess treatment efficacy. - Continue current systemic therapy for another month and reassess. - Discuss potential transition to hospice care if treatment is ineffective or quality of life declines.  Cancer-related fatigue Experiences significant fatigue, likely multifactorial, related to cancer and systemic therapy. Reports lack of energy and difficulty with daily activities. - Encourage high-protein, high-calorie diet to improve energy levels. - Advise on maintaining physical activity to prevent muscle loss.  Cancer-related anorexia and weight loss Decreased appetite and weight loss of 4-5 pounds since last visit. Prefers pills over liquid supplements. - Prescribe oral Megestrol acetate to stimulate appetite, with caution about small risk of blood clots. - Advise on dietary intake of high-protein foods and liquid nutrition as tolerated. - Advise to stay active to prevent blood clots and elevate legs when sitting.  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 - Refill hydrocodone  prescription for pain management.  Oral candidiasis (thrush) Symptoms include white film on tongue and sandpaper-like sensation, possibly related to elevated blood sugar or immunosuppression. - Prescribe nystatin mouthwash for  symptomatic relief.  Chronic kidney disease, stage IIIB Kidney function remains stable with no  significant changes since last evaluation.  Hypertension Blood pressure well-managed with hydrochlorothiazide  and amlodipine .  Type 2 diabetes mellitus without complications Blood sugar level at 120 today, within normal range. Previous higher levels may contribute to oral candidiasis.    Goal of care discussion  -We again discussed the incurable nature of her cancer, and the overall poor prognosis, especially if she does not have good response to chemotherapy or progress on chemo -The patient understands the goal of care is palliative. - We discussed home hospice service, if she came off cancer treatment due to fatigue or not responding well, she is open to that.    Plan - She remains very fatigued, only able to do self-care, pain is controlled. - She would like to capivasertib  for now, but she understands if her fatigue gets worse she can stop and transition to hospice. - Will proceed fulvestrant  injection today and continue every 4 weeks - Will schedule her follow-up in 6 weeks with lab, restaging PET scan 1 week before.  Again if she is ready for hospice, I will cancel her follow-up in the PET scan.   SUMMARY OF ONCOLOGIC HISTORY: Oncology History Overview Note   Cancer Staging  Malignant neoplasm of upper-inner quadrant of left breast in female, estrogen receptor positive (HCC) Staging form: Breast, AJCC 8th Edition - Clinical stage from 06/18/2017: Stage IB (cT2, cN0, cM0, G2, ER+, PR+, HER2-) - Signed by Shirley Callander, MD on 06/24/2017 Histologic grading system: 3 grade system - Pathologic stage from 07/30/2017: Stage IA (pT2, pN0, cM0, G2, ER+, PR+, HER2-, Oncotype DX score: 23) - Signed by Shirley Callander, MD on 08/23/2017 Neoadjuvant therapy: No Nuclear grade: G2 Multigene prognostic tests performed: Oncotype DX Recurrence score range: Greater than or equal to 11 Histologic grading system: 3  grade system Residual tumor (R): R0 - None Laterality: Left     Malignant neoplasm of upper-inner quadrant of left breast in female, estrogen receptor positive (HCC)  06/17/2017 Mammogram   US  and MM diagnostic Breast Tomo Bilateral  IMPRESSION: 1. 3.4 palpable mass in the 10 o'clock position of the left breast is highly suspicious for malignancy.  2. No evidence of malignancy in the right breast.  3. Ultrasound of the left axilla shows normal sized axillary lymph nodes.   06/18/2017 Initial Biopsy   Diagnosis 06/18/17 Breast, left, needle core biopsy, 10:00 o'clock - INVASIVE MAMMARY CARCINOMA. At least G2  The malignant cells are negative for E-Cadherin, supporting a lobular phenotype.   06/18/2017 Initial Diagnosis   Malignant neoplasm of upper-inner quadrant of left breast in female, estrogen receptor positive (HCC)   06/18/2017 Receptors her2   ER 95%+, PR 25%+, both strong staining  HER2- Ki67 30%    07/30/2017 Surgery   LEFT TOTAL MASTECTOMY WITH AXILLARY SENTINEL LYMPH NODE BIOPSY by Dr. Ebbie on 07/30/17   07/30/2017 Pathology Results   Diagnosis 07/30/17 1. Breast, simple mastectomy, Left Total - INVASIVE LOBULAR CARCINOMA, GRADE 2, SPANNING 3.5 CM. - LOBULAR CARCINOMA IN SITU. - INVASIVE CARCINOMA IS BROADLY 0.1 CM FROM POSTERIOR MARGIN. - PERINEURAL INVASION PRESENT. - SEE ONCOLOGY TABLE. 2. Lymph node, sentinel, biopsy, Left Axillary - ONE OF ONE LYMPH NODES NEGATIVE FOR CARCINOMA (0/1). 3. Lymph node, sentinel, biopsy, Left - ONE OF ONE LYMPH NODES NEGATIVE FOR CARCINOMA (0/1).   07/30/2017 Oncotype testing   Oncotype 07/30/17 Recurrence score is 23, an intermediate risk With a 10-year risk of recurrence at 15% with tamoxifen  alone.     11/11/2020 Imaging   MRI  Lumbar Spine  IMPRESSION: Diffuse osseous metastases. Chronic inferior L2 endplate deformity with mild height loss.   Likely late subacute pathologic fracture deformities involving the superior L1  endplate and right L3 lamina.   Multilevel spondylosis. Moderate to severe spinal canal and neural foraminal narrowing at the L3-4 and L4-5 levels.   Mild to moderate spinal canal and neural foraminal narrowing at the L5-S1 level.   11/28/2020 PET scan   IMPRESSION: 1. Widespread and diffuse hypermetabolic bony metastases. 2. No evidence for soft tissue metastases in the neck, chest, abdomen, or pelvis. 3.  Aortic Atherosclerois (ICD10-170.0)     11/29/2020 -  Chemotherapy   Zometa  q72months starting 11/29/20. Held after first dose. Given her CKD will only give when she has hypercalcemia and may switch to Prolia.     12/04/2020 Imaging   MRI Brain  IMPRESSION: No evidence of intracranial metastatic disease.   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   Mild chronic microvascular ischemic changes. Small chronic right cerebellar infarct.     12/07/2020 - 12/21/2020 Radiation Therapy   palliative RT to left hip with Dr Shirley on 12/07/20-12/21/20   12/09/2020 Pathology Results   FINAL MICROSCOPIC DIAGNOSIS:   A. BONE, BIOPSY:  - Metastatic carcinoma.  - See comment.   COMMENT:  The morphology is compatible with metastatic lobular breast carcinoma.  Estrogen receptor, progesterone receptor and HER2/neu will be performed.   ADDENDUM:   PROGNOSTIC INDICATOR RESULTS:   Immunohistochemical and morphometric analysis performed manually   The tumor cells are EQUIVOCAL for Her2 (2+).  Her2 by FISH will be performed and the results will be reported  separately.   Estrogen Receptor:       POSITIVE, 50%, MODERATE-STRONG STAINING  Progesterone Receptor:   POSITIVE, 15%, MODERATE-STRONG STAINING    11/2020 -  Anti-estrogen oral therapy   First-line Anti-estrogen therapy Anastrozole  1mg  daily starting in 11/2020 and Ibrance  starting in 12/2020    01/09/2021 -  Chemotherapy   Ibrance  3 weeks on/1 weeks off starting beginning of 01/09/21. Reduced to 75mg  starting with C2  (02/13/21) due to cytopenia and fatigue. Reduced to 75mg  2 weeks on/1 week off starting C3 on 03/13/21)    03/31/2021 PET scan   IMPRESSION: 1. Dramatic reduction in metabolic activity throughout the skeleton. Metabolic activity of diffuse sclerotic skeletal lesions is now essentially at background blood pool level. No focal metabolic activity. 2. Persistent diffuse confluent sclerotic skeletal metastasis unchanged. 3. No evidence of soft tissue breast cancer metastasis.   10/13/2021 PET scan   IMPRESSION: 1. Widespread osseous metastatic disease again noted with areas of minimally increased metabolic activity within the sternal manubrium and L2 vertebral body. 2. No evidence of extra osseous metastatic disease.      Discussed the use of AI scribe software for clinical note transcription with the patient, who gave verbal consent to proceed.  History of Present Illness Shirley Wells is an 83 year old female with metastatic breast cancer who presents for follow-up.  She takes her medication three days a week. She developed oral thrush, characterized by a 'sandpaper' feeling on her tongue with a white film, managed by brushing her teeth and rinsing with peroxide.  She experiences significant fatigue and finds it difficult to care for herself. She feels more stable when walking after starting the medication but still experiences weakness. Her appetite is poor, and she has lost 3-4 pounds since her last visit, now weighing 127 pounds. She can keep down what she forces  herself to eat.  She experiences back pain, described as 'about like usual', and denies any new pain. She takes hydrocodone  and Tylenol  for pain management, which she finds effective.     All other systems were reviewed with the patient and are negative.  MEDICAL HISTORY:  Past Medical History:  Diagnosis Date   CHICKENPOX, HX OF 03/21/2010   Qualifier: Diagnosis of  By: Wilhemina RMA, Lucy     CVA (cerebral infarction)  02/2010   no residual deficits, a/w mid basal art stenosis, declined IC stent as rec by IR/neuro   Dyslipidemia    intol of statin due to >3x increase LFTs   GERD (gastroesophageal reflux disease)    hx   Hypertension, essential    Malignant neoplasm of upper-inner quadrant of left breast in female, estrogen receptor positive (HCC) 06/20/2017   Osteoarthritis    Stroke [facility])     SURGICAL HISTORY: Past Surgical History:  Procedure Laterality Date   CATARACT EXTRACTION W/PHACO Left 07/10/2024   Procedure: PHACOEMULSIFICATION, CATARACT, WITH IOL INSERTION;  Surgeon: Harrie Agent, MD;  Location: [facility];  Service: Ophthalmology;  Laterality: Left;  CDE: 7.13   CHOLECYSTECTOMY  1981   INSERTION, STENT, DRUG-ELUTING, LACRIMAL CANALICULUS Left 07/10/2024   Procedure: INSERTION, STENT, DRUG-ELUTING, LACRIMAL CANALICULUS;  Surgeon: Harrie Agent, MD;  Location: [facility];  Service: Ophthalmology;  Laterality: Left;   MASTECTOMY W/ SENTINEL NODE BIOPSY Left 07/30/2017   Procedure: LEFT TOTAL MASTECTOMY WITH AXILLARY SENTINEL LYMPH NODE BIOPSY;  Surgeon: Shirley Cough, MD;  Location: [facility];  Service: General;  Laterality: Left;    I have reviewed the social history and family history with the patient and they are unchanged from previous note.  ALLERGIES:  is allergic to influenza vaccines, pneumococcal vaccines, simvastatin, tetanus toxoid-containing vaccines, and zoster vaccine live.  MEDICATIONS:  Current Outpatient Medications  Medication Sig Dispense Refill   acetaminophen  (TYLENOL ) 650 MG CR tablet Take 1,300 mg by mouth every 8 (eight) hours as needed for pain.     amLODipine  (NORVASC ) 2.5 MG tablet Take 1 tablet (2.5 mg total) by mouth daily. 90 tablet 3   aspirin  81 MG tablet Take 81 mg by mouth 2 (two) times a week.     capivasertib  (TRUQAP ) 200 MG pack Take 1 tablet (200 mg total) by mouth 2 (two) times daily. Take for 3 days, then hold for 4 days. Repeat every 7 days. 64 tablet 1    cetirizine (ZYRTEC) 10 MG tablet Take 10 mg by mouth daily. For rash PPX while on Truqap      clopidogrel  (PLAVIX ) 75 MG tablet TAKE 1 TABLET(75 MG) BY MOUTH DAILY 90 tablet 1   cyclopentolate (CYCLODRYL,CYCLOGYL) 1 % ophthalmic solution Place 1 drop into the left eye once.     erythromycin ophthalmic ointment      loratadine (CLARITIN) 10 MG tablet Take 10 mg by mouth daily as needed for allergies.     megestrol (MEGACE) 40 MG tablet Take 3 tablets (120 mg total) by mouth in the morning, at noon, and at bedtime. 100 tablet 0   Menthol, Topical Analgesic, (BIOFREEZE EX) Apply 1 application topically daily as needed (muscle pain).     milk thistle 175 MG tablet Take 175 mg by mouth daily.     moxifloxacin  (VIGAMOX ) 0.5 % ophthalmic solution Place 1 drop into the left eye 3 (three) times daily.     Multiple Vitamin (MULTIVITAMIN) capsule Take 1 capsule by mouth daily.     prednisoLONE acetate (PRED FORTE)  1 % ophthalmic suspension SHAKE LIQUID AND INSTILL 1 DROP IN LEFT EYE TWICE DAILY FOR 2 WEEKS THEN STOP     ZIRGAN 0.15 % GEL Place 1 drop into the left eye 5 (five) times daily.     hydrALAZINE  (APRESOLINE ) 50 MG tablet Take 1.5 tablets (75 mg total) by mouth in the morning and at bedtime.     HYDROcodone -acetaminophen  (NORCO) 10-325 MG tablet Take 1 tablet by mouth every 8 (eight) hours as needed. 60 tablet 0   valACYclovir  (VALTREX ) 1000 MG tablet Take 1,000 mg by mouth daily.     No current facility-administered medications for this visit.    PHYSICAL EXAMINATION: ECOG PERFORMANCE STATUS: 3 - Symptomatic, >50% confined to bed  Vitals:   09/21/24 1114  BP: 138/60  Pulse: 98  Resp: (!) 97  Temp: 98.3 F (36.8 C)   Wt Readings from Last 3 Encounters:  09/21/24 127 lb (57.6 kg)  09/21/24 127 lb 11.2 oz (57.9 kg)  09/07/24 131 lb 4.8 oz (59.6 kg)     GENERAL:alert, no distress and comfortable SKIN: skin color, texture, turgor are normal, no rashes or significant lesions EYES:  normal, Conjunctiva are pink and non-injected, sclera clear NECK: supple, thyroid  normal size, non-tender, without nodularity LYMPH:  no palpable lymphadenopathy in the cervical, axillary  LUNGS: clear to auscultation and percussion with normal breathing effort HEART: regular rate & rhythm and no murmurs and no lower extremity edema ABDOMEN:abdomen soft, non-tender and normal bowel sounds Musculoskeletal:no cyanosis of digits and no clubbing  NEURO: alert & oriented x 3 with fluent speech, no focal motor/sensory deficits  Physical Exam   LABORATORY DATA:  I have reviewed the data as listed    Latest Ref Rng & Units 09/21/2024   11:06 AM 09/07/2024    9:34 AM 08/24/2024   10:22 AM  CBC  WBC 4.0 - 10.5 K/uL 4.4  4.9  4.5   Hemoglobin 12.0 - 15.0 g/dL 8.9  8.5  8.0   Hematocrit 36.0 - 46.0 % 26.4  26.1  24.6   Platelets 150 - 400 K/uL 221  258  179         Latest Ref Rng & Units 09/21/2024   11:06 AM 09/07/2024    9:34 AM 08/24/2024   10:22 AM  CMP  Glucose 70 - 99 mg/dL 879  829  869   BUN 8 - 23 mg/dL 25  29  36   Creatinine 0.44 - 1.00 mg/dL 8.09  8.06  8.21   Sodium 135 - 145 mmol/L 142  142  143   Potassium 3.5 - 5.1 mmol/L 4.4  3.9  4.6   Chloride 98 - 111 mmol/L 107  105  108   CO2 22 - 32 mmol/L 27  26  27    Calcium 8.9 - 10.3 mg/dL 9.5  9.8  89.9   Total Protein 6.5 - 8.1 g/dL 6.7  6.8  6.8   Total Bilirubin 0.0 - 1.2 mg/dL 0.4  0.4  0.5   Alkaline Phos 38 - 126 U/L 318  177  225   AST 15 - 41 U/L 36  23  121   ALT 0 - 44 U/L 23  13  22        RADIOGRAPHIC STUDIES: I have personally reviewed the radiological images as listed and agreed with the findings in the report. No results found.    Orders Placed This Encounter  Procedures   NM PET Image Restag (PS) Skull Base To  Thigh    Standing Status:   Future    Expected Date:   10/26/2024    Expiration Date:   09/21/2025    If indicated for the ordered procedure, I authorize the administration of a  radiopharmaceutical per Radiology protocol:   Yes    Preferred imaging location?:   [facility]   All questions were answered. The patient knows to call the clinic with any problems, questions or concerns. No barriers to learning was detected. The total time spent in the appointment was 40 minutes, including review of chart and various tests results, discussions about plan of care and coordination of care plan     Onita Mattock, MD 09/21/2024

## 2024-09-22 ENCOUNTER — Other Ambulatory Visit: Payer: Self-pay

## 2024-09-22 LAB — CANCER ANTIGEN 27.29: CA 27.29: 496.4 U/mL — ABNORMAL HIGH (ref 0.0–38.6)

## 2024-09-29 DIAGNOSIS — H2640 Unspecified secondary cataract: Secondary | ICD-10-CM | POA: Diagnosis not present

## 2024-09-29 DIAGNOSIS — H16232 Neurotrophic keratoconjunctivitis, left eye: Secondary | ICD-10-CM | POA: Diagnosis not present

## 2024-09-29 DIAGNOSIS — H179 Unspecified corneal scar and opacity: Secondary | ICD-10-CM | POA: Diagnosis not present

## 2024-09-29 DIAGNOSIS — Z961 Presence of intraocular lens: Secondary | ICD-10-CM | POA: Diagnosis not present

## 2024-09-29 DIAGNOSIS — H1712 Central corneal opacity, left eye: Secondary | ICD-10-CM | POA: Diagnosis not present

## 2024-09-29 DIAGNOSIS — Z9842 Cataract extraction status, left eye: Secondary | ICD-10-CM | POA: Diagnosis not present

## 2024-09-29 DIAGNOSIS — H26491 Other secondary cataract, right eye: Secondary | ICD-10-CM | POA: Diagnosis not present

## 2024-10-05 ENCOUNTER — Encounter: Payer: Self-pay | Admitting: Hematology

## 2024-10-05 ENCOUNTER — Other Ambulatory Visit (HOSPITAL_COMMUNITY): Payer: Self-pay

## 2024-10-05 ENCOUNTER — Telehealth: Payer: Self-pay

## 2024-10-05 NOTE — Telephone Encounter (Signed)
 Oral Oncology Patient Advocate Encounter  Was successful in securing patient a $7500 grant from [facility] to provide copayment coverage for Ibrance .  This will keep the out of pocket expense at $0.     [employer] ID: [id number]   The billing information is as follows and has been shared with [facility].    RxBin: [id number] PCN: [id number] Member ID: [id number] Group ID: [id number] Dates of Eligibility: 10/19/24 through 10/18/25  Fund:  Breast  Lucie Lamer, CPhT [place]  [facility] Oncology Pharmacy Patient Advocate Specialist II [place] Phone: [phone]  Fax: [phone] [email or web address]

## 2024-10-07 ENCOUNTER — Other Ambulatory Visit: Payer: Self-pay

## 2024-10-09 ENCOUNTER — Other Ambulatory Visit: Payer: Self-pay

## 2024-10-13 ENCOUNTER — Other Ambulatory Visit (HOSPITAL_COMMUNITY): Payer: Self-pay

## 2024-10-15 ENCOUNTER — Other Ambulatory Visit: Payer: Self-pay

## 2024-10-15 ENCOUNTER — Other Ambulatory Visit (HOSPITAL_COMMUNITY): Payer: Self-pay

## 2024-10-15 NOTE — Progress Notes (Signed)
 Specialty Pharmacy Refill Coordination Note  Shirley Wells is a 83 y.o. female contacted today regarding refills of specialty medication(s) Capivasertib  (TRUQAP )   Patient requested Delivery   Delivery date: 10/16/24   Verified address: [address] [place] [address]   Medication will be filled on: 10/15/24

## 2024-10-15 NOTE — Progress Notes (Signed)
 Clinical Intervention Note  Clinical Intervention Notes: Patient called to inquire whether dry mouth is a possible side effect of Truqap . I confirmed that it is and recommended trying Biotne mouth rinse for relief. She was receptive to these suggestions. The patient also had questions about Truqap 's mechanism of action, which we reviewed together. All questions were addressed, and she verbalized understanding.   Clinical Intervention Outcomes: Prevention of an adverse drug event   Silvano LOISE Blair Karel Santa

## 2024-10-21 DIAGNOSIS — H26491 Other secondary cataract, right eye: Secondary | ICD-10-CM | POA: Diagnosis not present

## 2024-10-21 DIAGNOSIS — H179 Unspecified corneal scar and opacity: Secondary | ICD-10-CM | POA: Diagnosis not present

## 2024-10-21 DIAGNOSIS — H16232 Neurotrophic keratoconjunctivitis, left eye: Secondary | ICD-10-CM | POA: Diagnosis not present

## 2024-10-21 DIAGNOSIS — Z961 Presence of intraocular lens: Secondary | ICD-10-CM | POA: Diagnosis not present

## 2024-10-23 ENCOUNTER — Other Ambulatory Visit: Payer: Self-pay

## 2024-10-23 ENCOUNTER — Other Ambulatory Visit: Payer: Self-pay | Admitting: Hematology

## 2024-10-23 DIAGNOSIS — C7951 Secondary malignant neoplasm of bone: Secondary | ICD-10-CM

## 2024-10-23 DIAGNOSIS — C50212 Malignant neoplasm of upper-inner quadrant of left female breast: Secondary | ICD-10-CM

## 2024-10-23 DIAGNOSIS — D649 Anemia, unspecified: Secondary | ICD-10-CM

## 2024-10-26 ENCOUNTER — Inpatient Hospital Stay

## 2024-10-26 ENCOUNTER — Inpatient Hospital Stay: Attending: Hematology

## 2024-10-26 ENCOUNTER — Encounter (HOSPITAL_COMMUNITY)
Admission: RE | Admit: 2024-10-26 | Discharge: 2024-10-26 | Disposition: A | Discharge status: Home / Self Care | Source: Ambulatory Visit | Attending: Hematology | Admitting: Hematology

## 2024-10-26 VITALS — BP 143/62 | HR 87 | Temp 97.6°F | Resp 15

## 2024-10-26 DIAGNOSIS — Z9221 Personal history of antineoplastic chemotherapy: Secondary | ICD-10-CM | POA: Diagnosis not present

## 2024-10-26 DIAGNOSIS — Z1732 Human epidermal growth factor receptor 2 negative status: Secondary | ICD-10-CM | POA: Insufficient documentation

## 2024-10-26 DIAGNOSIS — G893 Neoplasm related pain (acute) (chronic): Secondary | ICD-10-CM | POA: Diagnosis not present

## 2024-10-26 DIAGNOSIS — N281 Cyst of kidney, acquired: Secondary | ICD-10-CM | POA: Insufficient documentation

## 2024-10-26 DIAGNOSIS — J90 Pleural effusion, not elsewhere classified: Secondary | ICD-10-CM | POA: Diagnosis not present

## 2024-10-26 DIAGNOSIS — Z1721 Progesterone receptor positive status: Secondary | ICD-10-CM | POA: Insufficient documentation

## 2024-10-26 DIAGNOSIS — I700 Atherosclerosis of aorta: Secondary | ICD-10-CM | POA: Diagnosis not present

## 2024-10-26 DIAGNOSIS — C50212 Malignant neoplasm of upper-inner quadrant of left female breast: Secondary | ICD-10-CM | POA: Insufficient documentation

## 2024-10-26 DIAGNOSIS — K5730 Diverticulosis of large intestine without perforation or abscess without bleeding: Secondary | ICD-10-CM | POA: Diagnosis not present

## 2024-10-26 DIAGNOSIS — Z79891 Long term (current) use of opiate analgesic: Secondary | ICD-10-CM | POA: Insufficient documentation

## 2024-10-26 DIAGNOSIS — D649 Anemia, unspecified: Secondary | ICD-10-CM

## 2024-10-26 DIAGNOSIS — Z170 Estrogen receptor positive status [ER+]: Secondary | ICD-10-CM

## 2024-10-26 DIAGNOSIS — Z7902 Long term (current) use of antithrombotics/antiplatelets: Secondary | ICD-10-CM | POA: Diagnosis not present

## 2024-10-26 DIAGNOSIS — Z923 Personal history of irradiation: Secondary | ICD-10-CM | POA: Diagnosis not present

## 2024-10-26 DIAGNOSIS — Z79811 Long term (current) use of aromatase inhibitors: Secondary | ICD-10-CM | POA: Insufficient documentation

## 2024-10-26 DIAGNOSIS — C7951 Secondary malignant neoplasm of bone: Secondary | ICD-10-CM | POA: Insufficient documentation

## 2024-10-26 DIAGNOSIS — M549 Dorsalgia, unspecified: Secondary | ICD-10-CM | POA: Insufficient documentation

## 2024-10-26 DIAGNOSIS — C787 Secondary malignant neoplasm of liver and intrahepatic bile duct: Secondary | ICD-10-CM | POA: Insufficient documentation

## 2024-10-26 DIAGNOSIS — Z5111 Encounter for antineoplastic chemotherapy: Secondary | ICD-10-CM | POA: Insufficient documentation

## 2024-10-26 LAB — CBC WITH DIFFERENTIAL (CANCER CENTER ONLY)
Abs Immature Granulocytes: 0.33 K/uL — ABNORMAL HIGH (ref 0.00–0.07)
Basophils Absolute: 0.1 K/uL (ref 0.0–0.1)
Basophils Relative: 1 %
Eosinophils Absolute: 0.2 K/uL (ref 0.0–0.5)
Eosinophils Relative: 3 %
HCT: 24.8 % — ABNORMAL LOW (ref 36.0–46.0)
Hemoglobin: 7.7 g/dL — ABNORMAL LOW (ref 12.0–15.0)
Immature Granulocytes: 4 %
Lymphocytes Relative: 26 %
Lymphs Abs: 2 K/uL (ref 0.7–4.0)
MCH: 35.3 pg — ABNORMAL HIGH (ref 26.0–34.0)
MCHC: 31 g/dL (ref 30.0–36.0)
MCV: 113.8 fL — ABNORMAL HIGH (ref 80.0–100.0)
Monocytes Absolute: 0.6 K/uL (ref 0.1–1.0)
Monocytes Relative: 8 %
Neutro Abs: 4.7 K/uL (ref 1.7–7.7)
Neutrophils Relative %: 58 %
Platelet Count: 206 K/uL (ref 150–400)
RBC: 2.18 MIL/uL — ABNORMAL LOW (ref 3.87–5.11)
RDW: 15.8 % — ABNORMAL HIGH (ref 11.5–15.5)
WBC Count: 7.9 K/uL (ref 4.0–10.5)
nRBC: 1.3 % — ABNORMAL HIGH (ref 0.0–0.2)

## 2024-10-26 LAB — COMPREHENSIVE METABOLIC PANEL WITH GFR
ALT: 22 U/L (ref 0–44)
AST: 83 U/L — ABNORMAL HIGH (ref 15–41)
Albumin: 3.5 g/dL (ref 3.5–5.0)
Alkaline Phosphatase: 387 U/L — ABNORMAL HIGH (ref 38–126)
Anion gap: 14 (ref 5–15)
BUN: 23 mg/dL (ref 8–23)
CO2: 22 mmol/L (ref 22–32)
Calcium: 9.9 mg/dL (ref 8.9–10.3)
Chloride: 106 mmol/L (ref 98–111)
Creatinine, Ser: 1.52 mg/dL — ABNORMAL HIGH (ref 0.44–1.00)
GFR, Estimated: 33 mL/min — ABNORMAL LOW (ref >60)
Glucose, Bld: 113 mg/dL — ABNORMAL HIGH (ref 70–99)
Potassium: 4.4 mmol/L (ref 3.5–5.1)
Sodium: 142 mmol/L (ref 135–145)
Total Bilirubin: 0.5 mg/dL (ref 0.0–1.2)
Total Protein: 6.7 g/dL (ref 6.5–8.1)

## 2024-10-26 LAB — LIPID PANEL
Cholesterol: 194 mg/dL (ref 0–200)
HDL: 54 mg/dL (ref >40)
LDL Cholesterol: 92 mg/dL (ref 0–99)
Total CHOL/HDL Ratio: 3.6 ratio
Triglycerides: 239 mg/dL — ABNORMAL HIGH (ref <150)
VLDL: 48 mg/dL — ABNORMAL HIGH (ref 0–40)

## 2024-10-26 LAB — VITAMIN B12: Vitamin B-12: 1031 pg/mL — ABNORMAL HIGH (ref 180–914)

## 2024-10-26 LAB — GLUCOSE, CAPILLARY: Glucose-Capillary: 117 mg/dL — ABNORMAL HIGH (ref 70–99)

## 2024-10-26 LAB — FERRITIN: Ferritin: 1465 ng/mL — ABNORMAL HIGH (ref 11–307)

## 2024-10-26 MED ORDER — FULVESTRANT 250 MG/5ML IM SOSY
500.0000 mg | PREFILLED_SYRINGE | Freq: Once | INTRAMUSCULAR | Status: AC
  Administered 2024-10-26: 500 mg via INTRAMUSCULAR
  Filled 2024-10-26: qty 10

## 2024-10-26 MED ORDER — FLUDEOXYGLUCOSE F - 18 (FDG) INJECTION
6.3500 | Freq: Once | INTRAVENOUS | Status: AC | PRN
  Administered 2024-10-26: 6.29 via INTRAVENOUS

## 2024-10-29 ENCOUNTER — Inpatient Hospital Stay: Admitting: Hematology

## 2024-10-29 DIAGNOSIS — C50212 Malignant neoplasm of upper-inner quadrant of left female breast: Secondary | ICD-10-CM | POA: Diagnosis not present

## 2024-10-29 DIAGNOSIS — Z170 Estrogen receptor positive status [ER+]: Secondary | ICD-10-CM | POA: Diagnosis not present

## 2024-10-29 MED ORDER — HYDROCODONE-ACETAMINOPHEN 10-325 MG PO TABS: 1.0000 | ORAL_TABLET | Freq: Three times a day (TID) | ORAL | 0 refills | Status: AC | PRN

## 2024-10-29 NOTE — Progress Notes (Signed)
 [facility]   [phone] [phone]   Clinic Follow up Note   Patient Care Team: Geofm Glade PARAS, MD as PCP - General (Internal Medicine) Rosemarie Eather RAMAN, MD as Consulting Physician (Neurology) Ebbie Cough, MD as Consulting Physician (General Surgery) Lanny Callander, MD as Consulting Physician (Hematology) Dewey Rush, MD as Consulting Physician (Radiation Oncology) 10/29/2024  I connected with Shirley Wells on 10/29/24 at  1:00 PM EST by telephone and verified that I am speaking with the correct person using two identifiers.   I discussed the limitations, risks, security and privacy concerns of performing an evaluation and management service by telephone and the availability of in person appointments. I also discussed with the patient that there may be a patient responsible charge related to this service. The patient expressed understanding and agreed to proceed.   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 S/p left total mastectomy. She declined antiestrogen therapy due to concerns of side effects. She lost follow up after 07/2017.   -After 6 months of left hip and ribcage pain she was found to have diffuse bone metastasis on 11/28/20 PET. This was confirmed on 12/09/20 bone biopsy --she started first-line Anastrozole  in 11/2020 and Ibrance  on 01/09/21, not very compliant with Ibrance   -progressed on PET 10/03/2022 -changed her tx to fulvestrant  injection and Verzenio . 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 - I recommend PIK3CA inhibitor, she declined due to the potential side effects. 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 Current treatment is ineffective, and chemotherapy is not recommended due to potential intolerance and focus on quality of life. She prefers quality of life over quantity of life. - Discontinued current cancer treatment - Focus on quality of life - Offered radiation therapy for pain management if needed - I recommend home hospice, but she declined for now.  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 She reports taking two hydrocodone  daily and additional Tylenol  as needed. Pain management is a priority to maintain quality of life. - Refilled hydrocodone  prescription at [facility] in [place] - Continue current pain management regimen with hydrocodone  and Tylenol  as needed  Plan - Patient has stopped capivasertib  due to poor tolerance. - I reviewed her restaging PET scan, which unfortunately showed disease progression in both liver and bones. - Will stop her current therapy.  - I recommend home hospice, she declined.  - Continue supportive care, she will  call me if she needs to be seen or if she is ready for hospice care    SUMMARY OF ONCOLOGIC HISTORY: Oncology  History Overview Note   Cancer Staging  Malignant neoplasm of upper-inner quadrant of left breast in female, estrogen receptor positive (HCC) Staging form: Breast, AJCC 8th Edition - Clinical stage from 06/18/2017: Stage IB (cT2, cN0, cM0, G2, ER+, PR+, HER2-) - Signed by Lanny Callander, MD on 06/24/2017 Histologic grading system: 3 grade system - Pathologic stage from 07/30/2017: Stage IA (pT2, pN0, cM0, G2, ER+, PR+, HER2-, Oncotype DX score: 23) - Signed by Lanny Callander, MD on 08/23/2017 Neoadjuvant therapy: No Nuclear grade: G2 Multigene prognostic tests performed: Oncotype DX Recurrence score range: Greater than or equal to 11 Histologic grading system: 3 grade system Residual tumor (R): R0 - None Laterality: Left     Malignant neoplasm of upper-inner quadrant of left breast in female, estrogen receptor positive (HCC)  06/17/2017 Mammogram   US  and MM diagnostic Breast Tomo Bilateral  IMPRESSION: 1. 3.4 palpable mass in the 10 o'clock position of the left breast is highly suspicious for malignancy.  2. No evidence of malignancy in the right breast.  3. Ultrasound of the left axilla shows normal sized axillary lymph nodes.   06/18/2017 Initial Biopsy   Diagnosis 06/18/17 Breast, left, needle core biopsy, 10:00 o'clock - INVASIVE MAMMARY CARCINOMA. At least G2  The malignant cells are negative for E-Cadherin, supporting a lobular phenotype.   06/18/2017 Initial Diagnosis   Malignant neoplasm of upper-inner quadrant of left breast in female, estrogen receptor positive (HCC)   06/18/2017 Receptors her2   ER 95%+, PR 25%+, both strong staining  HER2- Ki67 30%    07/30/2017 Surgery   LEFT TOTAL MASTECTOMY WITH AXILLARY SENTINEL LYMPH NODE BIOPSY by Dr. Ebbie on 07/30/17   07/30/2017 Pathology Results   Diagnosis 07/30/17 1. Breast, simple mastectomy, Left Total - INVASIVE LOBULAR CARCINOMA, GRADE 2, SPANNING 3.5 CM. - LOBULAR CARCINOMA IN SITU. - INVASIVE CARCINOMA IS BROADLY 0.1 CM FROM  POSTERIOR MARGIN. - PERINEURAL INVASION PRESENT. - SEE ONCOLOGY TABLE. 2. Lymph node, sentinel, biopsy, Left Axillary - ONE OF ONE LYMPH NODES NEGATIVE FOR CARCINOMA (0/1). 3. Lymph node, sentinel, biopsy, Left - ONE OF ONE LYMPH NODES NEGATIVE FOR CARCINOMA (0/1).   07/30/2017 Oncotype testing   Oncotype 07/30/17 Recurrence score is 23, an intermediate risk With a 10-year risk of recurrence at 15% with tamoxifen  alone.     11/11/2020 Imaging   MRI Lumbar Spine  IMPRESSION: Diffuse osseous metastases. Chronic inferior L2 endplate deformity with mild height loss.   Likely late subacute pathologic fracture deformities involving the superior L1 endplate and right L3 lamina.   Multilevel spondylosis. Moderate to severe spinal canal and neural foraminal narrowing at the L3-4 and L4-5 levels.   Mild to moderate spinal canal and neural foraminal narrowing at the L5-S1 level.   11/28/2020 PET scan   IMPRESSION: 1. Widespread and diffuse hypermetabolic bony metastases. 2. No evidence for soft tissue metastases in the neck, chest, abdomen, or pelvis. 3.  Aortic Atherosclerois (ICD10-170.0)     11/29/2020 -  Chemotherapy   Zometa  q68months starting 11/29/20. Held after first dose. Given her CKD will only give when she has hypercalcemia and may switch to Prolia.     12/04/2020 Imaging   MRI Brain  IMPRESSION: No evidence of intracranial metastatic disease.   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   Mild chronic microvascular ischemic changes. Small chronic right cerebellar  infarct.     12/07/2020 - 12/21/2020 Radiation Therapy   palliative RT to left hip with Dr Dewey on 12/07/20-12/21/20   12/09/2020 Pathology Results   FINAL MICROSCOPIC DIAGNOSIS:   A. BONE, BIOPSY:  - Metastatic carcinoma.  - See comment.   COMMENT:  The morphology is compatible with metastatic lobular breast carcinoma.  Estrogen receptor, progesterone receptor and HER2/neu will be  performed.   ADDENDUM:   PROGNOSTIC INDICATOR RESULTS:   Immunohistochemical and morphometric analysis performed manually   The tumor cells are EQUIVOCAL for Her2 (2+).  Her2 by FISH will be performed and the results will be reported  separately.   Estrogen Receptor:       POSITIVE, 50%, MODERATE-STRONG STAINING  Progesterone Receptor:   POSITIVE, 15%, MODERATE-STRONG STAINING    11/2020 -  Anti-estrogen oral therapy   First-line Anti-estrogen therapy Anastrozole  1mg  daily starting in 11/2020 and Ibrance  starting in 12/2020    01/09/2021 -  Chemotherapy   Ibrance  3 weeks on/1 weeks off starting beginning of 01/09/21. Reduced to 75mg  starting with C2 (02/13/21) due to cytopenia and fatigue. Reduced to 75mg  2 weeks on/1 week off starting C3 on 03/13/21)    03/31/2021 PET scan   IMPRESSION: 1. Dramatic reduction in metabolic activity throughout the skeleton. Metabolic activity of diffuse sclerotic skeletal lesions is now essentially at background blood pool level. No focal metabolic activity. 2. Persistent diffuse confluent sclerotic skeletal metastasis unchanged. 3. No evidence of soft tissue breast cancer metastasis.   10/13/2021 PET scan   IMPRESSION: 1. Widespread osseous metastatic disease again noted with areas of minimally increased metabolic activity within the sternal manubrium and L2 vertebral body. 2. No evidence of extra osseous metastatic disease.     Discussed the use of AI scribe software for clinical note transcription with the patient, who gave verbal consent to proceed.  History of Present Illness ANGELLE ISAIS is an 83 year old female with metastatic breast cancer who presents for discussion of her PET scan results.  She recently stopped her cancer medication because of loss of appetite and loss of taste. Since stopping, her appetite has improved and she was able to eat well over Thanksgiving. She is worried about restarting the medication because of these previous  side effects.  She has chronic back pain controlled with hydrocodone  (Norco) twice daily, with Tylenol  for breakthrough pain, and she is running low on hydrocodone .  She takes clopidogrel  (Plavix ) 75 mg prescribed by her cardiologist, Dr. Geofm, and she is also running low on this medication.  She previously tried an appetite stimulant called Maggie's without benefit and has stopped it.     REVIEW OF SYSTEMS:   Constitutional: Denies fevers, chills or abnormal weight loss Eyes: Denies blurriness of vision Ears, nose, mouth, throat, and face: Denies mucositis or sore throat Respiratory: Denies cough, dyspnea or wheezes Cardiovascular: Denies palpitation, chest discomfort or lower extremity swelling Gastrointestinal:  Denies nausea, heartburn or change in bowel habits Skin: Denies abnormal skin rashes Lymphatics: Denies new lymphadenopathy or easy bruising Neurological:Denies numbness, tingling or new weaknesses Behavioral/Psych: Mood is stable, no new changes  All other systems were reviewed with the patient and are negative.  MEDICAL HISTORY:  Past Medical History:  Diagnosis Date   CHICKENPOX, HX OF 03/21/2010   Qualifier: Diagnosis of  By: Wilhemina RMA, Lucy     CVA (cerebral infarction) 02/2010   no residual deficits, a/w mid basal art stenosis, declined IC stent as rec by IR/neuro   Dyslipidemia  intol of statin due to >3x increase LFTs   GERD (gastroesophageal reflux disease)    hx   Hypertension, essential    Malignant neoplasm of upper-inner quadrant of left breast in female, estrogen receptor positive (HCC) 06/20/2017   Osteoarthritis    Stroke [facility])     SURGICAL HISTORY: Past Surgical History:  Procedure Laterality Date   CATARACT EXTRACTION W/PHACO Left 07/10/2024   Procedure: PHACOEMULSIFICATION, CATARACT, WITH IOL INSERTION;  Surgeon: Harrie Agent, MD;  Location: [facility];  Service: Ophthalmology;  Laterality: Left;  CDE: 7.13   CHOLECYSTECTOMY  1981    INSERTION, STENT, DRUG-ELUTING, LACRIMAL CANALICULUS Left 07/10/2024   Procedure: INSERTION, STENT, DRUG-ELUTING, LACRIMAL CANALICULUS;  Surgeon: Harrie Agent, MD;  Location: [facility];  Service: Ophthalmology;  Laterality: Left;   MASTECTOMY W/ SENTINEL NODE BIOPSY Left 07/30/2017   Procedure: LEFT TOTAL MASTECTOMY WITH AXILLARY SENTINEL LYMPH NODE BIOPSY;  Surgeon: Ebbie Cough, MD;  Location: [facility] OR;  Service: General;  Laterality: Left;    I have reviewed the social history and family history with the patient and they are unchanged from previous note.  ALLERGIES:  is allergic to influenza vaccines, pneumococcal vaccines, simvastatin, tetanus toxoid-containing vaccines, and zoster vaccine live.  MEDICATIONS:  Current Outpatient Medications  Medication Sig Dispense Refill   acetaminophen  (TYLENOL ) 650 MG CR tablet Take 1,300 mg by mouth every 8 (eight) hours as needed for pain.     amLODipine  (NORVASC ) 2.5 MG tablet Take 1 tablet (2.5 mg total) by mouth daily. 90 tablet 3   aspirin  81 MG tablet Take 81 mg by mouth 2 (two) times a week.     cetirizine (ZYRTEC) 10 MG tablet Take 10 mg by mouth daily. For rash PPX while on Truqap      clopidogrel  (PLAVIX ) 75 MG tablet TAKE 1 TABLET(75 MG) BY MOUTH DAILY 90 tablet 1   cyclopentolate (CYCLODRYL,CYCLOGYL) 1 % ophthalmic solution Place 1 drop into the left eye once.     erythromycin ophthalmic ointment      hydrALAZINE  (APRESOLINE ) 50 MG tablet Take 1.5 tablets (75 mg total) by mouth in the morning and at bedtime.     HYDROcodone -acetaminophen  (NORCO) 10-325 MG tablet Take 1 tablet by mouth every 8 (eight) hours as needed. 90 tablet 0   loratadine (CLARITIN) 10 MG tablet Take 10 mg by mouth daily as needed for allergies.     megestrol  (MEGACE ) 40 MG tablet Take 3 tablets (120 mg total) by mouth in the morning, at noon, and at bedtime. 100 tablet 0   Menthol, Topical Analgesic, (BIOFREEZE EX) Apply 1 application topically daily as needed (muscle  pain).     milk thistle 175 MG tablet Take 175 mg by mouth daily.     moxifloxacin  (VIGAMOX ) 0.5 % ophthalmic solution Place 1 drop into the left eye 3 (three) times daily.     Multiple Vitamin (MULTIVITAMIN) capsule Take 1 capsule by mouth daily.     prednisoLONE acetate (PRED FORTE) 1 % ophthalmic suspension SHAKE LIQUID AND INSTILL 1 DROP IN LEFT EYE TWICE DAILY FOR 2 WEEKS THEN STOP     valACYclovir  (VALTREX ) 1000 MG tablet Take 1,000 mg by mouth daily.     ZIRGAN 0.15 % GEL Place 1 drop into the left eye 5 (five) times daily.     No current facility-administered medications for this visit.    PHYSICAL EXAMINATION: Not performed   LABORATORY DATA:  I have reviewed the data as listed    Latest Ref Rng & Units 10/26/2024  11:47 AM 09/21/2024   11:06 AM 09/07/2024    9:34 AM  CBC  WBC 4.0 - 10.5 K/uL 7.9  4.4  4.9   Hemoglobin 12.0 - 15.0 g/dL 7.7  8.9  8.5   Hematocrit 36.0 - 46.0 % 24.8  26.4  26.1   Platelets 150 - 400 K/uL 206  221  258         Latest Ref Rng & Units 10/26/2024   11:47 AM 09/21/2024   11:06 AM 09/07/2024    9:34 AM  CMP  Glucose 70 - 99 mg/dL 886  879  829   BUN 8 - 23 mg/dL 23  25  29    Creatinine 0.44 - 1.00 mg/dL 8.47  8.09  8.06   Sodium 135 - 145 mmol/L 142  142  142   Potassium 3.5 - 5.1 mmol/L 4.4  4.4  3.9   Chloride 98 - 111 mmol/L 106  107  105   CO2 22 - 32 mmol/L 22  27  26    Calcium 8.9 - 10.3 mg/dL 9.9  9.5  9.8   Total Protein 6.5 - 8.1 g/dL 6.7  6.7  6.8   Total Bilirubin 0.0 - 1.2 mg/dL 0.5  0.4  0.4   Alkaline Phos 38 - 126 U/L 387  318  177   AST 15 - 41 U/L 83  36  23   ALT 0 - 44 U/L 22  23  13        RADIOGRAPHIC STUDIES: I have personally reviewed the radiological images as listed and agreed with the findings in the report. No results found.     I discussed the assessment and treatment plan with the patient. The patient was provided an opportunity to ask questions and all were answered. The patient agreed with the  plan and demonstrated an understanding of the instructions.   The patient was advised to call back or seek an in-person evaluation if the symptoms worsen or if the condition fails to improve as anticipated.  I provided 25 minutes of non face-to-face telephone visit time during this encounter, including review of chart and various tests results, discussions about plan of care and coordination of care plan.    Onita Mattock, MD 10/29/24

## 2024-10-29 NOTE — Assessment & Plan Note (Signed)
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 S/p left total mastectomy. She declined antiestrogen therapy due to concerns of side effects. She lost follow up after 07/2017.   -After 6 months of left hip and ribcage pain she was found to have diffuse bone metastasis on 11/28/20 PET. This was confirmed on 12/09/20 bone biopsy --she started first-line Anastrozole  in 11/2020 and Ibrance  on 01/09/21, not very compliant with Ibrance   -progressed on PET 10/03/2022 -changed her tx to fulvestrant  injection and Verzenio . 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 - I recommend PIK3CA inhibitor, she declined due to the potential side effects. -added Ibrance  to elacestrant  in June 2025 -PET on 07/31/2024 showed disease progression with new liver mets  -I recommend changing treatment to Fulvestrant  and capivasertib , she started on 08/24/2024, and required dose reduction due to fatigue

## 2024-11-05 ENCOUNTER — Other Ambulatory Visit: Payer: Self-pay

## 2024-11-06 ENCOUNTER — Other Ambulatory Visit: Payer: Self-pay

## 2024-11-06 NOTE — Progress Notes (Signed)
 Disenrolled - Truqap  discontinued in [place] and per 12.4.25 office visit notes patient stopped therapy.

## 2024-11-10 ENCOUNTER — Telehealth: Payer: Self-pay

## 2024-11-10 NOTE — Telephone Encounter (Unsigned)
 Copied from CRM #[id number]. Topic: Clinical - Order For Equipment >> Nov 10, 2024  4:33 PM Amy B wrote: Reason for CRM: Patient's daughter called regarding the need for a walker.  She requests a call back as she has several questions she would like to discuss regarding bring the patient in for an appointment.  Please call Bernarda [phone]

## 2024-11-10 NOTE — Telephone Encounter (Signed)
 Message left for daughter that patient will need face to face visit if they plan on filing with insurance.  If she calls back please schedule.  For any DME patients must have documentation with in the past 90-days to document the need for request.  Mychart message sent as well.

## 2024-11-10 NOTE — Telephone Encounter (Signed)
 Copied from CRM #[id number]. Topic: General - Other >> Nov 10, 2024  3:44 PM Rosina BIRCH wrote: Reason for RMF:ejupzwu daughter called wanting to know if the provider can put an order in for a walker for the patient. The one with four wheels, a handle and seat [phone]

## 2024-11-11 NOTE — Telephone Encounter (Signed)
 Referral ordered.  They may cover walker - if not we can do a VV

## 2024-11-11 NOTE — Addendum Note (Signed)
 Addended by: GEOFM GLADE PARAS on: 11/11/2024 04:44 PM   Modules accepted: Orders

## 2024-11-12 NOTE — Telephone Encounter (Signed)
 Okay to send in any medication she needs.  We can do a rollator but she will need a virtual visit unfortunately.

## 2024-11-13 NOTE — Telephone Encounter (Signed)
Spoke with daughter today and info given.

## 2024-11-23 ENCOUNTER — Telehealth: Payer: Self-pay

## 2024-11-23 DIAGNOSIS — C7951 Secondary malignant neoplasm of bone: Secondary | ICD-10-CM

## 2024-11-23 DIAGNOSIS — C50212 Malignant neoplasm of upper-inner quadrant of left female breast: Secondary | ICD-10-CM

## 2024-11-23 NOTE — Telephone Encounter (Signed)
 Copied from CRM #[id number]. Topic: General - Inquiry >> Nov 23, 2024  8:31 AM Laymon HERO wrote: Reason for CRM: Bernarda Duke #[phone]- daughter- wanting to find a closer Hospice to [place]- please reach out as soon as possible

## 2024-11-23 NOTE — Telephone Encounter (Signed)
 Spoke with daughter today

## 2024-11-23 NOTE — Telephone Encounter (Signed)
 Copied from CRM [id number]. Topic: General - Other >> Nov 23, 2024  1:45 PM Ahlexyia S wrote: Reason for CRM: Pt daughter Bernarda is calling for an update on locating a hospice for pt that's closer to [place]. Per pt chart there was contact this morning from Alicia but there hasn't been an update just yet. I informed Bernarda with this info but Bernarda is requesting a callback when this has been done to let her know how to proceed with everything.

## 2024-11-24 ENCOUNTER — Other Ambulatory Visit: Payer: Self-pay

## 2024-11-24 ENCOUNTER — Telehealth: Payer: Self-pay

## 2024-11-24 DIAGNOSIS — C7951 Secondary malignant neoplasm of bone: Secondary | ICD-10-CM

## 2024-11-24 DIAGNOSIS — Z170 Estrogen receptor positive status [ER+]: Secondary | ICD-10-CM

## 2024-11-24 NOTE — Telephone Encounter (Signed)
 Received telephone call from [facility] calling from [facility]. Garrel stated patient's Power of Attorney/Daughter contacted him inquiring Hospice care for patient. Garrel has an appointment with the patient and patient's family today @3 :30p. [facility] requesting Hospice referral order from Dr. Lanny today if possible. Let Garrel know that Dr. Lanny is not in the office today and that I would forward his message to Dr. Lanny and her team. Garrel stated he does have access to [facility]'s portal and will be able to see the referral/order when it is placed. Garrel requesting a callback @[phone] to confirm order has been placed.

## 2024-11-24 NOTE — Progress Notes (Signed)
 Verbal order from Powell Lessen, NP for ambulatory referral for Hospice.  Order placed.

## 2024-11-27 NOTE — Addendum Note (Signed)
 Addended by: GEOFM GLADE PARAS on: 11/27/2024 04:45 PM   Modules accepted: Orders

## 2024-12-07 ENCOUNTER — Telehealth: Payer: Self-pay

## 2024-12-07 DIAGNOSIS — C7951 Secondary malignant neoplasm of bone: Secondary | ICD-10-CM

## 2024-12-07 DIAGNOSIS — C50212 Malignant neoplasm of upper-inner quadrant of left female breast: Secondary | ICD-10-CM

## 2024-12-07 NOTE — Telephone Encounter (Signed)
 Copied from CRM #[id number]. Topic: Clinical - Home Health Verbal Orders >> Dec 07, 2024  4:42 PM Delon DASEN wrote: Caller/Agency: Delon with [facility] Callback Number: [phone]  Service Requested: incomplete request Frequency: tbd Any new concerns about the patient? No- not enough information received- fax [phone]

## 2024-12-08 NOTE — Addendum Note (Signed)
 Addended by: GEOFM GLADE PARAS on: 12/08/2024 04:49 PM   Modules accepted: Orders

## 2024-12-08 NOTE — Telephone Encounter (Signed)
 Referral order

## 2024-12-27 DEATH — deceased

## 2025-03-22 ENCOUNTER — Ambulatory Visit: Admitting: Internal Medicine

## 2025-03-24 ENCOUNTER — Ambulatory Visit: Payer: Medicare FFS
